# Patient Record
Sex: Male | Born: 1935 | Race: White | Hispanic: No | Marital: Married | Smoking: Former smoker
Health system: Southern US, Community
[De-identification: ages and names within clinical notes are randomized; demographics above are authoritative.]

## PROBLEM LIST (undated history)

## (undated) DIAGNOSIS — I4891 Unspecified atrial fibrillation: Secondary | ICD-10-CM

## (undated) DIAGNOSIS — C801 Malignant (primary) neoplasm, unspecified: Secondary | ICD-10-CM

## (undated) DIAGNOSIS — I10 Essential (primary) hypertension: Secondary | ICD-10-CM

## (undated) DIAGNOSIS — I499 Cardiac arrhythmia, unspecified: Secondary | ICD-10-CM

## (undated) DIAGNOSIS — K219 Gastro-esophageal reflux disease without esophagitis: Secondary | ICD-10-CM

## (undated) HISTORY — DX: Unspecified atrial fibrillation: I48.91

## (undated) HISTORY — PX: CAROTID ENDARTERECTOMY: SUR193

## (undated) HISTORY — PX: JOINT REPLACEMENT: SHX530

---

## 2004-05-11 ENCOUNTER — Ambulatory Visit: Payer: Self-pay | Admitting: General Surgery

## 2004-07-11 ENCOUNTER — Ambulatory Visit: Payer: Self-pay | Admitting: Unknown Physician Specialty

## 2004-07-13 ENCOUNTER — Ambulatory Visit: Payer: Self-pay | Admitting: Unknown Physician Specialty

## 2006-08-26 ENCOUNTER — Ambulatory Visit: Payer: Self-pay | Admitting: Urology

## 2006-08-29 ENCOUNTER — Ambulatory Visit: Payer: Self-pay | Admitting: Urology

## 2006-09-12 ENCOUNTER — Ambulatory Visit: Payer: Self-pay | Admitting: Radiation Oncology

## 2006-10-08 ENCOUNTER — Ambulatory Visit: Payer: Self-pay | Admitting: Radiation Oncology

## 2006-11-07 ENCOUNTER — Ambulatory Visit: Payer: Self-pay | Admitting: Radiation Oncology

## 2006-12-08 ENCOUNTER — Ambulatory Visit: Payer: Self-pay | Admitting: Radiation Oncology

## 2007-01-07 ENCOUNTER — Ambulatory Visit: Payer: Self-pay | Admitting: Radiation Oncology

## 2007-02-07 ENCOUNTER — Ambulatory Visit: Payer: Self-pay | Admitting: Radiation Oncology

## 2007-05-10 ENCOUNTER — Ambulatory Visit: Payer: Self-pay | Admitting: Radiation Oncology

## 2007-05-19 ENCOUNTER — Ambulatory Visit: Payer: Self-pay | Admitting: Radiation Oncology

## 2007-06-09 ENCOUNTER — Ambulatory Visit: Payer: Self-pay | Admitting: Radiation Oncology

## 2007-07-11 ENCOUNTER — Ambulatory Visit: Payer: Self-pay | Admitting: Radiation Oncology

## 2007-11-07 ENCOUNTER — Ambulatory Visit: Payer: Self-pay | Admitting: Radiation Oncology

## 2007-11-20 ENCOUNTER — Ambulatory Visit: Payer: Self-pay | Admitting: Radiation Oncology

## 2007-12-08 ENCOUNTER — Ambulatory Visit: Payer: Self-pay | Admitting: Radiation Oncology

## 2008-02-07 ENCOUNTER — Ambulatory Visit: Payer: Self-pay | Admitting: Radiation Oncology

## 2009-03-09 ENCOUNTER — Ambulatory Visit: Payer: Self-pay | Admitting: Radiation Oncology

## 2009-07-20 ENCOUNTER — Ambulatory Visit: Payer: Self-pay | Admitting: Unknown Physician Specialty

## 2009-07-21 ENCOUNTER — Inpatient Hospital Stay: Payer: Self-pay | Admitting: Unknown Physician Specialty

## 2011-09-10 ENCOUNTER — Ambulatory Visit: Payer: Self-pay | Admitting: Vascular Surgery

## 2011-09-10 LAB — CREATININE, SERUM
Creatinine: 1.16 mg/dL (ref 0.60–1.30)
EGFR (African American): >60
EGFR (Non-African Amer.): >60

## 2011-10-04 ENCOUNTER — Ambulatory Visit: Payer: Self-pay | Admitting: Vascular Surgery

## 2011-10-04 LAB — BASIC METABOLIC PANEL
BUN: 16 mg/dL (ref 7–18)
Calcium, Total: 8.7 mg/dL (ref 8.5–10.1)
Co2: 26 mmol/L (ref 21–32)
Creatinine: 1.03 mg/dL (ref 0.60–1.30)
EGFR (Non-African Amer.): >60
Glucose: 96 mg/dL (ref 65–99)
Osmolality: 282 (ref 275–301)
Potassium: 3.8 mmol/L (ref 3.5–5.1)

## 2011-10-04 LAB — CBC
HCT: 37.5 % — ABNORMAL LOW (ref 40.0–52.0)
MCH: 33.4 pg (ref 26.0–34.0)
MCV: 99 fL (ref 80–100)
Platelet: 191 10*3/uL (ref 150–440)

## 2011-10-12 ENCOUNTER — Inpatient Hospital Stay: Payer: Self-pay | Admitting: Vascular Surgery

## 2011-10-13 LAB — CBC WITH DIFFERENTIAL/PLATELET
Eosinophil %: 0.7 %
HCT: 29.3 % — ABNORMAL LOW (ref 40.0–52.0)
Lymphocyte %: 12.7 %
MCH: 33.2 pg (ref 26.0–34.0)
Monocyte #: 0.4 10*3/uL (ref 0.0–0.7)
Neutrophil #: 5.8 10*3/uL (ref 1.4–6.5)
Platelet: 152 10*3/uL (ref 150–440)
RBC: 2.95 10*6/uL — ABNORMAL LOW (ref 4.40–5.90)
RDW: 13.9 % (ref 11.5–14.5)
WBC: 7.2 10*3/uL (ref 3.8–10.6)

## 2011-10-13 LAB — BASIC METABOLIC PANEL
Anion Gap: 8 (ref 7–16)
Chloride: 105 mmol/L (ref 98–107)
Co2: 26 mmol/L (ref 21–32)
Creatinine: 1.04 mg/dL (ref 0.60–1.30)
EGFR (Non-African Amer.): >60
Sodium: 139 mmol/L (ref 136–145)

## 2011-10-17 LAB — PATHOLOGY REPORT

## 2011-11-27 ENCOUNTER — Observation Stay: Payer: Self-pay | Admitting: Internal Medicine

## 2011-11-27 LAB — COMPREHENSIVE METABOLIC PANEL
Alkaline Phosphatase: 80 U/L (ref 50–136)
Anion Gap: 9 (ref 7–16)
Calcium, Total: 8.8 mg/dL (ref 8.5–10.1)
Chloride: 105 mmol/L (ref 98–107)
Creatinine: 1.09 mg/dL (ref 0.60–1.30)
EGFR (Non-African Amer.): >60
Glucose: 129 mg/dL — ABNORMAL HIGH (ref 65–99)
Osmolality: 281 (ref 275–301)
Potassium: 3.6 mmol/L (ref 3.5–5.1)
SGOT(AST): 26 U/L (ref 15–37)
SGPT (ALT): 18 U/L
Sodium: 139 mmol/L (ref 136–145)
Total Protein: 7.4 g/dL (ref 6.4–8.2)

## 2011-11-27 LAB — CBC
MCV: 98 fL (ref 80–100)
Platelet: 201 10*3/uL (ref 150–440)
WBC: 5.9 10*3/uL (ref 3.8–10.6)

## 2011-11-27 LAB — CK TOTAL AND CKMB (NOT AT ARMC)
CK, Total: 60 U/L (ref 35–232)
CK-MB: 1.4 ng/mL (ref 0.5–3.6)

## 2011-12-05 ENCOUNTER — Emergency Department: Payer: Self-pay | Admitting: Emergency Medicine

## 2011-12-05 LAB — URINALYSIS, COMPLETE
Bilirubin,UR: NEGATIVE
Glucose,UR: NEGATIVE mg/dL (ref 0–75)
Ketone: NEGATIVE
Leukocyte Esterase: NEGATIVE
RBC,UR: 1 /HPF (ref 0–5)
Squamous Epithelial: NONE SEEN

## 2011-12-05 LAB — COMPREHENSIVE METABOLIC PANEL
Albumin: 3.9 g/dL (ref 3.4–5.0)
Alkaline Phosphatase: 79 U/L (ref 50–136)
Anion Gap: 12 (ref 7–16)
Bilirubin,Total: 0.4 mg/dL (ref 0.2–1.0)
Chloride: 105 mmol/L (ref 98–107)
Co2: 24 mmol/L (ref 21–32)
Creatinine: 1.12 mg/dL (ref 0.60–1.30)
Glucose: 128 mg/dL — ABNORMAL HIGH (ref 65–99)
Potassium: 3.4 mmol/L — ABNORMAL LOW (ref 3.5–5.1)
Sodium: 141 mmol/L (ref 136–145)

## 2011-12-05 LAB — CBC
HCT: 36 % — ABNORMAL LOW (ref 40.0–52.0)
HGB: 12.3 g/dL — ABNORMAL LOW (ref 13.0–18.0)
MCV: 97 fL (ref 80–100)
Platelet: 194 10*3/uL (ref 150–440)
RDW: 13.8 % (ref 11.5–14.5)
WBC: 6.4 10*3/uL (ref 3.8–10.6)

## 2011-12-05 LAB — PROTIME-INR: INR: 0.9

## 2012-02-19 ENCOUNTER — Ambulatory Visit: Payer: Self-pay | Admitting: Vascular Surgery

## 2012-02-19 LAB — BASIC METABOLIC PANEL
BUN: 16 mg/dL (ref 7–18)
Calcium, Total: 8.9 mg/dL (ref 8.5–10.1)
EGFR (African American): >60
Glucose: 95 mg/dL (ref 65–99)
Potassium: 4.2 mmol/L (ref 3.5–5.1)
Sodium: 143 mmol/L (ref 136–145)

## 2012-04-07 ENCOUNTER — Inpatient Hospital Stay: Payer: Self-pay | Admitting: Internal Medicine

## 2012-04-07 LAB — URINALYSIS, COMPLETE
Bacteria: NONE SEEN
Bilirubin,UR: NEGATIVE
Blood: NEGATIVE
Glucose,UR: NEGATIVE mg/dL (ref 0–75)
Ketone: NEGATIVE
Specific Gravity: 1.042 (ref 1.003–1.030)
Squamous Epithelial: NONE SEEN
WBC UR: <1 /HPF (ref 0–5)

## 2012-04-07 LAB — BASIC METABOLIC PANEL
BUN: 23 mg/dL — ABNORMAL HIGH (ref 7–18)
Calcium, Total: 8.8 mg/dL (ref 8.5–10.1)
Chloride: 106 mmol/L (ref 98–107)
Creatinine: 1.25 mg/dL (ref 0.60–1.30)
EGFR (Non-African Amer.): 53 — ABNORMAL LOW
Glucose: 134 mg/dL — ABNORMAL HIGH (ref 65–99)
Osmolality: 287 (ref 275–301)
Potassium: 4.2 mmol/L (ref 3.5–5.1)
Sodium: 141 mmol/L (ref 136–145)

## 2012-04-07 LAB — CBC
HCT: 30.1 % — ABNORMAL LOW (ref 40.0–52.0)
HGB: 10.4 g/dL — ABNORMAL LOW (ref 13.0–18.0)
MCH: 32.7 pg (ref 26.0–34.0)
MCHC: 34.5 g/dL (ref 32.0–36.0)
RBC: 3.18 10*6/uL — ABNORMAL LOW (ref 4.40–5.90)

## 2012-04-07 LAB — TROPONIN I: Troponin-I: <0.02 ng/mL

## 2012-04-07 LAB — PROTIME-INR
INR: 1
Prothrombin Time: 13.4 secs (ref 11.5–14.7)

## 2012-04-08 LAB — CBC WITH DIFFERENTIAL/PLATELET
Basophil #: 0 10*3/uL (ref 0.0–0.1)
Basophil %: 0.3 %
Eosinophil #: 0 10*3/uL (ref 0.0–0.7)
Eosinophil %: 0.7 %
HCT: 23.4 % — ABNORMAL LOW (ref 40.0–52.0)
HGB: 7.9 g/dL — ABNORMAL LOW (ref 13.0–18.0)
Lymphocyte #: 0.9 10*3/uL — ABNORMAL LOW (ref 1.0–3.6)
MCH: 32 pg (ref 26.0–34.0)
MCV: 94 fL (ref 80–100)
Monocyte #: 0.5 x10 3/mm (ref 0.2–1.0)
Monocyte %: 8.7 %
Neutrophil #: 4.4 10*3/uL (ref 1.4–6.5)
RBC: 2.48 10*6/uL — ABNORMAL LOW (ref 4.40–5.90)
RDW: 13.1 % (ref 11.5–14.5)
WBC: 5.9 10*3/uL (ref 3.8–10.6)

## 2012-04-08 LAB — TROPONIN I
Troponin-I: <0.02 ng/mL
Troponin-I: <0.02 ng/mL

## 2012-04-08 LAB — BASIC METABOLIC PANEL
Anion Gap: 6 — ABNORMAL LOW (ref 7–16)
BUN: 17 mg/dL (ref 7–18)
Calcium, Total: 8.5 mg/dL (ref 8.5–10.1)
Co2: 25 mmol/L (ref 21–32)
Creatinine: 1.07 mg/dL (ref 0.60–1.30)
EGFR (African American): >60
EGFR (Non-African Amer.): >60
Osmolality: 283 (ref 275–301)

## 2012-04-09 LAB — CBC WITH DIFFERENTIAL/PLATELET
Basophil #: 0 10*3/uL (ref 0.0–0.1)
Basophil %: 0.4 %
Eosinophil #: 0.1 10*3/uL (ref 0.0–0.7)
HCT: 25.8 % — ABNORMAL LOW (ref 40.0–52.0)
HGB: 9.1 g/dL — ABNORMAL LOW (ref 13.0–18.0)
Lymphocyte %: 18.4 %
MCH: 33 pg (ref 26.0–34.0)
MCHC: 35.1 g/dL (ref 32.0–36.0)
MCV: 94 fL (ref 80–100)
Monocyte #: 0.6 x10 3/mm (ref 0.2–1.0)
Monocyte %: 8.5 %
Neutrophil #: 4.7 10*3/uL (ref 1.4–6.5)
Neutrophil %: 71.4 %
Platelet: 166 10*3/uL (ref 150–440)
RDW: 13.5 % (ref 11.5–14.5)
WBC: 6.5 10*3/uL (ref 3.8–10.6)

## 2014-03-28 ENCOUNTER — Emergency Department: Payer: Self-pay | Admitting: Emergency Medicine

## 2014-03-28 LAB — COMPREHENSIVE METABOLIC PANEL
ALT: 19 U/L
AST: 20 U/L (ref 15–37)
Albumin: 4 g/dL (ref 3.4–5.0)
Alkaline Phosphatase: 74 U/L
Anion Gap: 7 (ref 7–16)
BUN: 15 mg/dL (ref 7–18)
Bilirubin,Total: 0.4 mg/dL (ref 0.2–1.0)
CALCIUM: 8.9 mg/dL (ref 8.5–10.1)
CO2: 24 mmol/L (ref 21–32)
Chloride: 109 mmol/L — ABNORMAL HIGH (ref 98–107)
Creatinine: 1.41 mg/dL — ABNORMAL HIGH (ref 0.60–1.30)
EGFR (African American): 52 — ABNORMAL LOW
GFR CALC NON AF AMER: 45 — AB
Glucose: 102 mg/dL — ABNORMAL HIGH (ref 65–99)
Osmolality: 280 (ref 275–301)
Potassium: 4 mmol/L (ref 3.5–5.1)
SODIUM: 140 mmol/L (ref 136–145)
Total Protein: 7 g/dL (ref 6.4–8.2)

## 2014-03-28 LAB — CBC
HCT: 34.3 % — ABNORMAL LOW (ref 40.0–52.0)
HGB: 11.6 g/dL — ABNORMAL LOW (ref 13.0–18.0)
MCH: 34.5 pg — ABNORMAL HIGH (ref 26.0–34.0)
MCHC: 33.8 g/dL (ref 32.0–36.0)
MCV: 102 fL — AB (ref 80–100)
PLATELETS: 171 10*3/uL (ref 150–440)
RBC: 3.36 10*6/uL — ABNORMAL LOW (ref 4.40–5.90)
RDW: 14.2 % (ref 11.5–14.5)
WBC: 5 10*3/uL (ref 3.8–10.6)

## 2014-03-28 LAB — TROPONIN I: Troponin-I: <0.02 ng/mL

## 2014-10-26 NOTE — Op Note (Signed)
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   PROCEDURES PERFORMED:  1. Abdominal aortogram.  2. Right lower extremity distal runoff.  3. Percutaneous transluminal angioplasty and stent placement, right popliteal artery.  4. Percutaneous transluminal angioplasty and stent placement, right external iliac artery.   SURGEON: Katha Cabal, M.D.   SEDATION: Versed 4 mg plus fentanyl 150 mcg administered IV. Continuous ECG, pulse oximetry and cardiopulmonary monitoring was performed throughout the entire procedure by the interventional radiology nurse. Total sedation time is approximately one hour.   ACCESS: 6 French sheath left common femoral artery.   FLUORO TIME: 6.3 minutes.   CONTRAST USED: Isovue 80 mL.   INDICATIONS: Mr. Craney is an 81 year old gentleman who presented to the office with increasing pain in his right lower extremity. The risks and benefits for angiography and possible intervention were reviewed, all questions answered, and the patient agrees to proceed.   DESCRIPTION OF PROCEDURE: The patient is taken to special procedures and placed in the supine position. After adequate sedation is achieved, the left groin is prepped and draped in sterile fashion. Ultrasound is placed in a sterile sleeve. Ultrasound is utilized secondary to lack of appropriate landmarks to avoid vascular injury. Under direct ultrasound visualization, the left common femoral artery is noted to be pulsatile indicating patency, image is recorded for the permanent record, and micropuncture needle is used to access the anterior wall. Microwire followed by micro sheath, J-wire  followed by 6 French sheath, then 5 French sheath and 5 French pigtail catheter. The pigtail catheter is positioned at T12 and AP projection of the aorta is obtained. Pigtail catheter is repositioned to just above the aortic bifurcation. Bilateral oblique views of the pelvis are obtained. A stiff angled Glidewire is then used to cross the aortic bifurcation and the catheter is advanced down into the SFA. Distal runoff is then obtained.   After review of the images, wire is reintroduced and a 6 [place] Ansel is exchanged for the 5 [place] sheath.  Ansel sheath is then positioned with its tip in the SFA.   Wire is then negotiated across the lesion, in the popliteal, after adequate lateral view has been obtained with the patient in the frog-leg position. A 5 mm balloon is inflated, but there is very little change in this calcific plaque and therefore a 6 x 60 LifeStent is deployed and then post dilated to 5 mm. Result looks significantly improved. Distal runoff is preserved. The sheath is then pulled back and the external iliac lesion noted on the initial images, approximately 60% in the mid external iliac, is treated by deploying an 8 x 4 LIFESTAR stent and post dilating it to 7 mm. Again, the result is  excellent. After review of the images, the sheath is pulled back into the left iliac system, oblique view is obtained, a StarClose device is deployed, and there are no immediate complications.   INTERPRETATION: The aorta and iliac system is patent. The only hemodynamically significant stenosis noted is in the right external iliac and is approximately a 60 to 65% stenosis. Common femoral and profunda femoris on the right are widely patent. Superficial femoral artery is diseased but no hemodynamically significant stenosis is noted. In  the mid popliteal just behind the knee prosthesis there is a focal calcific lesion identified on ultrasound. It is hemodynamically significant, approximately 70 to 80%. Distal runoff  is single vessel via the peroneal. Anterior tibial is patent down about two thirds of the way and quite small.              Following angioplasty and stent placement as described above, there is patency of the popliteal as well as the iliac lesion.   SUMMARY: Successful recanalization of the right lower extremity as described. ____________________________ Katha Cabal, MD ggs:slb D: 02/19/2012 19:57:47 ET T: 02/20/2012 09:15:26 ET JOB#: [id number]  cc: Katha Cabal, MD, <Dictator> Adin Hector, MD Katha Cabal MD ELECTRONICALLY SIGNED 03/04/2012 10:48

## 2014-10-26 NOTE — H&P (Signed)
PATIENT NAME:  [name], KAZMI MR#:  [id number] DATE OF BIRTH:  Sep 09, 1925  DATE OF ADMISSION:  04/07/2012  CHIEF COMPLAINT: Syncope.   HISTORY OF PRESENT ILLNESS: 81 year old male with history of peripheral vascular disease, for which he has been on aspirin and Plavix, also history of hypertension. He was in his usual state of health today until he fell in the shower, he landed on his right side. Over the course of about three hours he began becoming lightheaded, and actually had several episodes of brief syncope when he was trying to stand. He was better once he was lying down. He presented to the office and his initial blood pressures were 90 systolic, falling rapidly to 50 and 60 systolic. Evaluation revealed a very large right-sided hematoma. He was moved to the Emergency Room, and underwent CT scan which confirms large hematoma, fortunately nothing intra-abdominal. His hemoglobin is 10, down from hemoglobin of 12. He is admitted now with syncope which appears to be secondary to acute blood loss anemia secondary to trauma and bleeding related to being on blood thinners. We expect his hemoglobin level likely will fall further overnight as he equilibrates. Fortunately with fluid resuscitation in the Emergency Room his symptoms have improved.   PAST MEDICAL HISTORY:  1. History of chronic anemia and B12 deficiency, baseline hemoglobin approximately 12.  2. Hyperlipidemia.  3. Gastroesophageal reflux disease.  4. History of prostate cancer.  5. Osteoarthritis.  6. Hypertension.  7. Peripheral vascular disease with prior stroke May 2013. Patient with significant right carotid disease, being followed by vascular surgery.  8. History of right knee replacement.  9. History of skin cancer resection.  10. History of arthroscopic surgery.  11. History of left carotid endarterectomy earlier 2013.   ALLERGIES: No known drug allergies.    MEDICATIONS:  1. Multivitamin 1 p.o. daily.  2. Vitamin D3  1000 units p.o. daily.  3. Terazosin 2 mg p.o. at bedtime.  4. Glucosamine 1 p.o. b.i.d.  5. Lovastatin 40 mg p.o. at bedtime.  6. Plavix 75 mg p.o. daily.  7. Aspirin 81 mg p.o. daily.  8. Amlodipine 5 mg p.o. daily. 9. Vitamin B12 1000 mcg p.o. daily.  10. Pantoprazole 40 mg p.o. daily.   SOCIAL HISTORY: No tobacco. One drink of alcohol daily.   FAMILY HISTORY: Stroke, coronary artery disease, intercerebral hemorrhage.   REVIEW OF SYSTEMS: Please see history of present illness. Denies fevers, chills. No chest pain or shortness of breath. No palpitations. Feels generally cold now, but felt better earlier today. Denies focal numbness or weakness. Remainder of complete review of systems is negative.   PHYSICAL EXAMINATION:  VITAL SIGNS: Pulse 59, blood pressure 99/50 initially, saturation 98% on room air, weight 173 pounds.   GENERAL: Elderly male, appears uncomfortable.   EYES: Conjunctivae pale. Pupils are round, reactive to light. Lids pale.   EARS, NOSE, AND THROAT: External examination unremarkable. The oropharynx is slightly dry without lesions.   NECK: Postoperative changes on the left. Trachea midline. No thyromegaly.   CARDIOVASCULAR: Regular rate and rhythm with occasional ectopy. No gallops, rubs, or significant murmurs. Carotid and radial pulses are 2+. Distal pulses 1+.   LUNGS: Clear bilaterally. No wheeze or retraction.   ABDOMEN: Soft, nontender, nondistended. Positive bowel sounds.   BACK: Large hematoma noted on the right flank.   SKIN: As above. No other significant rashes or nodules.   LYMPH NODES: No cervical or supraclavicular nodes.   MUSCULOSKELETAL: No clubbing or cyanosis. No significant edema.  Distal hair loss. Postoperative changes of the right knee.   NEUROLOGIC: Cranial nerves are intact. Gait cannot be tested.   IMPRESSION AND PLAN:  1. Syncope secondary to acute blood loss anemia. Will follow his hemoglobin and aware that he may transfusion  if his hemoglobin gets down below 8.5. His anticoagulation regimen is complicated by the fact that he had fairly recent stents placed in his legs, however, I think at this point I am not sure that we can continue anticoagulation. I will need to talk to vascular surgery for their thoughts. The risks/benefits were discussed with the patient. Type and screen and crossmatch are active.  2. Hypertension, currently hypotensive secondary to the above. Continue fluid resuscitation. Hold all blood pressure medications at this time.  3. Reflux disease. Continue pantoprazole.  4. B12 deficiency. Will hold off on this in the event that he needs to be transfused. Otherwise will resume his B12 supplements.   ____________________________ Adin Hector, MD bjk:cms D: 04/07/2012 19:05:31 ET T: 04/08/2012 06:50:49 ET JOB#: [id number]  cc: Adin Hector, MD, <Dictator>  Ramonita Lab MD ELECTRONICALLY SIGNED 04/13/2012 17:40

## 2014-10-26 NOTE — Discharge Summary (Signed)
PATIENT NAME:  [name], [name] MR#:  [id number] DATE OF BIRTH:  04-24-1926  DATE OF ADMISSION:  04/07/2012 DATE OF DISCHARGE:  04/09/2012  FINAL DIAGNOSES:  1. Syncope secondary to acute blood loss anemia from fall and right side hematoma.  2. Peripheral vascular disease.  3. History of chronic anemia with B12 deficiency.  4. Hyperlipidemia.  5. Gastroesophageal reflux disease.  6. History of prostate cancer.  7. Osteoarthritis.  8. Hypertension.   HISTORY AND PHYSICAL: Please see dictated admission history and physical.  [place]: The patient was admitted after having an episode of syncope and noted to have significant hypotension. Examination revealed right-sided hematoma. He had fallen earlier that day, landing on the ceramic water retention wall in the shower. He has been on aspirin and Plavix. These medications were held, and this was discussed briefly with vascular surgery, as he had had stenting in his legs performed in August, and they felt comfortable having him off his medications, particularly with acute bleeding. Hemoglobin was followed and fell down below 8, and so he was transfused one unit packed cell. His hemoglobin level came up over 9 and remained stable there. His blood pressure stabilized with use of blood products and fluid resuscitation. He was able to ambulate over 300 feet independently, without further syncope or lightheadedness, although he remains mildly orthostatic. He felt ready to go home, and at this time he will be discharged to home in stable condition with his physical activity up as tolerated. His diet should be 2 gram sodium. We will anticipate him following up in the office in 7 to 10 days and will repeat CBC stat at that time.   DISCHARGE MEDICATIONS:  1. Vitamin D 1000 units p.o. daily. 2. Vitamin B12 1000 mcg p.o. daily.  3. Multivitamin 1 p.o. daily.  4. Lovastatin 40 mg p.o. at bedtime.  5. Enteric-coated aspirin 81 mg daily, to be  started on 04/12/2012; risks/benefits were discussed.  6. Pantoprazole 40 mg p.o. daily.  7. Amlodipine 2.5 mg p.o. daily, dose reduced.  8. Iron sulfate 145 mg p.o. daily, over-the-counter, take with food.   He was given instructions to hold Plavix and to hold terazosin. He should avoid heavy lifting.  ____________________________ Adin Hector, MD bjk:slb D: 04/09/2012 12:38:56 ET T: 04/09/2012 12:47:57 ET JOB#: [id number]  cc: Adin Hector, MD, <Dictator> Ramonita Lab MD ELECTRONICALLY SIGNED 04/13/2012 17:40

## 2014-10-31 NOTE — Op Note (Signed)
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  PROCEDURE PERFORMED:  1. Left carotid endarterectomy with CorMatrix patch angioplasty.  2. Repair of arterial defect with xenograft, CorMatrix patch.   SURGEON: Hortencia Pilar, MD   FIRST ASSISTANT: Real Cons, MD   ANESTHESIA: General by endotracheal intubation.   FLUIDS: Per anesthesia record.   ESTIMATED BLOOD LOSS: 100 mL.   SPECIMEN: Carotid plaque to pathology for permanent section.   INDICATIONS: Mr. Geil is an 81 year old gentleman who presented to the office with duplex ultrasound evidence of critical stenosis of the left internal carotid artery. CT angiography was obtained which confirmed approximately 90% stenosis of the left internal carotid artery, and he is therefore undergoing carotid endarterectomy to prevent stroke. The risk and benefits were reviewed. All questions are answered. The patient agrees to proceed.   DESCRIPTION OF PROCEDURE: The patient is taken to the Operating Room and placed in the supine position. After adequate anesthesia is induced and appropriate invasive monitors are placed, he is positioned with his neck extended slightly and rotated to the right. The neck and chest wall are prepped and draped in a sterile fashion.   A curvilinear incision is created along the anterior margin of the sternocleidomastoid muscle, and the dissection is carried down transecting the platysma with Bovie cautery. The external jugular vein is ligated with 3-0 Vicryl. The common carotid artery is identified at the level of the omohyoid, and the dissection is carried in a craniad direction looping the superior thyroidal and the external carotid artery with Silastic vessel loops; 7000 units of heparin is given  and allowed to circulate for four minutes.   The common, followed by the external, followed by the internal carotid artery is clamped. Arteriotomy is made with an 11 blade and extended. An indwelling Sundt shunt is then placed without difficulty, and flow is re-established to the brain. Endarterectomy is then performed in the distal common bulb and proximal internal carotid arteries under direct visualization. The external carotid artery is treated with eversion technique. Interrupted 7-0 Prolene tacking sutures are used for the distal intimal edge within the internal carotid artery, and 6-0 interrupted Prolene sutures are used to tack the proximal intimal edge in the common carotid artery. A CorMatrix patch is rehydrated on the back table, trimmed to an appropriate bevel, and then applied for repair of the arterial defect using running 6-0 Prolene in a four-quadrant technique. The Sundt shunt was removed, and the suture line is completed. Copious irrigation is performed throughout the repair.   Flow is then re-established first to the external carotid artery and then the internal carotid artery to prevent distal embolization. The suture line is inspected for hemostasis.  Surgicel is placed along the suture line; and subsequently the platysma is reapproximated using running 3-0 Vicryl, and the skin is closed with 4-0 Monocryl subcuticular and then Dermabond is applied.   The patient tolerated the procedure well, and there were no immediate complications. He was awakened in the Operating Room, moving all extremities and obeying simple commands. He is taken to the recovery room in stable condition.  ____________________________ Katha Cabal, MD ggs:cbb D: 10/12/2011 15:23:46 ET T: 10/12/2011 18:30:49 ET JOB#: [id number]  cc: Katha Cabal, MD, <Dictator> Adin Hector, MD Katha Cabal MD ELECTRONICALLY SIGNED  10/17/2011 14:08 

## 2014-10-31 NOTE — Discharge Summary (Signed)
PATIENT NAME:  [name], BARRETTA MR#:  [id number] DATE OF BIRTH:  1926-06-24  DATE OF ADMISSION:  11/27/2011 DATE OF DISCHARGE:  11/29/2011  FINAL DIAGNOSES:  1. Transient ischemic attack secondary to right carotid artery stenosis.  2. Bradycardia.  3. Hyperlipidemia.  4. History of prostate cancer.   HISTORY AND PHYSICAL: Please see dictated admission history and physical.   [place]: The patient was admitted with transient episode of left hand numbness. He had two episodes of difficulty with speech that had occurred at different times over the last several weeks. He was seen by neurology, who was concerned that this represented some decreased flow into some areas in the right brain, and the patient had known right carotid artery stenosis and recently had a left carotid endarterectomy. He underwent ultrasound after vascular surgery saw the patient, which suggested significant stenosis in the right carotid artery, subsequently underwent CT angiogram which showed progression of the stenosis from the examination done several months ago to more than 80%. It is felt likely that this is the source of his symptoms and he may require carotid endarterectomy, however, it was felt that given recent bradycardia that was observed during this hospitalization that he would need cardiology to see him prior to surgery. The patient has been asymptomatic since his admission and wanted to go home, which seems reasonable. He will have a 30 day event monitor placed, anticipating him following up with Dr. Serafina Royals within the next one week. Provided no further cardiac evaluation is needed and no new symptoms are found, he will then follow-up with Dr. Hortencia Pilar to anticipate right carotid endarterectomy in the near future. He is discharged home in stable condition with his physical activity up as tolerated. He should follow a 2 gram sodium, low fat diet. His physical activity should be as tolerated.  Follow-up with Dr. Nehemiah Massed and Dr. Delana Meyer, as noted above.   DISCHARGE MEDICATIONS:  1. Vitamin D 1000 units p.o. daily. 2. Vitamin B12 1000 mcg p.o. daily.  3. Multivitamin 1 p.o. daily.  4. Lovastatin 30 mg p.o. at bedtime. 5. Terazosin 2 mg p.o. at bedtime.  6. Clopidogrel 75 mg p.o. daily.  7. Zantac 150 mg p.o. daily as needed.   NOTE: He has instructions to stop omeprazole and to hold aspirin at this time.  ____________________________ Adin Hector, MD bjk:slb D: 11/29/2011 12:59:02 ET T: 11/29/2011 13:08:14 ET JOB#: [id number]  cc: Adin Hector, MD, <Dictator> Corey Skains, MD Katha Cabal, MD Ramonita Lab MD ELECTRONICALLY SIGNED 12/05/2011 7:43

## 2014-10-31 NOTE — Consult Note (Signed)
PATIENT NAME:  [name], [name] MR#:  [id number] DATE OF BIRTH:  08-11-1925  DATE OF CONSULTATION:  10/13/2011  REFERRING PHYSICIAN:  Hortencia Pilar, MD CONSULTING PHYSICIAN:  Isaias Cowman, MD  PRIMARY CARE PHYSICIAN: Ramonita Lab, MD  CHIEF COMPLAINT: I had a blockage in my neck.  HISTORY OF PRESENT ILLNESS: The patient is an 81 year old gentleman who underwent left carotid endarterectomy for 90% stenosis of the left internal coronary artery. The patient had an unremarkable postsurgical course with the exception of episodes of bradycardia on telemetry. Review of telemetry revealed predominant sinus rhythm with first degree AV block with episodic type I second-degree AV block. There was one brief episode of 2: conduction. The patient has remained asymptomatic. He denies a prior history of presyncope or syncope. The patient is not currently taking any AV nodal blocking agents.   PAST MEDICAL HISTORY:  1. Hypertension.  2. Coronary artery disease status post prior myocardial infarction.  3. Hyperlipidemia.  4. Moderately reduced left ventricular function. 5. History of prostate cancer.   MEDICATIONS:  1. Aspirin 81 mg daily.  2. Terazosin 2 mg daily.  3. Lovastatin 80 mg daily.  4. Chondroitin glucosamine 2 tablets daily.  5. Multivitamin 1 daily.  6. Omeprazole 20 mg every other day.   SOCIAL HISTORY: The patient is married and has remote tobacco abuse history.   FAMILY HISTORY: Noncontributory.  REVIEW OF SYSTEMS: CONSTITUTIONAL: No fever or chills. EYES: No blurry vision. EARS: No hearing loss. RESPIRATORY: No shortness of breath. CARDIOVASCULAR: No chest pain, presyncope, or syncope. GASTROINTESTINAL: No nausea, vomiting, diarrhea, or constipation. GU: No dysuria or hematuria. ENDOCRINE: No polyuria or polydipsia. MUSCULOSKELETAL: No arthralgias or myalgias. NEUROLOGICAL: No focal muscle weakness or numbness. PSYCHOLOGICAL: No depression or anxiety.   PHYSICAL EXAMINATION:    VITAL SIGNS: Blood pressure 116/60, pulse 70, respirations 14, pulse oximetry 99%.   HEENT: Pupils equal and reactive to light and accommodation.   NECK: Supple without thyromegaly.   LUNGS: Clear.   HEART: Normal jugular venous pressure. The patient has a surgical scar status post left carotid endarterectomy. Regular rate and rhythm. Normal S1 and S2. No appreciable gallop, murmur, or rub.   ABDOMEN: Soft and nontender without hepatosplenomegaly.   EXTREMITIES: No cyanosis, clubbing, or edema. Pulses were intact bilaterally.   MUSCULOSKELETAL: Normal muscle tone.   NEUROLOGIC: The patient was alert and oriented x3. Motor and sensory both grossly intact.   IMPRESSION: This is an 81 year old gentleman status post successful left carotid endarterectomy with brief episodes of type I second-degree AV block. The patient has remained clinically hemodynamically stable.   RECOMMENDATIONS:  1. Continue present medications.  2. No further cardiac work-up at this time.     3. Follow up with Dr. Nehemiah Massed next week at which time he may consider 24 to 48 hour Holter monitor. ____________________________ Isaias Cowman, MD ap:slb D: 10/13/2011 10:00:30 ET T: 10/13/2011 13:21:29 ET JOB#: [id number]  cc: Isaias Cowman, MD, <Dictator> Isaias Cowman MD ELECTRONICALLY SIGNED 10/24/2011 8:46

## 2014-10-31 NOTE — Consult Note (Signed)
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   Patient was seen by consultant.   Consult note dictated.   Comments: REC  Agree with current therapy, no further w/u at this time, f/u Dr Nehemiah Massed next week, consider 24-48h Holter.  Electronic Signatures: Isaias Cowman (MD)  (Signed 06-Apr-13 10:02)  Authored: Brief Consult Note   Last Updated: 06-Apr-13 10:02 by Isaias Cowman (MD)
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
## 2014-10-31 NOTE — H&P (Signed)
PATIENT NAME:  [name], [name] MR#:  [id number] DATE OF BIRTH:  06/12/1926  DATE OF ADMISSION:  11/27/2011  PRIMARY CARE PHYSICIAN: Tama High, III, MD   CHIEF COMPLAINT: Left arm numbness.   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 The patient was in [place] last Thursday when he had a brief episode of inability to coordinate his words which lasted about 15 to 20 minutes.  No additional symptoms were noted. Today this morning around 11:30, he was at home when he had left hand numbness from his wrist to his fingers which lasted about 15 minutes, so he came here for further evaluation. CT of the head was negative for acute intracranial hemorrhage or cerebrovascular accident. No other neurological deficits are noted.   REVIEW OF SYSTEMS: CONSTITUTIONAL: No fever, fatigue or weakness. EYES: No blurred or double vision.  ENT: No ear pain, hearing loss or seasonal allergies. No postnasal drip. RESPIRATORY: No cough, wheezing, hemoptysis, dyspnea, asthma. CARDIOVASCULAR: No chest pain or palpitations, syncope, orthopnea, edema, arrhythmia, dyspnea on exertion. GASTROINTESTINAL: No nausea, vomiting, diarrhea, abdominal pain, melena, or ulcers. GENITOURINARY: No dysuria or hematuria. ENDOCRINE: Positive nocturia. No heat or cold intolerance. HEMATOLOGIC/LYMPHATIC: No anemia, bleeding or swollen glands. SKIN: No rash or lesions. MUSCULOSKELETAL: No limited activity. NEUROLOGICAL:   No history of cerebrovascular accident, transient ischemic attacks or seizures. PSYCHIATRIC: No history of anxiety or depression.   PAST MEDICAL HISTORY:  1. Hyperlipidemia.  2. Prostate cancer, status post XRT.   MEDICATIONS:  1. Tylenol 500 mg, 1-1/2 tablets at bedtime.  2. Aspirin 81 mg daily.  3. Glucosamine chondroitin b.i.d.  4. Lovastatin 20 mg, 1-1/2  tablets at bedtime.  5. Multivitamin 1 tablet daily. 6. Omeprazole 20 mg, 1/2 tablet every other day in the morning.  7. Terazosin 2 mg daily.  8. Vitamin B12, 1000 mcg daily.  9. Vitamin D 3000 international units daily.   ALLERGIES: No known drug allergies.   PAST SURGICAL HISTORY:  1. Left CEA about six weeks ago with Dr. Delana Meyer.  2. Right knee replacement.   FAMILY HISTORY: Positive for CVA in his father. His mom died at an early age secondary to motor vehicle accident.   SOCIAL HISTORY: He drinks 1 ounce of bourbon a day for health issues. No smoking or IV drug use.   LABORATORY, DIAGNOSTIC AND RADIOLOGICAL DATA:  Sodium 139, potassium 3.6, chloride 105, bicarbonate 25, BUN 18, creatinine 1.09, glucose 129, calcium 8.8, bilirubin 0.3, alkaline phosphatase 80, ALT 18, AST 26, total protein 7.4, albumin 3.9. CK 60, CPK MB 1.4. White blood cells 3.9, hemoglobin 12, hematocrit 36, platelets 201.  Troponin less than 0.02.  CT of the head showed no acute intracranial hemorrhage or cerebrovascular accident.  EKG: Sinus rhythm with a first-degree AV block.   ASSESSMENT AND PLAN: An 80 year old male who had an episode last week of inability to coordinate his words and then again today with some left hand numbness, rule out a transient ischemic attack.   Transient ischemic attack: Symptoms sound like they could be related to a transient ischemic attack. CT of the head is negative. We will order an MRI. He may benefit from an echo at some point. He had a recent CTA, so we will not order Dopplers. The CTA showed 85% occlusion in the left internal carotid artery and about 75%  in the right. I will increase aspirin to 325 mg daily. We will continue with lovastatin and neuro checks every 4 hours. At this time since his symptoms have resolved., we do not need a Physical Therapy consult.   CODE STATUS: The patient is a DO NOT RESUSCITATE status.   TIME SPENT: Approximately 55 minutes.    ____________________________ Donell Beers. Benjie Karvonen, MD spm:cbb D: 11/27/2011 17:47:34 ET T: 11/27/2011 18:07:49 ET JOB#: [id number]  cc: Fatoumata Albaugh P. Benjie Karvonen, MD, <Dictator> Adin Hector, MD Donell Beers Wentworth Edelen MD ELECTRONICALLY SIGNED 11/28/2011 20:48

## 2015-07-13 DIAGNOSIS — I480 Paroxysmal atrial fibrillation: Secondary | ICD-10-CM | POA: Diagnosis not present

## 2015-08-04 DIAGNOSIS — I480 Paroxysmal atrial fibrillation: Secondary | ICD-10-CM | POA: Diagnosis not present

## 2015-08-11 DIAGNOSIS — D519 Vitamin B12 deficiency anemia, unspecified: Secondary | ICD-10-CM | POA: Diagnosis not present

## 2015-08-11 DIAGNOSIS — I10 Essential (primary) hypertension: Secondary | ICD-10-CM | POA: Diagnosis not present

## 2015-08-11 DIAGNOSIS — C61 Malignant neoplasm of prostate: Secondary | ICD-10-CM | POA: Diagnosis not present

## 2015-08-17 DIAGNOSIS — I480 Paroxysmal atrial fibrillation: Secondary | ICD-10-CM | POA: Diagnosis not present

## 2015-08-18 DIAGNOSIS — I482 Chronic atrial fibrillation: Secondary | ICD-10-CM | POA: Diagnosis not present

## 2015-08-18 DIAGNOSIS — D519 Vitamin B12 deficiency anemia, unspecified: Secondary | ICD-10-CM | POA: Diagnosis not present

## 2015-08-18 DIAGNOSIS — I739 Peripheral vascular disease, unspecified: Secondary | ICD-10-CM | POA: Diagnosis not present

## 2015-08-18 DIAGNOSIS — N183 Chronic kidney disease, stage 3 (moderate): Secondary | ICD-10-CM | POA: Diagnosis not present

## 2015-08-18 DIAGNOSIS — K219 Gastro-esophageal reflux disease without esophagitis: Secondary | ICD-10-CM | POA: Diagnosis not present

## 2015-08-18 DIAGNOSIS — I10 Essential (primary) hypertension: Secondary | ICD-10-CM | POA: Diagnosis not present

## 2015-08-18 DIAGNOSIS — E782 Mixed hyperlipidemia: Secondary | ICD-10-CM | POA: Diagnosis not present

## 2015-08-18 DIAGNOSIS — D692 Other nonthrombocytopenic purpura: Secondary | ICD-10-CM | POA: Diagnosis not present

## 2015-08-23 DIAGNOSIS — H2513 Age-related nuclear cataract, bilateral: Secondary | ICD-10-CM | POA: Diagnosis not present

## 2015-09-05 DIAGNOSIS — I4891 Unspecified atrial fibrillation: Secondary | ICD-10-CM | POA: Diagnosis not present

## 2015-09-12 DIAGNOSIS — E785 Hyperlipidemia, unspecified: Secondary | ICD-10-CM | POA: Diagnosis not present

## 2015-09-12 DIAGNOSIS — I6523 Occlusion and stenosis of bilateral carotid arteries: Secondary | ICD-10-CM | POA: Diagnosis not present

## 2015-09-12 DIAGNOSIS — I70213 Atherosclerosis of native arteries of extremities with intermittent claudication, bilateral legs: Secondary | ICD-10-CM | POA: Diagnosis not present

## 2015-09-12 DIAGNOSIS — I10 Essential (primary) hypertension: Secondary | ICD-10-CM | POA: Diagnosis not present

## 2015-09-12 DIAGNOSIS — I739 Peripheral vascular disease, unspecified: Secondary | ICD-10-CM | POA: Diagnosis not present

## 2015-09-12 DIAGNOSIS — I6529 Occlusion and stenosis of unspecified carotid artery: Secondary | ICD-10-CM | POA: Diagnosis not present

## 2015-09-26 DIAGNOSIS — H2513 Age-related nuclear cataract, bilateral: Secondary | ICD-10-CM | POA: Diagnosis not present

## 2015-09-28 ENCOUNTER — Encounter: Payer: Self-pay | Admitting: *Deleted

## 2015-10-03 DIAGNOSIS — I4891 Unspecified atrial fibrillation: Secondary | ICD-10-CM | POA: Diagnosis not present

## 2015-10-11 DIAGNOSIS — I4891 Unspecified atrial fibrillation: Secondary | ICD-10-CM | POA: Diagnosis not present

## 2015-10-11 DIAGNOSIS — I739 Peripheral vascular disease, unspecified: Secondary | ICD-10-CM | POA: Diagnosis not present

## 2015-10-11 DIAGNOSIS — E782 Mixed hyperlipidemia: Secondary | ICD-10-CM | POA: Diagnosis not present

## 2015-10-13 ENCOUNTER — Ambulatory Visit: Payer: PPO | Admitting: Anesthesiology

## 2015-10-13 ENCOUNTER — Encounter
Admission: RE | Disposition: A | Discharge status: Home / Self Care | Payer: Self-pay | Source: Ambulatory Visit | Attending: Ophthalmology

## 2015-10-13 ENCOUNTER — Ambulatory Visit
Admission: RE | Admit: 2015-10-13 | Discharge: 2015-10-13 | Disposition: A | Discharge status: Home / Self Care | Payer: PPO | Source: Ambulatory Visit | Attending: Ophthalmology | Admitting: Ophthalmology

## 2015-10-13 ENCOUNTER — Encounter: Payer: Self-pay | Admitting: *Deleted

## 2015-10-13 DIAGNOSIS — I4891 Unspecified atrial fibrillation: Secondary | ICD-10-CM | POA: Diagnosis not present

## 2015-10-13 DIAGNOSIS — I10 Essential (primary) hypertension: Secondary | ICD-10-CM | POA: Insufficient documentation

## 2015-10-13 DIAGNOSIS — E7800 Pure hypercholesterolemia, unspecified: Secondary | ICD-10-CM | POA: Diagnosis not present

## 2015-10-13 DIAGNOSIS — Z87891 Personal history of nicotine dependence: Secondary | ICD-10-CM | POA: Insufficient documentation

## 2015-10-13 DIAGNOSIS — K219 Gastro-esophageal reflux disease without esophagitis: Secondary | ICD-10-CM | POA: Diagnosis not present

## 2015-10-13 DIAGNOSIS — Z96651 Presence of right artificial knee joint: Secondary | ICD-10-CM | POA: Insufficient documentation

## 2015-10-13 DIAGNOSIS — Z8546 Personal history of malignant neoplasm of prostate: Secondary | ICD-10-CM | POA: Diagnosis not present

## 2015-10-13 DIAGNOSIS — H2513 Age-related nuclear cataract, bilateral: Secondary | ICD-10-CM | POA: Diagnosis not present

## 2015-10-13 DIAGNOSIS — H2511 Age-related nuclear cataract, right eye: Secondary | ICD-10-CM | POA: Diagnosis not present

## 2015-10-13 HISTORY — PX: CATARACT EXTRACTION W/PHACO: SHX586

## 2015-10-13 HISTORY — DX: Malignant (primary) neoplasm, unspecified: C80.1

## 2015-10-13 HISTORY — DX: Cardiac arrhythmia, unspecified: I49.9

## 2015-10-13 HISTORY — DX: Gastro-esophageal reflux disease without esophagitis: K21.9

## 2015-10-13 HISTORY — DX: Essential (primary) hypertension: I10

## 2015-10-13 SURGERY — PHACOEMULSIFICATION, CATARACT, WITH IOL INSERTION
Anesthesia: Monitor Anesthesia Care | Site: Eye | Laterality: Right | Wound class: Clean

## 2015-10-13 MED ORDER — NA HYALUR & NA CHOND-NA HYALUR 0.55-0.5 ML IO KIT
PACK | INTRAOCULAR | Status: AC
  Filled 2015-10-13: qty 1.05

## 2015-10-13 MED ORDER — CEFUROXIME OPHTHALMIC INJECTION 1 MG/0.1 ML
INJECTION | OPHTHALMIC | Status: DC | PRN
  Administered 2015-10-13: 0.1 mL via INTRACAMERAL

## 2015-10-13 MED ORDER — TETRACAINE HCL 0.5 % OP SOLN
OPHTHALMIC | Status: AC
  Administered 2015-10-13: 09:00:00
  Filled 2015-10-13: qty 2

## 2015-10-13 MED ORDER — EPINEPHRINE HCL 1 MG/ML IJ SOLN
INTRAMUSCULAR | Status: AC
  Filled 2015-10-13: qty 2

## 2015-10-13 MED ORDER — CARBACHOL 0.01 % IO SOLN
INTRAOCULAR | Status: DC | PRN
  Administered 2015-10-13: 0.5 mL via INTRAOCULAR

## 2015-10-13 MED ORDER — MOXIFLOXACIN HCL 0.5 % OP SOLN
OPHTHALMIC | Status: AC
  Filled 2015-10-13: qty 3

## 2015-10-13 MED ORDER — CEFUROXIME OPHTHALMIC INJECTION 1 MG/0.1 ML
INJECTION | OPHTHALMIC | Status: AC
  Filled 2015-10-13: qty 0.1

## 2015-10-13 MED ORDER — TETRACAINE HCL 0.5 % OP SOLN
1.0000 [drp] | Freq: Once | OPHTHALMIC | Status: AC
  Administered 2015-10-13: 1 [drp] via OPHTHALMIC

## 2015-10-13 MED ORDER — ARMC OPHTHALMIC DILATING GEL
1.0000 "application " | OPHTHALMIC | Status: DC | PRN
  Administered 2015-10-13 (×2): 1 via OPHTHALMIC

## 2015-10-13 MED ORDER — POVIDONE-IODINE 5 % OP SOLN
1.0000 "application " | Freq: Once | OPHTHALMIC | Status: AC
  Administered 2015-10-13: 1 via OPHTHALMIC

## 2015-10-13 MED ORDER — NEOMYCIN-POLYMYXIN-DEXAMETH 3.5-10000-0.1 OP OINT
TOPICAL_OINTMENT | OPHTHALMIC | Status: AC
  Filled 2015-10-13: qty 3.5

## 2015-10-13 MED ORDER — NA HYALUR & NA CHOND-NA HYALUR 0.4-0.35 ML IO KIT
PACK | INTRAOCULAR | Status: DC | PRN
  Administered 2015-10-13: 1.5 mL via INTRAOCULAR

## 2015-10-13 MED ORDER — POVIDONE-IODINE 5 % OP SOLN
OPHTHALMIC | Status: AC
  Filled 2015-10-13: qty 30

## 2015-10-13 MED ORDER — EPINEPHRINE HCL 1 MG/ML IJ SOLN
INTRAOCULAR | Status: DC | PRN
  Administered 2015-10-13: 250 mL via OPHTHALMIC

## 2015-10-13 MED ORDER — NEOMYCIN-POLYMYXIN-DEXAMETH 0.1 % OP OINT
TOPICAL_OINTMENT | OPHTHALMIC | Status: DC | PRN
  Administered 2015-10-13: 1 via OPHTHALMIC

## 2015-10-13 MED ORDER — ARMC OPHTHALMIC DILATING GEL
OPHTHALMIC | Status: AC
  Filled 2015-10-13: qty 0.25

## 2015-10-13 MED ORDER — LIDOCAINE HCL (PF) 4 % IJ SOLN
INTRAMUSCULAR | Status: DC | PRN
  Administered 2015-10-13: 2 mL via OPHTHALMIC

## 2015-10-13 MED ORDER — LIDOCAINE HCL (PF) 4 % IJ SOLN
INTRAMUSCULAR | Status: AC
  Filled 2015-10-13: qty 5

## 2015-10-13 MED ORDER — SODIUM CHLORIDE 0.9 % IV SOLN
INTRAVENOUS | Status: DC
  Administered 2015-10-13: 09:00:00 via INTRAVENOUS

## 2015-10-13 MED ORDER — MIDAZOLAM HCL 2 MG/2ML IJ SOLN
INTRAMUSCULAR | Status: DC | PRN
  Administered 2015-10-13 (×2): 1 mg via INTRAVENOUS

## 2015-10-13 SURGICAL SUPPLY — 23 items
CANNULA ANT/CHMB 27GA (MISCELLANEOUS) ×3 IMPLANT
CUP MEDICINE 2OZ PLAST GRAD ST (MISCELLANEOUS) ×3 IMPLANT
GLOVE BIO SURGEON STRL SZ8 (GLOVE) ×3 IMPLANT
GLOVE BIOGEL M 6.5 STRL (GLOVE) ×3 IMPLANT
GLOVE SURG LX 7.5 STRW (GLOVE) ×2
GLOVE SURG LX STRL 7.5 STRW (GLOVE) ×1 IMPLANT
GOWN STRL REUS W/ TWL LRG LVL3 (GOWN DISPOSABLE) ×2 IMPLANT
GOWN STRL REUS W/TWL LRG LVL3 (GOWN DISPOSABLE) ×4
LENS IOL TECNIS TRC I 225 22.5 (Intraocular Lens) ×1 IMPLANT
LENS IOL TORIC 22.5 (Intraocular Lens) ×2 IMPLANT
LENS IOL TORIC 225 22.5 (Intraocular Lens) ×1 IMPLANT
PACK CATARACT (MISCELLANEOUS) ×3 IMPLANT
PACK CATARACT BRASINGTON LX (MISCELLANEOUS) ×3 IMPLANT
PACK EYE AFTER SURG (MISCELLANEOUS) ×3 IMPLANT
SOL BSS BAG (MISCELLANEOUS) ×3
SOL PREP PVP 2OZ (MISCELLANEOUS) ×3
SOLUTION BSS BAG (MISCELLANEOUS) ×1 IMPLANT
SOLUTION PREP PVP 2OZ (MISCELLANEOUS) ×1 IMPLANT
SYR 3ML LL SCALE MARK (SYRINGE) ×3 IMPLANT
SYR 5ML LL (SYRINGE) ×3 IMPLANT
SYR TB 1ML 27GX1/2 LL (SYRINGE) ×3 IMPLANT
WATER STERILE IRR 1000ML POUR (IV SOLUTION) ×3 IMPLANT
WIPE NON LINTING 3.25X3.25 (MISCELLANEOUS) ×3 IMPLANT

## 2015-10-13 NOTE — Anesthesia Preprocedure Evaluation (Signed)
Anesthesia Evaluation  Patient identified by MRN, date of birth, ID band Patient awake    Reviewed: Allergy & Precautions, NPO status , Patient's Chart, lab work & pertinent test results  Airway Mallampati: II       Dental  (+) Teeth Intact   Pulmonary former smoker,    breath sounds clear to auscultation       Cardiovascular Exercise Tolerance: Good hypertension, Pt. on medications + dysrhythmias  Rhythm:Regular Rate:Normal     Neuro/Psych    GI/Hepatic Neg liver ROS, GERD  ,  Endo/Other  negative endocrine ROS  Renal/GU negative Renal ROS     Musculoskeletal   Abdominal Normal abdominal exam  (+)   Peds  Hematology   Anesthesia Other Findings   Reproductive/Obstetrics                             Anesthesia Physical Anesthesia Plan  ASA: II  Anesthesia Plan: MAC   Post-op Pain Management:    Induction: Intravenous  Airway Management Planned: Natural Airway and Nasal Cannula  Additional Equipment:   Intra-op Plan:   Post-operative Plan:   Informed Consent: I have reviewed the patients History and Physical, chart, labs and discussed the procedure including the risks, benefits and alternatives for the proposed anesthesia with the patient or authorized representative who has indicated his/her understanding and acceptance.     Plan Discussed with: CRNA  Anesthesia Plan Comments:         Anesthesia Quick Evaluation

## 2015-10-13 NOTE — Op Note (Signed)
LOCATION:  [place]   PREOPERATIVE DIAGNOSIS:  Nuclear sclerotic cataract of the right eye.  H25.11   POSTOPERATIVE DIAGNOSIS:  Nuclear sclerotic cataract of the right eye.   PROCEDURE:  Phacoemulsification with Toric posterior chamber intraocular lens placement of the right eye.   LENS:   Implant Name Type Inv. Item Serial No. Manufacturer Lot No. LRB No. Used  LENS IOL TORIC 22.5 - [id number]  Intraocular Lens LENS IOL TORIC 22.5 [id number]  AMO [id number] Right 1     [id number] 22.0D Toric intraocular lens with 2.25 diopters of cylindrical power with axis orientation at 162 degrees.   ULTRASOUND TIME: 17 % of 1 minutes, 23 seconds.  CDE 14.3   SURGEON:  Wyonia Hough, MD   ANESTHESIA: Topical with tetracaine drops and 2% Xylocaine jelly, augmented with 1% preservative-free intracameral lidocaine. .   COMPLICATIONS:  None.   DESCRIPTION OF PROCEDURE:  The patient was identified in the holding room and transported to the operating suite and placed in the supine position under the operating microscope.  The right eye was identified as the operative eye, and it was prepped and draped in the usual sterile ophthalmic fashion.    A clear-corneal paracentesis incision was made at the 12:00 position.  0.5 ml of preservative-free 1% lidocaine was injected into the anterior chamber. The anterior chamber was filled with Viscoat.  A 2.4 millimeter near clear corneal incision was then made at the 9:00 position.  A cystotome and capsulorrhexis forceps were then used to make a curvilinear capsulorrhexis.  Hydrodissection and hydrodelineation were then performed using balanced salt solution.   Phacoemulsification was then used in stop and chop fashion to remove the lens, nucleus and epinucleus.  The remaining cortex was aspirated using the irrigation and aspiration handpiece.  Provisc viscoelastic was then placed into the capsular bag to distend it for lens placement.  The Verion  digital marker was used to align the implant at the intended axis.   A Toric lens was then injected into the capsular bag.  It was rotated clockwise until the axis marks on the lens were approximately 15 degrees in the counterclockwise direction to the intended alignment.  The viscoelastic was aspirated from the eye using the irrigation aspiration handpiece.  Then, a Koch spatula through the sideport incision was used to rotate the lens in a clockwise direction until the axis markings of the intraocular lens were lined up with the Verion alignment.  Balanced salt solution was then used to hydrate the wounds.  Wounds were hydrated with balanced salt solution.  The anterior chamber was inflated to a physiologic pressure with balanced salt solution. Cefuroxime 0.1 ml of a 10mg /ml solution was injected into the anterior chamber for a dose of 1 mg of intracameral antibiotic at the completion of the case. Miostat was placed into the anterior chamber to constrict the pupil.  No wound leaks were noted.  Topical Vigamox drops and Maxitrol ointment were applied to the eye.  The patient was taken to the recovery room in stable condition without complications of anesthesia or surgery.  Lekha Dancer 10/13/2015, 10:31 AM

## 2015-10-13 NOTE — Anesthesia Postprocedure Evaluation (Signed)
Anesthesia Post Note  Patient: [name]  Procedure(s) Performed: Procedure(s) (LRB): CATARACT EXTRACTION PHACO AND INTRAOCULAR LENS PLACEMENT (IOC) (Right)  Patient location during evaluation: PACU Anesthesia Type: MAC Level of consciousness: awake Pain management: pain level controlled Vital Signs Assessment: post-procedure vital signs reviewed and stable Respiratory status: spontaneous breathing Cardiovascular status: blood pressure returned to baseline Anesthetic complications: no    Last Vitals:  Filed Vitals:   10/13/15 1032 10/13/15 1035  BP: 150/87 133/69  Pulse: 82   Temp: 36.4 C   Resp: 16     Last Pain: There were no vitals filed for this visit.               VAN STAVEREN,Latara Micheli

## 2015-10-13 NOTE — Discharge Instructions (Signed)
AMBULATORY SURGERY  DISCHARGE INSTRUCTIONS   1) The drugs that you were given will stay in your system until tomorrow so for the next 24 hours you should not:  A) Drive an automobile B) Make any legal decisions C) Drink any alcoholic beverage   2) You may resume regular meals tomorrow.  Today it is better to start with liquids and gradually work up to solid foods.  You may eat anything you prefer, but it is better to start with liquids, then soup and crackers, and gradually work up to solid foods.   3) Please notify your doctor immediately if you have any unusual bleeding, trouble breathing, redness and pain at the surgery site, drainage, fever, or pain not relieved by medication.    4) Additional Instructions:    Eye Surgery Discharge Instructions  Expect mild scratchy sensation or mild soreness. DO NOT RUB YOUR EYE!  The day of surgery:  Minimal physical activity, but bed rest is not required  No reading, computer work, or close hand work  No bending, lifting, or straining.  May watch TV  For 24 hours:  No driving, legal decisions, or alcoholic beverages  Safety precautions  Eat anything you prefer: It is better to start with liquids, then soup then solid foods.  _____ Eye patch should be worn until postoperative exam tomorrow.  ____ Solar shield eyeglasses should be worn for comfort in the sunlight/patch while sleeping  Resume all regular medications including aspirin or Coumadin if these were discontinued prior to surgery. You may shower, bathe, shave, or wash your hair. Tylenol may be taken for mild discomfort.  Call your doctor if you experience significant pain, nausea, or vomiting, fever > 101 or other signs of infection. [phone] or [phone] Specific instructions:  Follow-up Information    Follow up with Leandrew Koyanagi, MD.   Specialty:  Ophthalmology   Why:  April 7 at 10:55am   Contact information:   [address]   [place] [address] [phone]         Please contact your physician with any problems or Same Day Surgery at [phone], Monday through Friday 6 am to 4 pm, or [place] at [facility] number at [phone].

## 2015-10-13 NOTE — H&P (Signed)
  The History and Physical notes are on paper, have been signed, and are to be scanned. The patient remains stable and unchanged from the H&P.   Previous H&P reviewed, patient examined, and there are no changes.  [name] 10/13/2015 9:56 AM

## 2015-10-13 NOTE — Transfer of Care (Signed)
Immediate Anesthesia Transfer of Care Note  Patient: [name]  Procedure(s) Performed: Procedure(s) with comments: CATARACT EXTRACTION PHACO AND INTRAOCULAR LENS PLACEMENT (IOC) (Right) - Lot # [id number] H US:01:23.3 AP:17.2 CDE:14.31  Patient Location: PACU and Short Stay  Anesthesia Type:MAC  Level of Consciousness: awake and patient cooperative  Airway & Oxygen Therapy: Patient Spontanous Breathing  Post-op Assessment: Report given to RN and Post -op Vital signs reviewed and stable  Post vital signs: Reviewed and stable  Last Vitals:  Filed Vitals:   10/13/15 0827  BP: 140/92  Pulse: 85  Temp: 36.4 C  Resp: 16    Complications: No apparent anesthesia complications

## 2015-12-06 DIAGNOSIS — I4891 Unspecified atrial fibrillation: Secondary | ICD-10-CM | POA: Diagnosis not present

## 2015-12-06 DIAGNOSIS — H2512 Age-related nuclear cataract, left eye: Secondary | ICD-10-CM | POA: Diagnosis not present

## 2015-12-07 ENCOUNTER — Encounter: Payer: Self-pay | Admitting: *Deleted

## 2015-12-12 ENCOUNTER — Encounter
Admission: RE | Disposition: A | Discharge status: Home / Self Care | Payer: Self-pay | Source: Ambulatory Visit | Attending: Ophthalmology

## 2015-12-12 ENCOUNTER — Encounter: Payer: Self-pay | Admitting: *Deleted

## 2015-12-12 ENCOUNTER — Ambulatory Visit: Payer: PPO | Admitting: Anesthesiology

## 2015-12-12 ENCOUNTER — Ambulatory Visit
Admission: RE | Admit: 2015-12-12 | Discharge: 2015-12-12 | Disposition: A | Discharge status: Home / Self Care | Payer: PPO | Source: Ambulatory Visit | Attending: Ophthalmology | Admitting: Ophthalmology

## 2015-12-12 DIAGNOSIS — I10 Essential (primary) hypertension: Secondary | ICD-10-CM | POA: Diagnosis not present

## 2015-12-12 DIAGNOSIS — Z96651 Presence of right artificial knee joint: Secondary | ICD-10-CM | POA: Diagnosis not present

## 2015-12-12 DIAGNOSIS — I4891 Unspecified atrial fibrillation: Secondary | ICD-10-CM | POA: Insufficient documentation

## 2015-12-12 DIAGNOSIS — Z8546 Personal history of malignant neoplasm of prostate: Secondary | ICD-10-CM | POA: Diagnosis not present

## 2015-12-12 DIAGNOSIS — H2512 Age-related nuclear cataract, left eye: Secondary | ICD-10-CM | POA: Diagnosis not present

## 2015-12-12 DIAGNOSIS — Z87891 Personal history of nicotine dependence: Secondary | ICD-10-CM | POA: Diagnosis not present

## 2015-12-12 DIAGNOSIS — E7800 Pure hypercholesterolemia, unspecified: Secondary | ICD-10-CM | POA: Insufficient documentation

## 2015-12-12 DIAGNOSIS — Z7901 Long term (current) use of anticoagulants: Secondary | ICD-10-CM | POA: Insufficient documentation

## 2015-12-12 DIAGNOSIS — Z9889 Other specified postprocedural states: Secondary | ICD-10-CM | POA: Insufficient documentation

## 2015-12-12 DIAGNOSIS — Z79899 Other long term (current) drug therapy: Secondary | ICD-10-CM | POA: Insufficient documentation

## 2015-12-12 DIAGNOSIS — H269 Unspecified cataract: Secondary | ICD-10-CM | POA: Diagnosis not present

## 2015-12-12 DIAGNOSIS — Z9841 Cataract extraction status, right eye: Secondary | ICD-10-CM | POA: Insufficient documentation

## 2015-12-12 DIAGNOSIS — K219 Gastro-esophageal reflux disease without esophagitis: Secondary | ICD-10-CM | POA: Diagnosis not present

## 2015-12-12 DIAGNOSIS — Z9079 Acquired absence of other genital organ(s): Secondary | ICD-10-CM | POA: Diagnosis not present

## 2015-12-12 HISTORY — PX: CATARACT EXTRACTION W/PHACO: SHX586

## 2015-12-12 SURGERY — PHACOEMULSIFICATION, CATARACT, WITH IOL INSERTION
Anesthesia: Monitor Anesthesia Care | Site: Eye | Laterality: Left | Wound class: Clean

## 2015-12-12 MED ORDER — NEOMYCIN-POLYMYXIN-DEXAMETH 0.1 % OP OINT
TOPICAL_OINTMENT | OPHTHALMIC | Status: DC | PRN
  Administered 2015-12-12: 1 via OPHTHALMIC

## 2015-12-12 MED ORDER — FENTANYL CITRATE (PF) 100 MCG/2ML IJ SOLN
INTRAMUSCULAR | Status: DC | PRN
  Administered 2015-12-12: 50 ug via INTRAVENOUS

## 2015-12-12 MED ORDER — MIDAZOLAM HCL 2 MG/2ML IJ SOLN
INTRAMUSCULAR | Status: DC | PRN
  Administered 2015-12-12: 1 mg via INTRAVENOUS

## 2015-12-12 MED ORDER — CEFUROXIME OPHTHALMIC INJECTION 1 MG/0.1 ML
INJECTION | OPHTHALMIC | Status: DC | PRN
  Administered 2015-12-12: 0.1 mL via INTRACAMERAL

## 2015-12-12 MED ORDER — CARBACHOL 0.01 % IO SOLN
INTRAOCULAR | Status: DC | PRN
  Administered 2015-12-12: 0.5 mL via INTRAOCULAR

## 2015-12-12 MED ORDER — ARMC OPHTHALMIC DILATING GEL
1.0000 "application " | OPHTHALMIC | Status: AC | PRN
  Administered 2015-12-12 (×2): 1 via OPHTHALMIC

## 2015-12-12 MED ORDER — NA HYALUR & NA CHOND-NA HYALUR 0.55-0.5 ML IO KIT
PACK | INTRAOCULAR | Status: AC
  Filled 2015-12-12: qty 1.05

## 2015-12-12 MED ORDER — NEOMYCIN-POLYMYXIN-DEXAMETH 3.5-10000-0.1 OP OINT
TOPICAL_OINTMENT | OPHTHALMIC | Status: AC
  Filled 2015-12-12: qty 3.5

## 2015-12-12 MED ORDER — SODIUM CHLORIDE 0.9 % IV SOLN
INTRAVENOUS | Status: DC
  Administered 2015-12-12: 08:00:00 via INTRAVENOUS

## 2015-12-12 MED ORDER — EPINEPHRINE HCL 1 MG/ML IJ SOLN
INTRAOCULAR | Status: DC | PRN
  Administered 2015-12-12: 10:00:00 via OPHTHALMIC

## 2015-12-12 MED ORDER — EPINEPHRINE HCL 1 MG/ML IJ SOLN
INTRAMUSCULAR | Status: AC
  Filled 2015-12-12: qty 1

## 2015-12-12 MED ORDER — LIDOCAINE HCL (PF) 4 % IJ SOLN
INTRAMUSCULAR | Status: AC
  Filled 2015-12-12: qty 5

## 2015-12-12 MED ORDER — MOXIFLOXACIN HCL 0.5 % OP SOLN: 1.0000 [drp] | OPHTHALMIC | Status: DC | PRN

## 2015-12-12 MED ORDER — NA CHONDROIT SULF-NA HYALURON 40-30 MG/ML IO SOLN
INTRAOCULAR | Status: DC | PRN
  Administered 2015-12-12: 0.5 mL via INTRAOCULAR

## 2015-12-12 MED ORDER — CEFUROXIME OPHTHALMIC INJECTION 1 MG/0.1 ML
INJECTION | OPHTHALMIC | Status: AC
  Filled 2015-12-12: qty 0.1

## 2015-12-12 MED ORDER — POVIDONE-IODINE 5 % OP SOLN
1.0000 "application " | Freq: Once | OPHTHALMIC | Status: AC
  Administered 2015-12-12: 1 via OPHTHALMIC

## 2015-12-12 MED ORDER — LIDOCAINE HCL (PF) 4 % IJ SOLN
INTRAOCULAR | Status: DC | PRN
  Administered 2015-12-12: 10:00:00 via OPHTHALMIC

## 2015-12-12 MED ORDER — TETRACAINE HCL 0.5 % OP SOLN
1.0000 [drp] | Freq: Once | OPHTHALMIC | Status: AC
  Administered 2015-12-12: 1 [drp] via OPHTHALMIC

## 2015-12-12 MED ORDER — NA HYALUR & NA CHOND-NA HYALUR 0.4-0.35 ML IO KIT
PACK | INTRAOCULAR | Status: DC | PRN
  Administered 2015-12-12: .4 mL via INTRAOCULAR

## 2015-12-12 SURGICAL SUPPLY — 23 items
CANNULA ANT/CHMB 27GA (MISCELLANEOUS) ×3 IMPLANT
CUP MEDICINE 2OZ PLAST GRAD ST (MISCELLANEOUS) ×3 IMPLANT
GLOVE BIO SURGEON STRL SZ8 (GLOVE) ×3 IMPLANT
GLOVE BIOGEL M 6.5 STRL (GLOVE) ×3 IMPLANT
GLOVE SURG LX 7.5 STRW (GLOVE) ×2
GLOVE SURG LX STRL 7.5 STRW (GLOVE) ×1 IMPLANT
GOWN STRL REUS W/ TWL LRG LVL3 (GOWN DISPOSABLE) ×2 IMPLANT
GOWN STRL REUS W/TWL LRG LVL3 (GOWN DISPOSABLE) ×4
LENS IOL TECNIS TRC I 150 22.5 ×1 IMPLANT
LENS IOL TORIC 150 22.5 ×1 IMPLANT
LENS IOL TORIC 22.5 ×2 IMPLANT
PACK CATARACT (MISCELLANEOUS) ×3 IMPLANT
PACK CATARACT BRASINGTON LX (MISCELLANEOUS) ×3 IMPLANT
PACK EYE AFTER SURG (MISCELLANEOUS) ×3 IMPLANT
SOL BSS BAG (MISCELLANEOUS) ×3
SOL PREP PVP 2OZ (MISCELLANEOUS) ×3
SOLUTION BSS BAG (MISCELLANEOUS) ×1 IMPLANT
SOLUTION PREP PVP 2OZ (MISCELLANEOUS) ×1 IMPLANT
SYR 3ML LL SCALE MARK (SYRINGE) ×3 IMPLANT
SYR 5ML LL (SYRINGE) ×3 IMPLANT
SYR TB 1ML 27GX1/2 LL (SYRINGE) ×3 IMPLANT
WATER STERILE IRR 1000ML POUR (IV SOLUTION) ×3 IMPLANT
WIPE NON LINTING 3.25X3.25 (MISCELLANEOUS) ×3 IMPLANT

## 2015-12-12 NOTE — H&P (Signed)
  The History and Physical notes are on paper, have been signed, and are to be scanned. The patient remains stable and unchanged from the H&P.   Previous H&P reviewed, patient examined, and there are no changes.  Narek Kniss 12/12/2015 9:02 AM

## 2015-12-12 NOTE — Transfer of Care (Signed)
Immediate Anesthesia Transfer of Care Note  Patient: [name]  Procedure(s) Performed: Procedure(s) with comments: CATARACT EXTRACTION PHACO AND INTRAOCULAR LENS PLACEMENT (IOC) (Left) - [place] 1.10 AP% 15.9 CDE 11.22 FLUID PACK LOT # [id number]  Patient Location: PACU  Anesthesia Type:MAC  Level of Consciousness: awake, alert  and oriented  Airway & Oxygen Therapy: Patient Spontanous Breathing  Post-op Assessment: Report given to RN  Post vital signs: Reviewed and stable  Last Vitals:  Filed Vitals:   12/12/15 0820 12/12/15 0955  BP: 148/63 125/67  Pulse: 81 58  Temp: 35.2 C 37 C  Resp: 18 20    Last Pain: There were no vitals filed for this visit.       Complications: No apparent anesthesia complications

## 2015-12-12 NOTE — Anesthesia Postprocedure Evaluation (Signed)
Anesthesia Post Note  Patient: [name]  Procedure(s) Performed: Procedure(s) (LRB): CATARACT EXTRACTION PHACO AND INTRAOCULAR LENS PLACEMENT (IOC) (Left)  Patient location during evaluation: Other Anesthesia Type: General Level of consciousness: awake and alert Pain management: pain level controlled Vital Signs Assessment: post-procedure vital signs reviewed and stable Respiratory status: spontaneous breathing, nonlabored ventilation, respiratory function stable and patient connected to nasal cannula oxygen Cardiovascular status: blood pressure returned to baseline and stable Postop Assessment: no signs of nausea or vomiting Anesthetic complications: no    Last Vitals:  Filed Vitals:   12/12/15 0955 12/12/15 1006  BP: 125/67 127/59  Pulse: 58 77  Temp: 37 C 35.7 C  Resp: 20 20    Last Pain: There were no vitals filed for this visit.               Adrian Dinovo S

## 2015-12-12 NOTE — Op Note (Signed)
  PREOPERATIVE DIAGNOSIS:  Nuclear sclerotic cataract of the left eye.  H25.12  POSTOPERATIVE DIAGNOSIS:  Nuclear sclerotic cataract of the left eye.   PROCEDURE:  Phacoemulsification with Toric posterior chamber intraocular lens placement of the left eye.   LENS:  Implant Name Type Inv. Item Serial No. Manufacturer Lot No. LRB No. Used  LENS IOL TORIC 22.5 - [id number]   LENS IOL TORIC 22.5 [id number]   Left 1   Toric intraocular lens with 1.50 diopters of cylindrical power with axis orientation at 12 degrees.   ULTRASOUND TIME: 16 % of 1 minutes, 10 seconds.  CDE 11.2   SURGEON:  Wyonia Hough, MD   ANESTHESIA:  Topical with tetracaine drops and 2% Xylocaine jelly, augmented with 1% preservative-free intracameral lidocaine.  COMPLICATIONS:  None.   DESCRIPTION OF PROCEDURE:  The patient was identified in the holding room and transported to the operating suite and placed in the supine position under the operating microscope.  The left eye was identified as the operative eye, and it was prepped and draped in the usual sterile ophthalmic fashion.    A clear-corneal paracentesis incision was made at the 1:30 position.  0.5 ml of preservative-free 1% lidocaine was injected into the anterior chamber. The anterior chamber was filled with Viscoat.  A 2.4 millimeter near clear corneal incision was then made at the 10:30 position.  A cystotome and capsulorrhexis forceps were then used to make a curvilinear capsulorrhexis.  Hydrodissection and hydrodelineation were then performed using balanced salt solution.   Phacoemulsification was then used in stop and chop fashion to remove the lens, nucleus and epinucleus.  The remaining cortex was aspirated using the irrigation and aspiration handpiece.  Provisc viscoelastic was then placed into the capsular bag to distend it for lens placement.  The Verion digital marker was used to align the implant at the intended axis.   A 22.5 diopter lens  was then injected into the capsular bag.  It was rotated clockwise until the axis marks on the lens were approximately 15 degrees in the counterclockwise direction to the intended alignment.  The viscoelastic was aspirated from the eye using the irrigation aspiration handpiece.  Then, a Koch spatula through the sideport incision was used to rotate the lens in a clockwise direction until the axis markings of the intraocular lens were lined up with the Verion alignment.  Balanced salt solution was then used to hydrate the wounds.  Miostat was placed into the anterior chamber.   Cefuroxime 0.1 ml of a 10mg /ml solution was injected into the anterior chamber for a dose of 1 mg of intracameral antibiotic at the completion of the case.    The eye was noted to have a physiologic pressure and there was no wound leak noted.   Vigamox drops and Maxitrol ointment were applied to the eye.  The patient was taken to the recovery room in stable condition having had no complications of anesthesia or surgery.  Donovan Gatchel 12/12/2015, 9:59 AM

## 2015-12-12 NOTE — Discharge Instructions (Signed)
Follow eye drop schedule ° °Cataract Surgery, Care After °Refer to this sheet in the next few weeks. These instructions provide you with information on caring for yourself after your procedure. Your caregiver may also give you more specific instructions. Your treatment has been planned according to current medical practices, but problems sometimes occur. Call your caregiver if you have any problems or questions after your procedure.  °HOME CARE INSTRUCTIONS  °· Avoid strenuous activities as directed by your caregiver. °· Ask your caregiver when you can resume driving. °· Use eyedrops or other medicines to help healing and control pressure inside your eye as directed by your caregiver. °· Only take over-the-counter or prescription medicines for pain, discomfort, or fever as directed by your caregiver. °· Do not to touch or rub your eyes. °· You may be instructed to use a protective shield during the first few days and nights after surgery. If not, wear sunglasses to protect your eyes. This is to protect the eye from pressure or from being accidentally bumped. °· Keep the area around your eye clean and dry. Avoid swimming or allowing water to hit you directly in the face while showering. Keep soap and shampoo out of your eyes. °· Do not bend or lift heavy objects. Bending increases pressure in the eye. You can walk, climb stairs, and do light household chores. °· Do not put a contact lens into the eye that had surgery until your caregiver says it is okay to do so. °· Ask your doctor when you can return to work. This will depend on the kind of work that you do. If you work in a dusty environment, you may be advised to wear protective eyewear for a period of time. °· Ask your caregiver when it will be safe to engage in sexual activity. °· Continue with your regular eye exams as directed by your caregiver. °What to expect: °· It is normal to feel itching and mild discomfort for a few days after cataract surgery. Some  fluid discharge is also common, and your eye may be sensitive to light and touch. °· After 1 to 2 days, even moderate discomfort should disappear. In most cases, healing will take about 6 weeks. °· If you received an intraocular lens (IOL), you may notice that colors are very bright or have a blue tinge. Also, if you have been in bright sunlight, everything may appear reddish for a few hours. If you see these color tinges, it is because your lens is clear and no longer cloudy. Within a few months after receiving an IOL, these extra colors should go away. When you have healed, you will probably need new glasses. °SEEK MEDICAL CARE IF:  °· You have increased bruising around your eye. °· You have discomfort not helped by medicine. °SEEK IMMEDIATE MEDICAL CARE IF:  °· You have a  fever. °· You have a worsening or sudden vision loss. °· You have redness, swelling, or increasing pain in the eye. °· You have a thick discharge from the eye that had surgery. °MAKE SURE YOU: °· Understand these instructions. °· Will watch your condition. °· Will get help right away if you are not doing well or get worse. °  °This information is not intended to replace advice given to you by your health care provider. Make sure you discuss any questions you have with your health care provider. °  °Document Released: 01/12/2005 Document Revised: 07/16/2014 Document Reviewed: 02/16/2011 °Elsevier Interactive Patient Education ©2016 [employer] ° °

## 2015-12-12 NOTE — Anesthesia Preprocedure Evaluation (Addendum)
Anesthesia Evaluation  Patient identified by MRN, date of birth, ID band Patient awake    Reviewed: Allergy & Precautions, NPO status , Patient's Chart, lab work & pertinent test results, reviewed documented beta blocker date and time   Airway Mallampati: II  TM Distance: >3 FB     Dental  (+) Chipped, Partial Upper   Pulmonary former smoker,           Cardiovascular hypertension, Pt. on medications + dysrhythmias      Neuro/Psych    GI/Hepatic GERD  Controlled,  Endo/Other    Renal/GU      Musculoskeletal   Abdominal   Peds  Hematology   Anesthesia Other Findings   Reproductive/Obstetrics                            Anesthesia Physical Anesthesia Plan  ASA: III  Anesthesia Plan: MAC   Post-op Pain Management:    Induction:   Airway Management Planned:   Additional Equipment:   Intra-op Plan:   Post-operative Plan:   Informed Consent: I have reviewed the patients History and Physical, chart, labs and discussed the procedure including the risks, benefits and alternatives for the proposed anesthesia with the patient or authorized representative who has indicated his/her understanding and acceptance.     Plan Discussed with: CRNA  Anesthesia Plan Comments:         Anesthesia Quick Evaluation

## 2015-12-21 DIAGNOSIS — X32XXXA Exposure to sunlight, initial encounter: Secondary | ICD-10-CM | POA: Diagnosis not present

## 2015-12-21 DIAGNOSIS — Z08 Encounter for follow-up examination after completed treatment for malignant neoplasm: Secondary | ICD-10-CM | POA: Diagnosis not present

## 2015-12-21 DIAGNOSIS — L821 Other seborrheic keratosis: Secondary | ICD-10-CM | POA: Diagnosis not present

## 2015-12-21 DIAGNOSIS — Z85828 Personal history of other malignant neoplasm of skin: Secondary | ICD-10-CM | POA: Diagnosis not present

## 2015-12-21 DIAGNOSIS — L570 Actinic keratosis: Secondary | ICD-10-CM | POA: Diagnosis not present

## 2016-01-03 DIAGNOSIS — I4891 Unspecified atrial fibrillation: Secondary | ICD-10-CM | POA: Diagnosis not present

## 2016-01-18 DIAGNOSIS — Z961 Presence of intraocular lens: Secondary | ICD-10-CM | POA: Diagnosis not present

## 2016-01-31 DIAGNOSIS — I4891 Unspecified atrial fibrillation: Secondary | ICD-10-CM | POA: Diagnosis not present

## 2016-02-07 DIAGNOSIS — I4891 Unspecified atrial fibrillation: Secondary | ICD-10-CM | POA: Diagnosis not present

## 2016-02-09 DIAGNOSIS — D519 Vitamin B12 deficiency anemia, unspecified: Secondary | ICD-10-CM | POA: Diagnosis not present

## 2016-02-09 DIAGNOSIS — I10 Essential (primary) hypertension: Secondary | ICD-10-CM | POA: Diagnosis not present

## 2016-02-16 DIAGNOSIS — D519 Vitamin B12 deficiency anemia, unspecified: Secondary | ICD-10-CM | POA: Diagnosis not present

## 2016-02-16 DIAGNOSIS — Z8546 Personal history of malignant neoplasm of prostate: Secondary | ICD-10-CM | POA: Diagnosis not present

## 2016-02-16 DIAGNOSIS — E538 Deficiency of other specified B group vitamins: Secondary | ICD-10-CM | POA: Diagnosis not present

## 2016-02-16 DIAGNOSIS — I10 Essential (primary) hypertension: Secondary | ICD-10-CM | POA: Diagnosis not present

## 2016-02-16 DIAGNOSIS — I482 Chronic atrial fibrillation: Secondary | ICD-10-CM | POA: Diagnosis not present

## 2016-02-16 DIAGNOSIS — K219 Gastro-esophageal reflux disease without esophagitis: Secondary | ICD-10-CM | POA: Diagnosis not present

## 2016-02-16 DIAGNOSIS — N183 Chronic kidney disease, stage 3 (moderate): Secondary | ICD-10-CM | POA: Diagnosis not present

## 2016-02-16 DIAGNOSIS — R002 Palpitations: Secondary | ICD-10-CM | POA: Diagnosis not present

## 2016-02-16 DIAGNOSIS — L80 Vitiligo: Secondary | ICD-10-CM | POA: Diagnosis not present

## 2016-02-16 DIAGNOSIS — D692 Other nonthrombocytopenic purpura: Secondary | ICD-10-CM | POA: Diagnosis not present

## 2016-02-16 DIAGNOSIS — I739 Peripheral vascular disease, unspecified: Secondary | ICD-10-CM | POA: Diagnosis not present

## 2016-02-16 DIAGNOSIS — E782 Mixed hyperlipidemia: Secondary | ICD-10-CM | POA: Diagnosis not present

## 2016-02-21 DIAGNOSIS — I4891 Unspecified atrial fibrillation: Secondary | ICD-10-CM | POA: Diagnosis not present

## 2016-03-20 DIAGNOSIS — X32XXXA Exposure to sunlight, initial encounter: Secondary | ICD-10-CM | POA: Diagnosis not present

## 2016-03-20 DIAGNOSIS — L570 Actinic keratosis: Secondary | ICD-10-CM | POA: Diagnosis not present

## 2016-04-24 DIAGNOSIS — H353131 Nonexudative age-related macular degeneration, bilateral, early dry stage: Secondary | ICD-10-CM | POA: Diagnosis not present

## 2016-04-27 DIAGNOSIS — M1712 Unilateral primary osteoarthritis, left knee: Secondary | ICD-10-CM | POA: Diagnosis not present

## 2016-04-27 DIAGNOSIS — M25462 Effusion, left knee: Secondary | ICD-10-CM | POA: Diagnosis not present

## 2016-07-17 DIAGNOSIS — H353131 Nonexudative age-related macular degeneration, bilateral, early dry stage: Secondary | ICD-10-CM | POA: Diagnosis not present

## 2016-08-09 DIAGNOSIS — I739 Peripheral vascular disease, unspecified: Secondary | ICD-10-CM | POA: Diagnosis not present

## 2016-08-09 DIAGNOSIS — I482 Chronic atrial fibrillation: Secondary | ICD-10-CM | POA: Diagnosis not present

## 2016-08-09 DIAGNOSIS — D519 Vitamin B12 deficiency anemia, unspecified: Secondary | ICD-10-CM | POA: Diagnosis not present

## 2016-08-09 DIAGNOSIS — I10 Essential (primary) hypertension: Secondary | ICD-10-CM | POA: Diagnosis not present

## 2016-08-09 DIAGNOSIS — K219 Gastro-esophageal reflux disease without esophagitis: Secondary | ICD-10-CM | POA: Diagnosis not present

## 2016-08-09 DIAGNOSIS — E782 Mixed hyperlipidemia: Secondary | ICD-10-CM | POA: Diagnosis not present

## 2016-08-09 DIAGNOSIS — N183 Chronic kidney disease, stage 3 (moderate): Secondary | ICD-10-CM | POA: Diagnosis not present

## 2016-08-09 DIAGNOSIS — D692 Other nonthrombocytopenic purpura: Secondary | ICD-10-CM | POA: Diagnosis not present

## 2016-08-09 DIAGNOSIS — M150 Primary generalized (osteo)arthritis: Secondary | ICD-10-CM | POA: Diagnosis not present

## 2016-09-11 ENCOUNTER — Ambulatory Visit (INDEPENDENT_AMBULATORY_CARE_PROVIDER_SITE_OTHER): Payer: Self-pay | Admitting: Vascular Surgery

## 2016-09-11 ENCOUNTER — Encounter (INDEPENDENT_AMBULATORY_CARE_PROVIDER_SITE_OTHER): Payer: Self-pay

## 2016-10-03 DIAGNOSIS — I4891 Unspecified atrial fibrillation: Secondary | ICD-10-CM | POA: Diagnosis not present

## 2016-10-11 ENCOUNTER — Other Ambulatory Visit (INDEPENDENT_AMBULATORY_CARE_PROVIDER_SITE_OTHER): Payer: Self-pay | Admitting: Vascular Surgery

## 2016-10-11 DIAGNOSIS — I4891 Unspecified atrial fibrillation: Secondary | ICD-10-CM | POA: Diagnosis not present

## 2016-10-11 DIAGNOSIS — I10 Essential (primary) hypertension: Secondary | ICD-10-CM | POA: Diagnosis not present

## 2016-10-11 DIAGNOSIS — I6523 Occlusion and stenosis of bilateral carotid arteries: Secondary | ICD-10-CM

## 2016-10-11 DIAGNOSIS — I70219 Atherosclerosis of native arteries of extremities with intermittent claudication, unspecified extremity: Secondary | ICD-10-CM

## 2016-10-11 DIAGNOSIS — I739 Peripheral vascular disease, unspecified: Secondary | ICD-10-CM | POA: Diagnosis not present

## 2016-10-11 DIAGNOSIS — E782 Mixed hyperlipidemia: Secondary | ICD-10-CM | POA: Diagnosis not present

## 2016-10-12 ENCOUNTER — Ambulatory Visit (INDEPENDENT_AMBULATORY_CARE_PROVIDER_SITE_OTHER): Payer: PPO

## 2016-10-12 ENCOUNTER — Ambulatory Visit (INDEPENDENT_AMBULATORY_CARE_PROVIDER_SITE_OTHER): Payer: PPO | Admitting: Vascular Surgery

## 2016-10-12 ENCOUNTER — Encounter (INDEPENDENT_AMBULATORY_CARE_PROVIDER_SITE_OTHER): Payer: Self-pay | Admitting: Vascular Surgery

## 2016-10-12 VITALS — BP 152/78 | HR 84 | Resp 16 | Ht 69.0 in | Wt 189.0 lb

## 2016-10-12 DIAGNOSIS — E785 Hyperlipidemia, unspecified: Secondary | ICD-10-CM

## 2016-10-12 DIAGNOSIS — I6523 Occlusion and stenosis of bilateral carotid arteries: Secondary | ICD-10-CM | POA: Diagnosis not present

## 2016-10-12 DIAGNOSIS — I70219 Atherosclerosis of native arteries of extremities with intermittent claudication, unspecified extremity: Secondary | ICD-10-CM

## 2016-10-12 DIAGNOSIS — I739 Peripheral vascular disease, unspecified: Secondary | ICD-10-CM

## 2016-10-12 DIAGNOSIS — I10 Essential (primary) hypertension: Secondary | ICD-10-CM

## 2016-10-12 LAB — VAS US CAROTID
LCCAPSYS: 70 cm/s
LEFT ECA DIAS: -8 cm/s
LICADSYS: -46 cm/s
LICAPDIAS: -15 cm/s
LICAPSYS: -51 cm/s
Left CCA dist dias: -5 cm/s
Left CCA dist sys: -52 cm/s
Left CCA prox dias: 10 cm/s
Left ICA dist dias: -10 cm/s
RIGHT CCA MID DIAS: -11 cm/s
RIGHT ECA DIAS: 12 cm/s
Right CCA prox dias: -7 cm/s
Right CCA prox sys: -50 cm/s
Right cca dist sys: -67 cm/s

## 2016-10-16 DIAGNOSIS — D485 Neoplasm of uncertain behavior of skin: Secondary | ICD-10-CM | POA: Diagnosis not present

## 2016-10-16 DIAGNOSIS — D044 Carcinoma in situ of skin of scalp and neck: Secondary | ICD-10-CM | POA: Diagnosis not present

## 2016-10-16 DIAGNOSIS — Z85828 Personal history of other malignant neoplasm of skin: Secondary | ICD-10-CM | POA: Diagnosis not present

## 2016-10-16 DIAGNOSIS — D0439 Carcinoma in situ of skin of other parts of face: Secondary | ICD-10-CM | POA: Diagnosis not present

## 2016-10-16 DIAGNOSIS — Z08 Encounter for follow-up examination after completed treatment for malignant neoplasm: Secondary | ICD-10-CM | POA: Diagnosis not present

## 2016-10-16 DIAGNOSIS — X32XXXA Exposure to sunlight, initial encounter: Secondary | ICD-10-CM | POA: Diagnosis not present

## 2016-10-16 DIAGNOSIS — L570 Actinic keratosis: Secondary | ICD-10-CM | POA: Diagnosis not present

## 2016-10-22 DIAGNOSIS — I4891 Unspecified atrial fibrillation: Secondary | ICD-10-CM | POA: Diagnosis not present

## 2016-11-05 DIAGNOSIS — Z7901 Long term (current) use of anticoagulants: Secondary | ICD-10-CM | POA: Diagnosis not present

## 2016-11-27 DIAGNOSIS — L905 Scar conditions and fibrosis of skin: Secondary | ICD-10-CM | POA: Diagnosis not present

## 2016-11-27 DIAGNOSIS — D044 Carcinoma in situ of skin of scalp and neck: Secondary | ICD-10-CM | POA: Diagnosis not present

## 2016-12-04 DIAGNOSIS — L905 Scar conditions and fibrosis of skin: Secondary | ICD-10-CM | POA: Diagnosis not present

## 2016-12-04 DIAGNOSIS — D0439 Carcinoma in situ of skin of other parts of face: Secondary | ICD-10-CM | POA: Diagnosis not present

## 2016-12-10 DIAGNOSIS — I4891 Unspecified atrial fibrillation: Secondary | ICD-10-CM | POA: Diagnosis not present

## 2016-12-10 DIAGNOSIS — R202 Paresthesia of skin: Secondary | ICD-10-CM | POA: Diagnosis not present

## 2016-12-25 DIAGNOSIS — Z7901 Long term (current) use of anticoagulants: Secondary | ICD-10-CM | POA: Diagnosis not present

## 2016-12-25 DIAGNOSIS — Z5181 Encounter for therapeutic drug level monitoring: Secondary | ICD-10-CM | POA: Diagnosis not present

## 2017-01-01 DIAGNOSIS — E785 Hyperlipidemia, unspecified: Secondary | ICD-10-CM | POA: Insufficient documentation

## 2017-01-01 DIAGNOSIS — I739 Peripheral vascular disease, unspecified: Secondary | ICD-10-CM | POA: Insufficient documentation

## 2017-01-01 DIAGNOSIS — I6523 Occlusion and stenosis of bilateral carotid arteries: Secondary | ICD-10-CM | POA: Insufficient documentation

## 2017-01-01 DIAGNOSIS — I10 Essential (primary) hypertension: Secondary | ICD-10-CM | POA: Insufficient documentation

## 2017-01-01 NOTE — Progress Notes (Signed)
Subjective:    Patient ID: [name] Aid., male    DOB: Sep 03, 1925, 83 y.o.   MRN: [id number] Chief Complaint  Patient presents with  . Re-evaluation    Ultrasound follow up   Patient presents for a yearly follow-up / evaluation for carotid artery and peripheral vascular disease. The patient underwent an ABI which was notable for noncompressible vessels most likely the result of medial calcification. The toe brachial indices are normal on the right and abnormal on the left. This is no significant change in the bilateral ankle brachial indices when compared to the previous exam on 09/11/2015. The patient denies any claudication like symptoms, rest pain or ulcers to her feet / toes. The patient underwent a bilateral carotid duplex scan which showed no change from the previous exam on 09-12-15. Duplex is stable at 40-59% ICA stenosis in the Right ICA, with a patent left carotid endarterectomy. The patient denies experiencing Amaurosis Fugax, TIA like symptoms or focal motor deficits. Patient denies any fever, nausea or vomiting.   Review of Systems  Constitutional: Negative.   HENT: Negative.   Eyes: Negative.   Respiratory: Negative.   Cardiovascular: Negative.   Gastrointestinal: Negative.   Endocrine: Negative.   Genitourinary: Negative.   Musculoskeletal: Negative.   Skin: Negative.   Allergic/Immunologic: Negative.   Neurological: Negative.   Hematological: Negative.   Psychiatric/Behavioral: Negative.       Objective:   Physical Exam  Constitutional: He is oriented to person, place, and time. He appears well-developed and well-nourished. No distress.  HENT:  Head: Normocephalic and atraumatic.  Eyes: Conjunctivae are normal. Pupils are equal, round, and reactive to light.  Neck: Normal range of motion.  Right carotid bruit noted  Cardiovascular: Normal rate, regular rhythm, normal heart sounds and intact distal pulses.   Pulses:      Radial pulses are 2+ on the right  side, and 2+ on the left side.       Dorsalis pedis pulses are 2+ on the right side, and 2+ on the left side.       Posterior tibial pulses are 2+ on the right side, and 2+ on the left side.  Pulmonary/Chest: Effort normal.  Musculoskeletal: Normal range of motion. He exhibits no edema.  Neurological: He is alert and oriented to person, place, and time.  Skin: Skin is warm and dry. He is not diaphoretic.  Psychiatric: He has a normal mood and affect. His behavior is normal. Judgment and thought content normal.  Nursing note and vitals reviewed.  BP (!) 152/78 (BP Location: Right Arm)   Pulse 84   Resp 16   Ht 5\' 9"  (1.753 m)   Wt 189 lb (85.7 kg)   BMI 27.91 kg/m   Past Medical History:  Diagnosis Date  . Cancer ([place])    PROSTATE  . Dysrhythmia    AFIB  . GERD (gastroesophageal reflux disease)   . Hypertension    Social History   Social History  . Marital status: Married    Spouse name: N/A  . Number of children: N/A  . Years of education: N/A   Occupational History  . Not on file.   Social History Main Topics  . Smoking status: Former Research scientist (life sciences)  . Smokeless tobacco: Never Used  . Alcohol use Yes     Comment: 1.5 oz daily - 6/3  last use  . Drug use: No  . Sexual activity: Not on file   Other Topics Concern  . Not  on file   Social History Narrative  . No narrative on file   Past Surgical History:  Procedure Laterality Date  . CAROTID ENDARTERECTOMY Left   . CATARACT EXTRACTION W/PHACO Right 10/13/2015   Procedure: CATARACT EXTRACTION PHACO AND INTRAOCULAR LENS PLACEMENT (IOC);  Surgeon: Leandrew Koyanagi, MD;  Location: [facility] ORS;  Service: Ophthalmology;  Laterality: Right;  Lot # [id number] H US:01:23.3 AP:17.2 CDE:14.31  . CATARACT EXTRACTION W/PHACO Left 12/12/2015   Procedure: CATARACT EXTRACTION PHACO AND INTRAOCULAR LENS PLACEMENT (IOC);  Surgeon: Leandrew Koyanagi, MD;  Location: [facility] ORS;  Service: Ophthalmology;  Laterality: Left;  [place] 1.10 AP% 15.9 CDE  11.22 FLUID PACK LOT # [id number]  . JOINT REPLACEMENT Right    TKR   No family history on file.  No Known Allergies     Assessment & Plan:  Patient presents for a yearly follow-up / evaluation for carotid artery and peripheral vascular disease. The patient underwent an ABI which was notable for noncompressible vessels most likely the result of medial calcification. The toe brachial indices are normal on the right and abnormal on the left. This is no significant change in the bilateral ankle brachial indices when compared to the previous exam on 09/11/2015. The patient denies any claudication like symptoms, rest pain or ulcers to her feet / toes. The patient underwent a bilateral carotid duplex scan which showed no change from the previous exam on 09-12-15. Duplex is stable at 40-59% ICA stenosis in the Right ICA, with a patent left carotid endarterectomy. The patient denies experiencing Amaurosis Fugax, TIA like symptoms or focal motor deficits. Patient denies any fever, nausea or vomiting.  1. Bilateral carotid artery stenosis - Stable Studies reviewed with patient. Patient asymptomatic with stable duplex.  No intervention at this time.  Patient to return in 1 year for surveillance carotid duplex. I have discussed with the patient at length the risk factors for and pathogenesis of atherosclerotic disease and encouraged a healthy diet, regular exercise regimen and blood pressure / glucose control.  Patient was instructed to contact our office in the interim with problems such as arm / leg weakness or numbness, speech / swallowing difficulty or temporary monocular blindness. The patient expresses their understanding.   - VAS US CAROTID; Future  2. Peripheral artery disease ([place]) - stable Studies reviewed with patient. Patient is asymptomatic Stable ABI Unremarkable physical exam Patient to follow up in 1 year for an ABI. I have discussed with the patient at length the risk factors for and  pathogenesis of atherosclerotic disease and encouraged a healthy diet, regular exercise regimen and blood pressure / glucose control.  The patient was encouraged to call the office in the interim if he experiences any claudication like symptoms, rest pain or ulcers to his feet / toes.  - VAS [place] ABI WITH/WO TBI; Future  3. Essential hypertension - stable Encouraged good control as its slows the progression of atherosclerotic disease  4. Hyperlipidemia, unspecified hyperlipidemia type - stable Encouraged good control as its slows the progression of atherosclerotic disease  Current Outpatient Prescriptions on File Prior to Visit  Medication Sig Dispense Refill  . Cholecalciferol (VITAMIN D-3 PO) Take by mouth.    . lovastatin (MEVACOR) 40 MG tablet Take by mouth at bedtime.    . Multiple Vitamin (MULTIVITAMIN) capsule Take 1 capsule by mouth daily.    Marland Kitchen terazosin (HYTRIN) 2 MG capsule Take 2 mg by mouth at bedtime. 2TABS DAILY    . vitamin B-12 (CYANOCOBALAMIN) 1000 MCG tablet Take 1,000  mcg by mouth daily.    Marland Kitchen warfarin (COUMADIN) 2 MG tablet Take 2 mg by mouth daily. 2 TABS DAILY     No current facility-administered medications on file prior to visit.     There are no Patient Instructions on file for this visit. No Follow-up on file.   Sadi Arave A Kasiyah Platter, PA-C

## 2017-01-22 DIAGNOSIS — I4891 Unspecified atrial fibrillation: Secondary | ICD-10-CM | POA: Diagnosis not present

## 2017-02-13 DIAGNOSIS — D519 Vitamin B12 deficiency anemia, unspecified: Secondary | ICD-10-CM | POA: Diagnosis not present

## 2017-02-13 DIAGNOSIS — N183 Chronic kidney disease, stage 3 (moderate): Secondary | ICD-10-CM | POA: Diagnosis not present

## 2017-02-13 DIAGNOSIS — I10 Essential (primary) hypertension: Secondary | ICD-10-CM | POA: Diagnosis not present

## 2017-02-13 DIAGNOSIS — E538 Deficiency of other specified B group vitamins: Secondary | ICD-10-CM | POA: Diagnosis not present

## 2017-02-18 DIAGNOSIS — I739 Peripheral vascular disease, unspecified: Secondary | ICD-10-CM | POA: Diagnosis not present

## 2017-02-18 DIAGNOSIS — K219 Gastro-esophageal reflux disease without esophagitis: Secondary | ICD-10-CM | POA: Diagnosis not present

## 2017-02-18 DIAGNOSIS — D692 Other nonthrombocytopenic purpura: Secondary | ICD-10-CM | POA: Diagnosis not present

## 2017-02-18 DIAGNOSIS — M150 Primary generalized (osteo)arthritis: Secondary | ICD-10-CM | POA: Diagnosis not present

## 2017-02-18 DIAGNOSIS — N183 Chronic kidney disease, stage 3 (moderate): Secondary | ICD-10-CM | POA: Diagnosis not present

## 2017-02-18 DIAGNOSIS — E782 Mixed hyperlipidemia: Secondary | ICD-10-CM | POA: Diagnosis not present

## 2017-02-18 DIAGNOSIS — I10 Essential (primary) hypertension: Secondary | ICD-10-CM | POA: Diagnosis not present

## 2017-02-18 DIAGNOSIS — Z8546 Personal history of malignant neoplasm of prostate: Secondary | ICD-10-CM | POA: Diagnosis not present

## 2017-02-18 DIAGNOSIS — Z0000 Encounter for general adult medical examination without abnormal findings: Secondary | ICD-10-CM | POA: Diagnosis not present

## 2017-02-18 DIAGNOSIS — I482 Chronic atrial fibrillation: Secondary | ICD-10-CM | POA: Diagnosis not present

## 2017-02-18 DIAGNOSIS — D519 Vitamin B12 deficiency anemia, unspecified: Secondary | ICD-10-CM | POA: Diagnosis not present

## 2017-02-19 DIAGNOSIS — I4891 Unspecified atrial fibrillation: Secondary | ICD-10-CM | POA: Diagnosis not present

## 2017-03-05 DIAGNOSIS — Z5181 Encounter for therapeutic drug level monitoring: Secondary | ICD-10-CM | POA: Diagnosis not present

## 2017-03-05 DIAGNOSIS — Z7901 Long term (current) use of anticoagulants: Secondary | ICD-10-CM | POA: Diagnosis not present

## 2017-03-13 DIAGNOSIS — Z7901 Long term (current) use of anticoagulants: Secondary | ICD-10-CM | POA: Diagnosis not present

## 2017-03-13 DIAGNOSIS — Z5181 Encounter for therapeutic drug level monitoring: Secondary | ICD-10-CM | POA: Diagnosis not present

## 2017-03-25 DIAGNOSIS — Z5181 Encounter for therapeutic drug level monitoring: Secondary | ICD-10-CM | POA: Diagnosis not present

## 2017-03-25 DIAGNOSIS — Z7901 Long term (current) use of anticoagulants: Secondary | ICD-10-CM | POA: Diagnosis not present

## 2017-03-28 DIAGNOSIS — Z5181 Encounter for therapeutic drug level monitoring: Secondary | ICD-10-CM | POA: Diagnosis not present

## 2017-03-28 DIAGNOSIS — Z7901 Long term (current) use of anticoagulants: Secondary | ICD-10-CM | POA: Diagnosis not present

## 2017-04-04 DIAGNOSIS — I4891 Unspecified atrial fibrillation: Secondary | ICD-10-CM | POA: Diagnosis not present

## 2017-04-10 DIAGNOSIS — L821 Other seborrheic keratosis: Secondary | ICD-10-CM | POA: Diagnosis not present

## 2017-04-10 DIAGNOSIS — Z08 Encounter for follow-up examination after completed treatment for malignant neoplasm: Secondary | ICD-10-CM | POA: Diagnosis not present

## 2017-04-10 DIAGNOSIS — Z85828 Personal history of other malignant neoplasm of skin: Secondary | ICD-10-CM | POA: Diagnosis not present

## 2017-04-10 DIAGNOSIS — L570 Actinic keratosis: Secondary | ICD-10-CM | POA: Diagnosis not present

## 2017-04-10 DIAGNOSIS — X32XXXA Exposure to sunlight, initial encounter: Secondary | ICD-10-CM | POA: Diagnosis not present

## 2017-04-10 DIAGNOSIS — C44321 Squamous cell carcinoma of skin of nose: Secondary | ICD-10-CM | POA: Diagnosis not present

## 2017-04-10 DIAGNOSIS — D485 Neoplasm of uncertain behavior of skin: Secondary | ICD-10-CM | POA: Diagnosis not present

## 2017-04-15 DIAGNOSIS — Z7901 Long term (current) use of anticoagulants: Secondary | ICD-10-CM | POA: Diagnosis not present

## 2017-05-01 DIAGNOSIS — Z7901 Long term (current) use of anticoagulants: Secondary | ICD-10-CM | POA: Diagnosis not present

## 2017-05-03 DIAGNOSIS — I482 Chronic atrial fibrillation: Secondary | ICD-10-CM | POA: Diagnosis not present

## 2017-05-03 DIAGNOSIS — I739 Peripheral vascular disease, unspecified: Secondary | ICD-10-CM | POA: Diagnosis not present

## 2017-05-03 DIAGNOSIS — E7801 Familial hypercholesterolemia: Secondary | ICD-10-CM | POA: Diagnosis not present

## 2017-05-03 DIAGNOSIS — I10 Essential (primary) hypertension: Secondary | ICD-10-CM | POA: Diagnosis not present

## 2017-05-23 DIAGNOSIS — L905 Scar conditions and fibrosis of skin: Secondary | ICD-10-CM | POA: Diagnosis not present

## 2017-05-23 DIAGNOSIS — C44329 Squamous cell carcinoma of skin of other parts of face: Secondary | ICD-10-CM | POA: Diagnosis not present

## 2017-05-23 DIAGNOSIS — C44321 Squamous cell carcinoma of skin of nose: Secondary | ICD-10-CM | POA: Diagnosis not present

## 2017-06-05 DIAGNOSIS — I4891 Unspecified atrial fibrillation: Secondary | ICD-10-CM | POA: Diagnosis not present

## 2017-07-03 DIAGNOSIS — I4891 Unspecified atrial fibrillation: Secondary | ICD-10-CM | POA: Diagnosis not present

## 2017-07-15 ENCOUNTER — Emergency Department: Payer: PPO

## 2017-07-15 ENCOUNTER — Inpatient Hospital Stay
Admission: EM | Admit: 2017-07-15 | Discharge: 2017-07-17 | DRG: 536 | Disposition: A | Discharge status: Skilled Nursing Facility | Payer: PPO | Attending: Internal Medicine | Admitting: Internal Medicine

## 2017-07-15 ENCOUNTER — Other Ambulatory Visit: Payer: Self-pay

## 2017-07-15 DIAGNOSIS — I700 Atherosclerosis of aorta: Secondary | ICD-10-CM | POA: Diagnosis present

## 2017-07-15 DIAGNOSIS — W010XXA Fall on same level from slipping, tripping and stumbling without subsequent striking against object, initial encounter: Secondary | ICD-10-CM | POA: Diagnosis present

## 2017-07-15 DIAGNOSIS — S299XXA Unspecified injury of thorax, initial encounter: Secondary | ICD-10-CM | POA: Diagnosis not present

## 2017-07-15 DIAGNOSIS — J449 Chronic obstructive pulmonary disease, unspecified: Secondary | ICD-10-CM | POA: Diagnosis present

## 2017-07-15 DIAGNOSIS — I509 Heart failure, unspecified: Secondary | ICD-10-CM | POA: Diagnosis present

## 2017-07-15 DIAGNOSIS — S32511D Fracture of superior rim of right pubis, subsequent encounter for fracture with routine healing: Secondary | ICD-10-CM | POA: Diagnosis not present

## 2017-07-15 DIAGNOSIS — S32591A Other specified fracture of right pubis, initial encounter for closed fracture: Principal | ICD-10-CM | POA: Diagnosis present

## 2017-07-15 DIAGNOSIS — Z7901 Long term (current) use of anticoagulants: Secondary | ICD-10-CM | POA: Diagnosis not present

## 2017-07-15 DIAGNOSIS — Z79899 Other long term (current) drug therapy: Secondary | ICD-10-CM | POA: Diagnosis not present

## 2017-07-15 DIAGNOSIS — M8580 Other specified disorders of bone density and structure, unspecified site: Secondary | ICD-10-CM | POA: Diagnosis present

## 2017-07-15 DIAGNOSIS — I110 Hypertensive heart disease with heart failure: Secondary | ICD-10-CM | POA: Diagnosis present

## 2017-07-15 DIAGNOSIS — Z5181 Encounter for therapeutic drug level monitoring: Secondary | ICD-10-CM | POA: Diagnosis not present

## 2017-07-15 DIAGNOSIS — W19XXXD Unspecified fall, subsequent encounter: Secondary | ICD-10-CM | POA: Diagnosis not present

## 2017-07-15 DIAGNOSIS — S32511A Fracture of superior rim of right pubis, initial encounter for closed fracture: Secondary | ICD-10-CM | POA: Diagnosis not present

## 2017-07-15 DIAGNOSIS — J9601 Acute respiratory failure with hypoxia: Secondary | ICD-10-CM | POA: Diagnosis not present

## 2017-07-15 DIAGNOSIS — R0602 Shortness of breath: Secondary | ICD-10-CM | POA: Diagnosis not present

## 2017-07-15 DIAGNOSIS — K219 Gastro-esophageal reflux disease without esophagitis: Secondary | ICD-10-CM | POA: Diagnosis present

## 2017-07-15 DIAGNOSIS — R0902 Hypoxemia: Secondary | ICD-10-CM | POA: Diagnosis present

## 2017-07-15 DIAGNOSIS — I10 Essential (primary) hypertension: Secondary | ICD-10-CM | POA: Diagnosis not present

## 2017-07-15 DIAGNOSIS — Z8546 Personal history of malignant neoplasm of prostate: Secondary | ICD-10-CM

## 2017-07-15 DIAGNOSIS — S329XXA Fracture of unspecified parts of lumbosacral spine and pelvis, initial encounter for closed fracture: Secondary | ICD-10-CM | POA: Diagnosis not present

## 2017-07-15 DIAGNOSIS — Z9981 Dependence on supplemental oxygen: Secondary | ICD-10-CM | POA: Diagnosis not present

## 2017-07-15 DIAGNOSIS — Z96651 Presence of right artificial knee joint: Secondary | ICD-10-CM | POA: Diagnosis not present

## 2017-07-15 DIAGNOSIS — I739 Peripheral vascular disease, unspecified: Secondary | ICD-10-CM | POA: Diagnosis present

## 2017-07-15 DIAGNOSIS — E785 Hyperlipidemia, unspecified: Secondary | ICD-10-CM | POA: Diagnosis present

## 2017-07-15 DIAGNOSIS — I480 Paroxysmal atrial fibrillation: Secondary | ICD-10-CM | POA: Diagnosis not present

## 2017-07-15 DIAGNOSIS — S32509A Unspecified fracture of unspecified pubis, initial encounter for closed fracture: Secondary | ICD-10-CM | POA: Diagnosis present

## 2017-07-15 DIAGNOSIS — M62838 Other muscle spasm: Secondary | ICD-10-CM | POA: Diagnosis not present

## 2017-07-15 DIAGNOSIS — S32501A Unspecified fracture of right pubis, initial encounter for closed fracture: Secondary | ICD-10-CM | POA: Diagnosis not present

## 2017-07-15 DIAGNOSIS — E538 Deficiency of other specified B group vitamins: Secondary | ICD-10-CM | POA: Diagnosis not present

## 2017-07-15 DIAGNOSIS — I482 Chronic atrial fibrillation: Secondary | ICD-10-CM | POA: Diagnosis present

## 2017-07-15 DIAGNOSIS — E559 Vitamin D deficiency, unspecified: Secondary | ICD-10-CM | POA: Diagnosis not present

## 2017-07-15 DIAGNOSIS — Z87891 Personal history of nicotine dependence: Secondary | ICD-10-CM | POA: Diagnosis not present

## 2017-07-15 LAB — CBC WITH DIFFERENTIAL/PLATELET
Basophils Absolute: 0 10*3/uL (ref 0–0.1)
Basophils Relative: 0 %
EOS PCT: 0 %
Eosinophils Absolute: 0 10*3/uL (ref 0–0.7)
HEMATOCRIT: 38.4 % — AB (ref 40.0–52.0)
Hemoglobin: 13.3 g/dL (ref 13.0–18.0)
LYMPHS ABS: 0.8 10*3/uL — AB (ref 1.0–3.6)
LYMPHS PCT: 7 %
MCH: 35 pg — AB (ref 26.0–34.0)
MCHC: 34.7 g/dL (ref 32.0–36.0)
MCV: 101 fL — AB (ref 80.0–100.0)
Monocytes Absolute: 0.3 10*3/uL (ref 0.2–1.0)
Monocytes Relative: 3 %
Neutro Abs: 10.7 10*3/uL — ABNORMAL HIGH (ref 1.4–6.5)
Neutrophils Relative %: 90 %
Platelets: 193 10*3/uL (ref 150–440)
RBC: 3.8 MIL/uL — AB (ref 4.40–5.90)
RDW: 13.9 % (ref 11.5–14.5)
WBC: 11.9 10*3/uL — ABNORMAL HIGH (ref 3.8–10.6)

## 2017-07-15 LAB — BASIC METABOLIC PANEL
ANION GAP: 9 (ref 5–15)
BUN: 13 mg/dL (ref 6–20)
CHLORIDE: 107 mmol/L (ref 101–111)
CO2: 23 mmol/L (ref 22–32)
Calcium: 8.8 mg/dL — ABNORMAL LOW (ref 8.9–10.3)
Creatinine, Ser: 1.15 mg/dL (ref 0.61–1.24)
GFR calc non Af Amer: 54 mL/min — ABNORMAL LOW (ref >60)
Glucose, Bld: 131 mg/dL — ABNORMAL HIGH (ref 65–99)
POTASSIUM: 4.1 mmol/L (ref 3.5–5.1)
Sodium: 139 mmol/L (ref 135–145)

## 2017-07-15 LAB — TROPONIN I: Troponin I: <0.03 ng/mL (ref <0.03)

## 2017-07-15 LAB — PROTIME-INR
INR: 1.66
Prothrombin Time: 19.5 seconds — ABNORMAL HIGH (ref 11.4–15.2)

## 2017-07-15 LAB — INFLUENZA PANEL BY PCR (TYPE A & B)
INFLBPCR: NEGATIVE
Influenza A By PCR: NEGATIVE

## 2017-07-15 LAB — BRAIN NATRIURETIC PEPTIDE: B Natriuretic Peptide: 118 pg/mL — ABNORMAL HIGH (ref 0.0–100.0)

## 2017-07-15 MED ORDER — ACETAMINOPHEN 650 MG RE SUPP: 650.0000 mg | Freq: Four times a day (QID) | RECTAL | Status: DC | PRN

## 2017-07-15 MED ORDER — VITAMIN D 1000 UNITS PO TABS
2000.0000 [IU] | ORAL_TABLET | Freq: Every morning | ORAL | Status: DC
  Administered 2017-07-16 – 2017-07-17 (×2): 2000 [IU] via ORAL
  Filled 2017-07-15 (×2): qty 2

## 2017-07-15 MED ORDER — BISACODYL 5 MG PO TBEC
5.0000 mg | DELAYED_RELEASE_TABLET | Freq: Every day | ORAL | Status: DC | PRN
  Administered 2017-07-16: 5 mg via ORAL
  Filled 2017-07-15: qty 1

## 2017-07-15 MED ORDER — ACETAMINOPHEN 325 MG PO TABS
650.0000 mg | ORAL_TABLET | Freq: Four times a day (QID) | ORAL | Status: DC | PRN
  Administered 2017-07-16: 650 mg via ORAL
  Filled 2017-07-15: qty 2

## 2017-07-15 MED ORDER — VITAMIN B-12 1000 MCG PO TABS
1000.0000 ug | ORAL_TABLET | Freq: Every day | ORAL | Status: DC
  Administered 2017-07-16 – 2017-07-17 (×2): 1000 ug via ORAL
  Filled 2017-07-15 (×2): qty 1

## 2017-07-15 MED ORDER — DIPHENHYDRAMINE-APAP (SLEEP) 25-500 MG PO TABS: 1.0000 | ORAL_TABLET | Freq: Every evening | ORAL | Status: DC | PRN

## 2017-07-15 MED ORDER — ACETAMINOPHEN 500 MG PO TABS
500.0000 mg | ORAL_TABLET | Freq: Every evening | ORAL | Status: DC | PRN
  Administered 2017-07-15: 500 mg via ORAL
  Filled 2017-07-15: qty 1

## 2017-07-15 MED ORDER — DIPHENHYDRAMINE HCL 25 MG PO CAPS
25.0000 mg | ORAL_CAPSULE | Freq: Every evening | ORAL | Status: DC | PRN
  Administered 2017-07-15: 25 mg via ORAL
  Filled 2017-07-15: qty 1

## 2017-07-15 MED ORDER — ONDANSETRON HCL 4 MG/2ML IJ SOLN: 4.0000 mg | Freq: Four times a day (QID) | INTRAMUSCULAR | Status: DC | PRN

## 2017-07-15 MED ORDER — ONDANSETRON HCL 4 MG PO TABS: 4.0000 mg | ORAL_TABLET | Freq: Four times a day (QID) | ORAL | Status: DC | PRN

## 2017-07-15 MED ORDER — HYDROCODONE-ACETAMINOPHEN 5-325 MG PO TABS
1.0000 | ORAL_TABLET | ORAL | Status: DC | PRN
  Administered 2017-07-16 (×3): 1 via ORAL
  Administered 2017-07-17 (×3): 2 via ORAL
  Filled 2017-07-15: qty 1
  Filled 2017-07-15 (×2): qty 2
  Filled 2017-07-15: qty 1
  Filled 2017-07-15: qty 2
  Filled 2017-07-15: qty 1

## 2017-07-15 MED ORDER — TERAZOSIN HCL 2 MG PO CAPS
2.0000 mg | ORAL_CAPSULE | Freq: Every day | ORAL | Status: DC
  Administered 2017-07-16 (×2): 2 mg via ORAL
  Filled 2017-07-15 (×3): qty 1

## 2017-07-15 MED ORDER — PRAVASTATIN SODIUM 20 MG PO TABS
10.0000 mg | ORAL_TABLET | Freq: Every day | ORAL | Status: DC
  Administered 2017-07-16: 10 mg via ORAL
  Filled 2017-07-15: qty 1

## 2017-07-15 MED ORDER — PANTOPRAZOLE SODIUM 40 MG PO TBEC
40.0000 mg | DELAYED_RELEASE_TABLET | Freq: Every day | ORAL | Status: DC
  Administered 2017-07-16 – 2017-07-17 (×2): 40 mg via ORAL
  Filled 2017-07-15 (×2): qty 1

## 2017-07-15 MED ORDER — SODIUM CHLORIDE 0.9 % IV SOLN
Freq: Once | INTRAVENOUS | Status: AC
  Administered 2017-07-15: 23:00:00 via INTRAVENOUS

## 2017-07-15 MED ORDER — DOCUSATE SODIUM 100 MG PO CAPS
100.0000 mg | ORAL_CAPSULE | Freq: Two times a day (BID) | ORAL | Status: DC
  Administered 2017-07-15 – 2017-07-17 (×4): 100 mg via ORAL
  Filled 2017-07-15 (×4): qty 1

## 2017-07-15 MED ORDER — AMLODIPINE BESYLATE 5 MG PO TABS
5.0000 mg | ORAL_TABLET | Freq: Every day | ORAL | Status: DC
  Administered 2017-07-16 – 2017-07-17 (×2): 5 mg via ORAL
  Filled 2017-07-15 (×2): qty 1

## 2017-07-15 MED ORDER — ADULT MULTIVITAMIN W/MINERALS CH
1.0000 | ORAL_TABLET | Freq: Every day | ORAL | Status: DC
  Administered 2017-07-16 – 2017-07-17 (×2): 1 via ORAL
  Filled 2017-07-15 (×2): qty 1

## 2017-07-15 MED ORDER — MULTIVITAMINS PO CAPS: 1.0000 | ORAL_CAPSULE | Freq: Every day | ORAL | Status: DC

## 2017-07-15 MED ORDER — WARFARIN SODIUM 2.5 MG PO TABS
2.5000 mg | ORAL_TABLET | Freq: Every day | ORAL | Status: DC
  Administered 2017-07-16: 2.5 mg via ORAL
  Filled 2017-07-15 (×2): qty 1

## 2017-07-15 MED ORDER — IOPAMIDOL (ISOVUE-370) INJECTION 76%
75.0000 mL | Freq: Once | INTRAVENOUS | Status: AC | PRN
  Administered 2017-07-15: 75 mL via INTRAVENOUS

## 2017-07-15 MED ORDER — TRAZODONE HCL 50 MG PO TABS: 25.0000 mg | ORAL_TABLET | Freq: Every evening | ORAL | Status: DC | PRN

## 2017-07-15 NOTE — ED Notes (Signed)
Pt to CT via stretcher

## 2017-07-15 NOTE — H&P (Signed)
[facility] [place] NAME: [name]    MR#:  [id number]  DATE OF BIRTH:  October 08, 1925  DATE OF ADMISSION:  07/15/2017  PRIMARY CARE PHYSICIAN: Adin Hector, MD   REQUESTING/REFERRING PHYSICIAN:   CHIEF COMPLAINT:   Chief Complaint  Patient presents with  . Fall    HISTORY OF PRESENT ILLNESS: [name]  is a 84 y.o. male with a known history of atrial fibrillation, hypertension and prostate cancer status post Lupron shots and radiation therapy.  Patient is brought to the hospital tonight status post a mechanical ground-level fall while at home.  Prior to this he was feeling well, in his regular state of health.  He denies any chest pain or shortness of breath, no fever or chills, no abdominal pain, no nausea vomiting/diarrhea or bleeding. Upon evaluation in the emergency room, he was noted to have pelvis fracture on the right side.  No other significant injuries were noted, no head injury.  His oxygen saturation was noted to be low, at 89%.  For this he underwent CAT scan of the chest, which showed pulmonary vascular congestion, no PE.  The patient used to be a smoker until 3 years ago.  He denies however being diagnosed with COPD and he does not use any inhalers or nebulizer treatments. Is admitted for further evaluation and treatment.  PAST MEDICAL HISTORY:   Past Medical History:  Diagnosis Date  . Cancer ([place])    PROSTATE  . Dysrhythmia    AFIB  . GERD (gastroesophageal reflux disease)   . Hypertension     PAST SURGICAL HISTORY:  Past Surgical History:  Procedure Laterality Date  . CAROTID ENDARTERECTOMY Left   . CATARACT EXTRACTION W/PHACO Right 10/13/2015   Procedure: CATARACT EXTRACTION PHACO AND INTRAOCULAR LENS PLACEMENT (IOC);  Surgeon: Leandrew Koyanagi, MD;  Location: [facility] ORS;  Service: Ophthalmology;  Laterality: Right;  Lot # [id number] H US:01:23.3 AP:17.2 CDE:14.31  . CATARACT EXTRACTION W/PHACO Left  12/12/2015   Procedure: CATARACT EXTRACTION PHACO AND INTRAOCULAR LENS PLACEMENT (IOC);  Surgeon: Leandrew Koyanagi, MD;  Location: [facility] ORS;  Service: Ophthalmology;  Laterality: Left;  [place] 1.10 AP% 15.9 CDE 11.22 FLUID PACK LOT # [id number]  . JOINT REPLACEMENT Right    TKR    SOCIAL HISTORY:  Social History   Tobacco Use  . Smoking status: Former Research scientist (life sciences)  . Smokeless tobacco: Never Used  Substance Use Topics  . Alcohol use: Yes    Comment: 1.5 oz daily - 6/3  last use    FAMILY HISTORY: No family history on file.  DRUG ALLERGIES: No Known Allergies  REVIEW OF SYSTEMS:   CONSTITUTIONAL: No fever, fatigue or weakness.  EYES: No blurred or double vision.  EARS, NOSE, AND THROAT: No tinnitus or ear pain.  RESPIRATORY: No cough, shortness of breath, wheezing or hemoptysis.  CARDIOVASCULAR: No chest pain, orthopnea, edema.  GASTROINTESTINAL: No nausea, vomiting, diarrhea or abdominal pain.  GENITOURINARY: No dysuria, hematuria.  ENDOCRINE: No polyuria, nocturia,  HEMATOLOGY: No anemia, easy bruising or bleeding SKIN: No rash or lesion. MUSCULOSKELETAL: RT hip and RT pelvis area pain with ROM at RT HIP joint. Currently, not able to ambulate, s/p fall.   NEUROLOGIC: No tingling, numbness; no focal weakness.  PSYCHIATRY: No anxiety or depression.   MEDICATIONS AT HOME:  Prior to Admission medications   Medication Sig Start Date End Date Taking? Authorizing Provider  amLODipine (NORVASC) 5 MG tablet  07/23/16  Yes [provider]  Cholecalciferol (VITAMIN D-3 PO) Take by mouth.   Yes [provider]  diphenhydramine-acetaminophen (TYLENOL PM) 25-500 MG TABS tablet 1-1/2 tablets at bedtime.   Yes [provider]  lovastatin (MEVACOR) 40 MG tablet Take by mouth at bedtime.   Yes [provider]  Multiple Vitamin (MULTIVITAMIN) capsule Take 1 capsule by mouth daily.   Yes [provider]  pantoprazole (PROTONIX) 40 MG tablet TAKE 1 TABLET  BY MOUTH EVERY DAY 10/24/15  Yes [provider]  terazosin (HYTRIN) 2 MG capsule Take 2 mg by mouth at bedtime. 2TABS DAILY   Yes [provider]  vitamin B-12 (CYANOCOBALAMIN) 1000 MCG tablet Take 1,000 mcg by mouth daily.   Yes [provider]  warfarin (COUMADIN) 2 MG tablet Take 4 mg by mouth daily. 2 TABS DAILY    Yes [provider]      PHYSICAL EXAMINATION:   VITAL SIGNS: Blood pressure (!) 167/76, pulse 80, temperature (!) 96.4 F (35.8 C), temperature source Axillary, resp. rate (!) 21, height 5\' 10"  (1.778 m), weight 83.9 kg (185 lb), SpO2 96 %.  GENERAL:  84 y.o.-year-old patient lying in the bed with no acute distress.  EYES: Pupils equal, round, reactive to light and accommodation. No scleral icterus. Extraocular muscles intact.  HEENT: Head atraumatic, normocephalic. Oropharynx and nasopharynx clear.  NECK:  Supple, no jugular venous distention. No thyroid enlargement, no tenderness.  LUNGS: Normal breath sounds bilaterally, no wheezing, rales,rhonchi or crepitation. No use of accessory muscles of respiration.  CARDIOVASCULAR: S1, S2 normal. No murmurs, rubs, or gallops.  ABDOMEN: Soft, nontender, nondistended. Bowel sounds present. No organomegaly or mass.  EXTREMITIES: No pedal edema, cyanosis, or clubbing.  There is tenderness and and reduced range of motion at right hip joint. NEUROLOGIC: No focal weakness. Gait not checked.  PSYCHIATRIC: The patient is alert and oriented x 3.  SKIN: No obvious rash, lesion, or ulcer.   LABORATORY PANEL:   CBC Recent Labs  Lab 07/15/17 1937  WBC 11.9*  HGB 13.3  HCT 38.4*  PLT 193  MCV 101.0*  MCH 35.0*  MCHC 34.7  RDW 13.9  LYMPHSABS 0.8*  MONOABS 0.3  EOSABS 0.0  BASOSABS 0.0   ------------------------------------------------------------------------------------------------------------------  Chemistries  Recent Labs  Lab 07/15/17 2027  NA 139  K 4.1  CL 107  CO2 23  GLUCOSE  131*  BUN 13  CREATININE 1.15  CALCIUM 8.8*   ------------------------------------------------------------------------------------------------------------------ estimated creatinine clearance is 45 mL/min (by C-G formula based on SCr of 1.15 mg/dL). ------------------------------------------------------------------------------------------------------------------ No results for input(s): TSH, T4TOTAL, T3FREE, THYROIDAB in the last 72 hours.  Invalid input(s): FREET3   Coagulation profile Recent Labs  Lab 07/15/17 1937  INR 1.66   ------------------------------------------------------------------------------------------------------------------- No results for input(s): DDIMER in the last 72 hours. -------------------------------------------------------------------------------------------------------------------  Cardiac Enzymes Recent Labs  Lab 07/15/17 1937  TROPONINI <0.03   ------------------------------------------------------------------------------------------------------------------ Invalid input(s): POCBNP  ---------------------------------------------------------------------------------------------------------------  Urinalysis    Component Value Date/Time   COLORURINE Yellow 04/07/2012 1815   APPEARANCEUR Clear 04/07/2012 1815   LABSPEC 1.042 04/07/2012 1815   PHURINE 6.0 04/07/2012 1815   GLUCOSEU Negative 04/07/2012 1815   HGBUR Negative 04/07/2012 1815   BILIRUBINUR Negative 04/07/2012 1815   KETONESUR Negative 04/07/2012 1815   PROTEINUR Negative 04/07/2012 1815   NITRITE Negative 04/07/2012 1815   LEUKOCYTESUR Negative 04/07/2012 1815     RADIOLOGY: Dg Sacrum/coccyx  Result Date: 07/15/2017 CLINICAL DATA:  84 year old male status post trip and fall landing on right hip with  pain. EXAM: SACRUM AND COCCYX - 2+ VIEW COMPARISON:  Right hip series today reported separately. CT Abdomen and Pelvis 04/07/2012 FINDINGS: Osteopenia. Comminuted appearing  fractures of the right inferior pubic ramus, and the lateral aspect of the right superior pubic ramus at the junction with the right acetabulum (arrows). Visible left hemipelvis appears intact. Superimposed vascular calcifications, right iliac vascular stent, and small surgical clips or bone ankles at the symphysis pubis. Grossly intact visible lower lumbar levels. Grossly intact sacrum. IMPRESSION: 1. Comminuted appearing fractures of the right inferior pubic ramus, and the right superior pubic ramus bordering on the right acetabulum. 2. Osteopenia. Vascular calcifications and right iliac artery vascular stent. Electronically Signed   By: Genevie Ann M.D.   On: 07/15/2017 17:19   Ct Angio Chest Pe W And/or Wo Contrast  Result Date: 07/15/2017 CLINICAL DATA:  Acute onset of hypoxia and shortness of breath. Fall resulting in pelvic fractures today. Insert epic EXAM: CT ANGIOGRAPHY CHEST WITH CONTRAST TECHNIQUE: Multidetector CT imaging of the chest was performed using the standard protocol during bolus administration of intravenous contrast. Multiplanar CT image reconstructions and MIPs were obtained to evaluate the vascular anatomy. CONTRAST:  64mL ISOVUE-370 IOPAMIDOL (ISOVUE-370) INJECTION 76% COMPARISON:  Radiograph 07/15/2017 FINDINGS: Cardiovascular: There are no filling defects within the pulmonary arteries to suggest pulmonary embolus. Cardiomegaly. Dense coronary artery calcifications versus stents. Atherosclerosis of the thoracic aorta without dissection. No pericardial effusion. Mediastinum/Nodes: Shotty mediastinal nodes with 9 mm right upper paratracheal node. Smaller lower paratracheal in AP window nodes. No hilar adenopathy. The esophagus is decompressed. Lungs/Pleura: Dependent patchy and ground-glass opacities in both lower lobes. Possible minimal septal thickening. Minimal adherent mucus in the upper trachea, trachea and mainstem bronchi are otherwise patent. No confluent airspace disease. Small  right pleural effusion versus pleural thickening. Upper Abdomen: Calcified gallstones within physiologically distended gallbladder. Punctate hepatic granuloma. No acute finding. Musculoskeletal: No acute rib fracture. No acute fracture of the thoracic spine. Multilevel degenerative change. Review of the MIP images confirms the above findings. IMPRESSION: 1. No pulmonary embolus. 2. Dependent patchy and ground-glass opacities in the dependent lungs likely hypoventilatory atelectasis. A component of vascular congestion is also considered. Trace right pleural effusion versus pleural thickening. 3. Cardiomegaly with aortic atherosclerosis. Coronary artery calcifications versus stents. 4. Incidental finding of gallstones in the upper abdomen. Aortic Atherosclerosis (ICD10-I70.0). Electronically Signed   By: Jeb Levering M.D.   On: 07/15/2017 21:35   Ct Hip Right Wo Contrast  Result Date: 07/15/2017 CLINICAL DATA:  [place].  Known pubic rami fractures. EXAM: CT OF THE RIGHT HIP WITHOUT CONTRAST TECHNIQUE: Multidetector CT imaging of the right hip was performed according to the standard protocol. Multiplanar CT image reconstructions were also generated. COMPARISON:  Radiographs 07/16/2015 FINDINGS: As demonstrated on the plain films there are superior and inferior pubic rami fractures on the right side. The superior pubic ramus fracture is comminuted and mildly displaced and involves the anterior aspect of the acetabulum but no intra-articular component. Nondisplaced inferior pubic ramus fracture. No hip fracture. Moderate to advanced hip joint degenerative changes. No AVN. There is a small extraperitoneal pelvic hematoma. IMPRESSION: 1. Superior and inferior pubic rami fractures on the right side. The superior pubic ramus fracture is comminuted and mildly displaced. 2. No hip fracture. 3. Small extraperitoneal pelvic hematoma. Electronically Signed   By: Marijo Sanes M.D.   On: 07/15/2017 18:21   Dg Chest Portable  1 View  Result Date: 07/15/2017 CLINICAL DATA:  Hip pain after fall. Shortness  of breath and chills. EXAM: PORTABLE CHEST 1 VIEW COMPARISON:  03/28/2014 FINDINGS: Coarse lung markings are suggestive for chronic changes. Heart size is mildly enlarged but unchanged. Atherosclerotic calcifications at the aortic arch. Trachea is midline. Negative for a pneumothorax. Bony structures appear to be intact. IMPRESSION: No active disease. Electronically Signed   By: Markus Daft M.D.   On: 07/15/2017 19:36   Dg Hip Unilat  With Pelvis 2-3 Views Right  Result Date: 07/15/2017 CLINICAL DATA:  Tripped today and fell.  Injured right hip. EXAM: DG HIP (WITH OR WITHOUT PELVIS) 2-3V RIGHT COMPARISON:  None. FINDINGS: Both hips are normally located. Moderate to advanced degenerative changes on the right and advanced degenerative changes on the left. No acute hip fracture is identified. The pubic symphysis and SI joints are intact. Right-sided superior and inferior pubic rami fractures are noted. Moderate vascular calcifications. A right iliac artery stent is noted. IMPRESSION: 1. No acute hip fracture. 2. Superior and inferior pubic rami fractures on the right side. 3. Bilateral hip joint degenerative changes, left greater than right. Electronically Signed   By: Marijo Sanes M.D.   On: 07/15/2017 17:18    EKG: No orders found for this or any previous visit.  IMPRESSION AND PLAN:   1.  Acute right sided pelvis fracture status post mechanical fall.  We will have orthopedics further evaluate the patient. 2.  Acute CHF exacerbation.  We will check 2D echo and initiate diuresis.  We will continue to monitor clinically closely. 3.  Paroxysmal atrial fibrillation currently rate controlled.  Continue anticoagulation of Coumadin. 4.  Hypoxemia likely secondary to CHF exacerbation.  No acute changes on the EKG; troponin level is negative.  No PE or pneumonia per chest CAT scan. 5.  Hypertension stable, we will restart home  medications.  All the records are reviewed and case discussed with ED provider. Management plans discussed with the patient, family and they are in agreement.  CODE STATUS: Code Status History    This patient does not have a recorded code status. Please follow your organizational policy for patients in this situation.       TOTAL TIME TAKING CARE OF THIS PATIENT: 40 minutes.    Amelia Jo M.D on 07/15/2017 at 10:30 PM  Between 7am to 6pm - Pager - [phone]  After 6pm go to [email or web address] - password [email or web address] [facility]  [facility]  Office  [phone]  CC: Primary care physician; Adin Hector, MD

## 2017-07-15 NOTE — ED Notes (Signed)
Admitting MD at bedside.

## 2017-07-15 NOTE — ED Notes (Signed)
Attempted to stand and walk patient at bedside.  Patient sat on edge of bed but unable to stand up d/t pain in right hip.  Pt also SOB.  Was taken off of 2L [place] while trying to ambulate, upon lying back down patient oxygen dropped to 83% RA.  Pt placed back on 2L [place] which he normally doesn't wear and states he is normally not SOB.  MD notified and orders placed for lab work and xray.

## 2017-07-15 NOTE — ED Triage Notes (Signed)
Pt tripped and fell landing on right hip. C/o right hip pain. Denies LOC or hitting head.

## 2017-07-15 NOTE — ED Provider Notes (Addendum)
[facility] Emergency Department Provider Note ____________________________________________   First MD Initiated Contact with Patient 07/15/17 1623     (approximate)  I have reviewed the triage vital signs and the nursing notes.   HISTORY  Chief Complaint Fall    HPI [name] is a 84 y.o. male with past medical history as noted below who presents with right hip injury, acute onset within the last hour prior to his arrival when he was working in his workshop and stepped over his own feet causing him to trip and fall onto the right side from standing height.  Patient denies hitting his head, denies LOC or any other injuries or pain currently.  He states that when he tried to get up with the EMS he was unable to bear weight on the right leg.  However, he is able to move and range the right hip and right leg when he is laying down.  Past Medical History:  Diagnosis Date  . Cancer ([place])    PROSTATE  . Dysrhythmia    AFIB  . GERD (gastroesophageal reflux disease)   . Hypertension     Patient Active Problem List   Diagnosis Date Noted  . Bilateral carotid artery stenosis 01/01/2017  . Peripheral artery disease ([place]) 01/01/2017  . Essential hypertension 01/01/2017  . Hyperlipidemia 01/01/2017    Past Surgical History:  Procedure Laterality Date  . CAROTID ENDARTERECTOMY Left   . CATARACT EXTRACTION W/PHACO Right 10/13/2015   Procedure: CATARACT EXTRACTION PHACO AND INTRAOCULAR LENS PLACEMENT (IOC);  Surgeon: Leandrew Koyanagi, MD;  Location: [facility] ORS;  Service: Ophthalmology;  Laterality: Right;  Lot # [id number] H US:01:23.3 AP:17.2 CDE:14.31  . CATARACT EXTRACTION W/PHACO Left 12/12/2015   Procedure: CATARACT EXTRACTION PHACO AND INTRAOCULAR LENS PLACEMENT (IOC);  Surgeon: Leandrew Koyanagi, MD;  Location: [facility] ORS;  Service: Ophthalmology;  Laterality: Left;  [place] 1.10 AP% 15.9 CDE 11.22 FLUID PACK LOT # [id number]  . JOINT REPLACEMENT Right      TKR    Prior to Admission medications   Medication Sig Start Date End Date Taking? Authorizing Provider  amLODipine (NORVASC) 5 MG tablet  07/23/16   [provider]  Cholecalciferol (VITAMIN D-3 PO) Take by mouth.    [provider]  diphenhydramine-acetaminophen (TYLENOL PM) 25-500 MG TABS tablet 1-1/2 tablets at bedtime.    [provider]  lovastatin (MEVACOR) 40 MG tablet Take by mouth at bedtime.    [provider]  Multiple Vitamin (MULTIVITAMIN) capsule Take 1 capsule by mouth daily.    [provider]  pantoprazole (PROTONIX) 40 MG tablet TAKE 1 TABLET BY MOUTH EVERY DAY 10/24/15   [provider]  terazosin (HYTRIN) 2 MG capsule Take 2 mg by mouth at bedtime. 2TABS DAILY    [provider]  vitamin B-12 (CYANOCOBALAMIN) 1000 MCG tablet Take 1,000 mcg by mouth daily.    [provider]  warfarin (COUMADIN) 2 MG tablet Take 2 mg by mouth daily. 2 TABS DAILY    [provider]    Allergies Patient has no known allergies.  No family history on file.  Social History Social History   Tobacco Use  . Smoking status: Former Research scientist (life sciences)  . Smokeless tobacco: Never Used  Substance Use Topics  . Alcohol use: Yes    Comment: 1.5 oz daily - 6/3  last use  . Drug use: No    Review of Systems  Constitutional: No fever Eyes: No redness. ENT: No neck pain.  Cardiovascular: Denies chest pain. Respiratory: Denies shortness of breath. Gastrointestinal: No nausea, no vomiting.  Genitourinary: Negative for flank pain.  Musculoskeletal: Negative for back pain. Skin: Negative for abrasions or lacerations. Neurological: Negative for headaches.   ____________________________________________   PHYSICAL EXAM:  VITAL SIGNS: ED Triage Vitals  Enc Vitals Group     BP 07/15/17 1557 126/76     Pulse Rate 07/15/17 1557 89     Resp 07/15/17 1557 18     Temp 07/15/17 1601 (!) 96.4 F (35.8 C)     Temp Source  07/15/17 1601 Axillary     SpO2 07/15/17 1557 (!) 88 %     Weight 07/15/17 1600 185 lb (83.9 kg)     Height 07/15/17 1600 5\' 10"  (1.778 m)     Head Circumference --      Peak Flow --      Pain Score 07/15/17 1557 5     Pain Loc --      Pain Edu? --      Excl. in [place]? --     Constitutional: Alert and oriented.  Comfortable appearing and in no acute distress. Eyes: Conjunctivae are normal.  Head: Atraumatic. Nose: No congestion/rhinnorhea. Mouth/Throat: Mucous membranes are moist.   Neck: Normal range of motion.  No C-spine tenderness. Cardiovascular:   Good peripheral circulation. Respiratory: Normal respiratory effort.   Gastrointestinal: Soft and nontender.  Genitourinary: No flank tenderness. Musculoskeletal: No lower extremity edema.  Extremities warm and well perfused.  Right lower extremity with full range of motion in hip and knee, and no deformity.  No significant tenderness to the hip but some tenderness in the right groin.  2+ DP pulse and intact distal motor and fine touch sensory. Neurologic:  Normal speech and language. No gross focal neurologic deficits are appreciated.  Skin:  Skin is warm and dry. No rash noted. Psychiatric: Mood and affect are normal. Speech and behavior are normal.  ____________________________________________   LABS (all labs ordered are listed, but only abnormal results are displayed)  Labs Reviewed  BRAIN NATRIURETIC PEPTIDE - Abnormal; Notable for the following components:      Result Value   B Natriuretic Peptide 118.0 (*)    All other components within normal limits  CBC WITH DIFFERENTIAL/PLATELET - Abnormal; Notable for the following components:   WBC 11.9 (*)    RBC 3.80 (*)    HCT 38.4 (*)    MCV 101.0 (*)    MCH 35.0 (*)    Neutro Abs 10.7 (*)    Lymphs Abs 0.8 (*)    All other components within normal limits  PROTIME-INR - Abnormal; Notable for the following components:   Prothrombin Time 19.5 (*)    All other components  within normal limits  TROPONIN I  BASIC METABOLIC PANEL  INFLUENZA PANEL BY PCR (TYPE A & B)   ____________________________________________  EKG   ____________________________________________  RADIOLOGY  XR R hip/pelvis: R pubic rami fractures CT R hip: No hip fracture, pubic rami fracture as above  ____________________________________________   PROCEDURES  Procedure(s) performed: No    Critical Care performed: No ____________________________________________   INITIAL IMPRESSION / ASSESSMENT AND PLAN / ED COURSE  Pertinent labs & imaging results that were available during my care of the patient were reviewed by me and considered in my medical decision making (see chart for details).  84 year old male with past medical history as noted above presents with right hip injury after mechanical fall from standing height.  The patient  is able to range the right lower extremity well but was unable to bear weight with EMS.  On exam, the vital signs are normal, the patient is relatively comfortable appearing, the right lower extremity is neuro/vascular intact, but he does have some tenderness in the right groin.  Presentation is concerning for possible hip versus pelvic fracture.  Plan for x-rays and then reassess.  ----------------------------------------- 6:10 PM on 07/15/2017 -----------------------------------------  X-ray shows right superior and inferior pubic rami fractures but no obvious hip fracture.  Since the pubic rami fractures go close to the acetabulum, I will obtain a CT to rule out any actual hip fracture.  If the pelvis is the only fracture then patient likely will be weightbearing as tolerated and may be able to go home depending on his functional status.  ----------------------------------------- 7:24 PM on 07/15/2017 -----------------------------------------  RN attempted a trial of ambulation and the patient was not able to bear weight on the right leg.  He  was not able to stand or ambulate even with assistance.  As the patient has been in the ED we have noticed that he has become somewhat hypoxic.  He is not on home oxygen, but his O2 sat is in the 80s on room air and comes up to the low 90s on 3 L by nasal cannula.  He reports feeling chills and being cold but denies any shortness of breath or respiratory distress.  Differential for this includes pneumonia, bronchitis, viral infection, versus chronic process such as COPD or CHF.  Given this new finding I will obtain a chest x-ray and lab workup.  I do not suspect PE given the short time course since the patient's injury, as well as the lack of tachycardia or chest pain.  The patient cannot be safely discharged home to to his inability to ambulate, but if he remains hypoxic he will likely require medical admission.  I counseled the patient and his wife on the workup and on the plan and the expressed agreement.  ----------------------------------------- 8:28 PM on 07/15/2017 -----------------------------------------  Chest x-ray shows no significant findings.  Patient has persistent hypoxia although it is improved on nasal cannula as well as chills.  Etiology is still somewhat unclear.  Although he has no tachycardia, given the otherwise unexplained hypoxia I will obtain a CT chest to rule out PE at this time.  We will also obtain a flu swab.  Plan for admission once his workup is complete.  I signed the patient out to the hospitalist Dr. Duane Boston, and to Dr. Mariea Clonts in the ED who will follow up on the CT results.  ____________________________________________   FINAL CLINICAL IMPRESSION(S) / ED DIAGNOSES  Final diagnoses:  Closed fracture of ramus of right pubis, initial encounter ([place])  Hypoxia      NEW MEDICATIONS STARTED DURING THIS VISIT:  This SmartLink is deprecated. Use AVSMEDLIST instead to display the medication list for a patient.   Note:  This document was prepared using Dragon voice  recognition software and may include unintentional dictation errors.    Arta Silence, MD 07/15/17 2029    Arta Silence, MD 07/15/17 2039

## 2017-07-16 ENCOUNTER — Other Ambulatory Visit: Payer: Self-pay

## 2017-07-16 LAB — CBC
HEMATOCRIT: 35.6 % — AB (ref 40.0–52.0)
Hemoglobin: 12.3 g/dL — ABNORMAL LOW (ref 13.0–18.0)
MCH: 35.3 pg — AB (ref 26.0–34.0)
MCHC: 34.5 g/dL (ref 32.0–36.0)
MCV: 102.3 fL — AB (ref 80.0–100.0)
Platelets: 165 10*3/uL (ref 150–440)
RBC: 3.48 MIL/uL — ABNORMAL LOW (ref 4.40–5.90)
RDW: 14.4 % (ref 11.5–14.5)
WBC: 9.3 10*3/uL (ref 3.8–10.6)

## 2017-07-16 LAB — BASIC METABOLIC PANEL
Anion gap: 10 (ref 5–15)
BUN: 15 mg/dL (ref 6–20)
CO2: 21 mmol/L — ABNORMAL LOW (ref 22–32)
Calcium: 8.5 mg/dL — ABNORMAL LOW (ref 8.9–10.3)
Chloride: 107 mmol/L (ref 101–111)
Creatinine, Ser: 1.12 mg/dL (ref 0.61–1.24)
GFR calc Af Amer: >60 mL/min (ref >60)
GFR calc non Af Amer: 56 mL/min — ABNORMAL LOW (ref >60)
GLUCOSE: 129 mg/dL — AB (ref 65–99)
Potassium: 3.9 mmol/L (ref 3.5–5.1)
Sodium: 138 mmol/L (ref 135–145)

## 2017-07-16 LAB — PROTIME-INR
INR: 1.67
PROTHROMBIN TIME: 19.6 s — AB (ref 11.4–15.2)

## 2017-07-16 NOTE — Consult Note (Signed)
ORTHOPAEDIC CONSULTATION  REQUESTING PHYSICIAN: [name], MD  Chief Complaint:   R pelvic pain  History of Present Illness: [name] is a 84 y.o. male Who was working in his workshop when he tripped over his own foot and fell on the right side of his body.  He notes significant difficulty standing up afterwards.  He called EMS and x-rays in the emergency department showed a right superior and inferior pubic ramus fracture.  CT scan confirms these findings.  He notes that his pain is worse with any sort of weightbearing.  Pain is improved with rest.  Pain is described as a sharp throbbing sensation.  Pain is located deep within his pelvis region on the right side.  He is able to range his hip and knee without significant difficulty.  He was admitted for management of multiple medical comorbidities.  Past Medical History:  Diagnosis Date  . Cancer ([place])    PROSTATE  . Dysrhythmia    AFIB  . GERD (gastroesophageal reflux disease)   . Hypertension    Past Surgical History:  Procedure Laterality Date  . CAROTID ENDARTERECTOMY Left   . CATARACT EXTRACTION W/PHACO Right 10/13/2015   Procedure: CATARACT EXTRACTION PHACO AND INTRAOCULAR LENS PLACEMENT (IOC);  Surgeon: Leandrew Koyanagi, MD;  Location: [facility] ORS;  Service: Ophthalmology;  Laterality: Right;  Lot # [id number] H US:01:23.3 AP:17.2 CDE:14.31  . CATARACT EXTRACTION W/PHACO Left 12/12/2015   Procedure: CATARACT EXTRACTION PHACO AND INTRAOCULAR LENS PLACEMENT (IOC);  Surgeon: Leandrew Koyanagi, MD;  Location: [facility] ORS;  Service: Ophthalmology;  Laterality: Left;  [place] 1.10 AP% 15.9 CDE 11.22 FLUID PACK LOT # [id number]  . JOINT REPLACEMENT Right    TKR   Social History   Socioeconomic History  . Marital status: Married    Spouse name: None  . Number of children: None  . Years of education: None  . Highest education level: None  Social Needs  .  Financial resource strain: None  . Food insecurity - worry: None  . Food insecurity - inability: None  . Transportation needs - medical: None  . Transportation needs - non-medical: None  Occupational History  . None  Tobacco Use  . Smoking status: Former Research scientist (life sciences)  . Smokeless tobacco: Never Used  Substance and Sexual Activity  . Alcohol use: Yes    Comment: 1.5 oz daily - 6/3  last use  . Drug use: No  . Sexual activity: None  Other Topics Concern  . None  Social History Narrative  . None   No family history on file. No Known Allergies Prior to Admission medications   Medication Sig Start Date End Date Taking? Authorizing Provider  amLODipine (NORVASC) 5 MG tablet  07/23/16  Yes [provider]  Cholecalciferol (VITAMIN D-3 PO) Take by mouth.   Yes [provider]  diphenhydramine-acetaminophen (TYLENOL PM) 25-500 MG TABS tablet 1-1/2 tablets at bedtime.   Yes [provider]  lovastatin (MEVACOR) 40 MG tablet Take by mouth at bedtime.   Yes [provider]  Multiple Vitamin (MULTIVITAMIN) capsule Take 1 capsule by mouth daily.   Yes [provider]  pantoprazole (PROTONIX) 40 MG tablet TAKE 1 TABLET BY MOUTH EVERY DAY 10/24/15  Yes [provider]  terazosin (HYTRIN) 2 MG capsule Take 2 mg by mouth at bedtime. 2TABS DAILY   Yes [provider]  vitamin B-12 (CYANOCOBALAMIN) 1000 MCG tablet Take 1,000 mcg by mouth daily.   Yes [provider]  warfarin (COUMADIN) 2  MG tablet Take 4 mg by mouth daily. 2 TABS DAILY    Yes [provider]   Dg Sacrum/coccyx  Result Date: 07/15/2017 CLINICAL DATA:  84 year old male status post trip and fall landing on right hip with pain. EXAM: SACRUM AND COCCYX - 2+ VIEW COMPARISON:  Right hip series today reported separately. CT Abdomen and Pelvis 04/07/2012 FINDINGS: Osteopenia. Comminuted appearing fractures of the right inferior pubic ramus, and the lateral aspect of the  right superior pubic ramus at the junction with the right acetabulum (arrows). Visible left hemipelvis appears intact. Superimposed vascular calcifications, right iliac vascular stent, and small surgical clips or bone ankles at the symphysis pubis. Grossly intact visible lower lumbar levels. Grossly intact sacrum. IMPRESSION: 1. Comminuted appearing fractures of the right inferior pubic ramus, and the right superior pubic ramus bordering on the right acetabulum. 2. Osteopenia. Vascular calcifications and right iliac artery vascular stent. Electronically Signed   By: Genevie Ann M.D.   On: 07/15/2017 17:19   Ct Angio Chest Pe W And/or Wo Contrast  Result Date: 07/15/2017 CLINICAL DATA:  Acute onset of hypoxia and shortness of breath. Fall resulting in pelvic fractures today. Insert epic EXAM: CT ANGIOGRAPHY CHEST WITH CONTRAST TECHNIQUE: Multidetector CT imaging of the chest was performed using the standard protocol during bolus administration of intravenous contrast. Multiplanar CT image reconstructions and MIPs were obtained to evaluate the vascular anatomy. CONTRAST:  58mL ISOVUE-370 IOPAMIDOL (ISOVUE-370) INJECTION 76% COMPARISON:  Radiograph 07/15/2017 FINDINGS: Cardiovascular: There are no filling defects within the pulmonary arteries to suggest pulmonary embolus. Cardiomegaly. Dense coronary artery calcifications versus stents. Atherosclerosis of the thoracic aorta without dissection. No pericardial effusion. Mediastinum/Nodes: Shotty mediastinal nodes with 9 mm right upper paratracheal node. Smaller lower paratracheal in AP window nodes. No hilar adenopathy. The esophagus is decompressed. Lungs/Pleura: Dependent patchy and ground-glass opacities in both lower lobes. Possible minimal septal thickening. Minimal adherent mucus in the upper trachea, trachea and mainstem bronchi are otherwise patent. No confluent airspace disease. Small right pleural effusion versus pleural thickening. Upper Abdomen: Calcified  gallstones within physiologically distended gallbladder. Punctate hepatic granuloma. No acute finding. Musculoskeletal: No acute rib fracture. No acute fracture of the thoracic spine. Multilevel degenerative change. Review of the MIP images confirms the above findings. IMPRESSION: 1. No pulmonary embolus. 2. Dependent patchy and ground-glass opacities in the dependent lungs likely hypoventilatory atelectasis. A component of vascular congestion is also considered. Trace right pleural effusion versus pleural thickening. 3. Cardiomegaly with aortic atherosclerosis. Coronary artery calcifications versus stents. 4. Incidental finding of gallstones in the upper abdomen. Aortic Atherosclerosis (ICD10-I70.0). Electronically Signed   By: Jeb Levering M.D.   On: 07/15/2017 21:35   Ct Hip Right Wo Contrast  Result Date: 07/15/2017 CLINICAL DATA:  [place].  Known pubic rami fractures. EXAM: CT OF THE RIGHT HIP WITHOUT CONTRAST TECHNIQUE: Multidetector CT imaging of the right hip was performed according to the standard protocol. Multiplanar CT image reconstructions were also generated. COMPARISON:  Radiographs 07/16/2015 FINDINGS: As demonstrated on the plain films there are superior and inferior pubic rami fractures on the right side. The superior pubic ramus fracture is comminuted and mildly displaced and involves the anterior aspect of the acetabulum but no intra-articular component. Nondisplaced inferior pubic ramus fracture. No hip fracture. Moderate to advanced hip joint degenerative changes. No AVN. There is a small extraperitoneal pelvic hematoma. IMPRESSION: 1. Superior and inferior pubic rami fractures on the right side. The superior pubic ramus fracture is comminuted and mildly displaced. 2. No  hip fracture. 3. Small extraperitoneal pelvic hematoma. Electronically Signed   By: Marijo Sanes M.D.   On: 07/15/2017 18:21   Dg Chest Portable 1 View  Result Date: 07/15/2017 CLINICAL DATA:  Hip pain after fall.  Shortness of breath and chills. EXAM: PORTABLE CHEST 1 VIEW COMPARISON:  03/28/2014 FINDINGS: Coarse lung markings are suggestive for chronic changes. Heart size is mildly enlarged but unchanged. Atherosclerotic calcifications at the aortic arch. Trachea is midline. Negative for a pneumothorax. Bony structures appear to be intact. IMPRESSION: No active disease. Electronically Signed   By: Markus Daft M.D.   On: 07/15/2017 19:36   Dg Hip Unilat  With Pelvis 2-3 Views Right  Result Date: 07/15/2017 CLINICAL DATA:  Tripped today and fell.  Injured right hip. EXAM: DG HIP (WITH OR WITHOUT PELVIS) 2-3V RIGHT COMPARISON:  None. FINDINGS: Both hips are normally located. Moderate to advanced degenerative changes on the right and advanced degenerative changes on the left. No acute hip fracture is identified. The pubic symphysis and SI joints are intact. Right-sided superior and inferior pubic rami fractures are noted. Moderate vascular calcifications. A right iliac artery stent is noted. IMPRESSION: 1. No acute hip fracture. 2. Superior and inferior pubic rami fractures on the right side. 3. Bilateral hip joint degenerative changes, left greater than right. Electronically Signed   By: Marijo Sanes M.D.   On: 07/15/2017 17:18    Positive ROS: All other systems have been reviewed and were otherwise negative with the exception of those mentioned in the HPI and as above.  Physical Exam: BP 128/62 (BP Location: Left Arm)   Pulse 73   Temp 98 F (36.7 C) (Oral)   Resp 18   Ht 5\' 10"  (1.778 m)   Wt 87.5 kg (193 lb)   SpO2 94%   BMI 27.69 kg/m   General:  Alert, no acute distress Psychiatric:  Patient is competent for consent with normal mood and affect   Cardiovascular:  No pedal edema, regular rate and rhythm Respiratory:  No wheezing, non-labored breathing GI:  Abdomen is soft and non-tender Skin:  No lesions in the area of chief complaint, no erythema Neurologic:  Sensation intact distally, CN grossly  intact Lymphatic:  No axillary or cervical lymphadenopathy  Orthopedic Exam:  RLE:  5/5 DF/PF/EHL SILT s/s/t/sp/dp distr Foot wwp Neg logroll and axial load Hip RoM: 90 Flex, 20 ER, 10 IR, mild pain at extremes of RoM  X-rays:  As above: Right superior and inferior pubic ramus fracture.  There does not appear to be any significant sacral injury or SI joint widening apparent on x-rays.  CT scan: Confirms above findings without acetabular fracture.  The SI joints are not visualized.  Assessment/Plan: 84 yo M w/R superior and inferior pubic rami fractures. 1. WBAT on RLE 2. PT/OT  3. No plan for surgical intervention 4. DVT ppx per primary team 5. F/U as outpatient in ~2 weeks.  6. Please page with further questions. Will follow peripherally.     [name]   07/16/2017 9:23 AM

## 2017-07-16 NOTE — Progress Notes (Addendum)
[facility] [place] NAME: [name]    MR#:  [id number]  DATE OF BIRTH:  07/04/1926  SUBJECTIVE: Admitted for fall and found to have right superior and inferior ramus pubic ramus fracture.  Having a lot of pain with minimal movement.  Seen by orthopedic, conservative management advised.  Patient today still complains of significant pain and wants to go to rehab and he is very frustrated about the pain and not getting rehab placement.  CHIEF COMPLAINT:   Chief Complaint  Patient presents with  . Fall    REVIEW OF SYSTEMS:   ROS CONSTITUTIONAL: No fever, fatigue or weakness.  EYES: No blurred or double vision.  EARS, NOSE, AND THROAT: No tinnitus or ear pain.  RESPIRATORY: No cough, shortness of breath, wheezing or hemoptysis.  CARDIOVASCULAR: No chest pain, orthopnea, edema.  GASTROINTESTINAL: No nausea, vomiting, diarrhea or abdominal pain.  GENITOURINARY: No dysuria, hematuria.  ENDOCRINE: No polyuria, nocturia,  HEMATOLOGY: No anemia, easy bruising or bleeding SKIN: No rash or lesion. MUSCULOSKELETAL: Significant right hip pain  nEUROLOGIC: No tingling, numbness, weakness.  PSYCHIATRY: No anxiety or depression.   DRUG ALLERGIES:  No Known Allergies  VITALS:  Blood pressure 128/62, pulse 73, temperature 98 F (36.7 C), temperature source Oral, resp. rate 18, height 5\' 10"  (1.778 m), weight 87.5 kg (193 lb), SpO2 94 %.  PHYSICAL EXAMINATION:  GENERAL:  84 y.o.-year-old patient lying in the bed with no acute distress.  EYES: Pupils equal, round, reactive to light . No scleral icterus. Extraocular muscles intact.  HEENT: Head atraumatic, normocephalic. Oropharynx and nasopharynx clear .  NECK:  Supple, no jugular venous distention. No thyroid enlargement, no tenderness.  LUNGS: Normal breath sounds bilaterally, no wheezing, rales,rhonchi or crepitation. No use of accessory muscles of respiration.  CARDIOVASCULAR: S1, S2  normal. No murmurs, rubs, or gallops.  ABDOMEN: Soft, nontender, nondistended. Bowel sounds present. No organomegaly or mass.  EXTREMITIES: No pedal edema, cyanosis, or clubbing.  Unable to lift right leg because of severe pain in the right hip area NEUROLOGIC: Cranial nerves II through XII are intact. Muscle strength 5/5 in all extremities. Sensation intact. Gait not checked.  PSYCHIATRIC: The patient is alert and oriented x 3.  SKIN: No obvious rash, lesion, or ulcer.    LABORATORY PANEL:   CBC Recent Labs  Lab 07/16/17 0444  WBC 9.3  HGB 12.3*  HCT 35.6*  PLT 165   ------------------------------------------------------------------------------------------------------------------  Chemistries  Recent Labs  Lab 07/16/17 0444  NA 138  K 3.9  CL 107  CO2 21*  GLUCOSE 129*  BUN 15  CREATININE 1.12  CALCIUM 8.5*   ------------------------------------------------------------------------------------------------------------------  Cardiac Enzymes Recent Labs  Lab 07/15/17 1937  TROPONINI <0.03   ------------------------------------------------------------------------------------------------------------------  RADIOLOGY:  Dg Sacrum/coccyx  Result Date: 07/15/2017 CLINICAL DATA:  84 year old male status post trip and fall landing on right hip with pain. EXAM: SACRUM AND COCCYX - 2+ VIEW COMPARISON:  Right hip series today reported separately. CT Abdomen and Pelvis 04/07/2012 FINDINGS: Osteopenia. Comminuted appearing fractures of the right inferior pubic ramus, and the lateral aspect of the right superior pubic ramus at the junction with the right acetabulum (arrows). Visible left hemipelvis appears intact. Superimposed vascular calcifications, right iliac vascular stent, and small surgical clips or bone ankles at the symphysis pubis. Grossly intact visible lower lumbar levels. Grossly intact sacrum. IMPRESSION: 1. Comminuted appearing fractures of the right inferior pubic ramus,  and the right superior pubic ramus bordering  on the right acetabulum. 2. Osteopenia. Vascular calcifications and right iliac artery vascular stent. Electronically Signed   By: Genevie Ann M.D.   On: 07/15/2017 17:19   Ct Angio Chest Pe W And/or Wo Contrast  Result Date: 07/15/2017 CLINICAL DATA:  Acute onset of hypoxia and shortness of breath. Fall resulting in pelvic fractures today. Insert epic EXAM: CT ANGIOGRAPHY CHEST WITH CONTRAST TECHNIQUE: Multidetector CT imaging of the chest was performed using the standard protocol during bolus administration of intravenous contrast. Multiplanar CT image reconstructions and MIPs were obtained to evaluate the vascular anatomy. CONTRAST:  9mL ISOVUE-370 IOPAMIDOL (ISOVUE-370) INJECTION 76% COMPARISON:  Radiograph 07/15/2017 FINDINGS: Cardiovascular: There are no filling defects within the pulmonary arteries to suggest pulmonary embolus. Cardiomegaly. Dense coronary artery calcifications versus stents. Atherosclerosis of the thoracic aorta without dissection. No pericardial effusion. Mediastinum/Nodes: Shotty mediastinal nodes with 9 mm right upper paratracheal node. Smaller lower paratracheal in AP window nodes. No hilar adenopathy. The esophagus is decompressed. Lungs/Pleura: Dependent patchy and ground-glass opacities in both lower lobes. Possible minimal septal thickening. Minimal adherent mucus in the upper trachea, trachea and mainstem bronchi are otherwise patent. No confluent airspace disease. Small right pleural effusion versus pleural thickening. Upper Abdomen: Calcified gallstones within physiologically distended gallbladder. Punctate hepatic granuloma. No acute finding. Musculoskeletal: No acute rib fracture. No acute fracture of the thoracic spine. Multilevel degenerative change. Review of the MIP images confirms the above findings. IMPRESSION: 1. No pulmonary embolus. 2. Dependent patchy and ground-glass opacities in the dependent lungs likely hypoventilatory  atelectasis. A component of vascular congestion is also considered. Trace right pleural effusion versus pleural thickening. 3. Cardiomegaly with aortic atherosclerosis. Coronary artery calcifications versus stents. 4. Incidental finding of gallstones in the upper abdomen. Aortic Atherosclerosis (ICD10-I70.0). Electronically Signed   By: Jeb Levering M.D.   On: 07/15/2017 21:35   Ct Hip Right Wo Contrast  Result Date: 07/15/2017 CLINICAL DATA:  [place].  Known pubic rami fractures. EXAM: CT OF THE RIGHT HIP WITHOUT CONTRAST TECHNIQUE: Multidetector CT imaging of the right hip was performed according to the standard protocol. Multiplanar CT image reconstructions were also generated. COMPARISON:  Radiographs 07/16/2015 FINDINGS: As demonstrated on the plain films there are superior and inferior pubic rami fractures on the right side. The superior pubic ramus fracture is comminuted and mildly displaced and involves the anterior aspect of the acetabulum but no intra-articular component. Nondisplaced inferior pubic ramus fracture. No hip fracture. Moderate to advanced hip joint degenerative changes. No AVN. There is a small extraperitoneal pelvic hematoma. IMPRESSION: 1. Superior and inferior pubic rami fractures on the right side. The superior pubic ramus fracture is comminuted and mildly displaced. 2. No hip fracture. 3. Small extraperitoneal pelvic hematoma. Electronically Signed   By: Marijo Sanes M.D.   On: 07/15/2017 18:21   Dg Chest Portable 1 View  Result Date: 07/15/2017 CLINICAL DATA:  Hip pain after fall. Shortness of breath and chills. EXAM: PORTABLE CHEST 1 VIEW COMPARISON:  03/28/2014 FINDINGS: Coarse lung markings are suggestive for chronic changes. Heart size is mildly enlarged but unchanged. Atherosclerotic calcifications at the aortic arch. Trachea is midline. Negative for a pneumothorax. Bony structures appear to be intact. IMPRESSION: No active disease. Electronically Signed   By: Markus Daft  M.D.   On: 07/15/2017 19:36   Dg Hip Unilat  With Pelvis 2-3 Views Right  Result Date: 07/15/2017 CLINICAL DATA:  Tripped today and fell.  Injured right hip. EXAM: DG HIP (WITH OR WITHOUT PELVIS) 2-3V RIGHT COMPARISON:  None. FINDINGS: Both hips are normally located. Moderate to advanced degenerative changes on the right and advanced degenerative changes on the left. No acute hip fracture is identified. The pubic symphysis and SI joints are intact. Right-sided superior and inferior pubic rami fractures are noted. Moderate vascular calcifications. A right iliac artery stent is noted. IMPRESSION: 1. No acute hip fracture. 2. Superior and inferior pubic rami fractures on the right side. 3. Bilateral hip joint degenerative changes, left greater than right. Electronically Signed   By: Marijo Sanes M.D.   On: 07/15/2017 17:18    EKG:  No orders found for this or any previous visit.  ASSESSMENT AND PLAN:   #1 .right superior, inferior pubic ramus fracture: Continue Norco, added IV Dilaudid, because of significant pain, physical therapy recommended skilled nursing.,  Appreciate social worker consult.  2 .chronic atrial fibrillation: Rate controlled.  Continue anticoagulation.  Pharmacy to dose anticoagulation.  INR is still less than 2.  3.  Essential hypertension: Controlled.  Continue Norvasc.  All the records are reviewed and case discussed with Care Management/Social Workerr. Management plans discussed with the patient, family and they are in agreement.  CODE STATUS: full  TOTAL TIME TAKING CARE OF THIS PATIENT: 35 minutes.   POSSIBLE D/C IN  1-2DAYS, DEPENDING ON CLINICAL CONDITION.   [name] M.D on 07/16/2017 at 12:00 PM  Between 7am to 6pm - Pager - [phone]  After 6pm go to [email or web address] - password [email or web address] [facility]  Office  [phone]  CC: Primary care physician; Adin Hector, MD   Note: This dictation was prepared with Dragon  dictation along with smaller phrase technology. Any transcriptional errors that result from this process are unintentional.

## 2017-07-16 NOTE — Evaluation (Signed)
Physical Therapy Evaluation Patient Details Name: [name] MRN: [id number] DOB: 11-18-1925 Today's Date: 07/16/2017   History of Present Illness  84 y.o. male Who was working in his workshop when he tripped over his own foot and fell on the right side of his body.  Pt found to have suffered superior and inferior pubic rami fracture.  Clinical Impression  Pt willing to participate with PT and showed good effort, but was very pain limited with all movement.  He was able to do some light AAROM exercises apart from the exam but had a lot of pain and generally realized that despite his desire to go home he is too functionally limited secondary to pain, mobility, and weakness.  Pt will require STR unless he makes considerable functional gains in the next 1-2 days.    Follow Up Recommendations SNF    Equipment Recommendations       Recommendations for Other Services       Precautions / Restrictions Precautions Precautions: Fall Restrictions Weight Bearing Restrictions: Yes RLE Weight Bearing: Weight bearing as tolerated      Mobility  Bed Mobility Overal bed mobility: Needs Assistance Bed Mobility: Supine to Sit;Sit to Supine     Supine to sit: Max assist Sit to supine: Max assist   General bed mobility comments: Pt showed good effort, but was pain limited and needed considerable assist to even initiate movmenet  Transfers Overall transfer level: Needs assistance   Transfers: Sit to/from Stand Sit to Stand: Max assist;Mod assist         General transfer comment: Pt struggled to get to standing and was heavily reliant on the walker and ultimately struggled to maintain standing much less take a step.   Ambulation/Gait             General Gait Details: Pt unable to initiate even a small shuffle step in standing, needed to quickly sit back down on bed (unable to get to recliner)  Stairs            Wheelchair Mobility    Modified Rankin (Stroke  Patients Only)       Balance Overall balance assessment: Needs assistance Sitting-balance support: Bilateral upper extremity supported Sitting balance-Leahy Scale: Fair Sitting balance - Comments: pain limited in sitting, but able to maintain balance   Standing balance support: Bilateral upper extremity supported Standing balance-Leahy Scale: Poor Standing balance comment: reliant on walker, pain limited                             Pertinent Vitals/Pain Pain Assessment: 0-10 Pain Score: 5 (at rest, increased greatly with any R LE movement)    Home Living Family/patient expects to be discharged to:: Private residence Living Arrangements: Spouse/significant other Available Help at Discharge: Family Type of Home: House Home Access: Stairs to enter Entrance Stairs-Rails: Psychiatric nurse of Steps: 3   Home Equipment: Environmental consultant - 2 wheels;Cane - single point      Prior Function Level of Independence: Independent         Comments: Pt was able to be very active and independnet     Hand Dominance        Extremity/Trunk Assessment   Upper Extremity Assessment Upper Extremity Assessment: Overall WFL for tasks assessed    Lower Extremity Assessment Lower Extremity Assessment: Overall WFL for tasks assessed(except R LE very pain limited secondary to pain)       Communication  Communication: No difficulties  Cognition Arousal/Alertness: Awake/alert Behavior During Therapy: WFL for tasks assessed/performed Overall Cognitive Status: Within Functional Limits for tasks assessed                                        General Comments      Exercises General Exercises - Lower Extremity Ankle Circles/Pumps: Strengthening;10 reps Heel Slides: AAROM;5 reps Hip ABduction/ADduction: AAROM;5 reps Straight Leg Raises: PROM;5 reps   Assessment/Plan    PT Assessment Patient needs continued PT services  PT Problem List Decreased  strength;Decreased range of motion;Decreased activity tolerance;Decreased balance;Decreased mobility;Decreased knowledge of use of DME;Decreased safety awareness;Pain;Decreased coordination       PT Treatment Interventions Gait training;Functional mobility training;DME instruction;Therapeutic activities;Therapeutic exercise;Balance training;Patient/family education;Neuromuscular re-education    PT Goals (Current goals can be found in the Care Plan section)  Acute Rehab PT Goals Patient Stated Goal: Pt very much wants to go home, realizes this is not a safe option PT Goal Formulation: With patient Time For Goal Achievement: 07/30/17 Potential to Achieve Goals: Fair    Frequency 7X/week   Barriers to discharge        Co-evaluation               AM-PAC PT "6 Clicks" Daily Activity  Outcome Measure Difficulty turning over in bed (including adjusting bedclothes, sheets and blankets)?: Unable Difficulty moving from lying on back to sitting on the side of the bed? : Unable Difficulty sitting down on and standing up from a chair with arms (e.g., wheelchair, bedside commode, etc,.)?: Unable Help needed moving to and from a bed to chair (including a wheelchair)?: Total Help needed walking in hospital room?: Total Help needed climbing 3-5 steps with a railing? : Total 6 Click Score: 6    End of Session Equipment Utilized During Treatment: Gait belt Activity Tolerance: Patient tolerated treatment well Patient left: with bed alarm set;with call bell/phone within reach   PT Visit Diagnosis: Muscle weakness (generalized) (M62.81);Difficulty in walking, not elsewhere classified (R26.2)    Time: 3299-2426 PT Time Calculation (min) (ACUTE ONLY): 29 min   Charges:   PT Evaluation $PT Eval Low Complexity: 1 Low PT Treatments $Therapeutic Exercise: 8-22 mins   PT G Codes:        Kreg Shropshire, DPT 07/16/2017, 5:51 PM

## 2017-07-17 DIAGNOSIS — S32511D Fracture of superior rim of right pubis, subsequent encounter for fracture with routine healing: Secondary | ICD-10-CM | POA: Diagnosis not present

## 2017-07-17 DIAGNOSIS — W19XXXD Unspecified fall, subsequent encounter: Secondary | ICD-10-CM | POA: Diagnosis not present

## 2017-07-17 DIAGNOSIS — I10 Essential (primary) hypertension: Secondary | ICD-10-CM | POA: Diagnosis not present

## 2017-07-17 DIAGNOSIS — E559 Vitamin D deficiency, unspecified: Secondary | ICD-10-CM | POA: Diagnosis not present

## 2017-07-17 DIAGNOSIS — Z7901 Long term (current) use of anticoagulants: Secondary | ICD-10-CM | POA: Diagnosis not present

## 2017-07-17 DIAGNOSIS — E785 Hyperlipidemia, unspecified: Secondary | ICD-10-CM | POA: Diagnosis not present

## 2017-07-17 DIAGNOSIS — M8000XD Age-related osteoporosis with current pathological fracture, unspecified site, subsequent encounter for fracture with routine healing: Secondary | ICD-10-CM | POA: Diagnosis not present

## 2017-07-17 DIAGNOSIS — I482 Chronic atrial fibrillation: Secondary | ICD-10-CM | POA: Diagnosis not present

## 2017-07-17 DIAGNOSIS — I480 Paroxysmal atrial fibrillation: Secondary | ICD-10-CM | POA: Diagnosis not present

## 2017-07-17 DIAGNOSIS — Z87891 Personal history of nicotine dependence: Secondary | ICD-10-CM | POA: Diagnosis not present

## 2017-07-17 DIAGNOSIS — Z5181 Encounter for therapeutic drug level monitoring: Secondary | ICD-10-CM | POA: Diagnosis not present

## 2017-07-17 DIAGNOSIS — I517 Cardiomegaly: Secondary | ICD-10-CM | POA: Diagnosis not present

## 2017-07-17 DIAGNOSIS — Z8546 Personal history of malignant neoplasm of prostate: Secondary | ICD-10-CM | POA: Diagnosis not present

## 2017-07-17 DIAGNOSIS — M25559 Pain in unspecified hip: Secondary | ICD-10-CM | POA: Diagnosis not present

## 2017-07-17 DIAGNOSIS — E538 Deficiency of other specified B group vitamins: Secondary | ICD-10-CM | POA: Diagnosis not present

## 2017-07-17 DIAGNOSIS — J9601 Acute respiratory failure with hypoxia: Secondary | ICD-10-CM | POA: Diagnosis not present

## 2017-07-17 DIAGNOSIS — R0902 Hypoxemia: Secondary | ICD-10-CM | POA: Diagnosis not present

## 2017-07-17 DIAGNOSIS — Z9981 Dependence on supplemental oxygen: Secondary | ICD-10-CM | POA: Diagnosis not present

## 2017-07-17 DIAGNOSIS — M62838 Other muscle spasm: Secondary | ICD-10-CM | POA: Diagnosis not present

## 2017-07-17 DIAGNOSIS — L89629 Pressure ulcer of left heel, unspecified stage: Secondary | ICD-10-CM | POA: Diagnosis not present

## 2017-07-17 DIAGNOSIS — L89610 Pressure ulcer of right heel, unstageable: Secondary | ICD-10-CM | POA: Diagnosis not present

## 2017-07-17 DIAGNOSIS — K219 Gastro-esophageal reflux disease without esophagitis: Secondary | ICD-10-CM | POA: Diagnosis not present

## 2017-07-17 DIAGNOSIS — I700 Atherosclerosis of aorta: Secondary | ICD-10-CM | POA: Diagnosis not present

## 2017-07-17 DIAGNOSIS — L89620 Pressure ulcer of left heel, unstageable: Secondary | ICD-10-CM | POA: Diagnosis not present

## 2017-07-17 DIAGNOSIS — S329XXA Fracture of unspecified parts of lumbosacral spine and pelvis, initial encounter for closed fracture: Secondary | ICD-10-CM | POA: Diagnosis not present

## 2017-07-17 DIAGNOSIS — L89619 Pressure ulcer of right heel, unspecified stage: Secondary | ICD-10-CM | POA: Diagnosis not present

## 2017-07-17 DIAGNOSIS — I509 Heart failure, unspecified: Secondary | ICD-10-CM | POA: Diagnosis not present

## 2017-07-17 LAB — PROTIME-INR
INR: 1.45
Prothrombin Time: 17.5 seconds — ABNORMAL HIGH (ref 11.4–15.2)

## 2017-07-17 LAB — GLUCOSE, CAPILLARY: Glucose-Capillary: 101 mg/dL — ABNORMAL HIGH (ref 65–99)

## 2017-07-17 MED ORDER — BISACODYL 10 MG RE SUPP
10.0000 mg | Freq: Once | RECTAL | Status: AC
  Administered 2017-07-17: 10 mg via RECTAL
  Filled 2017-07-17: qty 1

## 2017-07-17 MED ORDER — HYDROCODONE-ACETAMINOPHEN 5-325 MG PO TABS: 1.0000 | ORAL_TABLET | ORAL | 0 refills | Status: DC | PRN

## 2017-07-17 MED ORDER — METHOCARBAMOL 500 MG PO TABS: 500.0000 mg | ORAL_TABLET | Freq: Four times a day (QID) | ORAL | Status: DC | PRN

## 2017-07-17 MED ORDER — METHOCARBAMOL 500 MG PO TABS: 500.0000 mg | ORAL_TABLET | Freq: Four times a day (QID) | ORAL | 0 refills | Status: DC | PRN

## 2017-07-17 MED ORDER — HYDROMORPHONE HCL 1 MG/ML IJ SOLN: 1.0000 mg | INTRAMUSCULAR | Status: DC | PRN

## 2017-07-17 MED ORDER — BISACODYL 5 MG PO TBEC: 5.0000 mg | DELAYED_RELEASE_TABLET | Freq: Every day | ORAL | 0 refills | Status: DC | PRN

## 2017-07-17 NOTE — Progress Notes (Signed)
Report called to [facility]. Report taken by Levada Dy.

## 2017-07-17 NOTE — Consult Note (Signed)
   [facility] CM Inpatient Consult   07/17/2017  [name] [birth year] [id number]   Patient screened for potential [place] Management services. Patient is on the [facility] registry as a benefit of their [employer] medicare. Electronic medical record reveals patient's discharge plan is rehab at [facility]. [facility] Care Management services not appropriate at this time. If patient's post hospital needs change please place a [facility] Care Management consult. For questions please contact:   Ekin Pilar RN, [facility] Liaison  [phone]) Business Mobile [phone]) Toll free office

## 2017-07-17 NOTE — Progress Notes (Signed)
Patient now decided to go to rehab, he will go to [facility] today.

## 2017-07-17 NOTE — Discharge Summary (Addendum)
[name], is a 84 y.o. male  DOB Oct 25, 1925  MRN [id number].  Admission date:  07/15/2017  Admitting Physician  Amelia Jo, MD  Discharge Date:  07/17/2017   Primary MD  Adin Hector, MD  Recommendations for primary care physician for things to follow:   follow With PCP in 1 week   Admission Diagnosis  Hypoxia [R09.02] Closed fracture of ramus of right pubis, initial encounter [facility]) [S32.591A]   Discharge Diagnosis  Hypoxia [R09.02] Closed fracture of ramus of right pubis, initial encounter ([place]) [S32.591A]   Active Problems:   Fracture closed, pubis ([place])      Past Medical History:  Diagnosis Date  . Cancer ([place])    PROSTATE  . Dysrhythmia    AFIB  . GERD (gastroesophageal reflux disease)   . Hypertension     Past Surgical History:  Procedure Laterality Date  . CAROTID ENDARTERECTOMY Left   . CATARACT EXTRACTION W/PHACO Right 10/13/2015   Procedure: CATARACT EXTRACTION PHACO AND INTRAOCULAR LENS PLACEMENT (IOC);  Surgeon: Leandrew Koyanagi, MD;  Location: [facility] ORS;  Service: Ophthalmology;  Laterality: Right;  Lot # [id number] H US:01:23.3 AP:17.2 CDE:14.31  . CATARACT EXTRACTION W/PHACO Left 12/12/2015   Procedure: CATARACT EXTRACTION PHACO AND INTRAOCULAR LENS PLACEMENT (IOC);  Surgeon: Leandrew Koyanagi, MD;  Location: [facility] ORS;  Service: Ophthalmology;  Laterality: Left;  [place] 1.10 AP% 15.9 CDE 11.22 FLUID PACK LOT # [id number]  . JOINT REPLACEMENT Right    TKR       History of present illness and  Hospital Course:     Kindly see H&P for history of present illness and admission details, please review complete Labs, Consult reports and Test reports for all details in brief  HPI  from the history and physical done on the day of admission 84 year old male patient admitted because of fall  and found to have right superior, inferior pubic ramus fracture and admitted for pain control, physical therapy.   Hospital Course  1. acute right superior and inferior pubic ramus fracture: Seen by orthopedic, conservative management advised, seen by physical therapy, patient continued on pain medicines, patient still has lots of pain, physical therapy recommended skilled nursing  initially patient refused to go to rehab and wanted to go home but later on decided to go to rehab,patient will go to [facility] essential hypertension: Controlled, continue Norvasc 3.  Chronic atrial fibrillation: Continue anticoagulation with Coumadin.  Controlled.      Discharge Condition stable   Follow UP  Follow-up Information    [name], MD. Schedule an appointment as soon as possible for a visit in 1 week(s).   Specialty:  Internal Medicine Contact information: [place] [address] [phone]        [name], MD. Schedule an appointment as soon as possible for a visit in 1 week(s).   Specialty:  Orthopedic Surgery Contact information: [place] [address] [phone]             Discharge Instructions  and  Discharge Medications     Discharge Instructions    Face-to-face encounter (required for Medicare/Medicaid patients)   Complete by:  As directed    I [name] certify that this patient is under my care and that I, or a nurse practitioner or physician's assistant working with me, had a face-to-face encounter that meets the physician face-to-face encounter requirements with this patient on 07/17/2017. The encounter  with the patient was in whole, or in part for the following medical condition(s) which is the primary reason for home health care  Right superior, inferior pubic ramus fractures Hypertension Proximal atrial fibrillation Hyperlipidemia GERD   The encounter with the  patient was in whole, or in part, for the following medical condition, which is the primary reason for home health care:  whole   I certify that, based on my findings, the following services are medically necessary home health services:  Physical therapy   Reason for Medically Necessary Home Health Services:  Therapy- Personnel officer, Public librarian   My clinical findings support the need for the above services:  Unable to leave home safely without assistance and/or assistive device   Further, I certify that my clinical findings support that this patient is homebound due to:  Unable to leave home safely without assistance   Home Health   Complete by:  As directed    To provide the following care/treatments:   PT Home Health Aide       Allergies as of 07/17/2017   No Known Allergies     Medication List    TAKE these medications   amLODipine 5 MG tablet Commonly known as:  NORVASC   bisacodyl 5 MG EC tablet Commonly known as:  DULCOLAX Take 1 tablet (5 mg total) by mouth daily as needed for moderate constipation.   diphenhydramine-acetaminophen 25-500 MG Tabs tablet Commonly known as:  TYLENOL PM 1-1/2 tablets at bedtime.   HYDROcodone-acetaminophen 5-325 MG tablet Commonly known as:  NORCO/VICODIN Take 1-2 tablets by mouth every 4 (four) hours as needed for moderate pain.   lovastatin 40 MG tablet Commonly known as:  MEVACOR Take by mouth at bedtime.   methocarbamol 500 MG tablet Commonly known as:  ROBAXIN Take 1 tablet (500 mg total) by mouth every 6 (six) hours as needed for muscle spasms.   multivitamin capsule Take 1 capsule by mouth daily.   pantoprazole 40 MG tablet Commonly known as:  PROTONIX TAKE 1 TABLET BY MOUTH EVERY DAY   terazosin 2 MG capsule Commonly known as:  HYTRIN Take 2 mg by mouth at bedtime. 2TABS DAILY   vitamin B-12 1000 MCG tablet Commonly known as:  CYANOCOBALAMIN Take 1,000 mcg by mouth daily.   VITAMIN D-3  PO Take by mouth.   warfarin 2 MG tablet Commonly known as:  COUMADIN Take 4 mg by mouth daily. 2 TABS DAILY         Diet and Activity recommendation: See Discharge Instructions above   Consults obtained  Orthopedic, physical therapy, case manager   Major procedures and Radiology Reports - PLEASE review detailed and final reports for all details, in brief -      Dg Sacrum/coccyx  Result Date: 07/15/2017 CLINICAL DATA:  84 year old male status post trip and fall landing on right hip with pain. EXAM: SACRUM AND COCCYX - 2+ VIEW COMPARISON:  Right hip series today reported separately. CT Abdomen and Pelvis 04/07/2012 FINDINGS: Osteopenia. Comminuted appearing fractures of the right inferior pubic ramus, and the lateral aspect of the right superior pubic ramus at the junction with the right acetabulum (arrows). Visible left hemipelvis appears intact. Superimposed vascular calcifications, right iliac vascular stent, and small surgical clips or bone ankles at the symphysis pubis. Grossly intact visible lower lumbar levels. Grossly intact sacrum. IMPRESSION: 1. Comminuted appearing fractures of the right inferior pubic ramus, and the right superior pubic ramus bordering on the right acetabulum. 2.  Osteopenia. Vascular calcifications and right iliac artery vascular stent. Electronically Signed   By: Genevie Ann M.D.   On: 07/15/2017 17:19   Ct Angio Chest Pe W And/or Wo Contrast  Result Date: 07/15/2017 CLINICAL DATA:  Acute onset of hypoxia and shortness of breath. Fall resulting in pelvic fractures today. Insert epic EXAM: CT ANGIOGRAPHY CHEST WITH CONTRAST TECHNIQUE: Multidetector CT imaging of the chest was performed using the standard protocol during bolus administration of intravenous contrast. Multiplanar CT image reconstructions and MIPs were obtained to evaluate the vascular anatomy. CONTRAST:  72mL ISOVUE-370 IOPAMIDOL (ISOVUE-370) INJECTION 76% COMPARISON:  Radiograph 07/15/2017 FINDINGS:  Cardiovascular: There are no filling defects within the pulmonary arteries to suggest pulmonary embolus. Cardiomegaly. Dense coronary artery calcifications versus stents. Atherosclerosis of the thoracic aorta without dissection. No pericardial effusion. Mediastinum/Nodes: Shotty mediastinal nodes with 9 mm right upper paratracheal node. Smaller lower paratracheal in AP window nodes. No hilar adenopathy. The esophagus is decompressed. Lungs/Pleura: Dependent patchy and ground-glass opacities in both lower lobes. Possible minimal septal thickening. Minimal adherent mucus in the upper trachea, trachea and mainstem bronchi are otherwise patent. No confluent airspace disease. Small right pleural effusion versus pleural thickening. Upper Abdomen: Calcified gallstones within physiologically distended gallbladder. Punctate hepatic granuloma. No acute finding. Musculoskeletal: No acute rib fracture. No acute fracture of the thoracic spine. Multilevel degenerative change. Review of the MIP images confirms the above findings. IMPRESSION: 1. No pulmonary embolus. 2. Dependent patchy and ground-glass opacities in the dependent lungs likely hypoventilatory atelectasis. A component of vascular congestion is also considered. Trace right pleural effusion versus pleural thickening. 3. Cardiomegaly with aortic atherosclerosis. Coronary artery calcifications versus stents. 4. Incidental finding of gallstones in the upper abdomen. Aortic Atherosclerosis (ICD10-I70.0). Electronically Signed   By: Jeb Levering M.D.   On: 07/15/2017 21:35   Ct Hip Right Wo Contrast  Result Date: 07/15/2017 CLINICAL DATA:  [place].  Known pubic rami fractures. EXAM: CT OF THE RIGHT HIP WITHOUT CONTRAST TECHNIQUE: Multidetector CT imaging of the right hip was performed according to the standard protocol. Multiplanar CT image reconstructions were also generated. COMPARISON:  Radiographs 07/16/2015 FINDINGS: As demonstrated on the plain films there are  superior and inferior pubic rami fractures on the right side. The superior pubic ramus fracture is comminuted and mildly displaced and involves the anterior aspect of the acetabulum but no intra-articular component. Nondisplaced inferior pubic ramus fracture. No hip fracture. Moderate to advanced hip joint degenerative changes. No AVN. There is a small extraperitoneal pelvic hematoma. IMPRESSION: 1. Superior and inferior pubic rami fractures on the right side. The superior pubic ramus fracture is comminuted and mildly displaced. 2. No hip fracture. 3. Small extraperitoneal pelvic hematoma. Electronically Signed   By: Marijo Sanes M.D.   On: 07/15/2017 18:21   Dg Chest Portable 1 View  Result Date: 07/15/2017 CLINICAL DATA:  Hip pain after fall. Shortness of breath and chills. EXAM: PORTABLE CHEST 1 VIEW COMPARISON:  03/28/2014 FINDINGS: Coarse lung markings are suggestive for chronic changes. Heart size is mildly enlarged but unchanged. Atherosclerotic calcifications at the aortic arch. Trachea is midline. Negative for a pneumothorax. Bony structures appear to be intact. IMPRESSION: No active disease. Electronically Signed   By: Markus Daft M.D.   On: 07/15/2017 19:36   Dg Hip Unilat  With Pelvis 2-3 Views Right  Result Date: 07/15/2017 CLINICAL DATA:  Tripped today and fell.  Injured right hip. EXAM: DG HIP (WITH OR WITHOUT PELVIS) 2-3V RIGHT COMPARISON:  None. FINDINGS: Both hips  are normally located. Moderate to advanced degenerative changes on the right and advanced degenerative changes on the left. No acute hip fracture is identified. The pubic symphysis and SI joints are intact. Right-sided superior and inferior pubic rami fractures are noted. Moderate vascular calcifications. A right iliac artery stent is noted. IMPRESSION: 1. No acute hip fracture. 2. Superior and inferior pubic rami fractures on the right side. 3. Bilateral hip joint degenerative changes, left greater than right. Electronically Signed    By: Marijo Sanes M.D.   On: 07/15/2017 17:18    Micro Results    No results found for this or any previous visit (from the past 240 hour(s)).     Today   Subjective:   Elzia Hott today has right hip pain, wants to go home,  Objective:   Blood pressure (!) 128/55, pulse 61, temperature 97.6 F (36.4 C), temperature source Oral, resp. rate 18, height 5\' 10"  (1.778 m), weight 87.5 kg (193 lb), SpO2 95 %.   Intake/Output Summary (Last 24 hours) at 07/17/2017 1144 Last data filed at 07/17/2017 0802 Gross per 24 hour  Intake 480 ml  Output 250 ml  Net 230 ml    Exam Awake Alert, Oriented x 3, No new F.N deficits, Normal affect George.AT,PERRAL Supple Neck,No JVD, No cervical lymphadenopathy appriciated.  Symmetrical Chest wall movement, Good air movement bilaterally, CTAB RRR,No Gallops,Rubs or new Murmurs, No Parasternal Heave +ve B.Sounds, Abd Soft, Non tender, No organomegaly appriciated, No rebound -guarding or rigidity. No Cyanosis, Clubbing or edema, No new Rash or bruise, unable to rise right leg secondary to right hip pain.  Data Review   CBC w Diff:  Lab Results  Component Value Date   WBC 9.3 07/16/2017   HGB 12.3 (L) 07/16/2017   HGB 11.6 (L) 03/28/2014   HCT 35.6 (L) 07/16/2017   HCT 34.3 (L) 03/28/2014   PLT 165 07/16/2017   PLT 171 03/28/2014   LYMPHOPCT 7 07/15/2017   LYMPHOPCT 18.4 04/09/2012   MONOPCT 3 07/15/2017   MONOPCT 8.5 04/09/2012   EOSPCT 0 07/15/2017   EOSPCT 1.3 04/09/2012   BASOPCT 0 07/15/2017   BASOPCT 0.4 04/09/2012    CMP:  Lab Results  Component Value Date   NA 138 07/16/2017   NA 140 03/28/2014   K 3.9 07/16/2017   K 4.0 03/28/2014   CL 107 07/16/2017   CL 109 (H) 03/28/2014   CO2 21 (L) 07/16/2017   CO2 24 03/28/2014   BUN 15 07/16/2017   BUN 15 03/28/2014   CREATININE 1.12 07/16/2017   CREATININE 1.41 (H) 03/28/2014   PROT 7.0 03/28/2014   ALBUMIN 4.0 03/28/2014   BILITOT 0.4 03/28/2014   ALKPHOS 74 03/28/2014    AST 20 03/28/2014   ALT 19 03/28/2014  .   Total Time in preparing paper work, data evaluation and todays exam - 35 minutes  [name] M.D on 07/17/2017 at 11:44 AM    Note: This dictation was prepared with Dragon dictation along with smaller phrase technology. Any transcriptional errors that result from this process are unintentional.

## 2017-07-17 NOTE — Clinical Social Work Note (Signed)
Clinical Social Work Assessment  Patient Details  Name: [name] MRN: [id number] Date of Birth: Oct 21, 1925  Date of referral:  07/17/17               Reason for consult:  Facility Placement                Permission sought to share information with:  Chartered certified accountant granted to share information::  Yes, Verbal Permission Granted  Name::      [place]::   [place]   Relationship::     Contact Information:     Housing/Transportation Living arrangements for the past 2 months:  [place] of Information:  Patient, Spouse Patient Interpreter Needed:  None Criminal Activity/Legal Involvement Pertinent to Current Situation/Hospitalization:  No - Comment as needed Significant Relationships:  Adult Children, Spouse Lives with:  Spouse Do you feel safe going back to the place where you live?  Yes Need for family participation in patient care:  Yes (Comment)  Care giving concerns:  Patient lives in [place] with his wife Jeanett Schlein.   Social Worker assessment / plan:  Holiday representative (CSW) received verbal consult from MD that PT is recommending SNF. CSW met with patient and his wife at bedside to discuss D/C plan. Patient was alert and oriented X4 and was laying in the bed. CSW introduced self and explained role of CSW department. Patient reported that he lives with his wife and prefers to go home. CSW explained the SNF process and the benefits of SNF. CSW also explained that [facility] will have to approve SNF. Patient and his wife are agreeable to SNF search in [place]. FL2 complete and faxed out.   CSW presented bed offers to patient and his wife. They chose [facility]. Patient is medically stable for D/C to [facility] today. [facility] SNF authorization has been received, [facility] #[id number]. CSW made patient aware that he will need to work with PT as best he can despite his pain. Patient  verbalized his understanding. Per [facility] admissions coordinator at [facility] they can accept patient today to room 513. RN will call report and arrange EMS for transport. CSW sent D/C orders to [facility] via [place]. Please reconsult if future social work needs arise. CSW signing off.   Employment status:  Retired Nurse, adult PT Recommendations:  [place] / Referral to community resources:  [place]  Patient/Family's Response to care:  Patient is agreeable to D/C to [facility] today.   Patient/Family's Understanding of and Emotional Response to Diagnosis, Current Treatment, and Prognosis:  Patient and his wife were very pleasant and thanked CSW for assistance.   Emotional Assessment Appearance:  Appears stated age Attitude/Demeanor/Rapport:    Affect (typically observed):  Accepting, Adaptable, Pleasant Orientation:  Oriented to Self, Oriented to Place, Oriented to  Time, Oriented to Situation Alcohol / Substance use:  Not Applicable Psych involvement (Current and /or in the community):  No (Comment)  Discharge Needs  Concerns to be addressed:  Discharge Planning Concerns Readmission within the last 30 days:  No Current discharge risk:  None Barriers to Discharge:  No Barriers Identified   Miguel Christiana, [place] Beets, LCSW 07/17/2017, 5:35 PM

## 2017-07-17 NOTE — Clinical Social Work Placement (Signed)
   CLINICAL SOCIAL WORK PLACEMENT  NOTE  Date:  07/17/2017  Patient Details  Name: [name] MRN: [id number] Date of Birth: 1925-11-26  Clinical Social Work is seeking post-discharge placement for this patient at the [place] level of care (*CSW will initial, date and re-position this form in  chart as items are completed):  Yes   Patient/family provided with [place] Work Department's list of facilities offering this level of care within the geographic area requested by the patient (or if unable, by the patient's family).  Yes   Patient/family informed of their freedom to choose among providers that offer the needed level of care, that participate in Medicare, Medicaid or managed care program needed by the patient, have an available bed and are willing to accept the patient.  Yes   Patient/family informed of [place]'s ownership interest in [facility] and [facility], as well as of the fact that they are under no obligation to receive care at these facilities.  PASRR submitted to EDS on       PASRR number received on       Existing PASRR number confirmed on 07/17/17     FL2 transmitted to all facilities in geographic area requested by pt/family on 07/17/17     FL2 transmitted to all facilities within larger geographic area on       Patient informed that his/her managed care company has contracts with or will negotiate with certain facilities, including the following:        Yes   Patient/family informed of bed offers received.  Patient chooses bed at [facility] )     Physician recommends and patient chooses bed at      Patient to be transferred to [facility] ) on 07/17/17.  Patient to be transferred to facility by [facility] )     Patient family notified on 07/17/17 of transfer.  Name of family member notified:  (Patient's wife Jeanett Schlein is at bedside and aware of D/C today. )     PHYSICIAN         Additional Comment:    _______________________________________________ Sanita Estrada, [place] Beets, LCSW 07/17/2017, 5:33 PM

## 2017-07-17 NOTE — Progress Notes (Signed)
EMS called for transportation.  

## 2017-07-17 NOTE — NC FL2 (Signed)
[place] LEVEL OF CARE SCREENING TOOL     IDENTIFICATION  Patient Name: [name] Birthdate: 19-Feb-1926 Sex: male Admission Date (Current Location): 07/15/2017  [place] and [place] Number:  Engineering geologist and Address:  [facility], [address] [place], [place] [address]      Provider Number: [id number]  Attending Physician Name and Address:  [name], MD  Relative Name and Phone Number:       Current Level of Care: Hospital Recommended Level of Care: [place] Prior Approval Number:    Date Approved/Denied:   PASRR Number: [id number])  Discharge Plan: SNF    Current Diagnoses: Patient Active Problem List   Diagnosis Date Noted  . Fracture closed, pubis ([place]) 07/15/2017  . Bilateral carotid artery stenosis 01/01/2017  . Peripheral artery disease ([place]) 01/01/2017  . Essential hypertension 01/01/2017  . Hyperlipidemia 01/01/2017    Orientation RESPIRATION BLADDER Height & Weight     Self, Time, Situation, Place  O2(2 Liters Oxygen. ) Continent Weight: 193 lb (87.5 kg) Height:  5\' 10"  (177.8 cm)  BEHAVIORAL SYMPTOMS/MOOD NEUROLOGICAL BOWEL NUTRITION STATUS      Continent Diet(Diet: Heart Healthy )  AMBULATORY STATUS COMMUNICATION OF NEEDS Skin   Extensive Assist Verbally Normal                       Personal Care Assistance Level of Assistance  Bathing, Feeding, Dressing Bathing Assistance: Limited assistance Feeding assistance: Independent Dressing Assistance: Limited assistance     Functional Limitations Info  Sight, Hearing, Speech Sight Info: Adequate Hearing Info: Impaired Speech Info: Adequate    SPECIAL CARE FACTORS FREQUENCY  PT (By licensed PT), OT (By licensed OT)     PT Frequency: (5) OT Frequency: (5)            Contractures      Additional Factors Info  Code Status, Allergies Code Status Info: (Full Code. ) Allergies Info: (No Known  Allergies. )           Current Medications (07/17/2017):  This is the current hospital active medication list Current Facility-Administered Medications  Medication Dose Route Frequency Provider Last Rate Last Dose  . acetaminophen (TYLENOL) tablet 650 mg  650 mg Oral Q6H PRN Amelia Jo, MD   650 mg at 07/16/17 1858   Or  . acetaminophen (TYLENOL) suppository 650 mg  650 mg Rectal Q6H PRN Amelia Jo, MD      . acetaminophen (TYLENOL) tablet 500 mg  500 mg Oral QHS PRN Amelia Jo, MD   500 mg at 07/15/17 2333   And  . diphenhydrAMINE (BENADRYL) capsule 25 mg  25 mg Oral QHS PRN Amelia Jo, MD   25 mg at 07/15/17 2334  . amLODipine (NORVASC) tablet 5 mg  5 mg Oral Daily Amelia Jo, MD   5 mg at 07/17/17 0905  . bisacodyl (DULCOLAX) EC tablet 5 mg  5 mg Oral Daily PRN Amelia Jo, MD   5 mg at 07/16/17 1856  . cholecalciferol (VITAMIN D) tablet 2,000 Units  2,000 Units Oral q morning - 10a Amelia Jo, MD   2,000 Units at 07/17/17 [id number]  . docusate sodium (COLACE) capsule 100 mg  100 mg Oral BID Amelia Jo, MD   100 mg at 07/17/17 0905  . HYDROcodone-acetaminophen (NORCO/VICODIN) 5-325 MG per tablet 1-2 tablet  1-2 tablet Oral Q4H PRN Amelia Jo, MD   2 tablet at 07/17/17 0904  .  HYDROmorphone (DILAUDID) injection 1 mg  1 mg Intravenous Q4H PRN [name], MD      . methocarbamol (ROBAXIN) tablet 500 mg  500 mg Oral Q6H PRN [name], MD      . multivitamin with minerals tablet 1 tablet  1 tablet Oral Daily Amelia Jo, MD   1 tablet at 07/17/17 0904  . ondansetron (ZOFRAN) tablet 4 mg  4 mg Oral Q6H PRN Amelia Jo, MD       Or  . ondansetron [facility]) injection 4 mg  4 mg Intravenous Q6H PRN Amelia Jo, MD      . pantoprazole (PROTONIX) EC tablet 40 mg  40 mg Oral Daily Amelia Jo, MD   40 mg at 07/17/17 0905  . pravastatin (PRAVACHOL) tablet 10 mg  10 mg Oral q1800 Amelia Jo, MD   10 mg at 07/16/17 1856  . terazosin (HYTRIN) capsule 2  mg  2 mg Oral QHS Amelia Jo, MD   2 mg at 07/16/17 2045  . traZODone (DESYREL) tablet 25 mg  25 mg Oral QHS PRN Amelia Jo, MD      . vitamin B-12 (CYANOCOBALAMIN) tablet 1,000 mcg  1,000 mcg Oral Daily Amelia Jo, MD   1,000 mcg at 07/17/17 0905  . warfarin (COUMADIN) tablet 2.5 mg  2.5 mg Oral q1800 Amelia Jo, MD   2.5 mg at 07/16/17 1856     Discharge Medications: Please see discharge summary for a list of discharge medications.  Relevant Imaging Results:  Relevant Lab Results:   Additional Information (SSN: [id number])  Skylynn Burkley, Veronia Beets, LCSW

## 2017-07-18 DIAGNOSIS — I700 Atherosclerosis of aorta: Secondary | ICD-10-CM | POA: Diagnosis not present

## 2017-07-18 DIAGNOSIS — M8000XD Age-related osteoporosis with current pathological fracture, unspecified site, subsequent encounter for fracture with routine healing: Secondary | ICD-10-CM | POA: Diagnosis not present

## 2017-07-18 DIAGNOSIS — I517 Cardiomegaly: Secondary | ICD-10-CM | POA: Diagnosis not present

## 2017-07-18 DIAGNOSIS — I482 Chronic atrial fibrillation: Secondary | ICD-10-CM | POA: Diagnosis not present

## 2017-08-01 DIAGNOSIS — M25559 Pain in unspecified hip: Secondary | ICD-10-CM | POA: Diagnosis not present

## 2017-08-19 ENCOUNTER — Encounter: Payer: PPO | Attending: Physician Assistant | Admitting: Physician Assistant

## 2017-08-19 DIAGNOSIS — L89619 Pressure ulcer of right heel, unspecified stage: Secondary | ICD-10-CM | POA: Diagnosis not present

## 2017-08-19 DIAGNOSIS — L89610 Pressure ulcer of right heel, unstageable: Secondary | ICD-10-CM | POA: Insufficient documentation

## 2017-08-19 DIAGNOSIS — L89629 Pressure ulcer of left heel, unspecified stage: Secondary | ICD-10-CM | POA: Diagnosis not present

## 2017-08-19 DIAGNOSIS — I10 Essential (primary) hypertension: Secondary | ICD-10-CM | POA: Diagnosis not present

## 2017-08-19 DIAGNOSIS — L89620 Pressure ulcer of left heel, unstageable: Secondary | ICD-10-CM | POA: Insufficient documentation

## 2017-08-19 DIAGNOSIS — Z87891 Personal history of nicotine dependence: Secondary | ICD-10-CM | POA: Insufficient documentation

## 2017-08-19 DIAGNOSIS — I482 Chronic atrial fibrillation: Secondary | ICD-10-CM | POA: Insufficient documentation

## 2017-08-19 DIAGNOSIS — Z7901 Long term (current) use of anticoagulants: Secondary | ICD-10-CM | POA: Diagnosis not present

## 2017-08-21 NOTE — Progress Notes (Signed)
[name], [name] ([id number]) Visit Report for 08/19/2017 Chief Complaint Document Details Patient Name: [name], [name] Date of Service: 08/19/2017 10:30 AM Medical Record Number: [id number] Patient Account Number: [id number] Date of Birth/Sex: 1926-06-14 (84 y.o. Male) Treating RN: [name] Primary Care Provider: Ramonita [name] Other Clinician: Referring Provider: Ramonita [name] Treating Provider/Extender: [name], [name] in Treatment: 0 Information Obtained from: Patient Chief Complaint Bilateral Heel pressure ulcers and bilateral great toe DTIs Electronic Signature(s) Signed: 08/19/2017 5:25:11 PM By: [name] Keeler PA-C Entered By: [name] on 08/19/2017 11:30:19 Kilner, [name] ([id number]) -------------------------------------------------------------------------------- HPI Details Patient Name: [name], [name] Date of Service: 08/19/2017 10:30 AM Medical Record Number: [id number] Patient Account Number: [id number] Date of Birth/Sex: 25-Oct-1925 (84 y.o. Male) Treating RN: [name] Primary Care Provider: Ramonita [name] Other Clinician: Referring Provider: Ramonita [name] Treating Provider/Extender: [name], [name] in Treatment: 0 History of Present Illness Associated Signs and Symptoms: Patient has a history of chronic atrophic relation, hypertension, and long-term use of anticoagulants. He also had a fall with pelvic fracture which occurred on January 2019. HPI Description: 08/19/17 patient presents today for initial evaluation and our clinic concerning issues he has been having with the bilateral heels as well as the lateral great toe was since he was admitted to the nursing facility following a pelvic fracture. Initially he was spending a significant amount of time in the bed although he is now up more often since the healing process has progressed along the way obviously. When he is up he's spending most of the time sitting in a wheelchair at this point.  When he is in bed he lays on his back and the covers/sheets are pushing down on his toes bilaterally which I think it may be what's causing the issues with his deep tissue injury to the bilateral great toes. With that being said the hills also appear to be pressure in nature and I think that laying in the bed getting pressure to the sites is what has led to the formation of these on stage will pressure ulcers. Fortunately there does not appear to be any infection in both areas of ulceration of the heel appear to be stable at this point. Patient does not have any significant pain at the site she does have some of the eschar along the edges which is starting to lift up and come all hopefully slowly but surely this will occur. Patient did have arterial studies performed April 2018 which showed a normal TBI on the right knee slightly depressed TBI on the left but this does not appear to have changed significantly at that point. Specifically this was 0.75 on the right and 0.61 on the left. Electronic Signature(s) Signed: 08/19/2017 5:25:11 PM By: [name] Keeler PA-C Entered By: [name] on 08/19/2017 11:36:33 [name], [name] ([id number]) -------------------------------------------------------------------------------- Physical Exam Details Patient Name: [name], [name] Date of Service: 08/19/2017 10:30 AM Medical Record Number: [id number] Patient Account Number: [id number] Date of Birth/Sex: 1925-09-12 (84 y.o. Male) Treating RN: [name] Primary Care Provider: Ramonita [name] Other Clinician: Referring Provider: Ramonita [name] Treating Provider/Extender: [name] [name] in Treatment: 0 Constitutional patient is hypotensive.. pulse regular and within target range for patient.Marland Kitchen respirations regular, non-labored and within target range for patient.Marland Kitchen temperature within target range for patient.. Well-nourished and well-hydrated in no acute distress. Eyes conjunctiva clear no eyelid  edema noted. pupils equal round and reactive to light and accommodation. Ears, Nose, Mouth, and Throat no gross abnormality  of ear auricles or external auditory canals. normal hearing noted during conversation. mucus membranes moist. Respiratory normal breathing without difficulty. clear to auscultation bilaterally. Cardiovascular regular rate and rhythm with normal S1, S2. 1+ dorsalis pedis/posterior tibialis pulses. no clubbing, cyanosis, significant edema, <3 sec cap refill. Gastrointestinal (GI) soft, non-tender, non-distended, +BS. no ventral hernia noted. Musculoskeletal Patient unable to walk without assistance. Psychiatric this patient is able to make decisions and demonstrates good insight into disease process. Alert and Oriented x 3. pleasant and cooperative. Notes Patient's wound beds of the bilateral heels are completely eschar covered with no evidence of infection at this point. This is stable. He does have slight deep tissue injuries to the bilateral great toes noted at this point. I believe this is likely due to the sheets pushing on this area. Electronic Signature(s) Signed: 08/19/2017 5:25:11 PM By: [name] Keeler PA-C Entered By: [name] on 08/19/2017 11:41:04 [name], [name] ([id number]) -------------------------------------------------------------------------------- Physician Orders Details Patient Name: [name], [name] Date of Service: 08/19/2017 10:30 AM Medical Record Number: [id number] Patient Account Number: [id number] Date of Birth/Sex: October 12, 1925 (84 y.o. Male) Treating RN: [name] Primary Care Provider: Ramonita [name] Other Clinician: Referring Provider: Ramonita [name] Treating Provider/Extender: [name], [name] in Treatment: 0 Verbal / Phone Orders: Yes ClinicianCarolyne [name], [name] Read Back and Verified: Yes Diagnosis Coding ICD-10 Coding Code Description L89.620 Pressure ulcer of left heel, unstageable L89.610 Pressure ulcer of  right heel, unstageable I48.2 Chronic atrial fibrillation I10 Essential (primary) hypertension Z79.01 Long term (current) use of anticoagulants Wound Cleansing Wound #1 Left Calcaneus o Clean wound with Normal Saline. o Cleanse wound with mild soap and water o May Shower, gently pat wound dry prior to applying new dressing. Wound #2 Right Calcaneus o Clean wound with Normal Saline. o Cleanse wound with mild soap and water o May Shower, gently pat wound dry prior to applying new dressing. Anesthetic (add to Medication List) Wound #1 Left Calcaneus o Topical Lidocaine 4% cream applied to wound bed prior to debridement (In Clinic Only). Wound #2 Right Calcaneus o Topical Lidocaine 4% cream applied to wound bed prior to debridement (In Clinic Only). Primary Wound Dressing o Other: - also please use provodone-iodine on the left great toe tip pressure areas and the right great toe tip areas Wound #1 Left Calcaneus o Other: - provodone-iodine Wound #2 Right Calcaneus o Other: - provodone-iodine Secondary Dressing Wound #1 Left Calcaneus o Other - no bandages Wound #2 Right Calcaneus o Other - no bandages Dressing Change Frequency Cooprider, Kamaron L. ([id number]) Wound #1 Left Calcaneus o Change dressing twice daily. - morning and evening Wound #2 Right Calcaneus o Change dressing twice daily. - morning and evening Follow-up Appointments Wound #1 Left Calcaneus o Return Appointment in 1 week. Wound #2 Right Calcaneus o Return Appointment in 1 week. Off-Loading Wound #1 Left Calcaneus o Heel suspension boot to: - Please order prevalon boots for bilateral feet Please float heel when pt is in the bed (NO PRESSURE ON HEELS) o Turn and reposition every 2 hours o Other: - Adjustable Blanket Lift Bar so the sheets and blanket does not put pressure on the toes Wound #2 Right Calcaneus o Heel suspension boot to: - Please order prevalon boots for  bilateral feet Please float heel when pt is in the bed (NO PRESSURE ON HEELS) o Turn and reposition every 2 hours o Other: - Adjustable Blanket Lift Bar so the sheets and blanket does not put pressure on the toes  Additional Orders / Instructions Wound #1 Left Calcaneus o Increase protein intake. Wound #2 Right Calcaneus o Increase protein intake. Patient Medications Allergies: NKDA Notifications Medication Indication Start End lidocaine DOSE 1 - topical 4 % cream - 1 cream topical Electronic Signature(s) Signed: 08/19/2017 5:25:11 PM By: [name] Keeler PA-C Signed: 08/20/2017 4:46:02 PM By: Alric Quan Entered By: Alric Quan on 08/19/2017 11:11:01 [name], [name] ([id number]) -------------------------------------------------------------------------------- Prescription 08/19/2017 Patient Name: NOEH, SPARACINO. Provider: Worthy Keeler PA-C Date of Birth: 1925/10/04 NPI#: [id number] Sex: Jerilynn Mages DEA#: [id number] Phone #: [phone] License #: Patient Address: [facility], [place] [address] [address], [place], [place] [address] [phone] Allergies NKDA Medication Medication: Route: Strength: Form: lidocaine topical 4% cream Class: TOPICAL LOCAL ANESTHETICS Dose: Frequency / Time: Indication: 1 1 cream topical Number of Refills: Number of Units: 0 Generic Substitution: Start Date: End Date: Administered at [place]: Yes Time Administered: Time Discontinued: Note to Pharmacy: Signature(s): Date(s): Electronic Signature(s) Signed: 08/19/2017 5:25:11 PM By: [name] Keeler PA-C Signed: 08/20/2017 4:46:02 PM By: Alric Quan Entered By: Alric Quan on 08/19/2017 11:11:02 Birkland, Bronson ([id number]) [place]onsa, [place]  ([id number]) --------------------------------------------------------------------------------  Problem List Details Patient Name: DEVENEY, Jayson L. Date of Service: 08/19/2017 10:30 AM Medical Record Number: [id number] Patient Account Number: [id number] Date of Birth/Sex: August 22, 1925 (84 y.o. Male) Treating RN: [name] Primary Care Provider: Ramonita [name] Other Clinician: Referring Provider: Ramonita [name] Treating Provider/Extender: [name], [name] in Treatment: 0 Active Problems ICD-10 Encounter Code Description Active Date Diagnosis L89.620 Pressure ulcer of left heel, unstageable 08/19/2017 Yes L89.610 Pressure ulcer of right heel, unstageable 08/19/2017 Yes I48.2 Chronic atrial fibrillation 08/19/2017 Yes I10 Essential (primary) hypertension 08/19/2017 Yes Z79.01 Long term (current) use of anticoagulants 08/19/2017 Yes Inactive Problems Resolved Problems Electronic Signature(s) Signed: 08/19/2017 5:25:11 PM By: [name] Keeler PA-C Entered By: [name] on 08/19/2017 10:23:56 Krikorian, Tia L. ([id number]) -------------------------------------------------------------------------------- Progress Note Details Patient Name: [name], [name] Date of Service: 08/19/2017 10:30 AM Medical Record Number: [id number] Patient Account Number: [id number] Date of Birth/Sex: [birth year] (84 y.o. Male) Treating RN: [name] Primary Care Provider: Ramonita [name] Other Clinician: Referring Provider: Ramonita [name] Treating Provider/Extender: [name], [name] in Treatment: 0 Subjective Chief Complaint Information obtained from Patient Bilateral Heel pressure ulcers and bilateral great toe DTIs History of Present Illness (HPI) The following HPI elements were documented for the patient's wound: Associated Signs and Symptoms: Patient has a history of chronic atrophic relation, hypertension, and long-term use of anticoagulants. He also had a fall with pelvic fracture which occurred  on January 2019. 08/19/17 patient presents today for initial evaluation and our clinic concerning issues he has been having with the bilateral heels as well as the lateral great toe was since he was admitted to the nursing facility following a pelvic fracture. Initially he was spending a significant amount of time in the bed although he is now up more often since the healing process has progressed along the way obviously. When he is up he's spending most of the time sitting in a wheelchair at this point. When he is in bed he lays on his back and the covers/sheets are pushing down on his toes bilaterally which I think it may be what's causing the issues with his deep tissue injury to the bilateral great toes. With that being said the hills also appear to be pressure in nature and I  think that laying in the bed getting pressure to the sites is what has led to the formation of these on stage will pressure ulcers. Fortunately there does not appear to be any infection in both areas of ulceration of the heel appear to be stable at this point. Patient does not have any significant pain at the site she does have some of the eschar along the edges which is starting to lift up and come all hopefully slowly but surely this will occur. Patient did have arterial studies performed April 2018 which showed a normal TBI on the right knee slightly depressed TBI on the left but this does not appear to have changed significantly at that point. Specifically this was 0.75 on the right and 0.61 on the left. Wound History Patient presents with 2 open wounds that have been present for approximately 1 week. Patient has been treating wounds in the following manner: nothing. Laboratory tests have not been performed in the last month. Patient reportedly has tested positive for an antibiotic resistant organism. Patient reportedly has not tested positive for osteomyelitis. Patient reportedly has not had testing performed to  evaluate circulation in the legs. Patient experiences the following problems associated with their wounds: swelling. Patient History Information obtained from Patient. Allergies NKDA Family History No family history of Cancer, Diabetes, Heart Disease, Hereditary Spherocytosis, Hypertension, Kidney Disease, Lung Disease, Seizures, Stroke, Thyroid Problems, Tuberculosis. Social History Former smoker, Marital Status - Married, Alcohol Use - Rarely - 2oz day, Drug Use - No History, Caffeine Use - Daily. [name], [name] ([id number]) Medical History Cardiovascular Patient has history of Arrhythmia - a-fib, Hypertension Oncologic Patient has history of Received Radiation Review of Systems (ROS) Constitutional Symptoms ([place]) The patient has no complaints or symptoms. Eyes The patient has no complaints or symptoms. Ear/Nose/Mouth/Throat The patient has no complaints or symptoms. Hematologic/Lymphatic The patient has no complaints or symptoms. Respiratory The patient has no complaints or symptoms. Gastrointestinal gerd Endocrine The patient has no complaints or symptoms. Genitourinary The patient has no complaints or symptoms. Immunological The patient has no complaints or symptoms. Integumentary (Skin) Complains or has symptoms of Wounds. Musculoskeletal The patient has no complaints or symptoms. Neurologic The patient has no complaints or symptoms. Oncologic prostate Psychiatric The patient has no complaints or symptoms. Objective Constitutional patient is hypotensive.. pulse regular and within target range for patient.Marland Kitchen respirations regular, non-labored and within target range for patient.Marland Kitchen temperature within target range for patient.. Well-nourished and well-hydrated in no acute distress. Vitals Time Taken: 10:09 AM, Height: 70 in, Source: Stated, Weight: 190 lbs, Source: Stated, BMI: 27.3, Pulse: 71 bpm, Respiratory Rate: 16 breaths/min, Blood Pressure: 91/51  mmHg. Eyes conjunctiva clear no eyelid edema noted. pupils equal round and reactive to light and accommodation. Ears, Nose, Mouth, and Throat no gross abnormality of ear auricles or external auditory canals. normal hearing noted during conversation. mucus Canedo, Davidson ([id number]) membranes moist. Respiratory normal breathing without difficulty. clear to auscultation bilaterally. Cardiovascular regular rate and rhythm with normal S1, S2. 1+ dorsalis pedis/posterior tibialis pulses. no clubbing, cyanosis, significant edema, Gastrointestinal (GI) soft, non-tender, non-distended, +BS. no ventral hernia noted. Musculoskeletal Patient unable to walk without assistance. Psychiatric this patient is able to make decisions and demonstrates good insight into disease process. Alert and Oriented x 3. pleasant and cooperative. General Notes: Patient's wound beds of the bilateral heels are completely eschar covered with no evidence of infection at this point. This is stable. He does have slight deep tissue injuries to  the bilateral great toes noted at this point. I believe this is likely due to the sheets pushing on this area. Integumentary (Hair, Skin) Wound #1 status is Open. Original cause of wound was Pressure Injury. The wound is located on the Left Calcaneus. The wound measures 6.2cm length x 4.7cm width x 0.2cm depth; 22.887cm^2 area and 4.577cm^3 volume. There is no tunneling or undermining noted. There is a none present amount of drainage noted. The wound margin is thickened. There is no granulation within the wound bed. There is a large (67-100%) amount of necrotic tissue within the wound bed including Eschar. Periwound temperature was noted as No Abnormality. The periwound has tenderness on palpation. Wound #2 status is Open. Original cause of wound was Pressure Injury. The wound is located on the Right Calcaneus. The wound measures 3.4cm length x 4.3cm width x 0.2cm depth; 11.483cm^2  area and 2.297cm^3 volume. There is no tunneling or undermining noted. There is a none present amount of drainage noted. The wound margin is thickened. There is no granulation within the wound bed. There is a large (67-100%) amount of necrotic tissue within the wound bed including Eschar. Periwound temperature was noted as No Abnormality. The periwound has tenderness on palpation. Assessment Active Problems ICD-10 L89.620 - Pressure ulcer of left heel, unstageable L89.610 - Pressure ulcer of right heel, unstageable I48.2 - Chronic atrial fibrillation I10 - Essential (primary) hypertension Z79.01 - Long term (current) use of anticoagulants Plan Wound Cleansing: Toral, Varnado ([id number]) Wound #1 Left Calcaneus: Clean wound with Normal Saline. Cleanse wound with mild soap and water May Shower, gently pat wound dry prior to applying new dressing. Wound #2 Right Calcaneus: Clean wound with Normal Saline. Cleanse wound with mild soap and water May Shower, gently pat wound dry prior to applying new dressing. Anesthetic (add to Medication List): Wound #1 Left Calcaneus: Topical Lidocaine 4% cream applied to wound bed prior to debridement (In Clinic Only). Wound #2 Right Calcaneus: Topical Lidocaine 4% cream applied to wound bed prior to debridement (In Clinic Only). Primary Wound Dressing: Other: - also please use provodone-iodine on the left great toe tip pressure areas and the right great toe tip areas Wound #1 Left Calcaneus: Other: - provodone-iodine Wound #2 Right Calcaneus: Other: - provodone-iodine Secondary Dressing: Wound #1 Left Calcaneus: Other - no bandages Wound #2 Right Calcaneus: Other - no bandages Dressing Change Frequency: Wound #1 Left Calcaneus: Change dressing twice daily. - morning and evening Wound #2 Right Calcaneus: Change dressing twice daily. - morning and evening Follow-up Appointments: Wound #1 Left Calcaneus: Return Appointment in 1  week. Wound #2 Right Calcaneus: Return Appointment in 1 week. Off-Loading: Wound #1 Left Calcaneus: Heel suspension boot to: - Please order prevalon boots for bilateral feet Please float heel when pt is in the bed (NO PRESSURE ON HEELS) Turn and reposition every 2 hours Other: - Adjustable Blanket Lift Bar so the sheets and blanket does not put pressure on the toes Wound #2 Right Calcaneus: Heel suspension boot to: - Please order prevalon boots for bilateral feet Please float heel when pt is in the bed (NO PRESSURE ON HEELS) Turn and reposition every 2 hours Other: - Adjustable Blanket Lift Bar so the sheets and blanket does not put pressure on the toes Additional Orders / Instructions: Wound #1 Left Calcaneus: Increase protein intake. Wound #2 Right Calcaneus: Increase protein intake. The following medication(s) was prescribed: lidocaine topical 4 % cream 1 1 cream topical was prescribed at facility At this  point I'm going to recommend the following. [name], [name] ([id number]) 1. Problem on offloading boots for the bilateral lower extremities to protect patient when he is in the bed. 2. That patient has a sheet offloading bar that goes over his toes in order to avoid any pressure to the tops of his great toes bilaterally where I feel there is a deep tissue injury secondary to the pressure. 3. The patient have a physical therapy evaluation in order to obtain offloading shoes for the posterior heels so that he is able to ambulate without causing additional injury. 4. I'm going to suggest that we perform Betadine paints twice a day on the bilateral great toes as was the bilateral heels along with leaving this open to air so that hopefully this will dry out and eventually start to peel/flake off. Please see above for specific wound care orders. We will see patient for re-evaluation in 1 week(s) here in the clinic. If anything worsens or changes patient will contact our office for  additional recommendations. Electronic Signature(s) Signed: 08/19/2017 5:25:11 PM By: [name] Keeler PA-C Entered By: [name] on 08/19/2017 11:41:22 [name], [name] ([id number]) -------------------------------------------------------------------------------- ROS/PFSH Details Patient Name: [name], [name] Date of Service: 08/19/2017 10:30 AM Medical Record Number: [id number] Patient Account Number: [id number] Date of Birth/Sex: 09/03/1925 (84 y.o. Male) Treating RN: [name] Primary Care Provider: Ramonita [name] Other Clinician: Referring Provider: Ramonita [name] Treating Provider/Extender: [name], [name] in Treatment: 0 Information Obtained From Patient Wound History Do you currently have one or more open woundso Yes How many open wounds do you currently haveo 2 Approximately how long have you had your woundso 1 week How have you been treating your wound(s) until nowo nothing Has your wound(s) ever healed and then re-openedo No Have you had any [name] work done in the past montho No Have you tested positive for osteomyelitis (bone infection)o No Have you had any tests for circulation on your legso No Have you had other problems associated with your woundso Swelling Integumentary (Skin) Complaints and Symptoms: Positive for: Wounds Constitutional Symptoms (General Health) Complaints and Symptoms: No Complaints or Symptoms Eyes Complaints and Symptoms: No Complaints or Symptoms Ear/Nose/Mouth/Throat Complaints and Symptoms: No Complaints or Symptoms Hematologic/Lymphatic Complaints and Symptoms: No Complaints or Symptoms Respiratory Complaints and Symptoms: No Complaints or Symptoms Cardiovascular Medical History: Positive for: Arrhythmia - a-fib; Hypertension Gastrointestinal Valvano, [place] ([id number]) Complaints and Symptoms: Review of System Notes: gerd Endocrine Complaints and Symptoms: No Complaints or Symptoms Genitourinary Complaints and  Symptoms: No Complaints or Symptoms Immunological Complaints and Symptoms: No Complaints or Symptoms Musculoskeletal Complaints and Symptoms: No Complaints or Symptoms Neurologic Complaints and Symptoms: No Complaints or Symptoms Oncologic Complaints and Symptoms: Review of System Notes: prostate Medical History: Positive for: Received Radiation Psychiatric Complaints and Symptoms: No Complaints or Symptoms Immunizations Pneumococcal Vaccine: Received Pneumococcal Vaccination: Yes Implantable Devices Family and Social History Cancer: No; Diabetes: No; Heart Disease: No; Hereditary Spherocytosis: No; Hypertension: No; Kidney Disease: No; Lung Disease: No; Seizures: No; Stroke: No; Thyroid Problems: No; Tuberculosis: No; Former smoker; Marital Status - Married; Alcohol Use: Rarely - 2oz day; Drug Use: No History; Caffeine Use: Daily; Financial Concerns: No; Food, Clothing or Shelter Needs: No; Support System Lacking: No; Transportation Concerns: No; Advanced Directives: Yes (Not Provided); Patient does not want information on Advanced Directives; Do not resuscitate: No; Living Will: Yes (Not Provided); Medical Power of Attorney: No Electronic Signature(s) [name], [name] ([id number]) Signed: 08/19/2017 5:25:11 PM By: Joaquim Lai  Dixie Dials PA-C Signed: 08/20/2017 4:46:02 PM By: Alric Quan Entered By: Alric Quan on 08/19/2017 10:18:33 Nola, Henri L. ([id number]) -------------------------------------------------------------------------------- SuperBill Details Patient Name: HANKEN, Agustus L. Date of Service: 08/19/2017 Medical Record Number: [id number] Patient Account Number: [id number] Date of Birth/Sex: 08/23/1925 (84 y.o. 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, unstageable L89.610 Pressure ulcer of right heel, unstageable I48.2 Chronic atrial fibrillation I10 Essential (primary) hypertension Z79.01 Long term (current) use of anticoagulants Facility Procedures CPT4 Code: 34356861 Description: 68372 - WOUND CARE VISIT-LEV 5 EST PT Modifier: Quantity: 1 Physician Procedures CPT4 Code: [id number] Description: 52080 - WC PHYS LEVEL 4 - NEW PT ICD-10 Diagnosis Description L89.620 Pressure ulcer of left heel, unstageable L89.610 Pressure ulcer of right heel, unstageable I48.2 Chronic atrial fibrillation I10 Essential (primary) hypertension Modifier: Quantity: 1 Electronic Signature(s) Signed: 08/19/2017 11:47:42 AM By: Alric Quan Signed: 08/19/2017 5:25:11 PM By: [name] Keeler PA-C Entered By: Alric Quan on 08/19/2017 11:47:42

## 2017-08-21 NOTE — Progress Notes (Signed)
Wardlow, Jonel L. ([id number]) Visit Report for 08/19/2017 Abuse/Suicide Risk Screen Details Patient Name: NILTON, LAVE. Date of Service: 08/19/2017 10:30 AM Medical Record Number: [id number] Patient Account Number: [id number] Date of Birth/Sex: [birth year] (84 y.o. Male) Treating RN: Ahmed Prima Primary Care Larkyn Greenberger: Ramonita Lab Other Clinician: Referring Jeana Kersting: Ramonita Lab Treating Erhardt Dada/Extender: Melburn Hake, HOYT Weeks in Treatment: 0 Abuse/Suicide Risk Screen Items Answer ABUSE/SUICIDE RISK SCREEN: Has anyone close to you tried to hurt or harm you recentlyo No Do you feel uncomfortable with anyone in your familyo No Has anyone forced you do things that you didnot want to doo No Do you have any thoughts of harming yourselfo No Patient displays signs or symptoms of abuse and/or neglect. No Electronic Signature(s) Signed: 08/20/2017 4:46:02 PM By: Alric Quan Entered By: Alric Quan on 08/19/2017 10:18:37 Caputo, Maclean L. ([id number]) -------------------------------------------------------------------------------- Activities of Daily Living Details Patient Name: MAHER, Tonya L. Date of Service: 08/19/2017 10:30 AM Medical Record Number: [id number] Patient Account Number: [id number] Date of Birth/Sex: 1925/10/16 (84 y.o. Male) Treating RN: Ahmed Prima Primary Care Sparrow Sanzo: Ramonita Lab Other Clinician: Referring Ikey Omary: Ramonita Lab Treating Makayla Lanter/Extender: Melburn Hake, HOYT Weeks in Treatment: 0 Activities of Daily Living Items Answer Activities of Daily Living (Please select one for each item) Drive Automobile Not Able Take Medications Need Assistance Use Telephone Completely Able Care for Appearance Need Assistance Use Toilet Need Assistance Bath / Shower Need Assistance Dress Self Need Assistance Feed Self Completely Able Walk Need Assistance Get In / Out Bed Need Assistance Housework Not Able Prepare Meals Not Able Handle Money Need  Assistance Shop for Self Need Assistance Electronic Signature(s) Signed: 08/20/2017 4:46:02 PM By: Alric Quan Entered By: Alric Quan on 08/19/2017 10:18:41 Milbourne, Agostino L. ([id number]) -------------------------------------------------------------------------------- Education Assessment Details Patient Name: WEIDLER, Young L. Date of Service: 08/19/2017 10:30 AM Medical Record Number: [id number] Patient Account Number: [id number] Date of Birth/Sex: 1925-11-06 (84 y.o. Male) Treating RN: Ahmed Prima Primary Care Aljean Horiuchi: Ramonita Lab Other Clinician: Referring Solenne Manwarren: Ramonita Lab Treating Lisha Vitale/Extender: Melburn Hake, HOYT Weeks in Treatment: 0 Primary Learner Assessed: Patient Learning Preferences/Education Level/Primary Language Learning Preference: Explanation, Printed Material Highest Education Level: High School Preferred Language: English Cognitive Barrier Assessment/Beliefs Language Barrier: No Translator Needed: No Memory Deficit: No Emotional Barrier: No Cultural/Religious Beliefs Affecting Medical Care: No Physical Barrier Assessment Impaired Vision: No Impaired Hearing: No Decreased Hand dexterity: No Knowledge/Comprehension Assessment Knowledge Level: Medium Comprehension Level: Medium Ability to understand written Medium instructions: Ability to understand verbal Medium instructions: Motivation Assessment Anxiety Level: Calm Cooperation: Cooperative Education Importance: Acknowledges Need Interest in Health Problems: Asks Questions Perception: Coherent Willingness to Engage in Self- Medium Management Activities: Readiness to Engage in Self- Medium Management Activities: Electronic Signature(s) Signed: 08/20/2017 4:46:02 PM By: Alric Quan Entered By: Alric Quan on 08/19/2017 10:18:44 Matuska, Broox L. ([id number]) -------------------------------------------------------------------------------- Fall Risk Assessment  Details Patient Name: Lader, Halvor L. Date of Service: 08/19/2017 10:30 AM Medical Record Number: [id number] Patient Account Number: [id number] Date of Birth/Sex: [birth year] (84 y.o. Male) Treating RN: Carolyne Fiscal, Debi Primary Care Swathi Dauphin: Ramonita Lab Other Clinician: Referring Rayli Wiederhold: Ramonita Lab Treating Musab Wingard/Extender: Melburn Hake, HOYT Weeks in Treatment: 0 Fall Risk Assessment Items Have you had 2 or more falls in the last 12 monthso 0 No Have you had any fall that resulted in injury in the last 12 monthso 0 Yes FALL RISK ASSESSMENT: History of falling - immediate or within 3 months 25 Yes Secondary diagnosis 15 Yes Ambulatory aid None/bed rest/wheelchair/nurse 0 Yes Crutches/cane/walker 15 Yes Furniture  0 No IV Access/Saline Lock 0 No Gait/Training Normal/bed rest/immobile 0 No Weak 0 No Impaired 20 Yes Mental Status Oriented to own ability 0 Yes Electronic Signature(s) Signed: 08/20/2017 4:46:02 PM By: Alric Quan Entered By: Alric Quan on 08/19/2017 10:19:34 Smestad, Josel L. ([id number]) -------------------------------------------------------------------------------- Foot Assessment Details Patient Name: RAFFO, Dwayne L. Date of Service: 08/19/2017 10:30 AM Medical Record Number: [id number] Patient Account Number: [id number] Date of Birth/Sex: June 03, 1926 (84 y.o. Male) Treating RN: Ahmed Prima Primary Care Tilda Samudio: Ramonita Lab Other Clinician: Referring Islam Villescas: Ramonita Lab Treating Decorey Wahlert/Extender: Melburn Hake, HOYT Weeks in Treatment: 0 Foot Assessment Items Site Locations + = Sensation present, - = Sensation absent, C = Callus, U = Ulcer R = Redness, W = Warmth, M = Maceration, PU = Pre-ulcerative lesion F = Fissure, S = Swelling, D = Dryness Assessment Right: Left: Other Deformity: No No Prior Foot Ulcer: No No Prior Amputation: No No Charcot Joint: No No Ambulatory Status: Ambulatory With Help Assistance Device: Walker Gait:  Administrator, arts) Signed: 08/20/2017 4:46:02 PM By: Alric Quan Entered By: Alric Quan on 08/19/2017 10:24:51 Molyneux, Arrington L. ([id number]) -------------------------------------------------------------------------------- Nutrition Risk Assessment Details Patient Name: Mclaurin, Lamarcus L. Date of Service: 08/19/2017 10:30 AM Medical Record Number: [id number] Patient Account Number: [id number] Date of Birth/Sex: December 12, 1925 (84 y.o. Male) Treating RN: Ahmed Prima Primary Care Azari Hasler: Ramonita Lab Other Clinician: Referring Jmya Uliano: Ramonita Lab Treating Westyn Keatley/Extender: Melburn Hake, HOYT Weeks in Treatment: 0 Height (in): 70 Weight (lbs): 190 Body Mass Index (BMI): 27.3 Nutrition Risk Assessment Items NUTRITION RISK SCREEN: I have an illness or condition that made me change the kind and/or amount of 2 Yes food I eat I eat fewer than two meals per day 0 No I eat few fruits and vegetables, or milk products 0 No I have three or more drinks of beer, liquor or wine almost every day 0 No I have tooth or mouth problems that make it hard for me to eat 0 No I don't always have enough money to buy the food I need 0 No I eat alone most of the time 0 No I take three or more different prescribed or over-the-counter drugs a day 1 Yes Without wanting to, I have lost or gained 10 pounds in the last six months 0 No I am not always physically able to shop, cook and/or feed myself 0 No Nutrition Protocols Good Risk Protocol Moderate Risk Protocol Electronic Signature(s) Signed: 08/20/2017 4:46:02 PM By: Alric Quan Entered By: Alric Quan on 08/19/2017 10:19:43

## 2017-08-21 NOTE — Progress Notes (Addendum)
Petron, Kollen L. ([id number]) Visit Report for 08/19/2017 Allergy List Details Patient Name: [name], [name] Date of Service: 08/19/2017 10:30 AM Medical Record Number: [id number] Patient Account Number: [id number] Date of Birth/Sex: [birth year] (84 y.o. M) Treating RN: [name] Primary Care Fredonia Casalino: [name] Other Clinician: Referring [name]: [name] Treating [name]/Extender: [name] [name] Allergies Active Allergies No Known Allergies Allergy Notes Electronic Signature(s) Signed: 01/06/2018 1:35:08 PM By: [name] Previous Signature: 08/20/2017 4:46:02 PM Version By: [name] Entered By: [name] on 01/06/2018 13:35:08 Belland, Hassel L. ([id number]) -------------------------------------------------------------------------------- Arrival Information Details Patient Name: [name], [name] Date of Service: 08/19/2017 10:30 AM Medical Record Number: [id number] Patient Account Number: [id number] Date of Birth/Sex: 1926-01-13 (84 y.o. M) Treating RN: [name] Primary Care [name]: [name] Other Clinician: Referring [name] Treating Jewelene Mairena/Extender: [name] [name] Visit Information Patient Arrived: Wheel Chair Arrival Time: 10:05 Accompanied By: wife Transfer Assistance: EasyPivot Patient Lift Patient Identification Verified: Yes Secondary Verification Process Yes Completed: Patient Requires Transmission-Based No Precautions: Patient Has Alerts: Yes Patient Alerts: Patient on Blood Thinner Warfarin Electronic Signature(s) Signed: 08/20/2017 4:46:02 PM By: [name] Entered By: [name] on 08/19/2017 10:08:59 Caperton, Amandeep L. ([id number]) -------------------------------------------------------------------------------- Clinic Level of Care Assessment Details Patient Name: [name], SKIBICKI. Date of Service: 08/19/2017 10:30 AM Medical Record Number:  [id number] Patient Account Number: [id number] Date of Birth/Sex: 12/21/1925 (84 y.o. M) Treating RN: [name] Primary Care Johncharles Fusselman: [name] Other Clinician: Referring Lucero Ide: [name] Treating Renad Jenniges/Extender: [name] [name] Clinic Level of Care Assessment Items TOOL 2 Quantity Score X - Use when only an EandM is performed on the INITIAL visit 1 [name] ASSESSMENTS - Nursing Assessment / Reassessment X - General Physical Exam (combine w/ comprehensive assessment (listed just below) when 1 20 performed on new pt. evals) X- 1 25 Comprehensive Assessment (HX, ROS, Risk Assessments, Wounds Hx, etc.) ASSESSMENTS - Wound and Skin Assessment / Reassessment []  - Simple Wound Assessment / Reassessment - one wound [name] X- 2 5 Complex Wound Assessment / Reassessment - multiple wounds []  - [name] Dermatologic / Skin Assessment (not related to wound area) ASSESSMENTS - Ostomy and/or Continence Assessment and Care []  - Incontinence Assessment and Management [name] []  - [name] Ostomy Care Assessment and Management (repouching, etc.) PROCESS - Coordination of Care []  - Simple Patient / Family Education for ongoing care [name] X- 1 20 Complex (extensive) Patient / Family Education for ongoing care X- 1 10 Staff obtains Programmer, systems, Records, Test Results / Process Orders X- 1 10 Staff telephones HHA, Nursing Homes / Clarify orders / etc []  - [name] Routine Transfer to another Facility (non-emergent condition) []  - [name] Routine Hospital Admission (non-emergent condition) X- 1 15 New Admissions / Biomedical engineer / Ordering NPWT, Apligraf, etc. []  - [name] Emergency Hospital Admission (emergent condition) X- 1 10 Simple Discharge Coordination []  - [name] Complex (extensive) Discharge Coordination PROCESS - Special Needs []  - Pediatric / Minor Patient Management [name] []  - [name] Isolation Patient Management Busta, [name] ([id number]) []  - [name] Hearing / Language / Visual special needs []  -  [name] Assessment of Community assistance (transportation, D/C planning, etc.) []  - [name] Additional assistance / Altered mentation []  - [name] Support Surface(s) Assessment (bed, cushion, seat, etc.) INTERVENTIONS - Wound Cleansing / Measurement X - Wound Imaging (photographs - any number of wounds) 1 5 []  - [name] Wound Tracing (instead of photographs) []  - [name] Simple Wound  Measurement - one wound X- 2 5 Complex Wound Measurement - multiple wounds []  - [name] Simple Wound Cleansing - one wound X- 2 5 Complex Wound Cleansing - multiple wounds INTERVENTIONS - Wound Dressings []  - Small Wound Dressing one or multiple wounds [name] []  - [name] Medium Wound Dressing one or multiple wounds []  - [name] Large Wound Dressing one or multiple wounds []  - [name] Application of Medications - injection INTERVENTIONS - Miscellaneous []  - External ear exam [name] []  - [name] Specimen Collection (cultures, biopsies, blood, body fluids, etc.) []  - [name] Specimen(s) / Culture(s) sent or taken to [name] for analysis []  - [name] Patient Transfer (multiple staff / Civil Service fast streamer / Similar devices) []  - [name] Simple Staple / Suture removal (25 or less) []  - [name] Complex Staple / Suture removal (26 or more) []  - [name] Hypo / Hyperglycemic Management (close monitor of Blood Glucose) X- 1 15 Ankle / Brachial Index (ABI) - do not check if billed separately Has the patient been seen at the hospital within the last three years: Yes Total Score: 160 Level Of Care: New/Established - Level 5 Electronic Signature(s) Signed: 08/20/2017 4:46:02 PM By: [name] Entered By: [name] on 08/19/2017 11:47:33 Blough, Nesbitt ([id number]) -------------------------------------------------------------------------------- Encounter Discharge Information Details Patient Name: [name], HAM. Date of Service: 08/19/2017 10:30 AM Medical Record Number: [id number] Patient Account Number: [id number] Date of Birth/Sex: July 16, 1925 (84 y.o. M) Treating RN: [name] Primary  Care Faryn Sieg: [name] Other Clinician: Referring Giovana Faciane: [name] Treating Annemarie Sebree/Extender: [name] [name] Encounter Discharge Information Items Discharge Pain Level: [name] Discharge Condition: Stable Ambulatory Status: Wheelchair Nursing Discharge Destination: Home Transportation: Private Auto Accompanied By: wife Schedule Follow-up Appointment: Yes Medication Reconciliation completed and No provided to Patient/Care Omunique Pederson: Clinical Summary of Care: Electronic Signature(s) Signed: 08/20/2017 4:46:02 PM By: [name] Entered By: [name] on 08/19/2017 11:24:36 Camper, [place] ([id number]) -------------------------------------------------------------------------------- Lower Extremity Assessment Details Patient Name: [name], [name] Date of Service: 08/19/2017 10:30 AM Medical Record Number: [id number] Patient Account Number: [id number] Date of Birth/Sex: Dec 12, 1925 (84 y.o. M) Treating RN: [name] Primary Care Ehan Freas: [name] Other Clinician: Referring Kyl Givler: [name] Treating Mackinzee Roszak/Extender: [name] [name] Vascular Assessment Pulses: Dorsalis Pedis Palpable: [Left:No] [Right:No] Doppler Audible: [Left:Yes] [Right:Yes] Posterior Tibial Palpable: [Left:No] [Right:No] Doppler Audible: [Left:Yes] [Right:Yes] Extremity colors, hair growth, and conditions: Extremity Color: [Left:Mottled] [Right:Mottled] Hair Growth on Extremity: [Left:No] [Right:No] Temperature of Extremity: [Left:Cool] [Right:Cool] Capillary Refill: [Left:< 3 seconds] [Right:< 3 seconds] Blood Pressure: Brachial: [Left:98] [Right:110] Dorsalis Pedis: 120 [Left:Dorsalis Pedis: 016] Ankle: Posterior Tibial: 98 [Left:Posterior Tibial: 172 1.09] [Right:1.56] Toe Nail Assessment Left: Right: Thick: Yes Yes Discolored: Yes Yes Deformed: Yes Yes Improper Length and Hygiene: No No Electronic Signature(s) Signed:  08/20/2017 4:46:02 PM By: [name] Entered By: [name] on 08/19/2017 10:32:24 Spearman, Gervase L. ([id number]) -------------------------------------------------------------------------------- Multi Wound Chart Details Patient Name: [name], [name] Date of Service: 08/19/2017 10:30 AM Medical Record Number: [id number] Patient Account Number: [id number] Date of Birth/Sex: 1926/02/15 (84 y.o. M) Treating RN: [name] Primary Care Kyjuan Gause: [name] Other Clinician: Referring Cung Masterson: [name] Treating Shaquan Missey/Extender: [name] [name] Vital Signs Height(in): 70 Pulse(bpm): 71 Weight(lbs): 190 Blood Pressure(mmHg): 91/51 Body Mass Index(BMI): 27 Temperature(F): Respiratory Rate 16 (breaths/min): Photos: [1:No Photos] [2:No Photos] [N/A:N/A] Wound Location: [1:Left Calcaneus] [2:Right Calcaneus] [N/A:N/A] Wounding Event: [1:Pressure Injury] [2:Pressure Injury] [N/A:N/A] Primary Etiology: [1:Pressure Ulcer] [2:Pressure Ulcer] [N/A:N/A] Comorbid History: [1:Arrhythmia,  Hypertension, Received Radiation] [2:Arrhythmia, Hypertension, Received Radiation] [N/A:N/A] Date Acquired: [1:08/12/2017] [2:08/12/2017] [N/A:N/A] [name] of Treatment: [1:[name]] [2:[name]] [N/A:N/A] Wound Status: [1:Open] [2:Open] [N/A:N/A] Measurements L x W x D [1:6.2x4.7x0.2] [2:3.4x4.3x0.2] [N/A:N/A] (cm) Area (cm) : [1:22.887] [2:11.483] [N/A:N/A] Volume (cm) : [1:4.577] [2:2.297] [N/A:N/A] Classification: [1:Unstageable/Unclassified] [2:Unstageable/Unclassified] [N/A:N/A] Exudate Amount: [1:None Present] [2:None Present] [N/A:N/A] Wound Margin: [1:Thickened] [2:Thickened] [N/A:N/A] Granulation Amount: [1:None Present ([name]%)] [2:None Present ([name]%)] [N/A:N/A] Necrotic Amount: [1:Large (67-100%)] [2:Large (67-100%)] [N/A:N/A] Necrotic Tissue: [1:Eschar] [2:Eschar] [N/A:N/A] Exposed Structures: [1:Fascia: No Fat Layer (Subcutaneous Tissue) Exposed: No Tendon: No Muscle: No  Joint: No Bone: No] [2:Fascia: No Fat Layer (Subcutaneous Tissue) Exposed: No Tendon: No Muscle: No Joint: No Bone: No] [N/A:N/A] Epithelialization: [1:None] [2:None] [N/A:N/A] Periwound Skin Texture: [1:No Abnormalities Noted] [2:No Abnormalities Noted] [N/A:N/A] Periwound Skin Moisture: [1:No Abnormalities Noted] [2:No Abnormalities Noted] [N/A:N/A] Periwound Skin Color: [1:No Abnormalities Noted] [2:No Abnormalities Noted] [N/A:N/A] Temperature: [1:No Abnormality] [2:No Abnormality] [N/A:N/A] Tenderness on Palpation: [1:Yes] [2:Yes] [N/A:N/A] Wound Preparation: [1:Ulcer Cleansing: Rinsed/Irrigated with Saline] [2:Ulcer Cleansing: Rinsed/Irrigated with Saline] [N/A:N/A] Topical Anesthetic Applied: Topical Anesthetic Applied: Other: lidocaine 4% Other: lidocaine 4% Flakes, [place] ([id number]) Treatment Notes Electronic Signature(s) Signed: 08/20/2017 4:46:02 PM By: [name] Entered By: [name] on 08/19/2017 10:53:25 Steck, Cohl L. ([id number]) -------------------------------------------------------------------------------- [place] Details Patient Name: [name], MEIDINGER. Date of Service: 08/19/2017 10:30 AM Medical Record Number: [id number] Patient Account Number: [id number] Date of Birth/Sex: 06/07/1926 (84 y.o. M) Treating RN: [name] Primary Care Basir Niven: [name] Other Clinician: Referring Cadience Bradfield: [name] Treating Aarit Kashuba/Extender: [name] [name] Active Inactive ` Abuse / Safety / Falls / Self Care Management Nursing Diagnoses: History of Falls Potential for falls Goals: Patient will not experience any injury related to falls Date Initiated: 08/19/2017 Target Resolution Date: 12/14/2017 Goal Status: Active Interventions: Assess Activities of Daily Living upon admission and as needed Assess fall risk on admission and as needed Assess: immobility, friction, shearing, incontinence upon admission  and as needed Assess impairment of mobility on admission and as needed per policy Assess personal safety and home safety (as indicated) on admission and as needed Assess self care needs on admission and as needed Notes: ` Nutrition Nursing Diagnoses: Imbalanced nutrition Potential for alteratiion in Nutrition/Potential for imbalanced nutrition Goals: Patient/caregiver agrees to and verbalizes understanding of need to use nutritional supplements and/or vitamins as prescribed Date Initiated: 08/19/2017 Target Resolution Date: 11/16/2017 Goal Status: Active Interventions: Assess patient nutrition upon admission and as needed per policy Notes: ` Orientation to the Wound Care Program Nursing Diagnoses: Gunderson, Gen L. ([id number]) Knowledge deficit related to the wound healing center program Goals: Patient/caregiver will verbalize understanding of the [place] Date Initiated: 08/19/2017 Target Resolution Date: 09/14/2017 Goal Status: Active Interventions: Provide education on orientation to the wound center Notes: ` Pain, Acute or Chronic Nursing Diagnoses: Pain, acute or chronic: actual or potential Potential alteration in comfort, pain Goals: Patient/caregiver will verbalize adequate pain control between visits Date Initiated: 08/19/2017 Target Resolution Date: 12/14/2017 Goal Status: Active Interventions: Complete pain assessment as per visit requirements Notes: ` Pressure Nursing Diagnoses: Knowledge deficit related to causes and risk factors for pressure ulcer development Knowledge deficit related to management of pressures ulcers Potential for impaired tissue integrity related to pressure, friction, moisture, and shear Goals: Patient will remain free from development of additional pressure ulcers Date Initiated: 08/19/2017 Target Resolution Date: 12/14/2017 Goal Status: Active Interventions: Assess: immobility, friction, shearing, incontinence upon  admission and as needed Assess potential for  pressure ulcer upon admission and as needed Notes: ` Wound/Skin Impairment Nursing Diagnoses: Impaired tissue integrity Knowledge deficit related to ulceration/compromised skin integrity Fritzsche, Macai L. ([id number]) Goals: Ulcer/skin breakdown will have a volume reduction of 80% by week 12 Date Initiated: 08/19/2017 Target Resolution Date: 12/14/2017 Goal Status: Active Interventions: Assess patient/caregiver ability to perform ulcer/skin care regimen upon admission and as needed Assess ulceration(s) every visit Notes: Electronic Signature(s) Signed: 08/20/2017 4:46:02 PM By: [name] Entered By: [name] on 08/19/2017 10:53:11 Greaser, Blain L. ([id number]) -------------------------------------------------------------------------------- Pain Assessment Details Patient Name: [name], [name] Date of Service: 08/19/2017 10:30 AM Medical Record Number: [id number] Patient Account Number: [id number] Date of Birth/Sex: 1925/12/25 (84 y.o. M) Treating RN: [name] Primary Care Inette Doubrava: [name] Other Clinician: Referring Lilleigh Hechavarria: [name] Treating Emili Mcloughlin/Extender: [name] [name] Active Problems Location of Pain Severity and Description of Pain Patient Has Paino No Site Locations Pain Management and Medication Current Pain Management: Electronic Signature(s) Signed: 08/20/2017 4:46:02 PM By: [name] Entered By: [name] on 08/19/2017 10:09:35 Kennard, Marckus L. ([id number]) -------------------------------------------------------------------------------- Patient/Caregiver Education Details Patient Name: AGAPITO, HANWAY L. Date of Service: 08/19/2017 10:30 AM Medical Record Number: [id number] Patient Account Number: [id number] Date of Birth/Gender: [birth year] (84 y.o. M) Treating RN: [name] Primary Care Physician: [name] Other Clinician: Referring Physician:  Ramonita [name] Treating Physician/Extender: Sharalyn Ink in Treatment: [name] Education Assessment Education Provided To: Patient and Caregiver wife Education Topics Provided Welcome To [facility] [place]: Handouts: Welcome To [facility] Methods: Explain/Verbal Responses: State content correctly Wound/Skin Impairment: Handouts: Caring for Your Ulcer, Other: SNF notes/orders sent with pt Methods: Demonstration, Explain/Verbal Responses: State content correctly Electronic Signature(s) Signed: 08/20/2017 4:46:02 PM By: [name] Entered By: [name] on 08/19/2017 10:54:16 [name], [name] ([id number]) -------------------------------------------------------------------------------- Wound Assessment Details Patient Name: [name], [name] Date of Service: 08/19/2017 10:30 AM Medical Record Number: [id number] Patient Account Number: [id number] Date of Birth/Sex: 1925-08-29 (84 y.o. M) Treating RN: [name] Primary Care Kharon Hixon: [name] Other Clinician: Referring Tzion Wedel: [name] Treating Chattie Greeson/Extender: [name] [name] Wound Status Wound Number: 1 Primary Etiology: Pressure Ulcer Wound Location: Left Calcaneus Wound Status: Open Wounding Event: Pressure Injury Comorbid Arrhythmia, Hypertension, Received History: Radiation Date Acquired: 08/12/2017 [name] Of Treatment: [name] Clustered Wound: No Photos Photo Uploaded By: [name] on 08/20/2017 08:06:19 Wound Measurements Length: (cm) 6.2 Width: (cm) 4.7 Depth: (cm) [name].2 Area: (cm) 22.887 Volume: (cm) 4.577 % Reduction in Area: % Reduction in Volume: Epithelialization: None Tunneling: No Undermining: No Wound Description Classification: Unstageable/Unclassified Wound Margin: Thickened Exudate Amount: None Present Foul Odor After Cleansing: No Slough/Fibrino Yes Wound Bed Granulation Amount: None Present ([name]%) Exposed Structure Necrotic Amount:  Large (67-100%) Fascia Exposed: No Necrotic Quality: Eschar Fat Layer (Subcutaneous Tissue) Exposed: No Tendon Exposed: No Muscle Exposed: No Joint Exposed: No Bone Exposed: No Periwound Skin Texture Texture Color No Abnormalities Noted: No No Abnormalities Noted: No Esters, [place] ([id number]) Moisture Temperature / Pain No Abnormalities Noted: No Temperature: No Abnormality Tenderness on Palpation: Yes Wound Preparation Ulcer Cleansing: Rinsed/Irrigated with Saline Topical Anesthetic Applied: Other: lidocaine 4%, Electronic Signature(s) Signed: 08/19/2017 10:37:25 AM By: [name] Entered By: [name] on 08/19/2017 10:37:24 Dermody, Jarred L. ([id number]) -------------------------------------------------------------------------------- Wound Assessment Details Patient Name: [name], [name] Date of Service: 08/19/2017 10:30 AM Medical Record Number: [id number] Patient Account Number: [id number] Date of Birth/Sex: Jul 19, 1925 (84 y.o. M) Treating RN: [name] Primary Care Leather Estis:  [name] Other Clinician: Referring Karsyn Jamie: [name] Treating Merritt Kibby/Extender: [name] [name] Wound Status Wound Number: 2 Primary Etiology: Pressure Ulcer Wound Location: Right Calcaneus Wound Status: Open Wounding Event: Pressure Injury Comorbid Arrhythmia, Hypertension, Received History: Radiation Date Acquired: 08/12/2017 [name] Of Treatment: [name] Clustered Wound: No Photos Photo Uploaded By: [name] on 08/20/2017 08:06:19 Wound Measurements Length: (cm) 3.4 Width: (cm) 4.3 Depth: (cm) [name].2 Area: (cm) 11.483 Volume: (cm) 2.297 % Reduction in Area: % Reduction in Volume: Epithelialization: None Tunneling: No Undermining: No Wound Description Classification: Unstageable/Unclassified Wound Margin: Thickened Exudate Amount: None Present Foul Odor After Cleansing: No Slough/Fibrino Yes Wound Bed Granulation Amount: None  Present ([name]%) Exposed Structure Necrotic Amount: Large (67-100%) Fascia Exposed: No Necrotic Quality: Eschar Fat Layer (Subcutaneous Tissue) Exposed: No Tendon Exposed: No Muscle Exposed: No Joint Exposed: No Bone Exposed: No Periwound Skin Texture Texture Color No Abnormalities Noted: No No Abnormalities Noted: No Ben, [place] ([id number]) Moisture Temperature / Pain No Abnormalities Noted: No Temperature: No Abnormality Tenderness on Palpation: Yes Wound Preparation Ulcer Cleansing: Rinsed/Irrigated with Saline Topical Anesthetic Applied: Other: lidocaine 4%, Electronic Signature(s) Signed: 08/19/2017 10:38:50 AM By: [name] Entered By: [name] on 08/19/2017 10:38:49 Boda, Shonn L. ([id number]) -------------------------------------------------------------------------------- Vitals Details Patient Name: [name], [name] Date of Service: 08/19/2017 10:30 AM Medical Record Number: [id number] Patient Account Number: [id number] Date of Birth/Sex: [birth year] (84 y.o. M) Treating RN: [name] Primary Care Taige Housman: [name] Other Clinician: Referring Thandiwe Siragusa: [name] Treating Asucena Galer/Extender: [name] [name] Vital Signs Time Taken: 10:09 Pulse (bpm): 71 Height (in): 70 Respiratory Rate (breaths/min): 16 Source: Stated Blood Pressure (mmHg): 91/51 Weight (lbs): 190 Reference Range: 80 - 120 mg / dl Source: Stated Body Mass Index (BMI): 27.3 Electronic Signature(s) Signed: 08/20/2017 4:46:02 PM By: [name] Entered By: [name] on 08/19/2017 10:10:16

## 2017-08-26 ENCOUNTER — Ambulatory Visit: Payer: PPO | Admitting: Physician Assistant

## 2017-08-26 DIAGNOSIS — L89620 Pressure ulcer of left heel, unstageable: Secondary | ICD-10-CM | POA: Diagnosis not present

## 2017-08-26 DIAGNOSIS — K219 Gastro-esophageal reflux disease without esophagitis: Secondary | ICD-10-CM | POA: Diagnosis not present

## 2017-08-26 DIAGNOSIS — E559 Vitamin D deficiency, unspecified: Secondary | ICD-10-CM | POA: Diagnosis not present

## 2017-08-26 DIAGNOSIS — I482 Chronic atrial fibrillation: Secondary | ICD-10-CM | POA: Diagnosis not present

## 2017-08-26 DIAGNOSIS — L89610 Pressure ulcer of right heel, unstageable: Secondary | ICD-10-CM | POA: Diagnosis not present

## 2017-08-26 DIAGNOSIS — Z7901 Long term (current) use of anticoagulants: Secondary | ICD-10-CM | POA: Diagnosis not present

## 2017-08-26 DIAGNOSIS — Z8546 Personal history of malignant neoplasm of prostate: Secondary | ICD-10-CM | POA: Diagnosis not present

## 2017-08-26 DIAGNOSIS — S32511D Fracture of superior rim of right pubis, subsequent encounter for fracture with routine healing: Secondary | ICD-10-CM | POA: Diagnosis not present

## 2017-08-26 DIAGNOSIS — S51812D Laceration without foreign body of left forearm, subsequent encounter: Secondary | ICD-10-CM | POA: Diagnosis not present

## 2017-08-26 DIAGNOSIS — I10 Essential (primary) hypertension: Secondary | ICD-10-CM | POA: Diagnosis not present

## 2017-08-26 DIAGNOSIS — Z9181 History of falling: Secondary | ICD-10-CM | POA: Diagnosis not present

## 2017-08-29 ENCOUNTER — Other Ambulatory Visit: Payer: Self-pay | Admitting: *Deleted

## 2017-08-29 NOTE — Patient Outreach (Signed)
[facility]) Care Management  08/29/2017  Nicolaos Mitrano. 13-Jun-1926 [id number]   Initial outreach call  Referral received :  08/29/17 Referral source : HTA  Referral reason : Discharge from SNF on 2/15, determine needs for transition of care .   Per chart review , 84 year old male, on 1/7 ED admission after fall at home noted pubic rami fracture, no surgical plan, WBAT on RLE.  Discharged to [facility]  on 1/9, DC to home on 08/23/17. PMH : includes,  Atrial fib, hypertension , GERD, Prostate cancer ,CVA, Left carotid endarterectomy, hyperlipidemia..   Placed initial call to patient introduced self, Patient verified HIPAA information attempted to explain reason for the call, patient stated this is the most inopertune time to talk, please call back tomorrow   Plan Will plan return call in the next day, will notify coverage partner of patient need for follow up call on next day to assess for transition of care needs.   Joylene Draft, RN, [place] Management Coordinator  [phone]- Mobile [phone]- Toll Free Main Office .

## 2017-08-30 ENCOUNTER — Other Ambulatory Visit: Payer: Self-pay

## 2017-08-30 NOTE — Patient Outreach (Signed)
Transition of care: New referral for Landis Martins RN Case Manager.  Placed call to patient and explained reason for call. Call with patient and wife jointly.  Patient reports that he was doing well until he got a urinary tract infection. Reports bloody urine and weakness.  Is currently on antibiotics for a UTI. Reports that he is starting to feel better.  Reports not as ambulatory as he was prior to the UTI. Reports eating well and denies any pain. Reports that his heels are about the same with limited healing at this time. Reports that he missed his wound clinic appointment due to weakness and not being able to get to the car.   Wife reports patient is active with [facility] and nurse has made 2 visit and physical therapist has also been to the home.    Wife reports that patient has all medications and is taking them as prescribed.  Patient does not have a follow up appointment yet with primary MD due to weakness.   PLAN: encouraged patient to make a follow up appointment. Reviewed importance of taking all medications as prescribed. Discussed when to call the MD for any changes in condition.    Reviewed with patient and wife assigned case mangers name and contact information. Reviewed that Landis Martins who call next week to set up a home visit. Patient and wife voiced understanding. Barrier letter sent to MD.   Tomasa Rand, RN, BSN, CEN [place] Coordinator [phone]

## 2017-09-03 ENCOUNTER — Other Ambulatory Visit: Payer: Self-pay | Admitting: *Deleted

## 2017-09-03 NOTE — Patient Outreach (Signed)
[facility]) Care Management  09/03/2017  [name] 02-05-1926 [id number]   Telephone assessment For follow up on transition of care .     Per chart review , 84 year old male, on 1/7 ED admission after fall at home noted pubic rami fracture, no surgical plan, WBAT on RLE.  Discharged to [facility]  on 1/9, DC to home on 08/23/17.   Successful telephone outreach to patient, introduced self, HIPAA information verified, explained reason for the call. Patient reports feeling pretty good on today. Patient then asked that I speak with his wife.   Wife discussed patient was walking pretty good while in the hospital, but has gotten weaker since being home and having UTI infection. Reports patient continues to take antibiotics.  Wife discussed Home health, initial visit with RN yesterday, [name] PT to visit on today an RN on Thursday.  Wife discussed how she assist patient with daily care,sponge bath, transfers to transport chair.  Wife reports that since patient cannot walk yet it would be hard to get him to doctors appointments yet, they do not have a ramp and she doesn't think patient can get down the steps yet.  Wife states she will make follow up MD visits after patient is able to move more.  Wife reports wounds to bilateral heels, had original visit to wound center while inpatient at SNF,need to schedule follow up visit when patient more mobile. Home health RN has placed dressing to both heels during her recent visit and plans dressing change at next visit this week.    Assessment  Will benefit from home visit for ongoing assessment of care and coordination needs.  Plan Patient and wife agreeable to home visit on this week .   Joylene Draft, RN, [place] Management Coordinator  [phone]- Mobile [phone]- Toll Free Main Office

## 2017-09-05 ENCOUNTER — Encounter: Payer: Self-pay | Admitting: *Deleted

## 2017-09-05 ENCOUNTER — Other Ambulatory Visit: Payer: Self-pay | Admitting: *Deleted

## 2017-09-05 NOTE — Patient Outreach (Addendum)
[facility]) Care Management   09/05/2017  Hussam Muniz. 12/11/1925 [id number]  Shayon Trompeter. is an 84 y.o. male    Per chart review , 84 year old male, on 1/7 ED admission after fall at home noted pubic rami fracture, no surgical plan, WBAT on RLE.  Discharged to [facility] on 1/9, DC to home on 08/23/17.   Subjective:  Patient discussed for  being able to back to walking as he was in repair.  Patient discussed visits  from home health on this week and every one working to try and help him. He discussed limits in movement due to hip and bilateral heel wounds.   Objective:  BP 112/78 (BP Location: Left Arm, Patient Position: Sitting, Cuff Size: Normal)   Pulse 68   Resp 18   SpO2 95%   Patient resting in recliner during visit .  Review of Systems  Constitutional: Negative.   HENT: Negative.   Eyes: Negative.   Respiratory: Negative.   Cardiovascular: Negative.   Gastrointestinal: Negative.   Genitourinary: Negative.   Musculoskeletal: Positive for joint pain.  Skin: Negative.   Neurological: Negative.   Endo/Heme/Allergies: Bruises/bleeds easily.  Psychiatric/Behavioral: Negative.     Physical Exam  Constitutional: He is oriented to person, place, and time. He appears well-developed and well-nourished.  Cardiovascular: Normal rate and normal heart sounds.  Respiratory: Effort normal.  GI: Soft. Bowel sounds are normal.  Neurological: He is alert and oriented to person, place, and time.  Skin: Skin is warm and dry.     Psychiatric: He has a normal mood and affect. His behavior is normal. Judgment and thought content normal.    Encounter Medications:   Outpatient Encounter Medications as of 09/05/2017  Medication Sig Note  . amLODipine (NORVASC) 5 MG tablet    . cephALEXin (KEFLEX) 500 MG capsule Take 500 mg by mouth 3 (three) times daily.   . Cholecalciferol (VITAMIN D-3 PO) Take by mouth.   . diphenhydramine-acetaminophen  (TYLENOL PM) 25-500 MG TABS tablet 1-1/2 tablets at bedtime.   . lovastatin (MEVACOR) 40 MG tablet Take by mouth at bedtime.   . Multiple Vitamin (MULTIVITAMIN) capsule Take 1 capsule by mouth daily.   . pantoprazole (PROTONIX) 40 MG tablet TAKE 1 TABLET BY MOUTH EVERY DAY   . terazosin (HYTRIN) 2 MG capsule Take 2 mg by mouth at bedtime. 2TABS DAILY   . vitamin B-12 (CYANOCOBALAMIN) 1000 MCG tablet Take 1,000 mcg by mouth daily.   Marland Kitchen warfarin (COUMADIN) 2 MG tablet Take 4 mg by mouth daily. 2 TABS DAILY  08/30/2017: Currently taking 3mg  daily  . bisacodyl (DULCOLAX) 5 MG EC tablet Take 1 tablet (5 mg total) by mouth daily as needed for moderate constipation. (Patient not taking: Reported on 08/30/2017)   . HYDROcodone-acetaminophen (NORCO/VICODIN) 5-325 MG tablet Take 1-2 tablets by mouth every 4 (four) hours as needed for moderate pain. (Patient not taking: Reported on 08/30/2017)   . methocarbamol (ROBAXIN) 500 MG tablet Take 1 tablet (500 mg total) by mouth every 6 (six) hours as needed for muscle spasms. (Patient not taking: Reported on 08/30/2017)    No facility-administered encounter medications on file as of 09/05/2017.   Patient was recently discharged from hospital and all medications have been reviewed.  Functional Status:   In your present state of health, do you have any difficulty performing the following activities: 09/03/2017 07/15/2017  Hearing? Y N  Vision? N N  Difficulty concentrating or making  decisions? N N  Walking or climbing stairs? Y N  Comment unable, home health PT working with patient  -  Dressing or bathing? Y N  Comment wife helps  -  Doing errands, shopping? Y N  Comment wife drives, decreased mobility at this time  -  Conservation officer, nature and eating ? N -  Comment wife assist  -  Using the Toilet? Y -  Comment assist from wife  -  In the past six months, have you accidently leaked urine? Y -  Do you have problems with loss of bowel control? N -  Managing your  Medications? Y -  Comment wife helps  -  Managing your Finances? N -  Housekeeping or managing your Housekeeping? N -  Comment wife does house work.  -  Some recent data might be hidden    Fall/Depression Screening:    Fall Risk  09/03/2017  Falls in the past year? Yes  Number falls in past yr: 2 or more  Injury with Fall? Yes  Risk Factor Category  High Fall Risk  Risk for fall due to : History of fall(s);Impaired balance/gait  Follow up Falls prevention discussed   PHQ 2/9 Scores 09/05/2017  PHQ - 2 Score 0    Assessment:   Initial home visit, patient wife present.  [facility] Welcome packet provided Patient followed by [facility] , home health RN, PT, and OT. Home health PT visit prior to my visit .   Left Pubic fracture Reports pain controlled, limited mobility . Needs follow up visit with orthopedic MD rescheduled.   Bilateral heel wounds [place] foam dressing to heels, HHRN for visit today for dressing/wound care. Patient has one visit to wound center while in rehab. Decreased mobility limiting him in getting down steps to car. Wife has cancelled follow up appointment until mobility improved. Patient has prevalon boot to wear if in bed.Patient is sleeping in recliner at night and there during the day so heels are floated. Home physical therapy evaluating for off loading shoe for ambulation.  UTI No new symptoms, continuing antibiotic, wife fills weekly pill organizer and helps with daily medications. No cost concerns related to medications.  Hx Chronic Atrial fib HHRN is checking INR and results to cardiology office. Heart rate controlled.  Social Patient sleeping in recliner chair, with his wife sleeping on sofa near him. She is able to assist patient with ADL's, assisting with transferring to transport chair and assisting to bathroom, she declined use of bedside commode.  Patient has at least 4 steps to exit home at this time his is not able to ambulate down steps. Discussed options  of ramp temporary,they hope for patient progression in mobility. Reports they have friend and church support, if ramp is needed her church would be able to provide.   Wife has rescheduled PCP visit for 3/13, needs Ortho and wound clinic appointments she is agreeable to rescheduling as mobility improves .   Plan:  Reviewed welcome packet.  Reinforced completing antibiotic and notifying MD of recurrent symptoms RNCM will continue weekly outreaches for transition of care , next call in a week, and care coordination/collaboration call to home health .  Will send PCP visit note.   [facility] CM Care Plan Problem One     Most Recent Value  Care Plan Problem One  Recent admission for pelvic fracture.  Role Documenting the Problem One  Care Management Coordinator  Care Plan for Problem One  Active  [facility] Long Term Goal  Patient will not experience a hospital admission in the next 31 days   [facility] Long Term Goal Start Date  08/30/17  Interventions for Problem One Long Term Goal  Discussed completing full antibiotic course,reviewed clinical status for recurrent UTI symptoms of buring with urination, odor, color changes , weakness increased   [facility] CM Short Term Goal #1   Patient will report following up with primary MD in the next 2 weeks.   [facility] CM Short Term Goal #1 Start Date  08/30/17  Interventions for Short Term Goal #1  Discussed with wife importance of attending PCP post discharge, discussed barrier to scheduling visit, will further assess needs at home visit this week   [facility] CM Short Term Goal #2   Patient reports that he has decreased weakness in the next 30 days.   [facility] CM Short Term Goal #2 Start Date  08/30/17  Interventions for Short Term Goal #2  Reinforced with patient participation in therapy exercises they have provided for him daily,    [facility] CM Short Term Goal #3  Patient will be able to report healing to wound in the next 30 days   [facility] CM Short Term Goal #3 Start Date  09/06/17  Interventions for  Short Tern Goal #3  Advised regarding , keeping heels floated, discussed benefits of nutrition in healing , balanced diet with adequate protein, discussed protein food examples to include in diet, reviewed benefits of nutritional supplements drinks , ensure, boost as examples       Joylene Draft, RN, [place] Management Coordinator  [phone]- Mobile [phone]- [place]

## 2017-09-12 ENCOUNTER — Other Ambulatory Visit: Payer: Self-pay | Admitting: *Deleted

## 2017-09-12 NOTE — Patient Outreach (Signed)
[facility]) Care Management  09/12/2017  [name] 1925-08-11 [id number]    Transition of care call   84  year old male, on 1/7 ED admission after fall at home noted pubic rami fracture, no surgical plan, WBAT on RLE.  Discharged to [facility] on 1/9, DC to home on 08/23/17.   Successful outreach call to patient wife, HIPAA information verified. Wife reported being uplifted by patient being able to walk some on this week. She discussed that his heels since bother him.  Wife reports patient has appointment with wound clinic on tomorrow, and then home health RN will monitor and care for wound at home per MD orders. Wife will discuss at visit tomorrow off loading shoe to help with taking pressure off heels when walking to help with discomfort.   Wife states [facility] home health, physical therapy plans to come to her home to assist patient down the steps to the car. Wife states she will be able to  manage getting him  into the car and into wheelchair at visit.  Asked wife about the plan to get patient back into the house, she states it is her understanding therapy will assist with getting him back into the house, she will call to clarify within and notify RNCM if concerns.   Wife states she does not want to pursue getting ramp for home.   Patient has completed all antibiotics, wife reports urinary tract infection has cleared.  Wife reports that she has not rescheduled PCP visit, be she plans to do today.  Plan  Will plan follow up telephone call in the next week for transition of care .   Joylene Draft, RN, [place] Management Coordinator  [phone]- Mobile [phone]- Toll Free Main Office

## 2017-09-13 ENCOUNTER — Encounter: Payer: PPO | Attending: Physician Assistant | Admitting: Physician Assistant

## 2017-09-13 DIAGNOSIS — S91101A Unspecified open wound of right great toe without damage to nail, initial encounter: Secondary | ICD-10-CM | POA: Diagnosis not present

## 2017-09-13 DIAGNOSIS — L89619 Pressure ulcer of right heel, unspecified stage: Secondary | ICD-10-CM | POA: Insufficient documentation

## 2017-09-13 DIAGNOSIS — I482 Chronic atrial fibrillation: Secondary | ICD-10-CM | POA: Insufficient documentation

## 2017-09-13 DIAGNOSIS — Z87891 Personal history of nicotine dependence: Secondary | ICD-10-CM | POA: Diagnosis not present

## 2017-09-13 DIAGNOSIS — Z7901 Long term (current) use of anticoagulants: Secondary | ICD-10-CM | POA: Insufficient documentation

## 2017-09-13 DIAGNOSIS — I10 Essential (primary) hypertension: Secondary | ICD-10-CM | POA: Insufficient documentation

## 2017-09-13 DIAGNOSIS — L89629 Pressure ulcer of left heel, unspecified stage: Secondary | ICD-10-CM | POA: Diagnosis not present

## 2017-09-13 DIAGNOSIS — L89610 Pressure ulcer of right heel, unstageable: Secondary | ICD-10-CM | POA: Diagnosis present

## 2017-09-17 NOTE — Progress Notes (Signed)
Arthurs, Burl L. ([id number]) Visit Report for 09/13/2017 Chief Complaint Document Details Patient Name: [name], [name] Date of Service: 09/13/2017 12:45 PM Medical Record Number: [id number] Patient Account Number: [id number] Date of Birth/Sex: [birth year] (84 y.o. Male) Treating RN: Montey Hora Primary Care Provider: Ramonita Lab Other Clinician: Referring Provider: Ramonita Lab Treating Provider/Extender: Melburn Hake, Geonna Lockyer Weeks in Treatment: 3 Information Obtained from: Patient Chief Complaint Bilateral Heel pressure ulcers and bilateral great toe DTIs Electronic Signature(s) Signed: 09/13/2017 6:07:32 PM By: Worthy Keeler PA-C Entered By: Worthy Keeler on 09/13/2017 12:51:56 Tango, Fabrizzio L. ([id number]) -------------------------------------------------------------------------------- HPI Details Patient Name: [name], [name] Date of Service: 09/13/2017 12:45 PM Medical Record Number: [id number] Patient Account Number: [id number] Date of Birth/Sex: 1926/04/06 (84 y.o. Male) Treating RN: Montey Hora Primary Care Provider: Ramonita Lab Other Clinician: Referring Provider: Ramonita Lab Treating Provider/Extender: Melburn Hake, Shenicka Sunderlin Weeks in Treatment: 3 History of Present Illness Associated Signs and Symptoms: Patient has a history of chronic atrophic relation, hypertension, and long-term use of anticoagulants. He also had a fall with pelvic fracture which occurred on January 2019. HPI Description: 08/19/17 patient presents today for initial evaluation and our clinic concerning issues he has been having with the bilateral heels as well as the lateral great toe was since he was admitted to the nursing facility following a pelvic fracture. Initially he was spending a significant amount of time in the bed although he is now up more often since the healing process has progressed along the way obviously. When he is up he's spending most of the time sitting in a wheelchair at this point. When he  is in bed he lays on his back and the covers/sheets are pushing down on his toes bilaterally which I think it may be what's causing the issues with his deep tissue injury to the bilateral great toes. With that being said the hills also appear to be pressure in nature and I think that laying in the bed getting pressure to the sites is what has led to the formation of these on stage will pressure ulcers. Fortunately there does not appear to be any infection in both areas of ulceration of the heel appear to be stable at this point. Patient does not have any significant pain at the site she does have some of the eschar along the edges which is starting to lift up and come all hopefully slowly but surely this will occur. Patient did have arterial studies performed April 2018 which showed a normal TBI on the right knee slightly depressed TBI on the left but this does not appear to have changed significantly at that point. Specifically this was 0.75 on the right and 0.61 on the left. 09/13/17 on evaluation today patient presents for a follow-up evaluation regarding his bilateral heal ulcers. He has been tolerating the dressing changes fairly well although he did have some bleeding earlier today when it appears some of the skin on the surrounding portion of the wound got pulled where this is starting to flake off. Subsequently there does not appear to be any bleeding right now. Since I last saw him he has been discharged from the facility and is now back at home. His wife is having a very difficult time being able to get him up and in the car in order to come for an appointment which is why they missed the last appointment have not been able to come back sooner. Fortunately the he does not have any signs of infection. No  fevers chills noted. He does have difficulty walking due to pain in the bilateral heels. Electronic Signature(s) Signed: 09/13/2017 6:07:32 PM By: Worthy Keeler PA-C Entered By: Worthy Keeler on 09/13/2017 13:46:45 Osei, Amaan L. ([id number]) -------------------------------------------------------------------------------- Physical Exam Details Patient Name: [name], [name] Date of Service: 09/13/2017 12:45 PM Medical Record Number: [id number] Patient Account Number: [id number] Date of Birth/Sex: [birth year] (84 y.o. Male) Treating RN: Montey Hora Primary Care Provider: Ramonita Lab Other Clinician: Referring Provider: Ramonita Lab Treating Provider/Extender: STONE III, Celica Kotowski Weeks in Treatment: 3 Constitutional Well-nourished and well-hydrated in no acute distress. Respiratory normal breathing without difficulty. clear to auscultation bilaterally. Cardiovascular regular rate and rhythm with normal S1, S2. Psychiatric this patient is able to make decisions and demonstrates good insight into disease process. Alert and Oriented x 3. pleasant and cooperative. Notes Patient's bilateral heal ulcers remain showing evidence of a stable eschar fortunately there does not appear to be any evidence of infection at this point. Patient did have some discomfort in regard to the left heel I feel like this is likely due to the fact that walking on this causes him pain plus the fact that some of the edge of the eschar which was lifting up actually got pulled which has caused some increased discomfort. There does not appear to be any bleeding at this point however. Electronic Signature(s) Signed: 09/13/2017 6:07:32 PM By: Worthy Keeler PA-C Entered By: Worthy Keeler on 09/13/2017 13:48:46 Loder, Galo L. ([id number]) -------------------------------------------------------------------------------- Physician Orders Details Patient Name: [name], [name] Date of Service: 09/13/2017 12:45 PM Medical Record Number: [id number] Patient Account Number: [id number] Date of Birth/Sex: 11-04-1925 (84 y.o. Male) Treating RN: Montey Hora Primary Care Provider: Ramonita Lab Other  Clinician: Referring Provider: Ramonita Lab Treating Provider/Extender: Melburn Hake, Demitrious Mccannon Weeks in Treatment: 3 Verbal / Phone Orders: No Diagnosis Coding ICD-10 Coding Code Description L89.620 Pressure ulcer of left heel, unstageable L89.610 Pressure ulcer of right heel, unstageable I48.2 Chronic atrial fibrillation I10 Essential (primary) hypertension Z79.01 Long term (current) use of anticoagulants Wound Cleansing Wound #1 Left Calcaneus o Clean wound with Normal Saline. o Cleanse wound with mild soap and water o May Shower, gently pat wound dry prior to applying new dressing. Wound #2 Right Calcaneus o Clean wound with Normal Saline. o Cleanse wound with mild soap and water o May Shower, gently pat wound dry prior to applying new dressing. Wound #3 Toe Great o Clean wound with Normal Saline. o Cleanse wound with mild soap and water o May Shower, gently pat wound dry prior to applying new dressing. Primary Wound Dressing Wound #1 Left Calcaneus o Other: - provodone-iodine Wound #2 Right Calcaneus o Other: - provodone-iodine Wound #3 Toe Great o Other: - provodone-iodine Secondary Dressing Wound #1 Left Calcaneus o Dry Gauze o Foam Wound #2 Right Calcaneus o Dry Gauze o Foam [place], Derron L. ([id number]) Wound #3 Toe Great o Other - no bandages Dressing Change Frequency Wound #1 Left Calcaneus o Change dressing twice daily. - morning and evening Wound #2 Right Calcaneus o Change dressing twice daily. - morning and evening Wound #3 Toe Great o Change dressing twice daily. - morning and evening Follow-up Appointments Wound #1 Left Calcaneus o Return Appointment in 1 week. Wound #2 Right Calcaneus o Return Appointment in 1 week. Wound #3 Toe Great o Return Appointment in 1 week. Off-Loading Wound #1 Left Calcaneus o Heel suspension boot to: - Please order prevalon boots for bilateral feet Please float heel when pt  is in the bed (NO PRESSURE ON HEELS) o Turn and reposition every 2 hours o Other: - Adjustable Blanket Lift Bar so the sheets and blanket does not put pressure on the toes Wound #2 Right Calcaneus o Heel suspension boot to: - Please order prevalon boots for bilateral feet Please float heel when pt is in the bed (NO PRESSURE ON HEELS) o Turn and reposition every 2 hours o Other: - Adjustable Blanket Lift Bar so the sheets and blanket does not put pressure on the toes Wound #3 Toe Great o Heel suspension boot to: - Please order prevalon boots for bilateral feet Please float heel when pt is in the bed (NO PRESSURE ON HEELS) o Turn and reposition every 2 hours o Other: - Adjustable Blanket Lift Bar so the sheets and blanket does not put pressure on the toes Additional Orders / Instructions Wound #1 Left Calcaneus o Increase protein intake. Wound #2 Right Calcaneus o Increase protein intake. Wound #3 Toe Great o Increase protein intake. Home Health Wound #1 Left Calcaneus [place], [place] ([id number]) o [place] Nurse may visit PRN to address patientos wound care needs. o FACE TO FACE ENCOUNTER: MEDICARE and MEDICAID PATIENTS: I certify that this patient is under my care and that I had a face-to-face encounter that meets the physician face-to-face encounter requirements with this patient on this date. The encounter with the patient was in whole or in part for the following MEDICAL CONDITION: (primary reason for [place]) MEDICAL NECESSITY: I certify, that based on my findings, NURSING services are a medically necessary home health service. HOME BOUND STATUS: I certify that my clinical findings support that this patient is homebound (i.e., Due to illness or injury, pt requires aid of supportive devices such as crutches, cane, wheelchairs, walkers, the use of special transportation or the assistance of another person to  leave their place of residence. There is a normal inability to leave the home and doing so requires considerable and taxing effort. Other absences are for medical reasons / religious services and are infrequent or of short duration when for other reasons). o If current dressing causes regression in wound condition, may D/C ordered dressing product/s and apply Normal Saline Moist Dressing daily until next [place] / Other MD appointment. [place] of regression in wound condition at [phone]. o Please direct any NON-WOUND related issues/requests for orders to patient's Primary Care Physician Wound #2 Right [place] Nurse may visit PRN to address patientos wound care needs. o FACE TO FACE ENCOUNTER: MEDICARE and MEDICAID PATIENTS: I certify that this patient is under my care and that I had a face-to-face encounter that meets the physician face-to-face encounter requirements with this patient on this date. The encounter with the patient was in whole or in part for the following MEDICAL CONDITION: (primary reason for [place]) MEDICAL NECESSITY: I certify, that based on my findings, NURSING services are a medically necessary home health service. HOME BOUND STATUS: I certify that my clinical findings support that this patient is homebound (i.e., Due to illness or injury, pt requires aid of supportive devices such as crutches, cane, wheelchairs, walkers, the use of special transportation or the assistance of another person to leave their place of residence. There is a normal inability to leave the home and doing so requires considerable and taxing effort. Other absences are for medical reasons / religious services and are infrequent  or of short duration when for other reasons). o If current dressing causes regression in wound condition, may D/C ordered dressing product/s and apply Normal Saline Moist  Dressing daily until next [place] / Other MD appointment. [place] of regression in wound condition at [phone]. o Please direct any NON-WOUND related issues/requests for orders to patient's Primary Care Physician Wound #3 [place] Nurse may visit PRN to address patientos wound care needs. o FACE TO FACE ENCOUNTER: MEDICARE and MEDICAID PATIENTS: I certify that this patient is under my care and that I had a face-to-face encounter that meets the physician face-to-face encounter requirements with this patient on this date. The encounter with the patient was in whole or in part for the following MEDICAL CONDITION: (primary reason for [place]) MEDICAL NECESSITY: I certify, that based on my findings, NURSING services are a medically necessary home health service. HOME BOUND STATUS: I certify that my clinical findings support that this patient is homebound (i.e., Due to illness or injury, pt requires aid of supportive devices such as crutches, cane, wheelchairs, walkers, the use of special transportation or the assistance of another person to leave their place of residence. There is a normal inability to leave the home and doing so requires considerable and taxing effort. Other absences are for medical reasons / religious services and are infrequent or of short duration when for other reasons). o If current dressing causes regression in wound condition, may D/C ordered dressing product/s and apply Normal Saline Moist Dressing daily until next [place] / Other MD appointment. [place] of regression in wound condition at [phone]. o Please direct any NON-WOUND related issues/requests for orders to patient's Primary Care Physician Electronic Signature(s) Signed: 09/13/2017 4:45:57 PM By: Montey Hora Signed: 09/13/2017 6:07:32 PM By: Worthy Keeler PA-C [place], [place] ([id number]) Entered By: Montey Hora on 09/13/2017 13:38:01 [place], [place] ([id number]) -------------------------------------------------------------------------------- Problem List Details Patient Name: FRASIER, Nieves L. Date of Service: 09/13/2017 12:45 PM Medical Record Number: [id number] Patient Account Number: [id number] Date of Birth/Sex: 1926-01-25 (84 y.o. Male) Treating RN: Montey Hora Primary Care Provider: Ramonita Lab Other Clinician: Referring Provider: Ramonita Lab Treating Provider/Extender: Melburn Hake, Thinh Cuccaro Weeks in Treatment: 3 Active Problems ICD-10 Encounter Code Description Active Date Diagnosis L89.620 Pressure ulcer of left heel, unstageable 08/19/2017 Yes L89.610 Pressure ulcer of right heel, unstageable 08/19/2017 Yes I48.2 Chronic atrial fibrillation 08/19/2017 Yes I10 Essential (primary) hypertension 08/19/2017 Yes Z79.01 Long term (current) use of anticoagulants 08/19/2017 Yes Inactive Problems Resolved Problems Electronic Signature(s) Signed: 09/13/2017 6:07:32 PM By: Worthy Keeler PA-C Entered By: Worthy Keeler on 09/13/2017 13:11:21 Heatley, Hom[place] ([id number]) -------------------------------------------------------------------------------- Progress Note Details Patient Name: Prine, Xaden L. Date of Service: 09/13/2017 12:45 PM Medical Record Number: [id number] Patient Account Number: [id number] Date of Birth/Sex: March 22, 1926 (84 y.o. Male) Treating RN: Montey Hora Primary Care Provider: Ramonita Lab Other Clinician: Referring Provider: Ramonita Lab Treating Provider/Extender: Melburn Hake, Demond Shallenberger Weeks in Treatment: 3 Subjective Chief Complaint Information obtained from Patient Bilateral Heel pressure ulcers and bilateral great toe DTIs History of Present Illness (HPI) The following HPI elements were documented for the patient's wound: Associated Signs and Symptoms: Patient has a history of chronic atrophic relation, hypertension, and long-term  use of anticoagulants. He also had a fall with pelvic fracture which occurred on January 2019. 08/19/17 patient presents today for initial evaluation and our clinic concerning issues he has been  having with the bilateral heels as well as the lateral great toe was since he was admitted to the nursing facility following a pelvic fracture. Initially he was spending a significant amount of time in the bed although he is now up more often since the healing process has progressed along the way obviously. When he is up he's spending most of the time sitting in a wheelchair at this point. When he is in bed he lays on his back and the covers/sheets are pushing down on his toes bilaterally which I think it may be what's causing the issues with his deep tissue injury to the bilateral great toes. With that being said the hills also appear to be pressure in nature and I think that laying in the bed getting pressure to the sites is what has led to the formation of these on stage will pressure ulcers. Fortunately there does not appear to be any infection in both areas of ulceration of the heel appear to be stable at this point. Patient does not have any significant pain at the site she does have some of the eschar along the edges which is starting to lift up and come all hopefully slowly but surely this will occur. Patient did have arterial studies performed April 2018 which showed a normal TBI on the right knee slightly depressed TBI on the left but this does not appear to have changed significantly at that point. Specifically this was 0.75 on the right and 0.61 on the left. 09/13/17 on evaluation today patient presents for a follow-up evaluation regarding his bilateral heal ulcers. He has been tolerating the dressing changes fairly well although he did have some bleeding earlier today when it appears some of the skin on the surrounding portion of the wound got pulled where this is starting to flake off.  Subsequently there does not appear to be any bleeding right now. Since I last saw him he has been discharged from the facility and is now back at home. His wife is having a very difficult time being able to get him up and in the car in order to come for an appointment which is why they missed the last appointment have not been able to come back sooner. Fortunately the he does not have any signs of infection. No fevers chills noted. He does have difficulty walking due to pain in the bilateral heels. Patient History Information obtained from Patient. Family History No family history of Cancer, Diabetes, Heart Disease, Hereditary Spherocytosis, Hypertension, Kidney Disease, Lung Disease, Seizures, Stroke, Thyroid Problems, Tuberculosis. Social History Former smoker, Marital Status - Married, Alcohol Use - Rarely - 2oz day, Drug Use - No History, Caffeine Use - Daily. Review of Systems (ROS) Constitutional Symptoms (General Health) Denies complaints or symptoms of Fever, Chills. Hnat, Storm L. ([id number]) Respiratory The patient has no complaints or symptoms. Cardiovascular The patient has no complaints or symptoms. Psychiatric The patient has no complaints or symptoms. Objective Constitutional Well-nourished and well-hydrated in no acute distress. Vitals Time Taken: 12:57 PM, Height: 70 in, Weight: 190 lbs, BMI: 27.3, Temperature: 97.5 F, Pulse: 81 bpm, Respiratory Rate: 16 breaths/min, Blood Pressure: 100/48 mmHg. Respiratory normal breathing without difficulty. clear to auscultation bilaterally. Cardiovascular regular rate and rhythm with normal S1, S2. Psychiatric this patient is able to make decisions and demonstrates good insight into disease process. Alert and Oriented x 3. pleasant and cooperative. General Notes: Patient's bilateral heal ulcers remain showing evidence of a stable eschar fortunately there does not appear  to be any evidence of infection at this point.  Patient did have some discomfort in regard to the left heel I feel like this is likely due to the fact that walking on this causes him pain plus the fact that some of the edge of the eschar which was lifting up actually got pulled which has caused some increased discomfort. There does not appear to be any bleeding at this point however. Integumentary (Hair, Skin) Wound #1 status is Open. Original cause of wound was Pressure Injury. The wound is located on the Left Calcaneus. The wound measures 5.5cm length x 4.2cm width x 0.2cm depth; 18.143cm^2 area and 3.629cm^3 volume. Wound #2 status is Open. Original cause of wound was Pressure Injury. The wound is located on the Right Calcaneus. The wound measures 3.6cm length x 3.1cm width x 0.2cm depth; 8.765cm^2 area and 1.753cm^3 volume. There is no tunneling or undermining noted. There is a large amount of sanguinous drainage noted. The wound margin is thickened. There is no granulation within the wound bed. There is a large (67-100%) amount of necrotic tissue within the wound bed including Eschar. Periwound temperature was noted as No Abnormality. The periwound has tenderness on palpation. Wound #3 status is Open. Original cause of wound was Pressure Injury. The wound is located on the [employer] The wound measures 0.5cm length x 0.5cm width x 0.1cm depth; 0.196cm^2 area and 0.02cm^3 volume. There is no tunneling or undermining noted. There is a medium amount of serosanguineous drainage noted. The wound margin is flat and intact. There is no granulation within the wound bed. There is a large (67-100%) amount of necrotic tissue within the wound bed including Eschar and Adherent Slough. The periwound skin appearance did not exhibit: Callus, Crepitus, Excoriation, Induration, Rash, Scarring, Dry/Scaly, Maceration, Atrophie Blanche, Cyanosis, Ecchymosis, Hemosiderin Staining, Mottled, Pallor, Rubor, Erythema. Periwound temperature was noted as No  Abnormality. Varkey, Marteze L. ([id number]) Assessment Active Problems ICD-10 L89.620 - Pressure ulcer of left heel, unstageable L89.610 - Pressure ulcer of right heel, unstageable I48.2 - Chronic atrial fibrillation I10 - Essential (primary) hypertension Z79.01 - Long term (current) use of anticoagulants Plan Wound Cleansing: Wound #1 Left Calcaneus: Clean wound with Normal Saline. Cleanse wound with mild soap and water May Shower, gently pat wound dry prior to applying new dressing. Wound #2 Right Calcaneus: Clean wound with Normal Saline. Cleanse wound with mild soap and water May Shower, gently pat wound dry prior to applying new dressing. Wound #3 Toe Great: Clean wound with Normal Saline. Cleanse wound with mild soap and water May Shower, gently pat wound dry prior to applying new dressing. Primary Wound Dressing: Wound #1 Left Calcaneus: Other: - provodone-iodine Wound #2 Right Calcaneus: Other: - provodone-iodine Wound #3 Toe Great: Other: - provodone-iodine Secondary Dressing: Wound #1 Left Calcaneus: Dry Gauze Foam Wound #2 Right Calcaneus: Dry Gauze Foam Wound #3 Toe Great: Other - no bandages Dressing Change Frequency: Wound #1 Left Calcaneus: Change dressing twice daily. - morning and evening Wound #2 Right Calcaneus: Change dressing twice daily. - morning and evening Wound #3 Toe Great: Change dressing twice daily. - morning and evening Follow-up Appointments: Wound #1 Left Calcaneus: [place], [place] ([id number]) Return Appointment in 1 week. Wound #2 Right Calcaneus: Return Appointment in 1 week. Wound #3 Toe Great: Return Appointment in 1 week. Off-Loading: Wound #1 Left Calcaneus: Heel suspension boot to: - Please order prevalon boots for bilateral feet Please float heel when pt is in the bed (NO PRESSURE ON HEELS)  Turn and reposition every 2 hours Other: - Adjustable Blanket Lift Bar so the sheets and blanket does not put pressure on the  toes Wound #2 Right Calcaneus: Heel suspension boot to: - Please order prevalon boots for bilateral feet Please float heel when pt is in the bed (NO PRESSURE ON HEELS) Turn and reposition every 2 hours Other: - Adjustable Blanket Lift Bar so the sheets and blanket does not put pressure on the toes Wound #3 Toe Great: Heel suspension boot to: - Please order prevalon boots for bilateral feet Please float heel when pt is in the bed (NO PRESSURE ON HEELS) Turn and reposition every 2 hours Other: - Adjustable Blanket Lift Bar so the sheets and blanket does not put pressure on the toes Additional Orders / Instructions: Wound #1 Left Calcaneus: Increase protein intake. Wound #2 Right Calcaneus: Increase protein intake. Wound #3 Toe Great: Increase protein intake. Home Health: Wound #1 Left Calcaneus: [place] Nurse may visit PRN to address patient s wound care needs. FACE TO FACE ENCOUNTER: MEDICARE and MEDICAID PATIENTS: I certify that this patient is under my care and that I had a face-to-face encounter that meets the physician face-to-face encounter requirements with this patient on this date. The encounter with the patient was in whole or in part for the following MEDICAL CONDITION: (primary reason for [place]) MEDICAL NECESSITY: I certify, that based on my findings, NURSING services are a medically necessary home health service. HOME BOUND STATUS: I certify that my clinical findings support that this patient is homebound (i.e., Due to illness or injury, pt requires aid of supportive devices such as crutches, cane, wheelchairs, walkers, the use of special transportation or the assistance of another person to leave their place of residence. There is a normal inability to leave the home and doing so requires considerable and taxing effort. Other absences are for medical reasons / religious services and are infrequent or of short duration when for other  reasons). If current dressing causes regression in wound condition, may D/C ordered dressing product/s and apply Normal Saline Moist Dressing daily until next [place] / Other MD appointment. [place] of regression in wound condition at [phone]. Please direct any NON-WOUND related issues/requests for orders to patient's Primary Care Physician Wound #2 Right Calcaneus: [place] Nurse may visit PRN to address patient s wound care needs. FACE TO FACE ENCOUNTER: MEDICARE and MEDICAID PATIENTS: I certify that this patient is under my care and that I had a face-to-face encounter that meets the physician face-to-face encounter requirements with this patient on this date. The encounter with the patient was in whole or in part for the following MEDICAL CONDITION: (primary reason for [place]) MEDICAL NECESSITY: I certify, that based on my findings, NURSING services are a medically necessary home health service. HOME BOUND STATUS: I certify that my clinical findings support that this patient is homebound (i.e., Due to illness or injury, pt requires aid of supportive devices such as crutches, cane, wheelchairs, walkers, the use of special transportation or the assistance of another person to leave their place of residence. There is a normal inability to leave the home and doing so requires considerable and taxing effort. Other absences are for medical reasons / religious services and are infrequent or of short duration when for other reasons). If current dressing causes regression in wound condition, may D/C ordered dressing product/s and apply Normal Saline Moist Dressing daily  until next [place] / Other MD appointment. [place] of regression in wound condition at [phone]. Abrell, Eliyahu L. ([id number]) Please direct any NON-WOUND related issues/requests for orders to patient's Primary Care  Physician Wound #3 Toe Great: [place] Nurse may visit PRN to address patient s wound care needs. FACE TO FACE ENCOUNTER: MEDICARE and MEDICAID PATIENTS: I certify that this patient is under my care and that I had a face-to-face encounter that meets the physician face-to-face encounter requirements with this patient on this date. The encounter with the patient was in whole or in part for the following MEDICAL CONDITION: (primary reason for [place]) MEDICAL NECESSITY: I certify, that based on my findings, NURSING services are a medically necessary home health service. HOME BOUND STATUS: I certify that my clinical findings support that this patient is homebound (i.e., Due to illness or injury, pt requires aid of supportive devices such as crutches, cane, wheelchairs, walkers, the use of special transportation or the assistance of another person to leave their place of residence. There is a normal inability to leave the home and doing so requires considerable and taxing effort. Other absences are for medical reasons / religious services and are infrequent or of short duration when for other reasons). If current dressing causes regression in wound condition, may D/C ordered dressing product/s and apply Normal Saline Moist Dressing daily until next [place] / Other MD appointment. [place] of regression in wound condition at [phone]. Please direct any NON-WOUND related issues/requests for orders to patient's Primary Care Physician I am going to recommend that we continue with the Current wound care measures specifically Betadine paints to the bilateral heels as well as the right great toe for the next week. I'm also gonna suggest Globoped offloading shoes be utilized when he is getting around in order to avoid pressure to the heels. I think this would be the single most helpful thing for him at this time as far as being able to  ambulate and get around any much more satisfactory method and way. They are going to check in with [facility] to see if this is something that can be ordered or if they happen to have them in stock. This could be ordered online but the patient really does not do anything online as far as ordering is concerned. Otherwise we will continue with the current wound care orders per above. Please see above for specific wound care orders. We will see patient for re-evaluation in 1 week(s) here in the clinic. If anything worsens or changes patient will contact our office for additional recommendations. Electronic Signature(s) Signed: 09/13/2017 6:07:32 PM By: Worthy Keeler PA-C Entered By: Worthy Keeler on 09/13/2017 13:50:37 Tomassi, Kevork L. ([id number]) -------------------------------------------------------------------------------- ROS/PFSH Details Patient Name: CUSTER, Levante L. Date of Service: 09/13/2017 12:45 PM Medical Record Number: [id number] Patient Account Number: [id number] Date of Birth/Sex: 28-Feb-1926 (84 y.o. Male) Treating RN: Montey Hora Primary Care Provider: [facility] Other Clinician: Referring Provider: [facility] Treating Provider/Extender: Melburn Hake, Willette Mudry Weeks in Treatment: 3 Information Obtained From Patient Wound History Do you currently have one or more open woundso Yes How many open wounds do you currently haveo 2 Approximately how long have you had your woundso 1 week How have you been treating your wound(s) until nowo nothing Has your wound(s) ever healed and then re-openedo No Have you had any lab work done in the past montho No Have you  tested positive for osteomyelitis (bone infection)o No Have you had any tests for circulation on your legso No Have you had other problems associated with your woundso Swelling Constitutional Symptoms (General Health) Complaints and Symptoms: Negative for: Fever; Chills Respiratory Complaints and Symptoms: No  Complaints or Symptoms Cardiovascular Complaints and Symptoms: No Complaints or Symptoms Medical History: Positive for: Arrhythmia - a-fib; Hypertension Oncologic Medical History: Positive for: Received Radiation Psychiatric Complaints and Symptoms: No Complaints or Symptoms Immunizations Pneumococcal Vaccine: Received Pneumococcal Vaccination: Yes Implantable Devices Family and Social History Molzahn, Suhaan L. ([id number]) Cancer: No; Diabetes: No; Heart Disease: No; Hereditary Spherocytosis: No; Hypertension: No; Kidney Disease: No; Lung Disease: No; Seizures: No; Stroke: No; Thyroid Problems: No; Tuberculosis: No; Former smoker; Marital Status - Married; Alcohol Use: Rarely - 2oz day; Drug Use: No History; Caffeine Use: Daily; Financial Concerns: No; Food, Clothing or Shelter Needs: No; Support System Lacking: No; Transportation Concerns: No; Advanced Directives: Yes (Not Provided); Patient does not want information on Advanced Directives; Do not resuscitate: No; Living Will: Yes (Not Provided); Medical Power of Attorney: No Physician Affirmation I have reviewed and agree with the above information. Electronic Signature(s) Signed: 09/13/2017 4:45:57 PM By: Montey Hora Signed: 09/13/2017 6:07:32 PM By: Worthy Keeler PA-C Entered By: Worthy Keeler on 09/13/2017 13:47:05 B[place]ley, Elfego L. ([id number]) -------------------------------------------------------------------------------- SuperBill Details Patient Name: HAROON, Hristopher L. Date of Service: 09/13/2017 Medical Record Number: [id number] Patient Account Number: [id number] Date of Birth/Sex: 01-Feb-1926 (84 y.o. Male) Treating RN: Montey Hora Primary Care Provider: [facility] Other Clinician: Referring Provider: [facility] Treating Provider/Extender: Melburn Hake, Rosina Cressler Weeks in Treatment: 3 Diagnosis Coding ICD-10 Codes Code Description L89.620 Pressure ulcer of left heel, unstageable L89.610 Pressure ulcer of right heel,  unstageable I48.2 Chronic atrial fibrillation I10 Essential (primary) hypertension Z79.01 Long term (current) use of anticoagulants Facility Procedures CPT4 Code: 38329191 Description: 99214 - WOUND CARE VISIT-LEV 4 EST PT Modifier: Quantity: 1 Physician Procedures CPT4 Code: 6606004 Description: 59977 - WC PHYS LEVEL 3 - EST PT ICD-10 Diagnosis Description L89.620 Pressure ulcer of left heel, unstageable L89.610 Pressure ulcer of right heel, unstageable I48.2 Chronic atrial fibrillation I10 Essential (primary) hypertension Modifier: Quantity: 1 Electronic Signature(s) Signed: 09/13/2017 6:07:32 PM By: Worthy Keeler PA-C Entered By: Worthy Keeler on 09/13/2017 13:51:05

## 2017-09-19 ENCOUNTER — Other Ambulatory Visit: Payer: Self-pay

## 2017-09-19 DIAGNOSIS — L89620 Pressure ulcer of left heel, unstageable: Secondary | ICD-10-CM | POA: Diagnosis not present

## 2017-09-19 DIAGNOSIS — S51812D Laceration without foreign body of left forearm, subsequent encounter: Secondary | ICD-10-CM | POA: Diagnosis not present

## 2017-09-19 DIAGNOSIS — S32511D Fracture of superior rim of right pubis, subsequent encounter for fracture with routine healing: Secondary | ICD-10-CM | POA: Diagnosis not present

## 2017-09-19 DIAGNOSIS — L89610 Pressure ulcer of right heel, unstageable: Secondary | ICD-10-CM | POA: Diagnosis not present

## 2017-09-19 NOTE — Progress Notes (Signed)
Tindall, Young L. ([id number]) Visit Report for 09/13/2017 Arrival Information Details Patient Name: [name], [name] Date of Service: 09/13/2017 12:45 PM Medical Record Number: [id number] Patient Account Number: [id number] Date of Birth/Sex: 1925/12/19 (84 y.o. Male) Treating RN: [name] Primary Care [name]: [name] Other Clinician: Referring [name]: [name] Treating [name]/Extender: [name], [name] in Treatment: 3 Visit Information History Since Last Visit All ordered tests and consults were completed: No Patient Arrived: Wheel Chair Added or deleted any medications: No Arrival Time: 12:53 Any new allergies or adverse reactions: No Accompanied By: wife Had a fall or experienced change in No Transfer Assistance: None activities of daily living that may affect Patient Identification Verified: Yes risk of falls: Secondary Verification Process Yes Signs or symptoms of abuse/neglect since last visito No Completed: Hospitalized since last visit: No Patient Requires Transmission-Based No Pain Present Now: Yes Precautions: Patient Has Alerts: Yes Patient Alerts: Patient on Blood Thinner Warfarin Electronic Signature(s) Signed: 09/18/2017 8:28:46 AM By: [name] Entered By: [name] on 09/13/2017 12:57:07 Holaway, Sandon L. ([id number]) -------------------------------------------------------------------------------- Clinic Level of Care Assessment Details Patient Name: [name], [name] Date of Service: 09/13/2017 12:45 PM Medical Record Number: [id number] Patient Account Number: [id number] Date of Birth/Sex: 11/06/1925 (84 y.o. Male) Treating RN: [name] Primary Care [name]: [name] Other Clinician: Referring [name]: [name] Treating [name]/Extender: [name], [name] in Treatment: 3 Clinic Level of Care Assessment Items TOOL 4 Quantity Score []  - Use when only an EandM is performed on FOLLOW-UP visit 0 ASSESSMENTS  - Nursing Assessment / Reassessment X - Reassessment of Co-morbidities (includes updates in patient status) 1 10 X- 1 5 Reassessment of Adherence to Treatment Plan ASSESSMENTS - Wound and Skin Assessment / Reassessment []  - Simple Wound Assessment / Reassessment - one wound 0 X- 3 5 Complex Wound Assessment / Reassessment - multiple wounds []  - 0 Dermatologic / Skin Assessment (not related to wound area) ASSESSMENTS - Focused Assessment []  - Circumferential Edema Measurements - multi extremities 0 []  - 0 Nutritional Assessment / Counseling / Intervention X- 1 5 Lower Extremity Assessment (monofilament, tuning fork, pulses) []  - 0 Peripheral Arterial Disease Assessment (using hand held doppler) ASSESSMENTS - Ostomy and/or Continence Assessment and Care []  - Incontinence Assessment and Management 0 []  - 0 Ostomy Care Assessment and Management (repouching, etc.) PROCESS - Coordination of Care X - Simple Patient / Family Education for ongoing care 1 15 []  - 0 Complex (extensive) Patient / Family Education for ongoing care []  - 0 Staff obtains Programmer, systems, Records, Test Results / Process Orders []  - 0 Staff telephones HHA, Nursing Homes / Clarify orders / etc []  - 0 Routine Transfer to another Facility (non-emergent condition) []  - 0 Routine Hospital Admission (non-emergent condition) []  - 0 New Admissions / Biomedical engineer / Ordering NPWT, Apligraf, etc. []  - 0 Emergency Hospital Admission (emergent condition) X- 1 10 Simple Discharge Coordination Jesus, Montrelle L. ([id number]) []  - 0 Complex (extensive) Discharge Coordination PROCESS - Special Needs []  - Pediatric / Minor Patient Management 0 []  - 0 Isolation Patient Management []  - 0 Hearing / Language / Visual special needs []  - 0 Assessment of Community assistance (transportation, D/C planning, etc.) []  - 0 Additional assistance / Altered mentation []  - 0 Support Surface(s) Assessment (bed, cushion, seat,  etc.) INTERVENTIONS - Wound Cleansing / Measurement []  - Simple Wound Cleansing - one wound 0 X- 3 5 Complex Wound Cleansing - multiple wounds X- 1 5 Wound Imaging (photographs - any  number of wounds) []  - 0 Wound Tracing (instead of photographs) []  - 0 Simple Wound Measurement - one wound X- 3 5 Complex Wound Measurement - multiple wounds INTERVENTIONS - Wound Dressings X - Small Wound Dressing one or multiple wounds 3 10 []  - 0 Medium Wound Dressing one or multiple wounds []  - 0 Large Wound Dressing one or multiple wounds X- 1 5 Application of Medications - topical []  - 0 Application of Medications - injection INTERVENTIONS - Miscellaneous []  - External ear exam 0 []  - 0 Specimen Collection (cultures, biopsies, blood, body fluids, etc.) []  - 0 Specimen(s) / Culture(s) sent or taken to [name] for analysis []  - 0 Patient Transfer (multiple staff / Civil Service fast streamer / Similar devices) []  - 0 Simple Staple / Suture removal (25 or less) []  - 0 Complex Staple / Suture removal (26 or more) []  - 0 Hypo / Hyperglycemic Management (close monitor of Blood Glucose) []  - 0 Ankle / Brachial Index (ABI) - do not check if billed separately X- 1 5 Vital Signs [name], [name] ([id number]) Has [name] patient been seen at [name] hospital within [name] last three years: Yes Total Score: 135 Level Of Care: New/Established - Level 4 Electronic Signature(s) Signed: 09/13/2017 4:45:57 PM By: [name] Entered By: [name] on 09/13/2017 13:29:39 [name], [name] ([id number]) -------------------------------------------------------------------------------- Encounter Discharge Information Details Patient Name: [name], [name] Date of Service: 09/13/2017 12:45 PM Medical Record Number: [id number] Patient Account Number: [id number] Date of Birth/Sex: [birth year] (84 y.o. Male) Treating RN: [name] Primary Care Roverto Bodmer: [name] Other Clinician: Referring Kiannah Grunow: [name] Treating  Verle Wheeling/Extender: [name], [name] in Treatment: 3 Encounter Discharge Information Items Discharge Pain Level: 0 Discharge Condition: Stable Ambulatory Status: Walker Discharge Destination: Home Transportation: Private Auto Accompanied By: wife Schedule Follow-up Appointment: Yes Medication Reconciliation completed and Yes provided to Patient/Care Jeffery Gammell: Provided on Clinical Summary of Care: 09/13/2017 Form Type Recipient Paper Patient [facility] Electronic Signature(s) Signed: 09/13/2017 5:16:29 PM By: Gretta Cool, BSN, RN, CWS, Kim RN, BSN Entered By: Gretta Cool, BSN, RN, CWS, Kim on 09/13/2017 13:47:04 Collier, Arlon L. ([id number]) -------------------------------------------------------------------------------- Lower Extremity Assessment Details Patient Name: [name], [name] Date of Service: 09/13/2017 12:45 PM Medical Record Number: [id number] Patient Account Number: [id number] Date of Birth/Sex: 1925/09/17 (84 y.o. Male) Treating RN: [name] Primary Care Dairon Procter: [name] Other Clinician: Referring Perri Aragones: [name] Treating Siri Buege/Extender: [name], [name] in Treatment: 3 Edema Assessment Assessed: [Left: No] [Right: No] Edema: [Left: No] [Right: No] Vascular Assessment Claudication: Claudication Assessment [Left:None] [Right:None] Pulses: Dorsalis Pedis Palpable: [Left:Yes] [Right:Yes] Posterior Tibial Extremity colors, hair growth, and conditions: Extremity Color: [Left:Normal] [Right:Normal] Temperature of Extremity: [Right:Warm] Capillary Refill: [Left:< 3 seconds] [Right:< 3 seconds] Toe Nail Assessment Left: Right: Thick: No No Discolored: No No Deformed: No No Improper Length and Hygiene: No No Electronic Signature(s) Signed: 09/18/2017 8:28:46 AM By: [name] Entered By: [name] on 09/13/2017 13:08:23 Cuneo, Derris L. ([id number]) -------------------------------------------------------------------------------- Multi Wound  Chart Details Patient Name: [name], [name] Date of Service: 09/13/2017 12:45 PM Medical Record Number: [id number] Patient Account Number: [id number] Date of Birth/Sex: 04/02/1926 (84 y.o. Male) Treating RN: [name] Primary Care Anthony Tamburo: [name] Other Clinician: Referring Jiovanny Burdell: [name] Treating Juana Haralson/Extender: [name], [name] in Treatment: 3 Vital Signs Height(in): 70 Pulse(bpm): 81 Weight(lbs): 190 Blood Pressure(mmHg): 100/48 Body Mass Index(BMI): 27 Temperature(F): 97.5 Respiratory Rate 16 (breaths/min): Photos: [1:No Photos] [2:No Photos] [3:No Photos] Wound Location: [1:Left Calcaneus] [2:Right Calcaneus] [3:Toe Great] Wounding Event: [1:Pressure  Injury] [2:Pressure Injury] [3:Pressure Injury] Primary Etiology: [1:Pressure Ulcer] [2:Pressure Ulcer] [3:Pressure Ulcer] Comorbid History: [1:N/A] [2:Arrhythmia, Hypertension, Received Radiation] [3:Arrhythmia, Hypertension, Received Radiation] Date Acquired: [1:08/12/2017] [2:08/12/2017] [3:09/02/2017] [name] of Treatment: [1:3] [2:3] [3:0] Wound Status: [1:Open] [2:Open] [3:Open] Measurements L x W x D [1:5.5x4.2x0.2] [2:3.6x3.1x0.2] [3:0.5x0.5x0.1] (cm) Area (cm) : [1:18.143] [2:8.765] [3:0.196] Volume (cm) : [1:3.629] [2:1.753] [3:0.02] % Reduction in Area: [1:20.70%] [2:23.70%] [3:0.00%] % Reduction in Volume: [1:20.70%] [2:23.70%] [3:0.00%] Classification: [1:Unstageable/Unclassified] [2:Unstageable/Unclassified] [3:Category/Stage II] Exudate Amount: [1:N/A] [2:Large] [3:Medium] Exudate Type: [1:N/A] [2:Sanguinous] [3:Serosanguineous] Exudate Color: [1:N/A] [2:red] [3:red, brown] Wound Margin: [1:N/A] [2:Thickened] [3:Flat and Intact] Granulation Amount: [1:N/A] [2:None Present (0%)] [3:None Present (0%)] Necrotic Amount: [1:N/A] [2:Large (67-100%)] [3:Large (67-100%)] Necrotic Tissue: [1:N/A] [2:Eschar] [3:Eschar, Adherent Slough] Epithelialization: [1:N/A] [2:None] [3:Medium  (34-66%)] Periwound Skin Texture: [1:No Abnormalities Noted] [2:No Abnormalities Noted] [3:Excoriation: No Induration: No Callus: No Crepitus: No Rash: No Scarring: No] Periwound Skin Moisture: [1:No Abnormalities Noted] [2:No Abnormalities Noted] [3:Maceration: No Dry/Scaly: No] Periwound Skin Color: [1:No Abnormalities Noted] [2:No Abnormalities Noted] [3:Atrophie Blanche: No Cyanosis: No Ecchymosis: No Erythema: No Hemosiderin Staining: No] Mottled: No Pallor: No Rubor: No Temperature: N/A No Abnormality No Abnormality Tenderness on Palpation: No Yes No Wound Preparation: N/A Ulcer Cleansing: Ulcer Cleansing: Rinsed/Irrigated with Saline Rinsed/Irrigated with Saline Topical Anesthetic Applied: Topical Anesthetic Applied: Other: lidocaine 4% Other: lidocAINE 4% Treatment Notes Electronic Signature(s) Signed: 09/13/2017 4:45:57 PM By: [name] Entered By: [name] on 09/13/2017 13:16:15 [name], [name] ([id number]) -------------------------------------------------------------------------------- [place] Details Patient Name: [name], [name] Date of Service: 09/13/2017 12:45 PM Medical Record Number: [id number] Patient Account Number: [id number] Date of Birth/Sex: 1926-07-05 (84 y.o. Male) Treating RN: [name] Primary Care Amil Bouwman: [name] Other Clinician: Referring Maleea Camilo: [name] Treating Dylon Correa/Extender: [name], [name] in Treatment: 3 Active Inactive ` Abuse / Safety / Falls / Self Care Management Nursing Diagnoses: History of Falls Potential for falls Goals: Patient will not experience any injury related to falls Date Initiated: 08/19/2017 Target Resolution Date: 12/14/2017 Goal Status: Active Interventions: Assess Activities of Daily Living upon admission and as needed Assess fall risk on admission and as needed Assess: immobility, friction, shearing, incontinence upon admission and as needed Assess impairment  of mobility on admission and as needed per policy Assess personal safety and home safety (as indicated) on admission and as needed Assess self care needs on admission and as needed Notes: ` Nutrition Nursing Diagnoses: Imbalanced nutrition Potential for alteratiion in Nutrition/Potential for imbalanced nutrition Goals: Patient/caregiver agrees to and verbalizes understanding of need to use nutritional supplements and/or vitamins as prescribed Date Initiated: 08/19/2017 Target Resolution Date: 11/16/2017 Goal Status: Active Interventions: Assess patient nutrition upon admission and as needed per policy Notes: ` Orientation to [name] Nursing Diagnoses: Griess, Mcgwire L. ([id number]) Knowledge deficit related to [name] Goals: Patient/caregiver will verbalize understanding of [name] [place] Date Initiated: 08/19/2017 Target Resolution Date: 09/14/2017 Goal Status: Active Interventions: Provide education on orientation to [name] Notes: ` Pain, Acute or Chronic Nursing Diagnoses: Pain, acute or chronic: actual or potential Potential alteration in comfort, pain Goals: Patient/caregiver will verbalize adequate pain control between visits Date Initiated: 08/19/2017 Target Resolution Date: 12/14/2017 Goal Status: Active Interventions: Complete pain assessment as per visit requirements Notes: ` Pressure Nursing Diagnoses: Knowledge deficit related to causes and risk factors for pressure ulcer development Knowledge deficit related to management of pressures ulcers Potential for impaired tissue integrity related to pressure, friction, moisture, and  shear Goals: Patient will remain free from development of additional pressure ulcers Date Initiated: 08/19/2017 Target Resolution Date: 12/14/2017 Goal Status: Active Interventions: Assess: immobility, friction, shearing, incontinence upon admission and as needed Assess  potential for pressure ulcer upon admission and as needed Notes: ` Wound/Skin Impairment Nursing Diagnoses: Impaired tissue integrity Knowledge deficit related to ulceration/compromised skin integrity [name], [name] ([id number]) Goals: Ulcer/skin breakdown will have a volume reduction of 80% by week 12 Date Initiated: 08/19/2017 Target Resolution Date: 12/14/2017 Goal Status: Active Interventions: Assess patient/caregiver ability to perform ulcer/skin care regimen upon admission and as needed Assess ulceration(s) every visit Notes: Electronic Signature(s) Signed: 09/13/2017 4:45:57 PM By: [name] Entered By: [name] on 09/13/2017 13:16:00 [name], [name] L. ([id number]) -------------------------------------------------------------------------------- Pain Assessment Details Patient Name: [name], [name] Date of Service: 09/13/2017 12:45 PM Medical Record Number: [id number] Patient Account Number: [id number] Date of Birth/Sex: 1926-06-08 (84 y.o. Male) Treating RN: [name] Primary Care Shalene Gallen: [name] Other Clinician: Referring Dariel Betzer: [name] Treating Suleiman Finigan/Extender: [name], [name] in Treatment: 3 Active Problems Location of Pain Severity and Description of Pain Patient Has Paino Patient Unable to Respond Site Locations Duration of [name] Pain. Constant / Intermittento Intermittent Rate [name] pain. Current Pain Level: 3 Character of Pain Describe [name] Pain: Dull Pain Management and Medication Current Pain Management: Electronic Signature(s) Signed: 09/18/2017 8:28:46 AM By: [name] Entered By: [name] on 09/13/2017 12:57:20 Coard, Merrit L. ([id number]) -------------------------------------------------------------------------------- Patient/Caregiver Education Details Patient Name: MARQUI, FORMBY L. Date of Service: 09/13/2017 12:45 PM Medical Record Number: [id number] Patient Account Number: [id number] Date of  Birth/Gender: March 15, 1926 (84 y.o. Male) Treating RN: Cornell Barman Primary Care Physician: [name] Other Clinician: Referring Physician: Ramonita [name] Treating Physician/Extender: Worthy Keeler [name] in Treatment: 3 Education Assessment Education Provided To: Patient and Caregiver Education Topics Provided Pressure: Handouts: Pressure Ulcers: Care and Offloading, Other: Prescription given for pressure relief shoes Methods: Demonstration, Explain/Verbal Responses: State content correctly Electronic Signature(s) Signed: 09/13/2017 5:16:29 PM By: Gretta Cool, BSN, RN, CWS, Kim RN, BSN Entered By: Gretta Cool, BSN, RN, CWS, Kim on 09/13/2017 13:48:08 Mohamud, Lathyn L. ([id number]) -------------------------------------------------------------------------------- Wound Assessment Details Patient Name: [name], [name] Date of Service: 09/13/2017 12:45 PM Medical Record Number: [id number] Patient Account Number: [id number] Date of Birth/Sex: 1925/10/02 (84 y.o. Male) Treating RN: [name] Primary Care Beckett Hickmon: [name] Other Clinician: Referring Yarexi Pawlicki: [name] Treating Audree Schrecengost/Extender: [name], [name] in Treatment: 3 Wound Status Wound Number: 1 Primary Etiology: Pressure Ulcer Wound Location: Left Calcaneus Wound Status: Open Wounding Event: Pressure Injury Date Acquired: 08/12/2017 [name] Of Treatment: 3 Clustered Wound: No Photos Photo Uploaded By: [name] on 09/13/2017 16:08:31 Wound Measurements Length: (cm) 5.5 Width: (cm) 4.2 Depth: (cm) 0.2 Area: (cm) 18.143 Volume: (cm) 3.629 % Reduction in Area: 20.7% % Reduction in Volume: 20.7% Wound Description Classification: Unstageable/Unclassified Periwound Skin Texture Texture Color No Abnormalities Noted: No No Abnormalities Noted: No Moisture No Abnormalities Noted: No Treatment Notes Wound #1 (Left Calcaneus) 1. Cleansed with: Clean wound with Normal Saline 4. Dressing Applied: Other dressing  (specify in notes) 7. Secured with Tibbs, Kellon L. ([id number]) Tubular bandage Notes Betadine, Heel Cup and Stretch Net to Secure Electronic Signature(s) Signed: 09/18/2017 8:28:46 AM By: [name] Entered By: [name] on 09/13/2017 13:04:01 Tucholski, Charlotte ([id number]) -------------------------------------------------------------------------------- Wound Assessment Details Patient Name: [name], [name] Date of Service: 09/13/2017 12:45 PM Medical Record Number: [id number] Patient Account Number: [id number] Date of Birth/Sex: 01-29-1926 (84 y.o. Male) Treating RN: [name]  Primary Care Kathlynn Swofford: [name] Other Clinician: Referring Ildefonso Keaney: [name] Treating Michio Thier/Extender: [name], [name] in Treatment: 3 Wound Status Wound Number: 2 Primary Etiology: Pressure Ulcer Wound Location: Right Calcaneus Wound Status: Open Wounding Event: Pressure Injury Comorbid Arrhythmia, Hypertension, Received History: Radiation Date Acquired: 08/12/2017 [name] Of Treatment: 3 Clustered Wound: No Photos Photo Uploaded By: [name] on 09/13/2017 16:08:31 Wound Measurements Length: (cm) 3.6 Width: (cm) 3.1 Depth: (cm) 0.2 Area: (cm) 8.765 Volume: (cm) 1.753 % Reduction in Area: 23.7% % Reduction in Volume: 23.7% Epithelialization: None Tunneling: No Undermining: No Wound Description Classification: Unstageable/Unclassified Wound Margin: Thickened Exudate Amount: Large Exudate Type: Sanguinous Exudate Color: red Foul Odor After Cleansing: No Slough/Fibrino Yes Wound Bed Granulation Amount: None Present (0%) Exposed Structure Necrotic Amount: Large (67-100%) Fascia Exposed: No Necrotic Quality: Eschar Fat Layer (Subcutaneous Tissue) Exposed: No Tendon Exposed: No Muscle Exposed: No Joint Exposed: No Bone Exposed: No Periwound Skin Texture Kotter, Tilman L. ([id number]) Texture Color No Abnormalities Noted: No No Abnormalities  Noted: No Moisture Temperature / Pain No Abnormalities Noted: No Temperature: No Abnormality Tenderness on Palpation: Yes Wound Preparation Ulcer Cleansing: Rinsed/Irrigated with Saline Topical Anesthetic Applied: Other: lidocaine 4%, Treatment Notes Wound #2 (Right Calcaneus) 1. Cleansed with: Clean wound with Normal Saline 4. Dressing Applied: Other dressing (specify in notes) 7. Secured with Tubular bandage Notes Betadine, Heel Cup and Stretch Net to [facility]) Signed: 09/18/2017 8:28:46 AM By: [name] Entered By: [name] on 09/13/2017 13:06:58 Muench, Zacchaeus L. ([id number]) -------------------------------------------------------------------------------- Wound Assessment Details Patient Name: [name], [name] Date of Service: 09/13/2017 12:45 PM Medical Record Number: [id number] Patient Account Number: [id number] Date of Birth/Sex: Jun 07, 1926 (84 y.o. Male) Treating RN: [name] Primary Care Kai Railsback: [name] Other Clinician: Referring Litha Lamartina: [name] Treating Zaydenn Balaguer/Extender: [name], [name] in Treatment: 3 Wound Status Wound Number: 3 Primary Etiology: Pressure Ulcer Wound Location: Toe Great Wound Status: Open Wounding Event: Pressure Injury Comorbid Arrhythmia, Hypertension, Received History: Radiation Date Acquired: 09/02/2017 [name] Of Treatment: 0 Clustered Wound: No Photos Photo Uploaded By: [name] on 09/13/2017 16:08:53 Wound Measurements Length: (cm) 0.5 Width: (cm) 0.5 Depth: (cm) 0.1 Area: (cm) 0.196 Volume: (cm) 0.02 % Reduction in Area: 0% % Reduction in Volume: 0% Epithelialization: Medium (34-66%) Tunneling: No Undermining: No Wound Description Classification: Category/Stage II Wound Margin: Flat and Intact Exudate Amount: Medium Exudate Type: Serosanguineous Exudate Color: red, brown Foul Odor After Cleansing: No Slough/Fibrino Yes Wound Bed Granulation Amount:  None Present (0%) Exposed Structure Necrotic Amount: Large (67-100%) Fascia Exposed: No Necrotic Quality: Eschar, Adherent Slough Fat Layer (Subcutaneous Tissue) Exposed: No Tendon Exposed: No Muscle Exposed: No Joint Exposed: No Bone Exposed: No Periwound Skin Texture Andrew, Joffre L. ([id number]) Texture Color No Abnormalities Noted: No No Abnormalities Noted: No Callus: No Atrophie Blanche: No Crepitus: No Cyanosis: No Excoriation: No Ecchymosis: No Induration: No Erythema: No Rash: No Hemosiderin Staining: No Scarring: No Mottled: No Pallor: No Moisture Rubor: No No Abnormalities Noted: No Dry / Scaly: No Temperature / Pain Maceration: No Temperature: No Abnormality Wound Preparation Ulcer Cleansing: Rinsed/Irrigated with Saline Topical Anesthetic Applied: Other: lidocAINE 4%, Treatment Notes Wound #3 (Toe Great) 1. Cleansed with: Clean wound with Normal Saline 4. Dressing Applied: Other dressing (specify in notes) 7. Secured with Tubular bandage Notes Betadine, Heel Cup and Stretch Net to [facility]) Signed: 09/18/2017 8:28:46 AM By: [name] Entered By: [name] on 09/13/2017 13:07:41 Cain, [place] ([id number]) -------------------------------------------------------------------------------- Vitals Details Patient Name: GWENDOLYN, NISHI  L. Date of Service: 09/13/2017 12:45 PM Medical Record Number: [id number] Patient Account Number: [id number] Date of Birth/Sex: Feb 04, 1926 (84 y.o. Male) Treating RN: [name] Primary Care Kordel Leavy: [name] Other Clinician: Referring Natalina Wieting: [name] Treating Rick Warnick/Extender: [name], [name] in Treatment: 3 Vital Signs Time Taken: 12:57 Temperature (F): 97.5 Height (in): 70 Pulse (bpm): 81 Weight (lbs): 190 Respiratory Rate (breaths/min): 16 Body Mass Index (BMI): 27.3 Blood Pressure (mmHg): 100/48 Reference Range: 80 - 120 mg / dl Electronic  Signature(s) Signed: 09/18/2017 8:28:46 AM By: [name] Entered By: [name] on 09/13/2017 12:57:41

## 2017-09-19 NOTE — Patient Outreach (Signed)
Transition of care: Placed call to patient and spoke with wife. Mrs. Mehrer reports that patient is doing well. He continues to have pain with his heels. Reports shoes have been order for off loading pressure. Mrs. Tallman reports dressing changes are being done at home for the next 30 days and then patient returns to the wound clinic. Mrs. Kitchings reports patient does a lot of sitting due to pain in his feet.  Patient remains active with [facility] for home health nursing and PT and OT.  Therapy was done today.   Wife denies any new concerns or assistance needed today.  PLAN: Patient will continue to be followed for transition of care by primary [facility] care manager.  Tomasa Rand, RN, BSN, CEN [facility] [employer] [phone]

## 2017-09-27 ENCOUNTER — Other Ambulatory Visit: Payer: Self-pay | Admitting: *Deleted

## 2017-09-27 NOTE — Patient Outreach (Signed)
[facility]) Care Management  09/27/2017  [name] 13-Oct-1925 [id number]  Transition of care call   Successful outreach call to patient , able to speak with his wife [name], HiPPA verified. Wife reports patient is doing well, still complains of discomfort in heel when walking. Patient now has the offloading shoes, and reports that is helping some. Pain in hip area has improved.  Wife reports patient is able to walk a little more in the home with therapy, they still have difficulty getting down the steps, so they are having a ramp built, through there church, hopefully in the next 2 weeks.  Mrs.Capek discussed recent visit to wound clinic and daily dressing changes, she paints sites with betadine and redresses twice daily, home health RN visit once week. They are anticipating a home health physical therapy visit on today.  Patient still sleeping in the recliner chair but, plans to try and sleep in bed tonight wife has prevalon boot for patient to wear.   Wife denies any new concerns or assistance on today. Patient has follow up wound clinic visit on 4/5, hoping ramp will be in place at that time.  Wife discussed she has rescheduled PCP visit at least twice due to patient  limited mobility with getting down steps and plan to reschedule.   Patient has completed transition of care period will continue to follow for complex care management.    Plan Will plan follow up call in the month.  Joylene Draft, RN, [place] Management Coordinator  [phone]- Mobile [phone]- Toll Free Main Office

## 2017-10-11 ENCOUNTER — Encounter: Payer: PPO | Attending: Physician Assistant | Admitting: Physician Assistant

## 2017-10-11 DIAGNOSIS — I482 Chronic atrial fibrillation: Secondary | ICD-10-CM | POA: Insufficient documentation

## 2017-10-11 DIAGNOSIS — Z7901 Long term (current) use of anticoagulants: Secondary | ICD-10-CM | POA: Insufficient documentation

## 2017-10-11 DIAGNOSIS — Z87891 Personal history of nicotine dependence: Secondary | ICD-10-CM | POA: Diagnosis not present

## 2017-10-11 DIAGNOSIS — L89620 Pressure ulcer of left heel, unstageable: Secondary | ICD-10-CM | POA: Diagnosis not present

## 2017-10-11 DIAGNOSIS — L89610 Pressure ulcer of right heel, unstageable: Secondary | ICD-10-CM | POA: Diagnosis not present

## 2017-10-11 DIAGNOSIS — I10 Essential (primary) hypertension: Secondary | ICD-10-CM | POA: Insufficient documentation

## 2017-10-11 DIAGNOSIS — L89890 Pressure ulcer of other site, unstageable: Secondary | ICD-10-CM | POA: Diagnosis not present

## 2017-10-14 ENCOUNTER — Encounter (INDEPENDENT_AMBULATORY_CARE_PROVIDER_SITE_OTHER): Payer: PPO

## 2017-10-14 ENCOUNTER — Ambulatory Visit (INDEPENDENT_AMBULATORY_CARE_PROVIDER_SITE_OTHER): Payer: PPO | Admitting: Vascular Surgery

## 2017-10-15 NOTE — Progress Notes (Signed)
[name], [name] ([id number]) Visit Report for 10/11/2017 Arrival Information Details Patient Name: [name], [name] Date of Service: 10/11/2017 1:00 PM Medical Record Number: [id number] Patient Account Number: [id number] Date of Birth/Sex: May 06, 1926 (84 y.o. M) Treating RN: [name] Primary Care [name]: [name] Other Clinician: Referring [name]: [name] Treating [name]/Extender: [name], [name] [name]: 7 Visit Information History Since Last Visit All ordered tests and consults were completed: No Patient Arrived: Wheel Chair Added or deleted any medications: No Arrival Time: 12:57 Any new allergies or adverse reactions: No Accompanied By: spouse Had a fall or experienced change in No Transfer Assistance: EasyPivot Patient activities of daily living that may affect Lift risk of falls: Patient Identification Verified: Yes Signs or symptoms of abuse/neglect since last visito No Secondary Verification Process Yes Hospitalized since last visit: No Completed: Implantable device outside of the clinic excluding No Patient Requires Transmission-Based No cellular tissue based products placed in the center Precautions: since last visit: Patient Has Alerts: Yes Has Dressing in Place as Prescribed: Yes Patient Alerts: Patient on Blood Pain Present Now: Yes Thinner Warfarin Electronic Signature(s) Signed: 10/14/2017 4:32:12 PM By: [name] Entered By: [name] on 10/11/2017 12:59:30 Martorana, [name] ([id number]) -------------------------------------------------------------------------------- Encounter Discharge Information Details Patient Name: [name], [name] Date of Service: 10/11/2017 1:00 PM Medical Record Number: [id number] Patient Account Number: [id number] Date of Birth/Sex: 1926/04/29 (84 y.o. M) Treating RN: Montey Hora Primary Care Syvanna Ciolino: [name] Other Clinician: Referring Jaydrian Corpening: [name] Treating  Story Vanvranken/Extender: [name], [name] [name]: 7 Encounter Discharge Information Items Discharge Pain Level: 0 Discharge Condition: Stable Ambulatory Status: Wheelchair Discharge Destination: Home Transportation: Private Auto Accompanied By: spouse Schedule Follow-up Appointment: Yes Medication Reconciliation completed and No provided to Patient/Care Marra Fraga: Provided on Clinical Summary of Care: 10/11/2017 Form Type Recipient Paper Patient [facility] Electronic Signature(s) Signed: 10/14/2017 4:32:12 PM By: [name] Entered By: [name] on 10/11/2017 13:51:01 Kindig, [name] ([id number]) -------------------------------------------------------------------------------- Lower Extremity Assessment Details Patient Name: [name], [name] Date of Service: 10/11/2017 1:00 PM Medical Record Number: [id number] Patient Account Number: [id number] Date of Birth/Sex: September 06, 1925 (84 y.o. M) Treating RN: [name] Primary Care Cainan Trull: [name] Other Clinician: Referring Rowe Warman: [name] Treating Maliya Marich/Extender: [name], [name] [name]: 7 Vascular Assessment Pulses: Dorsalis Pedis Palpable: [Left:Yes] [Right:Yes] Posterior Tibial Extremity colors, hair growth, and conditions: Extremity Color: [Left:Normal] [Right:Normal] Temperature of Extremity: [Left:Warm] [Right:Warm] Capillary Refill: [Left:< 3 seconds] [Right:< 3 seconds] Toe Nail Assessment Left: Right: Thick: Yes Yes Discolored: Yes Yes Deformed: Yes Yes Improper Length and Hygiene: Yes Yes Electronic Signature(s) Signed: 10/14/2017 4:32:12 PM By: [name] Entered By: [name] on 10/11/2017 13:16:11 [name] ([id number]) -------------------------------------------------------------------------------- Multi Wound Chart Details Patient Name: [name], [name] Date of Service: 10/11/2017 1:00 PM Medical Record Number: [id number] Patient Account Number:  [id number] Date of Birth/Sex: 04/22/1926 (84 y.o. M) Treating RN: Montey Hora Primary Care Donavon Kimrey: [name] Other Clinician: Referring Teigen Parslow: [name] Treating Jadon Harbaugh/Extender: [name], [name] [name]: 7 Vital Signs Height(in): 70 Pulse(bpm): 70 Weight(lbs): 190 Blood Pressure(mmHg): 120/72 Body Mass Index(BMI): 27 Temperature(F): Respiratory Rate 16 (breaths/min): Photos: [1:No Photos] [2:No Photos] [3:No Photos] Wound Location: [1:Left Calcaneus] [2:Right Calcaneus] [3:Toe Great] Wounding Event: [1:Pressure Injury] [2:Pressure Injury] [3:Pressure Injury] Primary Etiology: [1:Pressure Ulcer] [2:Pressure Ulcer] [3:Pressure Ulcer] Comorbid History: [1:Arrhythmia, Hypertension, Received Radiation] [2:Arrhythmia, Hypertension, Received Radiation] [3:Arrhythmia, Hypertension, Received Radiation] Date Acquired: [1:08/12/2017] [2:08/12/2017] [3:09/02/2017] [name] of [name]: [1:7] [2:7] [3:4] Wound Status: [1:Open] [2:Open] [3:Open]  Measurements L x W x D [1:4.2x4.5x0.2] [2:2.5x3.3x0.2] [3:0.2x0.2x0.1] (cm) Area (cm) : [1:14.844] [2:6.48] [3:0.031] Volume (cm) : [1:2.969] [2:1.296] [3:0.003] % Reduction in Area: [1:35.10%] [2:43.60%] [3:84.20%] % Reduction in Volume: [1:35.10%] [2:43.60%] [3:85.00%] Classification: [1:Unstageable/Unclassified] [2:Unstageable/Unclassified] [3:Category/Stage II] Exudate Amount: [1:Large] [2:Large] [3:Small] Exudate Type: [1:Serous] [2:Serous] [3:Serosanguineous] Exudate Color: [1:amber] [2:amber] [3:red, brown] Wound Margin: [1:Distinct, outline attached] [2:Thickened] [3:Flat and Intact] Granulation Amount: [1:None Present (0%)] [2:None Present (0%)] [3:None Present (0%)] Necrotic Amount: [1:Large (67-100%)] [2:Large (67-100%)] [3:Large (67-100%)] Necrotic Tissue: [1:Eschar] [2:Eschar] [3:Eschar, Adherent Slough] Exposed Structures: [1:Fascia: No Fat Layer (Subcutaneous Tissue) Exposed: No Tendon: No Muscle: No Joint: No  Bone: No] [2:Fascia: No Fat Layer (Subcutaneous Tissue) Exposed: No Tendon: No Muscle: No Joint: No Bone: No] [3:Fascia: No Fat Layer (Subcutaneous Tissue) Exposed:  No Tendon: No Muscle: No Joint: No Bone: No] Epithelialization: [1:None] [2:None] [3:Medium (34-66%)] Periwound Skin Texture: [1:No Abnormalities Noted] [2:No Abnormalities Noted] [3:Excoriation: No Induration: No Callus: No Crepitus: No Rash: No Scarring: No] Periwound Skin Moisture: Dry/Scaly: Yes Dry/Scaly: Yes Maceration: No Dry/Scaly: No Periwound Skin Color: No Abnormalities Noted No Abnormalities Noted Atrophie Blanche: No Cyanosis: No Ecchymosis: No Erythema: No Hemosiderin Staining: No Mottled: No Pallor: No Rubor: No Temperature: No Abnormality No Abnormality No Abnormality Tenderness on Palpation: Yes Yes No Wound Preparation: Ulcer Cleansing: Ulcer Cleansing: Ulcer Cleansing: Rinsed/Irrigated with Saline Rinsed/Irrigated with Saline Rinsed/Irrigated with Saline Topical Anesthetic Applied: Topical Anesthetic Applied: Topical Anesthetic Applied: Other: lidocaine 4% Other: lidocaine 4% Other: lidocAINE 4% [name] Notes Electronic Signature(s) Signed: 10/11/2017 3:34:53 PM By: Montey Hora Entered By: Montey Hora on 10/11/2017 13:23:30 Steen, Fergus L. ([id number]) -------------------------------------------------------------------------------- [place] Details Patient Name: [name], LENDERMAN. Date of Service: 10/11/2017 1:00 PM Medical Record Number: [id number] Patient Account Number: [id number] Date of Birth/Sex: 1926-05-05 (84 y.o. M) Treating RN: Montey Hora Primary Care Pradyun Ishman: [name] Other Clinician: Referring Daveon Arpino: [name] Treating Mahogani Holohan/Extender: [name], [name] [name]: 7 Active Inactive ` Abuse / Safety / Falls / Self Care Management Nursing Diagnoses: History of Falls Potential for falls Goals: Patient will not experience any injury  related to falls Date Initiated: 08/19/2017 Target Resolution Date: 12/14/2017 Goal Status: Active Interventions: Assess Activities of Daily Living upon admission and as needed Assess fall risk on admission and as needed Assess: immobility, friction, shearing, incontinence upon admission and as needed Assess impairment of mobility on admission and as needed per policy Assess personal safety and home safety (as indicated) on admission and as needed Assess self care needs on admission and as needed Notes: ` Nutrition Nursing Diagnoses: Imbalanced nutrition Potential for alteratiion in Nutrition/Potential for imbalanced nutrition Goals: Patient/caregiver agrees to and verbalizes understanding of need to use nutritional supplements and/or vitamins as prescribed Date Initiated: 08/19/2017 Target Resolution Date: 11/16/2017 Goal Status: Active Interventions: Assess patient nutrition upon admission and as needed per policy Notes: ` Orientation to the Wound Care Program Nursing Diagnoses: Lembo, Nathanial L. ([id number]) Knowledge deficit related to the wound healing center program Goals: Patient/caregiver will verbalize understanding of the [place] Date Initiated: 08/19/2017 Target Resolution Date: 09/14/2017 Goal Status: Active Interventions: Provide education on orientation to the wound center Notes: ` Pain, Acute or Chronic Nursing Diagnoses: Pain, acute or chronic: actual or potential Potential alteration in comfort, pain Goals: Patient/caregiver will verbalize adequate pain control between visits Date Initiated: 08/19/2017 Target Resolution Date: 12/14/2017 Goal Status: Active Interventions: Complete pain assessment as per visit requirements Notes: ` Pressure Nursing Diagnoses: Knowledge deficit related to causes and  risk factors for pressure ulcer development Knowledge deficit related to management of pressures ulcers Potential for impaired tissue  integrity related to pressure, friction, moisture, and shear Goals: Patient will remain free from development of additional pressure ulcers Date Initiated: 08/19/2017 Target Resolution Date: 12/14/2017 Goal Status: Active Interventions: Assess: immobility, friction, shearing, incontinence upon admission and as needed Assess potential for pressure ulcer upon admission and as needed Notes: ` Wound/Skin Impairment Nursing Diagnoses: Impaired tissue integrity Knowledge deficit related to ulceration/compromised skin integrity [name], [name] ([id number]) Goals: Ulcer/skin breakdown will have a volume reduction of 80% by week 12 Date Initiated: 08/19/2017 Target Resolution Date: 12/14/2017 Goal Status: Active Interventions: Assess patient/caregiver ability to perform ulcer/skin care regimen upon admission and as needed Assess ulceration(s) every visit Notes: Electronic Signature(s) Signed: 10/11/2017 3:34:53 PM By: Montey Hora Entered By: Montey Hora on 10/11/2017 13:23:21 [name], [name] ([id number]) -------------------------------------------------------------------------------- Pain Assessment Details Patient Name: [name], [name] Date of Service: 10/11/2017 1:00 PM Medical Record Number: [id number] Patient Account Number: [id number] Date of Birth/Sex: 03-Jan-1926 (84 y.o. M) Treating RN: [name] Primary Care Imani Fiebelkorn: [name] Other Clinician: Referring Tarry Fountain: [name] Treating Staley Budzinski/Extender: [name], [name] [name]: 7 Active Problems Location of Pain Severity and Description of Pain Patient Has Paino Yes Site Locations Pain Location: Pain in Ulcers Rate the pain. Current Pain Level: 3 Character of Pain Describe the Pain: Aching Pain Management and Medication Current Pain Management: Notes Topical or injectable lidocaine is offered to patient for acute pain when surgical debridement is performed. If needed, Patient is instructed to use  over the counter pain medication for the following 24-48 hours after debridement. Wound care MDs do not prescribed pain medications. Patient has chronic pain or uncontrolled pain. Patient has been instructed to make an appointment with their Primary Care Physician for pain management. Electronic Signature(s) Signed: 10/14/2017 4:32:12 PM By: [name] Entered By: [name] on 10/11/2017 13:00:08 [name], [name] ([id number]) -------------------------------------------------------------------------------- Patient/Caregiver Education Details Patient Name: [name], [name] Date of Service: 10/11/2017 1:00 PM Medical Record Number: [id number] Patient Account Number: [id number] Date of Birth/Gender: Mar 17, 1926 (84 y.o. M) Treating RN: [name] Primary Care Physician: [name] Other Clinician: Referring Physician: Ramonita [name] Treating Physician/Extender: Sharalyn Ink in [name]: 7 Education Assessment Education Provided To: Patient Education Topics Provided Wound/Skin Impairment: Handouts: Caring for Your Ulcer, Other: change dressing as ordered Methods: Demonstration, Explain/Verbal Responses: State content correctly Electronic Signature(s) Signed: 10/14/2017 4:32:12 PM By: [name] Entered By: [name] on 10/11/2017 13:51:14 [name], [name] ([id number]) -------------------------------------------------------------------------------- Wound Assessment Details Patient Name: [name], Fletcher L. Date of Service: 10/11/2017 1:00 PM Medical Record Number: [id number] Patient Account Number: [id number] Date of Birth/Sex: 1925-10-05 (84 y.o. M) Treating RN: [name] Primary Care Tilton Marsalis: [name] Other Clinician: Referring Omarr Hann: [name] Treating Beckett Hickmon/Extender: [name], [name] [name]: 7 Wound Status Wound Number: 1 Primary Etiology: Pressure Ulcer Wound Location: Left Calcaneus Wound Status: Open Wounding Event:  Pressure Injury Comorbid Arrhythmia, Hypertension, Received History: Radiation Date Acquired: 08/12/2017 [name] Of [name]: 7 Clustered Wound: No Photos Photo Uploaded By: [name] on 10/14/2017 08:42:49 Wound Measurements Length: (cm) 4.2 Width: (cm) 4.5 Depth: (cm) 0.2 Area: (cm) 14.844 Volume: (cm) 2.969 % Reduction in Area: 35.1% % Reduction in Volume: 35.1% Epithelialization: None Tunneling: No Undermining: No Wound Description Classification: Unstageable/Unclassified Wound Margin: Distinct, outline attached Exudate Amount: Large Exudate Type: Serous Exudate Color: amber Foul Odor After Cleansing: No Slough/Fibrino Yes Wound Bed Granulation Amount: None  Present (0%) Exposed Structure Necrotic Amount: Large (67-100%) Fascia Exposed: No Necrotic Quality: Eschar Fat Layer (Subcutaneous Tissue) Exposed: No Tendon Exposed: No Muscle Exposed: No Joint Exposed: No Bone Exposed: No Periwound Skin Texture Texture Color No Abnormalities Noted: No No Abnormalities Noted: No Moisture Temperature / Pain Wandler, Markian L. ([id number]) No Abnormalities Noted: No Temperature: No Abnormality Dry / Scaly: Yes Tenderness on Palpation: Yes Wound Preparation Ulcer Cleansing: Rinsed/Irrigated with Saline Topical Anesthetic Applied: Other: lidocaine 4%, [name] Notes Wound #1 (Left Calcaneus) 1. Cleansed with: Clean wound with Normal Saline 2. Anesthetic Topical Lidocaine 4% cream to wound bed prior to debridement 4. Dressing Applied: Santyl Ointment 5. Secondary Dressing Applied Dry Gauze Kerlix/Conform Saline moistened guaze 7. Secured with Tape Notes Heel Cup and Charity fundraiser to [employer]) Signed: 10/14/2017 4:32:12 PM By: [name] Entered By: [name] on 10/11/2017 13:14:31 Barlowe, Marquand ([id number]) -------------------------------------------------------------------------------- Wound Assessment Details Patient  Name: PITKIN, Jerl L. Date of Service: 10/11/2017 1:00 PM Medical Record Number: [id number] Patient Account Number: [id number] Date of Birth/Sex: July 19, 1925 (84 y.o. M) Treating RN: [name] Primary Care Zylee Marchiano: [name] Other Clinician: Referring Zaide Mcclenahan: [name] Treating Jamesen Stahnke/Extender: [name], [name] [name]: 7 Wound Status Wound Number: 2 Primary Etiology: Pressure Ulcer Wound Location: Right Calcaneus Wound Status: Open Wounding Event: Pressure Injury Comorbid Arrhythmia, Hypertension, Received History: Radiation Date Acquired: 08/12/2017 [name] Of [name]: 7 Clustered Wound: No Photos Photo Uploaded By: [name] on 10/14/2017 08:43:07 Wound Measurements Length: (cm) 2.5 Width: (cm) 3.3 Depth: (cm) 0.2 Area: (cm) 6.48 Volume: (cm) 1.296 % Reduction in Area: 43.6% % Reduction in Volume: 43.6% Epithelialization: None Tunneling: No Undermining: No Wound Description Classification: Unstageable/Unclassified Wound Margin: Thickened Exudate Amount: Large Exudate Type: Serous Exudate Color: amber Foul Odor After Cleansing: No Slough/Fibrino Yes Wound Bed Granulation Amount: None Present (0%) Exposed Structure Necrotic Amount: Large (67-100%) Fascia Exposed: No Necrotic Quality: Eschar Fat Layer (Subcutaneous Tissue) Exposed: No Tendon Exposed: No Muscle Exposed: No Joint Exposed: No Bone Exposed: No Periwound Skin Texture Texture Color No Abnormalities Noted: No No Abnormalities Noted: No Moisture Temperature / Pain Dixson, Korver L. ([id number]) No Abnormalities Noted: No Temperature: No Abnormality Dry / Scaly: Yes Tenderness on Palpation: Yes Wound Preparation Ulcer Cleansing: Rinsed/Irrigated with Saline Topical Anesthetic Applied: Other: lidocaine 4%, [name] Notes Wound #2 (Right Calcaneus) 1. Cleansed with: Clean wound with Normal Saline 2. Anesthetic Topical Lidocaine 4% cream to wound bed prior to  debridement 5. Secondary Dressing Applied Dry Gauze Kerlix/Conform 7. Secured with Tape Notes Betadine, Heel Cup and Stretch Net to [employer]) Signed: 10/14/2017 4:32:12 PM By: [name] Entered By: [name] on 10/11/2017 13:15:06 Vallery, Lindon L. ([id number]) -------------------------------------------------------------------------------- Wound Assessment Details Patient Name: EKHOLM, Jett L. Date of Service: 10/11/2017 1:00 PM Medical Record Number: [id number] Patient Account Number: [id number] Date of Birth/Sex: 09/01/1925 (84 y.o. M) Treating RN: [name] Primary Care Lydell Moga: [name] Other Clinician: Referring Lennis Rader: [name] Treating Taye Cato/Extender: [name], [name] [name]: 7 Wound Status Wound Number: 3 Primary Etiology: Pressure Ulcer Wound Location: Toe Great Wound Status: Open Wounding Event: Pressure Injury Comorbid Arrhythmia, Hypertension, Received History: Radiation Date Acquired: 09/02/2017 [name] Of [name]: 4 Clustered Wound: No Photos Photo Uploaded By: [name] on 10/14/2017 08:43:07 Wound Measurements Length: (cm) 0.2 Width: (cm) 0.2 Depth: (cm) 0.1 Area: (cm) 0.031 Volume: (cm) 0.003 % Reduction in Area: 84.2% % Reduction in Volume: 85% Epithelialization: Medium (34-66%) Tunneling: No Undermining: No Wound Description Classification: Category/Stage  II Wound Margin: Flat and Intact Exudate Amount: Small Exudate Type: Serosanguineous Exudate Color: red, brown Foul Odor After Cleansing: No Slough/Fibrino Yes Wound Bed Granulation Amount: None Present (0%) Exposed Structure Necrotic Amount: Large (67-100%) Fascia Exposed: No Necrotic Quality: Eschar, Adherent Slough Fat Layer (Subcutaneous Tissue) Exposed: No Tendon Exposed: No Muscle Exposed: No Joint Exposed: No Bone Exposed: No Periwound Skin Texture Texture Color No Abnormalities Noted: No No Abnormalities  Noted: No Callus: No Atrophie Blanche: No Leiphart, Esbon ([id number]) Crepitus: No Cyanosis: No Excoriation: No Ecchymosis: No Induration: No Erythema: No Rash: No Hemosiderin Staining: No Scarring: No Mottled: No Pallor: No Moisture Rubor: No No Abnormalities Noted: No Dry / Scaly: No Temperature / Pain Maceration: No Temperature: No Abnormality Wound Preparation Ulcer Cleansing: Rinsed/Irrigated with Saline Topical Anesthetic Applied: Other: lidocAINE 4%, [name] Notes Wound #3 (Toe Great) 1. Cleansed with: Clean wound with Normal Saline 2. Anesthetic Topical Lidocaine 4% cream to wound bed prior to debridement Notes Betadine Electronic Signature(s) Signed: 10/14/2017 4:32:12 PM By: [name] Entered By: [name] on 10/11/2017 13:15:26 Coulon, Istvan L. ([id number]) -------------------------------------------------------------------------------- Vitals Details Patient Name: KUNATH, Charon L. Date of Service: 10/11/2017 1:00 PM Medical Record Number: [id number] Patient Account Number: [id number] Date of Birth/Sex: September 04, 1925 (84 y.o. M) Treating RN: [name] Primary Care Mackinley Cassaday: [name] Other Clinician: Referring Edynn Gillock: [name] Treating Javontae Marlette/Extender: [name], [name] [name]: 7 Vital Signs Time Taken: 13:00 Pulse (bpm): 70 Height (in): 70 Respiratory Rate (breaths/min): 16 Weight (lbs): 190 Blood Pressure (mmHg): 120/72 Body Mass Index (BMI): 27.3 Reference Range: 80 - 120 mg / dl Electronic Signature(s) Signed: 10/14/2017 4:32:12 PM By: [name] Entered By: [name] on 10/11/2017 13:03:28

## 2017-10-15 NOTE — Progress Notes (Signed)
[name] ([id number]) Visit Report for 10/11/2017 Chief Complaint Document Details Patient Name: [name], [name] Date of Service: 10/11/2017 1:00 PM Medical Record Number: [id number] Patient Account Number: [id number] Date of Birth/Sex: Aug 10, 1925 (84 y.o. M) Treating RN: Montey Hora Primary Care Provider: Ramonita Lab Other Clinician: Referring Provider: Ramonita Lab Treating Provider/Extender: Melburn Hake, Xan Sparkman Weeks in Treatment: 7 Information Obtained from: Patient Chief Complaint Bilateral Heel pressure ulcers and bilateral great toe DTIs Electronic Signature(s) Signed: 10/11/2017 5:57:55 PM By: Worthy Keeler PA-C Entered By: Worthy Keeler on 10/11/2017 13:21:59 [name] ([id number]) -------------------------------------------------------------------------------- Debridement Details Patient Name: [name] Date of Service: 10/11/2017 1:00 PM Medical Record Number: [id number] Patient Account Number: [id number] Date of Birth/Sex: [birth year] (84 y.o. M) Treating RN: Montey Hora Primary Care Provider: Ramonita Lab Other Clinician: Referring Provider: Ramonita Lab Treating Provider/Extender: Melburn Hake, Dirck Butch Weeks in Treatment: 7 Debridement Performed for Wound #1 Left Calcaneus Assessment: Performed By: Physician STONE III, Natividad Halls E., PA-C Debridement Type: Debridement Pre-procedure Verification/Time Yes - 13:27 Out Taken: Start Time: 13:27 Pain Control: Lidocaine 4% Topical Solution Total Area Debrided ([name] x W): 4.2 (cm) x 4.5 (cm) = 18.9 (cm) Tissue and other material Eschar, Slough, Subcutaneous, Skin: Dermis , Skin: Epidermis, Slough debrided: Level: Skin/Subcutaneous Tissue Debridement Description: Excisional Instrument: Forceps, Scissors Bleeding: Minimum Hemostasis Achieved: Pressure End Time: 13:33 Procedural Pain: 0 Post Procedural Pain: 0 Response to Treatment: Procedure was tolerated well Post Debridement Measurements of Total  Wound Length: (cm) 4.5 Stage: Unstageable/Unclassified Width: (cm) 3.5 Depth: (cm) 0.2 Volume: (cm) 2.474 Character of Wound/Ulcer Post Improved Debridement: Post Procedure Diagnosis Same as Pre-procedure Electronic Signature(s) Signed: 10/11/2017 3:34:53 PM By: Montey Hora Signed: 10/11/2017 5:57:55 PM By: Worthy Keeler PA-C Entered By: Montey Hora on 10/11/2017 13:36:48 [name] ([id number]) -------------------------------------------------------------------------------- Debridement Details Patient Name: [name] Date of Service: 10/11/2017 1:00 PM Medical Record Number: [id number] Patient Account Number: [id number] Date of Birth/Sex: 01-31-1926 (84 y.o. M) Treating RN: Montey Hora Primary Care Provider: Ramonita Lab Other Clinician: Referring Provider: Ramonita Lab Treating Provider/Extender: Melburn Hake, Elia Keenum Weeks in Treatment: 7 Debridement Performed for Wound #2 Right Calcaneus Assessment: Performed By: Physician STONE III, Aveyah Greenwood E., PA-C Debridement Type: Debridement Pre-procedure Verification/Time Yes - 13:33 Out Taken: Start Time: 13:33 Pain Control: Lidocaine 4% Topical Solution Total Area Debrided ([name] x W): 2.5 (cm) x 3.3 (cm) = 8.25 (cm) Tissue and other material Non-Viable, Eschar debrided: Level: Non-Viable Tissue Debridement Description: Selective/Open Wound Instrument: Forceps, Scissors Bleeding: Minimum Hemostasis Achieved: Pressure End Time: 13:35 Procedural Pain: 0 Post Procedural Pain: 0 Response to Treatment: Procedure was tolerated well Post Debridement Measurements of Total Wound Length: (cm) 2.5 Stage: Unstageable/Unclassified Width: (cm) 2.8 Depth: (cm) 0.1 Volume: (cm) 0.55 Character of Wound/Ulcer Post Improved Debridement: Post Procedure Diagnosis Same as Pre-procedure Electronic Signature(s) Signed: 10/11/2017 3:34:53 PM By: Montey Hora Signed: 10/11/2017 5:57:55 PM By: Worthy Keeler PA-C Entered By:  Worthy Keeler on 10/11/2017 13:39:37 [name] ([id number]) -------------------------------------------------------------------------------- HPI Details Patient Name: [name] Date of Service: 10/11/2017 1:00 PM Medical Record Number: [id number] Patient Account Number: [id number] Date of Birth/Sex: April 29, 1926 (84 y.o. M) Treating RN: Montey Hora Primary Care Provider: Ramonita Lab Other Clinician: Referring Provider: Ramonita Lab Treating Provider/Extender: Melburn Hake, Audine Mangione Weeks in Treatment: 7 History of Present Illness Associated Signs and Symptoms: Patient has a history of chronic atrophic relation, hypertension, and long-term use of anticoagulants. He also had a fall with pelvic fracture which occurred on January  2019. HPI Description: 08/19/17 patient presents today for initial evaluation and our clinic concerning issues he has been having with the bilateral heels as well as the lateral great toe was since he was admitted to the nursing facility following a pelvic fracture. Initially he was spending a significant amount of time in the bed although he is now up more often since the healing process has progressed along the way obviously. When he is up he's spending most of the time sitting in a wheelchair at this point. When he is in bed he lays on his back and the covers/sheets are pushing down on his toes bilaterally which I think it may be what's causing the issues with his deep tissue injury to the bilateral great toes. With that being said the hills also appear to be pressure in nature and I think that laying in the bed getting pressure to the sites is what has led to the formation of these on stage will pressure ulcers. Fortunately there does not appear to be any infection in both areas of ulceration of the heel appear to be stable at this point. Patient does not have any significant pain at the site she does have some of the eschar along the edges which is starting to  lift up and come all hopefully slowly but surely this will occur. Patient did have arterial studies performed April 2018 which showed a normal TBI on the right knee slightly depressed TBI on the left but this does not appear to have changed significantly at that point. Specifically this was 0.75 on the right and 0.61 on the left. 09/13/17 on evaluation today patient presents for a follow-up evaluation regarding his bilateral heal ulcers. He has been tolerating the dressing changes fairly well although he did have some bleeding earlier today when it appears some of the skin on the surrounding portion of the wound got pulled where this is starting to flake off. Subsequently there does not appear to be any bleeding right now. Since I last saw him he has been discharged from the facility and is now back at home. His wife is having a very difficult time being able to get him up and in the car in order to come for an appointment which is why they missed the last appointment have not been able to come back sooner. Fortunately the he does not have any signs of infection. No fevers chills noted. He does have difficulty walking due to pain in the bilateral heels. 10/11/17 on evaluation today patient presents for follow-up evaluation concerning his ongoing bilateral heal ulcers. He has been tolerating the dressing changes which were Betadine paints daily without complication. I have not seen him since September 13, 2017. Pressure that I saw him August 19, 2017. Subsequently those are the only prior visits before today. His left heel actually seems to have a significant amount of eschar that is starting to lift up at this point and seems to be a little more loose as far as drainage building up underneath the eschar. The right eschar though there are some parts lifting up seems to still be fairly stable which is good news. His toe ulcer is almost completely healed a lot of that eschar came off and it looks very  well underneath. Electronic Signature(s) Signed: 10/11/2017 5:57:55 PM By: Worthy Keeler PA-C Entered By: Worthy Keeler on 10/11/2017 17:48:03 Mennen, Boone [name]. ([id number]) -------------------------------------------------------------------------------- Physical Exam Details Patient Name: [place], [name] Date of Service: 10/11/2017 1:00 PM  Medical Record Number: [id number] Patient Account Number: [id number] Date of Birth/Sex: 1925-11-24 (84 y.o. M) Treating RN: Montey Hora Primary Care Provider: Ramonita Lab Other Clinician: Referring Provider: Ramonita Lab Treating Provider/Extender: Melburn Hake, Casia Corti Weeks in Treatment: 7 Constitutional Well-nourished and well-hydrated in no acute distress. Respiratory normal breathing without difficulty. Psychiatric this patient is able to make decisions and demonstrates good insight into disease process. Alert and Oriented x 3. pleasant and cooperative. Notes At this point today I did actually perform sharp debridement of the left heel including subcutaneous tissue, Slough, and eschar. Patient actually tolerated this with well without complication today. Because the eschar was lucid enough I did recommend crosshatching the eschar to hopefully aid and allow the Santyl to loosen this up. Subsequently I also did go ahead and remove the portion of eschar which was lifting up on the right heel as well. Patient tolerated this without any pain whatsoever. Post debridement both wounds actually appear to be better I do think the left heel will benefit from the Santyl. Electronic Signature(s) Signed: 10/11/2017 5:57:55 PM By: Worthy Keeler PA-C Entered By: Worthy Keeler on 10/11/2017 17:49:24 Arcos, [name] ([id number]) -------------------------------------------------------------------------------- Physician Orders Details Patient Name: DILLION, STOWERS [name]. Date of Service: 10/11/2017 1:00 PM Medical Record Number: [id number] Patient Account Number:  [id number] Date of Birth/Sex: 08/26/1925 (84 y.o. M) Treating RN: Montey Hora Primary Care Provider: Ramonita Lab Other Clinician: Referring Provider: Ramonita Lab Treating Provider/Extender: Melburn Hake, Brittainy Bucker Weeks in Treatment: 7 Verbal / Phone Orders: No Diagnosis Coding ICD-10 Coding Code Description L89.620 Pressure ulcer of left heel, unstageable L89.610 Pressure ulcer of right heel, unstageable I48.2 Chronic atrial fibrillation I10 Essential (primary) hypertension Z79.01 Long term (current) use of anticoagulants Wound Cleansing Wound #1 Left Calcaneus o Clean wound with Normal Saline. o Cleanse wound with mild soap and water o May Shower, gently pat wound dry prior to applying new dressing. Wound #2 Right Calcaneus o Clean wound with Normal Saline. o Cleanse wound with mild soap and water o May Shower, gently pat wound dry prior to applying new dressing. Wound #3 Toe Great o Clean wound with Normal Saline. o Cleanse wound with mild soap and water o May Shower, gently pat wound dry prior to applying new dressing. Primary Wound Dressing Wound #1 Left Calcaneus o Santyl Ointment Wound #2 Right Calcaneus o Other: - provodone-iodine Wound #3 Toe Great o Other: - provodone-iodine Secondary Dressing Wound #1 Left Calcaneus o Dry Gauze o Foam Wound #2 Right Calcaneus o Dry Gauze o Foam Tieu, Justan [name]. ([id number]) Wound #3 Toe Great o Other - no bandages Dressing Change Frequency Wound #1 Left Calcaneus o Change dressing every day. o Change dressing twice daily. - morning and evening Wound #2 Right Calcaneus o Change dressing every day. Wound #3 Toe Great o Change dressing every day. Follow-up Appointments Wound #1 Left Calcaneus o Return Appointment in 2 weeks. Wound #2 Right Calcaneus o Return Appointment in 2 weeks. Wound #3 Toe Great o Return Appointment in 2 weeks. Off-Loading Wound #1 Left Calcaneus o  Heel suspension boot to: - Please order prevalon boots for bilateral feet Please float heel when pt is in the bed (NO PRESSURE ON HEELS) o Turn and reposition every 2 hours o Other: - Adjustable Blanket Lift Bar so the sheets and blanket does not put pressure on the toes Wound #2 Right Calcaneus o Heel suspension boot to: - Please order prevalon boots for bilateral feet Please float heel when pt is in the  bed (NO PRESSURE ON HEELS) o Turn and reposition every 2 hours o Other: - Adjustable Blanket Lift Bar so the sheets and blanket does not put pressure on the toes Wound #3 Toe Great o Heel suspension boot to: - Please order prevalon boots for bilateral feet Please float heel when pt is in the bed (NO PRESSURE ON HEELS) o Turn and reposition every 2 hours o Other: - Adjustable Blanket Lift Bar so the sheets and blanket does not put pressure on the toes Additional Orders / Instructions Wound #1 Left Calcaneus o Increase protein intake. Wound #2 Right Calcaneus o Increase protein intake. Wound #3 Toe Great o Increase protein intake. [place] ([id number]) Wound #1 Left [place] Nurse may visit PRN to address patientos wound care needs. o FACE TO FACE ENCOUNTER: MEDICARE and MEDICAID PATIENTS: I certify that this patient is under my care and that I had a face-to-face encounter that meets the physician face-to-face encounter requirements with this patient on this date. The encounter with the patient was in whole or in part for the following MEDICAL CONDITION: (primary reason for [place]) MEDICAL NECESSITY: I certify, that based on my findings, NURSING services are a medically necessary home health service. HOME BOUND STATUS: I certify that my clinical findings support that this patient is homebound (i.e., Due to illness or injury, pt requires aid of supportive devices such as crutches, cane,  wheelchairs, walkers, the use of special transportation or the assistance of another person to leave their place of residence. There is a normal inability to leave the home and doing so requires considerable and taxing effort. Other absences are for medical reasons / religious services and are infrequent or of short duration when for other reasons). o If current dressing causes regression in wound condition, may D/C ordered dressing product/s and apply Normal Saline Moist Dressing daily until next [place] / Other MD appointment. [place] of regression in wound condition at [phone]. o Please direct any NON-WOUND related issues/requests for orders to patient's Primary Care Physician Wound #2 Right [place] Nurse may visit PRN to address patientos wound care needs. o FACE TO FACE ENCOUNTER: MEDICARE and MEDICAID PATIENTS: I certify that this patient is under my care and that I had a face-to-face encounter that meets the physician face-to-face encounter requirements with this patient on this date. The encounter with the patient was in whole or in part for the following MEDICAL CONDITION: (primary reason for [place]) MEDICAL NECESSITY: I certify, that based on my findings, NURSING services are a medically necessary home health service. HOME BOUND STATUS: I certify that my clinical findings support that this patient is homebound (i.e., Due to illness or injury, pt requires aid of supportive devices such as crutches, cane, wheelchairs, walkers, the use of special transportation or the assistance of another person to leave their place of residence. There is a normal inability to leave the home and doing so requires considerable and taxing effort. Other absences are for medical reasons / religious services and are infrequent or of short duration when for other reasons). o If current dressing causes  regression in wound condition, may D/C ordered dressing product/s and apply Normal Saline Moist Dressing daily until next [place] / Other MD appointment. [place] of regression in wound condition at [phone]. o Please direct any NON-WOUND related issues/requests for orders  to patient's Primary Care Physician Wound #3 [place] Visits o Home Health Nurse may visit PRN to address patientos wound care needs. o FACE TO FACE ENCOUNTER: MEDICARE and MEDICAID PATIENTS: I certify that this patient is under my care and that I had a face-to-face encounter that meets the physician face-to-face encounter requirements with this patient on this date. The encounter with the patient was in whole or in part for the following MEDICAL CONDITION: (primary reason for [place]) MEDICAL NECESSITY: I certify, that based on my findings, NURSING services are a medically necessary home health service. HOME BOUND STATUS: I certify that my clinical findings support that this patient is homebound (i.e., Due to illness or injury, pt requires aid of supportive devices such as crutches, cane, wheelchairs, walkers, the use of special transportation or the assistance of another person to leave their place of residence. There is a normal inability to leave the home and doing so requires considerable and taxing effort. Other absences are for medical reasons / religious services and are infrequent or of short duration when for other reasons). o If current dressing causes regression in wound condition, may D/C ordered dressing product/s and apply Normal Saline Moist Dressing daily until next [place] / Other MD appointment. [place] of regression in wound condition at [phone]. o Please direct any NON-WOUND related issues/requests for orders to patient's Primary Care Physician Medications-please add to medication  list. o Santyl Enzymatic Ointment Patient Medications [place], [place] ([id number]) Allergies: NKDA Notifications Medication Indication Start End Santyl 10/11/2017 DOSE topical 250 unit/gram ointment - ointment topical applied to the left heel wound daily and cover with dressing as directed. Electronic Signature(s) Signed: 10/11/2017 1:41:40 PM By: Worthy Keeler PA-C Entered By: Worthy Keeler on 10/11/2017 13:41:39 Copes, [place] ([id number]) -------------------------------------------------------------------------------- Problem List Details Patient Name: CIOTTI, [name] Date of Service: 10/11/2017 1:00 PM Medical Record Number: [id number] Patient Account Number: [id number] Date of Birth/Sex: [birth year] (84 y.o. M) Treating RN: Montey Hora Primary Care Provider: Ramonita Lab Other Clinician: Referring Provider: Ramonita Lab Treating Provider/Extender: Melburn Hake, Maili Shutters Weeks in Treatment: 7 Active Problems ICD-10 Impacting Encounter Code Description Active Date Wound Healing Diagnosis L89.620 Pressure ulcer of left heel, unstageable 08/19/2017 Yes L89.610 Pressure ulcer of right heel, unstageable 08/19/2017 Yes I48.2 Chronic atrial fibrillation 08/19/2017 Yes I10 Essential (primary) hypertension 08/19/2017 Yes Z79.01 Long term (current) use of anticoagulants 08/19/2017 Yes Inactive Problems Resolved Problems Electronic Signature(s) Signed: 10/11/2017 5:57:55 PM By: Worthy Keeler PA-C Entered By: Worthy Keeler on 10/11/2017 13:21:52 Lahman, [place] ([id number]) -------------------------------------------------------------------------------- Progress Note Details Patient Name: [place], [name] Date of Service: 10/11/2017 1:00 PM Medical Record Number: [id number] Patient Account Number: [id number] Date of Birth/Sex: [birth year] (84 y.o. M) Treating RN: Montey Hora Primary Care Provider: Ramonita Lab Other Clinician: Referring Provider: Ramonita Lab Treating Provider/Extender:  Melburn Hake, Pratyush Ammon Weeks in Treatment: 7 Subjective Chief Complaint Information obtained from Patient Bilateral Heel pressure ulcers and bilateral great toe DTIs History of Present Illness (HPI) The following HPI elements were documented for the patient's wound: Associated Signs and Symptoms: Patient has a history of chronic atrophic relation, hypertension, and long-term use of anticoagulants. He also had a fall with pelvic fracture which occurred on January 2019. 08/19/17 patient presents today for initial evaluation and our clinic concerning issues he has been having with the bilateral heels as well as the lateral great toe was since he was admitted to the nursing facility  following a pelvic fracture. Initially he was spending a significant amount of time in the bed although he is now up more often since the healing process has progressed along the way obviously. When he is up he's spending most of the time sitting in a wheelchair at this point. When he is in bed he lays on his back and the covers/sheets are pushing down on his toes bilaterally which I think it may be what's causing the issues with his deep tissue injury to the bilateral great toes. With that being said the hills also appear to be pressure in nature and I think that laying in the bed getting pressure to the sites is what has led to the formation of these on stage will pressure ulcers. Fortunately there does not appear to be any infection in both areas of ulceration of the heel appear to be stable at this point. Patient does not have any significant pain at the site she does have some of the eschar along the edges which is starting to lift up and come all hopefully slowly but surely this will occur. Patient did have arterial studies performed April 2018 which showed a normal TBI on the right knee slightly depressed TBI on the left but this does not appear to have changed significantly at that point. Specifically this was 0.75 on  the right and 0.61 on the left. 09/13/17 on evaluation today patient presents for a follow-up evaluation regarding his bilateral heal ulcers. He has been tolerating the dressing changes fairly well although he did have some bleeding earlier today when it appears some of the skin on the surrounding portion of the wound got pulled where this is starting to flake off. Subsequently there does not appear to be any bleeding right now. Since I last saw him he has been discharged from the facility and is now back at home. His wife is having a very difficult time being able to get him up and in the car in order to come for an appointment which is why they missed the last appointment have not been able to come back sooner. Fortunately the he does not have any signs of infection. No fevers chills noted. He does have difficulty walking due to pain in the bilateral heels. 10/11/17 on evaluation today patient presents for follow-up evaluation concerning his ongoing bilateral heal ulcers. He has been tolerating the dressing changes which were Betadine paints daily without complication. I have not seen him since September 13, 2017. Pressure that I saw him August 19, 2017. Subsequently those are the only prior visits before today. His left heel actually seems to have a significant amount of eschar that is starting to lift up at this point and seems to be a little more loose as far as drainage building up underneath the eschar. The right eschar though there are some parts lifting up seems to still be fairly stable which is good news. His toe ulcer is almost completely healed a lot of that eschar came off and it looks very well underneath. Patient History Information obtained from Patient. Family History No family history of Cancer, Diabetes, Heart Disease, Hereditary Spherocytosis, Hypertension, Kidney Disease, Lung Lantry, Azai [name]. ([id number]) Disease, Seizures, Stroke, Thyroid Problems, Tuberculosis. Social  History Former smoker, Marital Status - Married, Alcohol Use - Rarely - 2oz day, Drug Use - No History, Caffeine Use - Daily. Review of Systems (ROS) Constitutional Symptoms (General Health) Denies complaints or symptoms of Fever, Chills. Respiratory The patient has no complaints  or symptoms. Cardiovascular The patient has no complaints or symptoms. Psychiatric The patient has no complaints or symptoms. Objective Constitutional Well-nourished and well-hydrated in no acute distress. Vitals Time Taken: 1:00 PM, Height: 70 in, Weight: 190 lbs, BMI: 27.3, Pulse: 70 bpm, Respiratory Rate: 16 breaths/min, Blood Pressure: 120/72 mmHg. Respiratory normal breathing without difficulty. Psychiatric this patient is able to make decisions and demonstrates good insight into disease process. Alert and Oriented x 3. pleasant and cooperative. General Notes: At this point today I did actually perform sharp debridement of the left heel including subcutaneous tissue, Slough, and eschar. Patient actually tolerated this with well without complication today. Because the eschar was lucid enough I did recommend crosshatching the eschar to hopefully aid and allow the Santyl to loosen this up. Subsequently I also did go ahead and remove the portion of eschar which was lifting up on the right heel as well. Patient tolerated this without any pain whatsoever. Post debridement both wounds actually appear to be better I do think the left heel will benefit from the Santyl. Integumentary (Hair, Skin) Wound #1 status is Open. Original cause of wound was Pressure Injury. The wound is located on the Left Calcaneus. The wound measures 4.2cm length x 4.5cm width x 0.2cm depth; 14.844cm^2 area and 2.969cm^3 volume. There is no tunneling or undermining noted. There is a large amount of serous drainage noted. The wound margin is distinct with the outline attached to the wound base. There is no granulation within the wound bed.  There is a large (67-100%) amount of necrotic tissue within the wound bed including Eschar. The periwound skin appearance exhibited: Dry/Scaly. Periwound temperature was noted as No Abnormality. The periwound has tenderness on palpation. Wound #2 status is Open. Original cause of wound was Pressure Injury. The wound is located on the Right Calcaneus. The wound measures 2.5cm length x 3.3cm width x 0.2cm depth; 6.48cm^2 area and 1.296cm^3 volume. There is no tunneling or undermining noted. There is a large amount of serous drainage noted. The wound margin is thickened. There is no granulation within the wound bed. There is a large (67-100%) amount of necrotic tissue within the wound bed including Eschar. The periwound skin appearance exhibited: Dry/Scaly. Periwound temperature was noted as No Abnormality. The [place], [place] ([id number]) periwound has tenderness on palpation. Wound #3 status is Open. Original cause of wound was Pressure Injury. The wound is located on the [employer] The wound measures 0.2cm length x 0.2cm width x 0.1cm depth; 0.031cm^2 area and 0.003cm^3 volume. There is no tunneling or undermining noted. There is a small amount of serosanguineous drainage noted. The wound margin is flat and intact. There is no granulation within the wound bed. There is a large (67-100%) amount of necrotic tissue within the wound bed including Eschar and Adherent Slough. The periwound skin appearance did not exhibit: Callus, Crepitus, Excoriation, Induration, Rash, Scarring, Dry/Scaly, Maceration, Atrophie Blanche, Cyanosis, Ecchymosis, Hemosiderin Staining, Mottled, Pallor, Rubor, Erythema. Periwound temperature was noted as No Abnormality. Assessment Active Problems ICD-10 L89.620 - Pressure ulcer of left heel, unstageable L89.610 - Pressure ulcer of right heel, unstageable I48.2 - Chronic atrial fibrillation I10 - Essential (primary) hypertension Z79.01 - Long term (current) use of  anticoagulants Procedures Wound #1 Pre-procedure diagnosis of Wound #1 is a Pressure Ulcer located on the Left Calcaneus . There was a Excisional Skin/Subcutaneous Tissue Debridement with a total area of 18.9 sq cm performed by STONE III, Jasmene Goswami E., PA-C. With the following instrument(s): Forceps, and Scissors. Material  removed includes Eschar, Subcutaneous Tissue, and Slough, Skin: Dermis, Skin: Epidermis, and Slough after achieving pain control using Lidocaine 4% Topical Solution. No specimens were taken. A time out was conducted at 13:27, prior to the start of the procedure. A Minimum amount of bleeding was controlled with Pressure. The procedure was tolerated well with a pain level of 0 throughout and a pain level of 0 following the procedure. Post Debridement Measurements: 4.5cm length x 3.5cm width x 0.2cm depth; 2.474cm^3 volume. Post debridement Stage noted as Unstageable/Unclassified. Character of Wound/Ulcer Post Debridement is improved. Post procedure Diagnosis Wound #1: Same as Pre-Procedure Wound #2 Pre-procedure diagnosis of Wound #2 is a Pressure Ulcer located on the Right Calcaneus . There was a Selective/Open Wound Non-Viable Tissue Debridement with a total area of 8.25 sq cm performed by STONE III, Allea Kassner E., PA-C. With the following instrument(s): Forceps, and Scissors. to remove Non-Viable tissue/material Material removed includes Eschar after achieving pain control using Lidocaine 4% Topical Solution. No specimens were taken. A time out was conducted at 13:33, prior to the start of the procedure. A Minimum amount of bleeding was controlled with Pressure. The procedure was tolerated well with a pain level of 0 throughout and a pain level of 0 following the procedure. Post Debridement Measurements: 2.5cm length x 2.8cm width x 0.1cm depth; 0.55cm^3 volume. Post debridement Stage noted as Unstageable/Unclassified. Character of Wound/Ulcer Post Debridement is improved. Post  procedure Diagnosis Wound #2: Same as Pre-Procedure Treto, Opp ([id number]) Plan Wound Cleansing: Wound #1 Left Calcaneus: Clean wound with Normal Saline. Cleanse wound with mild soap and water May Shower, gently pat wound dry prior to applying new dressing. Wound #2 Right Calcaneus: Clean wound with Normal Saline. Cleanse wound with mild soap and water May Shower, gently pat wound dry prior to applying new dressing. Wound #3 Toe Great: Clean wound with Normal Saline. Cleanse wound with mild soap and water May Shower, gently pat wound dry prior to applying new dressing. Primary Wound Dressing: Wound #1 Left Calcaneus: Santyl Ointment Wound #2 Right Calcaneus: Other: - provodone-iodine Wound #3 Toe Great: Other: - provodone-iodine Secondary Dressing: Wound #1 Left Calcaneus: Dry Gauze Foam Wound #2 Right Calcaneus: Dry Gauze Foam Wound #3 Toe Great: Other - no bandages Dressing Change Frequency: Wound #1 Left Calcaneus: Change dressing every day. Change dressing twice daily. - morning and evening Wound #2 Right Calcaneus: Change dressing every day. Wound #3 Toe Great: Change dressing every day. Follow-up Appointments: Wound #1 Left Calcaneus: Return Appointment in 2 weeks. Wound #2 Right Calcaneus: Return Appointment in 2 weeks. Wound #3 Toe Great: Return Appointment in 2 weeks. Off-Loading: Wound #1 Left Calcaneus: Heel suspension boot to: - Please order prevalon boots for bilateral feet Please float heel when pt is in the bed (NO PRESSURE ON HEELS) Turn and reposition every 2 hours Other: - Adjustable Blanket Lift Bar so the sheets and blanket does not put pressure on the toes Wound #2 Right Calcaneus: Heel suspension boot to: - Please order prevalon boots for bilateral feet Please float heel when pt is in the bed (NO PRESSURE ON HEELS) Turn and reposition every 2 hours [place], Geffrey [name]. ([id number]) Other: - Adjustable Blanket Lift Bar so the sheets  and blanket does not put pressure on the toes Wound #3 Toe Great: Heel suspension boot to: - Please order prevalon boots for bilateral feet Please float heel when pt is in the bed (NO PRESSURE ON HEELS) Turn and reposition every 2 hours Other: - Adjustable  Airline pilot so the sheets and blanket does not put pressure on the toes Additional Orders / Instructions: Wound #1 Left Calcaneus: Increase protein intake. Wound #2 Right Calcaneus: Increase protein intake. Wound #3 Toe Great: Increase protein intake. Home Health: Wound #1 Left Calcaneus: [place] Nurse may visit PRN to address patient s wound care needs. FACE TO FACE ENCOUNTER: MEDICARE and MEDICAID PATIENTS: I certify that this patient is under my care and that I had a face-to-face encounter that meets the physician face-to-face encounter requirements with this patient on this date. The encounter with the patient was in whole or in part for the following MEDICAL CONDITION: (primary reason for [place]) MEDICAL NECESSITY: I certify, that based on my findings, NURSING services are a medically necessary home health service. HOME BOUND STATUS: I certify that my clinical findings support that this patient is homebound (i.e., Due to illness or injury, pt requires aid of supportive devices such as crutches, cane, wheelchairs, walkers, the use of special transportation or the assistance of another person to leave their place of residence. There is a normal inability to leave the home and doing so requires considerable and taxing effort. Other absences are for medical reasons / religious services and are infrequent or of short duration when for other reasons). If current dressing causes regression in wound condition, may D/C ordered dressing product/s and apply Normal Saline Moist Dressing daily until next [place] / Other MD appointment. [place] of regression in wound  condition at [phone]. Please direct any NON-WOUND related issues/requests for orders to patient's Primary Care Physician Wound #2 Right Calcaneus: [place] Nurse may visit PRN to address patient s wound care needs. FACE TO FACE ENCOUNTER: MEDICARE and MEDICAID PATIENTS: I certify that this patient is under my care and that I had a face-to-face encounter that meets the physician face-to-face encounter requirements with this patient on this date. The encounter with the patient was in whole or in part for the following MEDICAL CONDITION: (primary reason for [place]) MEDICAL NECESSITY: I certify, that based on my findings, NURSING services are a medically necessary home health service. HOME BOUND STATUS: I certify that my clinical findings support that this patient is homebound (i.e., Due to illness or injury, pt requires aid of supportive devices such as crutches, cane, wheelchairs, walkers, the use of special transportation or the assistance of another person to leave their place of residence. There is a normal inability to leave the home and doing so requires considerable and taxing effort. Other absences are for medical reasons / religious services and are infrequent or of short duration when for other reasons). If current dressing causes regression in wound condition, may D/C ordered dressing product/s and apply Normal Saline Moist Dressing daily until next [place] / Other MD appointment. [place] of regression in wound condition at [phone]. Please direct any NON-WOUND related issues/requests for orders to patient's Primary Care Physician Wound #3 Toe Great: [place] Nurse may visit PRN to address patient s wound care needs. FACE TO FACE ENCOUNTER: MEDICARE and MEDICAID PATIENTS: I certify that this patient is under my care and that I had a face-to-face encounter that meets the  physician face-to-face encounter requirements with this patient on this date. The encounter with the patient was in whole or in part for the following MEDICAL CONDITION: (primary reason for [place]) MEDICAL NECESSITY: I  certify, that based on my findings, NURSING services are a medically necessary home health service. HOME BOUND STATUS: I certify that my clinical findings support that this patient is homebound (i.e., Due to illness or injury, pt requires aid of supportive devices such as crutches, cane, wheelchairs, walkers, the use of special transportation or the assistance of another person to leave their place of residence. There is a normal inability to leave the home and doing so requires considerable and taxing effort. Other absences are for medical reasons / religious services and are infrequent or of short duration when for other reasons). If current dressing causes regression in wound condition, may D/C ordered dressing product/s and apply Normal Saline Moist Dressing daily until next [place] / Other MD appointment. [place] of regression in [place], [place] ([id number]) wound condition at [phone]. Please direct any NON-WOUND related issues/requests for orders to patient's Primary Care Physician Medications-please add to medication list.: Santyl Enzymatic Ointment The following medication(s) was prescribed: Santyl topical 250 unit/gram ointment ointment topical applied to the left heel wound daily and cover with dressing as directed. starting 10/11/2017 I'm going to recommend that we continue with the Current wound care measures per above. Patient is in agreement that plan. His wife also is likewise in agreement. We will see were things stand at follow-up. Please see above for specific wound care orders. We will see patient for re-evaluation in 2 week(s) here in the clinic. If anything worsens or changes patient will contact our office for  additional recommendations. Electronic Signature(s) Signed: 10/11/2017 5:57:55 PM By: Worthy Keeler PA-C Entered By: Worthy Keeler on 10/11/2017 17:49:59 [place], [place] [name]. ([id number]) -------------------------------------------------------------------------------- ROS/PFSH Details Patient Name: MELLA, Noor [name]. Date of Service: 10/11/2017 1:00 PM Medical Record Number: [id number] Patient Account Number: [id number] Date of Birth/Sex: 07-16-1925 (84 y.o. M) Treating RN: Montey Hora Primary Care Provider: Ramonita Lab Other Clinician: Referring Provider: Ramonita Lab Treating Provider/Extender: Melburn Hake, Zionah Criswell Weeks in Treatment: 7 Information Obtained From Patient Wound History Do you currently have one or more open woundso Yes How many open wounds do you currently haveo 2 Approximately how long have you had your woundso 1 week How have you been treating your wound(s) until nowo nothing Has your wound(s) ever healed and then re-openedo No Have you had any lab work done in the past montho No Have you tested positive for osteomyelitis (bone infection)o No Have you had any tests for circulation on your legso No Have you had other problems associated with your woundso Swelling Constitutional Symptoms (General Health) Complaints and Symptoms: Negative for: Fever; Chills Respiratory Complaints and Symptoms: No Complaints or Symptoms Cardiovascular Complaints and Symptoms: No Complaints or Symptoms Medical History: Positive for: Arrhythmia - a-fib; Hypertension Oncologic Medical History: Positive for: Received Radiation Psychiatric Complaints and Symptoms: No Complaints or Symptoms Immunizations Pneumococcal Vaccine: Received Pneumococcal Vaccination: Yes Implantable Devices Family and Social History Wunder, Avin [name]. ([id number]) Cancer: No; Diabetes: No; Heart Disease: No; Hereditary Spherocytosis: No; Hypertension: No; Kidney Disease: No; Lung Disease: No; Seizures: No;  Stroke: No; Thyroid Problems: No; Tuberculosis: No; Former smoker; Marital Status - Married; Alcohol Use: Rarely - 2oz day; Drug Use: No History; Caffeine Use: Daily; Financial Concerns: No; Food, Clothing or Shelter Needs: No; Support System Lacking: No; Transportation Concerns: No; Advanced Directives: Yes (Not Provided); Patient does not want information on Advanced Directives; Do not resuscitate: No; Living Will: Yes (Not Provided); Medical Power of Attorney: No Physician Affirmation I have reviewed and  agree with the above information. Electronic Signature(s) Signed: 10/11/2017 5:57:55 PM By: Worthy Keeler PA-C Signed: 10/14/2017 4:12:39 PM By: Montey Hora Entered By: Worthy Keeler on 10/11/2017 17:48:35 [place], Mcclellan [name]. ([id number]) -------------------------------------------------------------------------------- SuperBill Details Patient Name: GONNELLA, Greogry [name]. Date of Service: 10/11/2017 Medical Record Number: [id number] Patient Account Number: [id number] Date of Birth/Sex: 1926/06/20 (84 y.o. M) Treating RN: Montey Hora Primary Care Provider: Ramonita Lab Other Clinician: Referring Provider: Ramonita Lab Treating Provider/Extender: Melburn Hake, Sontee Desena Weeks in Treatment: 7 Diagnosis Coding ICD-10 Codes Code Description L89.620 Pressure ulcer of left heel, unstageable L89.610 Pressure ulcer of right heel, unstageable I48.2 Chronic atrial fibrillation I10 Essential (primary) hypertension Z79.01 Long term (current) use of anticoagulants Facility Procedures CPT4 Code: [id number] Description: 91638 - DEB SUBQ TISSUE 20 SQ CM/< ICD-10 Diagnosis Description L89.620 Pressure ulcer of left heel, unstageable Modifier: Quantity: 1 CPT4 Code: [id number] Description: 70177 - DEBRIDE WOUND 1ST 20 SQ CM OR < ICD-10 Diagnosis Description L89.610 Pressure ulcer of right heel, unstageable Modifier: Quantity: 1 Physician Procedures CPT4 Code: [id number] Description: 92330 - WC PHYS SUBQ TISS 20 SQ  CM ICD-10 Diagnosis Description L89.620 Pressure ulcer of left heel, unstageable Modifier: Quantity: 1 CPT4 Code: [id number] Description: 97597 - WC PHYS DEBR WO ANESTH 20 SQ CM ICD-10 Diagnosis Description L89.610 Pressure ulcer of right heel, unstageable Modifier: Quantity: 1 Electronic Signature(s) Signed: 10/11/2017 5:57:55 PM By: Worthy Keeler PA-C Entered By: Worthy Keeler on 10/11/2017 17:50:16

## 2017-10-18 ENCOUNTER — Other Ambulatory Visit: Payer: Self-pay | Admitting: *Deleted

## 2017-10-18 DIAGNOSIS — L89620 Pressure ulcer of left heel, unstageable: Secondary | ICD-10-CM | POA: Diagnosis not present

## 2017-10-18 DIAGNOSIS — L89610 Pressure ulcer of right heel, unstageable: Secondary | ICD-10-CM | POA: Diagnosis not present

## 2017-10-18 NOTE — Patient Outreach (Signed)
[facility]) Care Management  10/18/2017  [name] 01-28-1926 [id number]  Telephone outreach follow up    84 year old male, on 1/7 ED admission after fall at home noted pubic rami fracture, no surgical plan, WBAT on RLE.  Discharged to [facility] on 1/9, DC to home on 08/23/17.   Successful outreach to patient able to speak with his wife, HIPAA verified.  Wife discussed patient is making slow progress with mobility, he is using walker and step few steps. Patient is still followed by home health PT and OT and wife states PT believes he is getting a little stronger.  Wife reports she now has ramp built, and she has practiced using ramp to get patient down to car, ramp was completed after his last appointment so it was not ready for use to last office visit and home health PT assisted her.  Discussed with wife option of having family/friend available to assist at next next medical appointment.  Discussed with wife patient daily care, she reports she is managing , continues to do sponge bath at present. Discussed with wife support she has at home and community resources to assist with daily care , she states she doesn't feel need for help at present . Discussed with  her self care as she is caring for patient.   Wife discussed recent wound care visit , and how they cleaned up wound. Home health RN continues twice weekly and wife changes dressing twice daily. Wife discussed hopeful healing at next wound clinic visit. Patient continues to rest in recliner at night as that works best for him.  Wife discussed patient appetite is not good just doesn't eat like he used to, he does drink at least one ensure a day.   Plan Will plan follow up visit in the next 2 weeks, for complex care management and assess for continued follow up care coordination needs and goal progression .     Joylene Draft, RN, [place] Management Coordinator   [phone]- Mobile [phone]- Toll Free Main Office

## 2017-10-25 ENCOUNTER — Encounter: Payer: PPO | Admitting: Physician Assistant

## 2017-10-25 DIAGNOSIS — L89620 Pressure ulcer of left heel, unstageable: Secondary | ICD-10-CM | POA: Diagnosis not present

## 2017-10-25 DIAGNOSIS — L89610 Pressure ulcer of right heel, unstageable: Secondary | ICD-10-CM | POA: Diagnosis not present

## 2017-10-25 DIAGNOSIS — L89623 Pressure ulcer of left heel, stage 3: Secondary | ICD-10-CM | POA: Diagnosis not present

## 2017-10-28 ENCOUNTER — Other Ambulatory Visit: Payer: Self-pay | Admitting: *Deleted

## 2017-10-28 DIAGNOSIS — I739 Peripheral vascular disease, unspecified: Secondary | ICD-10-CM | POA: Diagnosis not present

## 2017-10-28 DIAGNOSIS — E782 Mixed hyperlipidemia: Secondary | ICD-10-CM | POA: Diagnosis not present

## 2017-10-28 DIAGNOSIS — I482 Chronic atrial fibrillation: Secondary | ICD-10-CM | POA: Diagnosis not present

## 2017-10-28 DIAGNOSIS — D519 Vitamin B12 deficiency anemia, unspecified: Secondary | ICD-10-CM | POA: Diagnosis not present

## 2017-10-28 DIAGNOSIS — I10 Essential (primary) hypertension: Secondary | ICD-10-CM | POA: Diagnosis not present

## 2017-10-28 NOTE — Patient Outreach (Signed)
[name]) Care Management   10/28/2017  [name] April 25, 1926 [id number]  [name] is an 84 y.o. male  Subjective:  Patient discussed that he has made some progress in the last 2 month, now able to move around more using his walker.  He discussed slow progress with wound healing at his heels, but it takes time.   Objective:  BP 118/60 (BP Location: Left Arm, Patient Position: Sitting, Cuff Size: Normal)   Pulse 67   Resp 18   SpO2 97%  Review of Systems  Constitutional: Negative.   HENT: Negative.   Eyes: Negative.   Respiratory: Negative.   Cardiovascular: Negative.   Gastrointestinal: Negative.   Genitourinary: Negative.   Skin: Negative.   Neurological: Negative.   Endo/Heme/Allergies: Negative.   Psychiatric/Behavioral: Negative.     Physical Exam  Constitutional: He is oriented to person, place, and time. He appears well-developed and well-nourished.  Cardiovascular: Normal rate and normal heart sounds.  Respiratory: Effort normal.  GI: Soft. Bowel sounds are normal.  Neurological: He is alert and oriented to person, place, and time.  Skin: Skin is warm and dry.  Bilateral Alvelyn dressings noted on heels  Psychiatric: He has a normal mood and affect. His behavior is normal. Judgment and thought content normal.    Encounter Medications:   Outpatient Encounter Medications as of 10/28/2017  Medication Sig Note  . amLODipine (NORVASC) 5 MG tablet    . bisacodyl (DULCOLAX) 5 MG EC tablet Take 1 tablet (5 mg total) by mouth daily as needed for moderate constipation. (Patient not taking: Reported on 08/30/2017)   . cephALEXin (KEFLEX) 500 MG capsule Take 500 mg by mouth 3 (three) times daily.   . Cholecalciferol (VITAMIN D-3 PO) Take by mouth.   . diphenhydramine-acetaminophen (TYLENOL PM) 25-500 MG TABS tablet 1-1/2 tablets at bedtime.   Marland Kitchen HYDROcodone-acetaminophen (NORCO/VICODIN) 5-325 MG tablet Take 1-2 tablets by mouth every 4  (four) hours as needed for moderate pain. (Patient not taking: Reported on 08/30/2017)   . lovastatin (MEVACOR) 40 MG tablet Take by mouth at bedtime.   . methocarbamol (ROBAXIN) 500 MG tablet Take 1 tablet (500 mg total) by mouth every 6 (six) hours as needed for muscle spasms. (Patient not taking: Reported on 08/30/2017)   . Multiple Vitamin (MULTIVITAMIN) capsule Take 1 capsule by mouth daily.   . pantoprazole (PROTONIX) 40 MG tablet TAKE 1 TABLET BY MOUTH EVERY DAY   . terazosin (HYTRIN) 2 MG capsule Take 2 mg by mouth at bedtime. 2TABS DAILY   . vitamin B-12 (CYANOCOBALAMIN) 1000 MCG tablet Take 1,000 mcg by mouth daily.   Marland Kitchen warfarin (COUMADIN) 2 MG tablet Take 4 mg by mouth daily. 2 TABS DAILY  08/30/2017: Currently taking '3mg'$  daily   No facility-administered encounter medications on file as of 10/28/2017.     Functional Status:   In your present state of health, do you have any difficulty performing the following activities: 09/03/2017 07/15/2017  Hearing? Y N  Vision? N N  Difficulty concentrating or making decisions? N N  Walking or climbing stairs? Y N  Comment unable, home health PT working with patient  -  Dressing or bathing? Y N  Comment wife helps  -  Doing errands, shopping? Y N  Comment wife drives, decreased mobility at this time  -  Conservation officer, nature and eating ? N -  Comment wife assist  -  Using the Toilet? Y -  Comment assist from wife  -  In the past six months, have you accidently leaked urine? Y -  Do you have problems with loss of bowel control? N -  Managing your Medications? Y -  Comment wife helps  -  Managing your Finances? N -  Housekeeping or managing your Housekeeping? N -  Comment wife does house work.  -  Some recent data might be hidden    Fall/Depression Screening:    Fall Risk  09/03/2017  Falls in the past year? Yes  Number falls in past yr: 2 or more  Injury with Fall? Yes  Risk Factor Category  High Fall Risk  Risk for fall due to : History of  fall(s);Impaired balance/gait  Follow up Falls prevention discussed   PHQ 2/9 Scores 09/05/2017  PHQ - 2 Score 0    Assessment:  Routine home visit, wife present.    Falls/ Right Pubic rami fracture ,  Progressing with walking, in home still using rolling walker, able to tolerate walking in home , to bathroom. Patient now has a ramp at home. Patient denies pain in hip area.   Bilateral Heel wounds Continues follow up at wound center and biweekly home health RN visit for wound care, wife reports changing dressing other days.  Planned follow up at wound center every 2 weeks. Patient wearing darco type shoe to limit pressure on heels when walking. Patient continues sleeping in recliner chair to keeping pressure off heels,  due to not liking to wear prevalon type boot while in bed.   Patient has PCP visit scheduled in the next week, to office today for lab work. Wife reports managing well with using ramp and getting patient to the car.    Plan:  Will plan follow up call in the next month.   [facility] CM Care Plan Problem One     Most Recent Value  Care Plan Problem One  Recent admission for pelvic fracture.  Role Documenting the Problem One  Care Management [place] for Problem One  Active  [facility] Long Term Goal   Patient will not experience a [name] admission in the next 90 days  [goal updated ]  [facility] Long Term Goal Start Date  08/30/17  Interventions for Problem One Long Term Goal  RN discussed importance of attending PCP visit, reinforced notifying RN , MD of noted changes in wound , reviewed signs of infection.   [facility] CM Short Term Goal #1   Patient will report following up with primary MD in the next 2 weeks.   [facility] CM Short Term Goal #1 Start Date  08/30/17  [facility] CM Short Term Goal #2   Patient reports that he has decreased weakness in the next 30 days.   [facility]N CM Short Term Goal #2 Start Date  09/27/17  [facility] [name] CM Short Term Goal #2 Met Date  10/28/17  [facility]N CM Short Term Goal #3   Patient will be able to report healing to wound in the next 30 days   [facility]N CM Short Term Goal #3 Start Date  10/18/17 [goal date adjusted ]  Interventions for Short Tern Goal #3  RN reviewed importance of nutrition in wound healing and encouraged continued , nutriton supplment drink of ensure or boost daily   [facility]N CM Short Term Goal #4  Patient will not experience a fall in the next 28 days   [facility]N CM Short Term Goal #4 Start Date  10/28/17  Interventions for Short Term Goal #4  RN reviewed importance of  always using rolling walker, standing a few seconds upon rising before starting to walk, reinforced continued excercise plan by home health PT.      Joylene Draft, RN, [place] Management Coordinator  [phone]- Mobile [phone]- [place] Office

## 2017-10-30 NOTE — Progress Notes (Signed)
[name] ([id number]) Visit Report for 10/25/2017 Arrival Information Details Patient Name: [name] [name] Date of Service: 10/25/2017 1:00 PM Medical Record Number: [id number] Patient Account Number: [id number] Date of Birth/Sex: [birth year] (84 y.o. M) Treating RN: Ahmed Prima Primary Care Manual Navarra: Ramonita Lab Other Clinician: Referring Lamae Fosco: Ramonita Lab Treating Teonia Yager/Extender: Melburn Hake, HOYT Weeks in Treatment: 9 Visit Information History Since Last Visit All ordered tests and consults were completed: No Patient Arrived: Wheel Chair Added or deleted any medications: No Arrival Time: 12:51 Any new allergies or adverse reactions: No Accompanied By: spouse Had a fall or experienced change in No Transfer Assistance: EasyPivot Patient activities of daily living that may affect Lift risk of falls: Patient Identification Verified: Yes Signs or symptoms of abuse/neglect since last visito No Secondary Verification Process Yes Hospitalized since last visit: No Completed: Implantable device outside of the clinic excluding No Patient Requires Transmission-Based No cellular tissue based products placed in the center Precautions: since last visit: Patient Has Alerts: Yes Has Dressing in Place as Prescribed: Yes Patient Alerts: Patient on Blood Pain Present Now: No Thinner Warfarin Electronic Signature(s) Signed: 10/25/2017 4:41:07 PM By: Alric Quan Entered By: Alric Quan on 10/25/2017 12:51:50 [name] ([id number]) -------------------------------------------------------------------------------- Encounter Discharge Information Details Patient Name: [name] [name] Date of Service: 10/25/2017 1:00 PM Medical Record Number: [id number] Patient Account Number: [id number] Date of Birth/Sex: [birth year] (84 y.o. M) Treating RN: Montey Hora Primary Care Consuella Scurlock: Ramonita Lab Other Clinician: Referring Aribelle Mccosh: Ramonita Lab Treating  Alta Shober/Extender: Melburn Hake, HOYT Weeks in Treatment: 9 Encounter Discharge Information Items Discharge Pain Level: 0 Discharge Condition: Stable Ambulatory Status: Wheelchair Discharge Destination: Home Transportation: Private Auto Accompanied By: spouse Schedule Follow-up Appointment: Yes Medication Reconciliation completed and No provided to Patient/Care Gilmer Kaminsky: Provided on Clinical Summary of Care: 10/25/2017 Form Type Recipient Paper Patient [facility] Electronic Signature(s) Signed: 10/25/2017 4:41:07 PM By: Alric Quan Entered By: Alric Quan on 10/25/2017 13:42:30 [name] ([id number]) -------------------------------------------------------------------------------- Lower Extremity Assessment Details Patient Name: [name] Date of Service: 10/25/2017 1:00 PM Medical Record Number: [id number] Patient Account Number: [id number] Date of Birth/Sex: January 09, 1926 (84 y.o. M) Treating RN: Ahmed Prima Primary Care Jyles Sontag: Ramonita Lab Other Clinician: Referring Elijah Phommachanh: Ramonita Lab Treating Wrigley Winborne/Extender: Melburn Hake, HOYT Weeks in Treatment: 9 Vascular Assessment Pulses: Dorsalis Pedis Palpable: [Left:Yes] [Right:Yes] Posterior Tibial Extremity colors, hair growth, and conditions: Extremity Color: [Left:Normal] [Right:Normal] Temperature of Extremity: [Left:Warm] [Right:Warm] Capillary Refill: [Left:< 3 seconds] [Right:< 3 seconds] Toe Nail Assessment Left: Right: Thick: Yes Yes Discolored: Yes Yes Deformed: Yes Yes Improper Length and Hygiene: Yes Yes Electronic Signature(s) Signed: 10/25/2017 4:41:07 PM By: Alric Quan Entered By: Alric Quan on 10/25/2017 13:06:50 [name] ([id number]) -------------------------------------------------------------------------------- Multi Wound Chart Details Patient Name: [name] Date of Service: 10/25/2017 1:00 PM Medical Record Number: [id number] Patient Account Number:  [id number] Date of Birth/Sex: [birth year] (84 y.o. M) Treating RN: Montey Hora Primary Care Jhamir Pickup: Ramonita Lab Other Clinician: Referring Rosalie Buenaventura: Ramonita Lab Treating Isa Kohlenberg/Extender: Melburn Hake, HOYT Weeks in Treatment: 9 Vital Signs Height(in): 70 Pulse(bpm): 78 Weight(lbs): 190 Blood Pressure(mmHg): 105/49 Body Mass Index(BMI): 27 Temperature(F): 98.0 Respiratory Rate 16 (breaths/min): Photos: [1:No Photos] [2:No Photos] [3:No Photos] Wound Location: [1:Left Calcaneus] [2:Right Calcaneus] [3:Toe Great] Wounding Event: [1:Pressure Injury] [2:Pressure Injury] [3:Pressure Injury] Primary Etiology: [1:Pressure Ulcer] [2:Pressure Ulcer] [3:Pressure Ulcer] Comorbid History: [1:Arrhythmia, Hypertension, Received Radiation] [2:Arrhythmia, Hypertension, Received Radiation] [3:Arrhythmia, Hypertension, Received Radiation] Date Acquired: [1:08/12/2017] [2:08/12/2017] [3:09/02/2017] Weeks of Treatment: [1:9] [2:9] [3:6] Wound Status: [1:Open] [2:Open] [  3:Open] Measurements [name] [1:3.5x3.8x0.2] [2:2.1x2.8x0.2] [3:0.2x0.2x0.1] (cm) Area (cm) : [1:10.446] [2:4.618] [3:0.031] Volume (cm) : [1:2.089] [2:0.924] [3:0.003] % Reduction in Area: [1:54.40%] [2:59.80%] [3:84.20%] % Reduction in Volume: [1:54.40%] [2:59.80%] [3:85.00%] Classification: [1:Unstageable/Unclassified] [2:Unstageable/Unclassified] [3:Category/Stage II] Exudate Amount: [1:Large] [2:Large] [3:Small] Exudate Type: [1:Serous] [2:Serous] [3:Serosanguineous] Exudate Color: [1:amber] [2:amber] [3:red, brown] Wound Margin: [1:Distinct, outline attached] [2:Thickened] [3:Flat and Intact] Granulation Amount: [1:Small (1-33%)] [2:None Present (0%)] [3:None Present (0%)] Granulation Quality: [1:Red, Hyper-granulation] [2:N/A] [3:N/A] Necrotic Amount: [1:Large (67-100%)] [2:Large (67-100%)] [3:Large (67-100%)] Necrotic Tissue: [1:Eschar] [2:Eschar] [3:Eschar] Exposed Structures: [1:Fascia: No Fat Layer (Subcutaneous  Tissue) Exposed: No Tendon: No Muscle: No Joint: No Bone: No] [2:Fascia: No Fat Layer (Subcutaneous Tissue) Exposed: No Tendon: No Muscle: No Joint: No Bone: No] [3:Fascia: No Fat Layer (Subcutaneous Tissue) Exposed:  No Tendon: No Muscle: No Joint: No Bone: No] Epithelialization: [1:None] [2:None] [3:Medium (34-66%)] Periwound Skin Texture: [1:No Abnormalities Noted] [2:No Abnormalities Noted] [3:Excoriation: No Induration: No Callus: No Crepitus: No] Rash: No Scarring: No Periwound Skin Moisture: Dry/Scaly: Yes Dry/Scaly: Yes Maceration: No Dry/Scaly: No Periwound Skin Color: No Abnormalities Noted No Abnormalities Noted Atrophie Blanche: No Cyanosis: No Ecchymosis: No Erythema: No Hemosiderin Staining: No Mottled: No Pallor: No Rubor: No Temperature: No Abnormality No Abnormality No Abnormality Tenderness on Palpation: Yes Yes No Wound Preparation: Ulcer Cleansing: Ulcer Cleansing: Ulcer Cleansing: Rinsed/Irrigated with Saline Rinsed/Irrigated with Saline Rinsed/Irrigated with Saline Topical Anesthetic Applied: Topical Anesthetic Applied: Topical Anesthetic Applied: Other: lidocaine 4% Other: lidocaine 4% Other: lidocAINE 4% Treatment Notes Electronic Signature(s) Signed: 10/25/2017 3:07:03 PM By: Montey Hora Entered By: Montey Hora on 10/25/2017 13:15:54 [name] ([id number]) -------------------------------------------------------------------------------- [place] Details Patient Name: [name] [name] Date of Service: 10/25/2017 1:00 PM Medical Record Number: [id number] Patient Account Number: [id number] Date of Birth/Sex: 1925/08/22 (84 y.o. M) Treating RN: Montey Hora Primary Care Carlin Mamone: Ramonita Lab Other Clinician: Referring Nic Lampe: Ramonita Lab Treating Koal Eslinger/Extender: Melburn Hake, HOYT Weeks in Treatment: 9 Active Inactive ` Abuse / Safety / Falls / Self Care Management Nursing Diagnoses: History of Falls Potential  for falls Goals: Patient will not experience any injury related to falls Date Initiated: 08/19/2017 Target Resolution Date: 12/14/2017 Goal Status: Active Interventions: Assess Activities of Daily Living upon admission and as needed Assess fall risk on admission and as needed Assess: immobility, friction, shearing, incontinence upon admission and as needed Assess impairment of mobility on admission and as needed per policy Assess personal safety and home safety (as indicated) on admission and as needed Assess self care needs on admission and as needed Notes: ` Nutrition Nursing Diagnoses: Imbalanced nutrition Potential for alteratiion in Nutrition/Potential for imbalanced nutrition Goals: Patient/caregiver agrees to and verbalizes understanding of need to use nutritional supplements and/or vitamins as prescribed Date Initiated: 08/19/2017 Target Resolution Date: 11/16/2017 Goal Status: Active Interventions: Assess patient nutrition upon admission and as needed per policy Notes: ` Orientation to the Wound Care Program Nursing Diagnoses: [name] ([id number]) Knowledge deficit related to the wound healing center program Goals: Patient/caregiver will verbalize understanding of the [place] Date Initiated: 08/19/2017 Target Resolution Date: 09/14/2017 Goal Status: Active Interventions: Provide education on orientation to the wound center Notes: ` Pain, Acute or Chronic Nursing Diagnoses: Pain, acute or chronic: actual or potential Potential alteration in comfort, pain Goals: Patient/caregiver will verbalize adequate pain control between visits Date Initiated: 08/19/2017 Target Resolution Date: 12/14/2017 Goal Status: Active Interventions: Complete pain assessment as per visit requirements Notes: ` Pressure Nursing Diagnoses: Knowledge deficit  related to causes and risk factors for pressure ulcer development Knowledge deficit related to management  of pressures ulcers Potential for impaired tissue integrity related to pressure, friction, moisture, and shear Goals: Patient will remain free from development of additional pressure ulcers Date Initiated: 08/19/2017 Target Resolution Date: 12/14/2017 Goal Status: Active Interventions: Assess: immobility, friction, shearing, incontinence upon admission and as needed Assess potential for pressure ulcer upon admission and as needed Notes: ` Wound/Skin Impairment Nursing Diagnoses: Impaired tissue integrity Knowledge deficit related to ulceration/compromised skin integrity [name] ([id number]) Goals: Ulcer/skin breakdown will have a volume reduction of 80% by week 12 Date Initiated: 08/19/2017 Target Resolution Date: 12/14/2017 Goal Status: Active Interventions: Assess patient/caregiver ability to perform ulcer/skin care regimen upon admission and as needed Assess ulceration(s) every visit Notes: Electronic Signature(s) Signed: 10/25/2017 3:07:03 PM By: Montey Hora Entered By: Montey Hora on 10/25/2017 13:15:41 [name] ([id number]) -------------------------------------------------------------------------------- Pain Assessment Details Patient Name: [name] Date of Service: 10/25/2017 1:00 PM Medical Record Number: [id number] Patient Account Number: [id number] Date of Birth/Sex: [birth year] (84 y.o. M) Treating RN: Ahmed Prima Primary Care Malaak Stach: Ramonita Lab Other Clinician: Referring Machaela Caterino: Ramonita Lab Treating Bernhardt Riemenschneider/Extender: Melburn Hake, HOYT Weeks in Treatment: 9 Active Problems Location of Pain Severity and Description of Pain Patient Has Paino No Site Locations Pain Management and Medication Current Pain Management: Electronic Signature(s) Signed: 10/25/2017 4:41:07 PM By: Alric Quan Entered By: Alric Quan on 10/25/2017 12:51:56 [name]  ([id number]) -------------------------------------------------------------------------------- Patient/Caregiver Education Details Patient Name: GRAESON, NOURI [name]. Date of Service: 10/25/2017 1:00 PM Medical Record Number: [id number] Patient Account Number: [id number] Date of Birth/Gender: 08/05/1925 (84 y.o. M) Treating RN: Ahmed Prima Primary Care Physician: Ramonita Lab Other Clinician: Referring Physician: Ramonita Lab Treating Physician/Extender: Sharalyn Ink in Treatment: 9 Education Assessment Education Provided To: Patient Education Topics Provided Wound/Skin Impairment: Handouts: Caring for Your Ulcer, Other: change dressing as ordered Methods: Demonstration, Explain/Verbal Responses: State content correctly Electronic Signature(s) Signed: 10/25/2017 4:41:07 PM By: Alric Quan Entered By: Alric Quan on 10/25/2017 13:42:46 Huneke, Dorsie [name]. ([id number]) -------------------------------------------------------------------------------- Wound Assessment Details Patient Name: [name] Date of Service: 10/25/2017 1:00 PM Medical Record Number: [id number] Patient Account Number: [id number] Date of Birth/Sex: 05/25/1926 (84 y.o. M) Treating RN: Ahmed Prima Primary Care Shanon Seawright: Ramonita Lab Other Clinician: Referring Rhandi Despain: Ramonita Lab Treating Sabryn Preslar/Extender: Melburn Hake, HOYT Weeks in Treatment: 9 Wound Status Wound Number: 1 Primary Etiology: Pressure Ulcer Wound Location: Left Calcaneus Wound Status: Open Wounding Event: Pressure Injury Comorbid Arrhythmia, Hypertension, Received History: Radiation Date Acquired: 08/12/2017 Weeks Of Treatment: 9 Clustered Wound: No Photos Photo Uploaded By: Gretta Cool, BSN, RN, CWS, Kim on 10/29/2017 17:12:38 Wound Measurements Length: (cm) 3.5 Width: (cm) 3.8 Depth: (cm) 0.2 Area: (cm) 10.446 Volume: (cm) 2.089 % Reduction in Area: 54.4% % Reduction in Volume: 54.4% Epithelialization:  None Tunneling: No Undermining: No Wound Description Classification: Unstageable/Unclassified Wound Margin: Distinct, outline attached Exudate Amount: Large Exudate Type: Serous Exudate Color: amber Foul Odor After Cleansing: No Slough/Fibrino Yes Wound Bed Granulation Amount: Small (1-33%) Exposed Structure Granulation Quality: Red, Hyper-granulation Fascia Exposed: No Necrotic Amount: Large (67-100%) Fat Layer (Subcutaneous Tissue) Exposed: No Necrotic Quality: Eschar Tendon Exposed: No Muscle Exposed: No Joint Exposed: No Bone Exposed: No Periwound Skin Texture Texture Color No Abnormalities Noted: No No Abnormalities Noted: No Moisture Temperature / Pain Corpus, [name] ([id number]) No Abnormalities Noted: No Temperature: No Abnormality Dry / Scaly: Yes Tenderness on Palpation: Yes Wound Preparation Ulcer Cleansing: Rinsed/Irrigated with Saline Topical  Anesthetic Applied: Other: lidocaine 4%, Treatment Notes Wound #1 (Left Calcaneus) 1. Cleansed with: Clean wound with Normal Saline 2. Anesthetic Topical Lidocaine 4% cream to wound bed prior to debridement 3. Peri-wound Care: Skin Prep 4. Dressing Applied: Iodoflex 5. Secondary Dressing Applied Bordered Foam Dressing Notes netting Electronic Signature(s) Signed: 10/25/2017 4:41:07 PM By: Alric Quan Entered By: Alric Quan on 10/25/2017 13:05:32 Mears, [place] ([id number]) -------------------------------------------------------------------------------- Wound Assessment Details Patient Name: CANSLER, Shigeo [name]. Date of Service: 10/25/2017 1:00 PM Medical Record Number: [id number] Patient Account Number: [id number] Date of Birth/Sex: 1926-06-20 (84 y.o. M) Treating RN: Ahmed Prima Primary Care Desirie Minteer: Ramonita Lab Other Clinician: Referring Myshawn Chiriboga: Ramonita Lab Treating Laqueena Hinchey/Extender: Melburn Hake, HOYT Weeks in Treatment: 9 Wound Status Wound Number: 2 Primary Etiology: Pressure  Ulcer Wound Location: Right Calcaneus Wound Status: Open Wounding Event: Pressure Injury Comorbid Arrhythmia, Hypertension, Received History: Radiation Date Acquired: 08/12/2017 Weeks Of Treatment: 9 Clustered Wound: No Photos Photo Uploaded By: Gretta Cool, BSN, RN, CWS, Kim on 10/29/2017 17:12:38 Wound Measurements Length: (cm) 2.1 Width: (cm) 2.8 Depth: (cm) 0.2 Area: (cm) 4.618 Volume: (cm) 0.924 % Reduction in Area: 59.8% % Reduction in Volume: 59.8% Epithelialization: None Tunneling: No Undermining: No Wound Description Classification: Unstageable/Unclassified Wound Margin: Thickened Exudate Amount: Large Exudate Type: Serous Exudate Color: amber Foul Odor After Cleansing: No Slough/Fibrino Yes Wound Bed Granulation Amount: None Present (0%) Exposed Structure Necrotic Amount: Large (67-100%) Fascia Exposed: No Necrotic Quality: Eschar Fat Layer (Subcutaneous Tissue) Exposed: No Tendon Exposed: No Muscle Exposed: No Joint Exposed: No Bone Exposed: No Periwound Skin Texture Texture Color No Abnormalities Noted: No No Abnormalities Noted: No Moisture Temperature / Pain Keinath, Ferdinando [name]. ([id number]) No Abnormalities Noted: No Temperature: No Abnormality Dry / Scaly: Yes Tenderness on Palpation: Yes Wound Preparation Ulcer Cleansing: Rinsed/Irrigated with Saline Topical Anesthetic Applied: Other: lidocaine 4%, Treatment Notes Wound #2 (Right Calcaneus) 1. Cleansed with: Clean wound with Normal Saline 2. Anesthetic Topical Lidocaine 4% cream to wound bed prior to debridement 3. Peri-wound Care: Skin Prep 4. Dressing Applied: Iodoflex 5. Secondary Dressing Applied Bordered Foam Dressing Notes netting Electronic Signature(s) Signed: 10/25/2017 4:41:07 PM By: Alric Quan Entered By: Alric Quan on 10/25/2017 13:05:53 Spizzirri, Alson [name]. ([id number]) -------------------------------------------------------------------------------- Wound Assessment  Details Patient Name: ARVIE, Nicoles [name]. Date of Service: 10/25/2017 1:00 PM Medical Record Number: [id number] Patient Account Number: [id number] Date of Birth/Sex: 1925-12-31 (84 y.o. M) Treating RN: Ahmed Prima Primary Care Jersey Espinoza: Ramonita Lab Other Clinician: Referring Jeovany Huitron: Ramonita Lab Treating Yvone Slape/Extender: Melburn Hake, HOYT Weeks in Treatment: 9 Wound Status Wound Number: 3 Primary Etiology: Pressure Ulcer Wound Location: Toe Great Wound Status: Open Wounding Event: Pressure Injury Comorbid Arrhythmia, Hypertension, Received History: Radiation Date Acquired: 09/02/2017 Weeks Of Treatment: 6 Clustered Wound: No Photos Photo Uploaded By: Gretta Cool, BSN, RN, CWS, Kim on 10/29/2017 17:12:54 Wound Measurements Length: (cm) 0.2 Width: (cm) 0.2 Depth: (cm) 0.1 Area: (cm) 0.031 Volume: (cm) 0.003 % Reduction in Area: 84.2% % Reduction in Volume: 85% Epithelialization: Medium (34-66%) Tunneling: No Undermining: No Wound Description Classification: Category/Stage II Foul Odor Wound Margin: Flat and Intact Slough/Fi Exudate Amount: Small Exudate Type: Serosanguineous Exudate Color: red, brown After Cleansing: No brino Yes Wound Bed Granulation Amount: None Present (0%) Exposed Structure Necrotic Amount: Large (67-100%) Fascia Exposed: No Necrotic Quality: Eschar Fat Layer (Subcutaneous Tissue) Exposed: No Tendon Exposed: No Muscle Exposed: No Joint Exposed: No Bone Exposed: No Periwound Skin Texture Texture Color No Abnormalities Noted: No No Abnormalities Noted: No Callus: No Atrophie Blanche: No Gerke, Steffon  [name]. ([id number]) Crepitus: No Cyanosis: No Excoriation: No Ecchymosis: No Induration: No Erythema: No Rash: No Hemosiderin Staining: No Scarring: No Mottled: No Pallor: No Moisture Rubor: No No Abnormalities Noted: No Dry / Scaly: No Temperature / Pain Maceration: No Temperature: No Abnormality Wound Preparation Ulcer Cleansing:  Rinsed/Irrigated with Saline Topical Anesthetic Applied: Other: lidocAINE 4%, Treatment Notes Wound #3 (Toe Great) 1. Cleansed with: Clean wound with Normal Saline 2. Anesthetic Topical Lidocaine 4% cream to wound bed prior to debridement Notes Betadine Electronic Signature(s) Signed: 10/25/2017 4:41:07 PM By: Alric Quan Entered By: Alric Quan on 10/25/2017 13:04:50 Boline, Lathon [name]. ([id number]) -------------------------------------------------------------------------------- Vitals Details Patient Name: SQUILLACE, Nic [name]. Date of Service: 10/25/2017 1:00 PM Medical Record Number: [id number] Patient Account Number: [id number] Date of Birth/Sex: October 02, 1925 (84 y.o. M) Treating RN: Ahmed Prima Primary Care Carlyann Placide: Ramonita Lab Other Clinician: Referring Cariann Kinnamon: Ramonita Lab Treating Jeannifer Drakeford/Extender: Melburn Hake, HOYT Weeks in Treatment: 9 Vital Signs Time Taken: 12:52 Temperature (F): 98.0 Height (in): 70 Pulse (bpm): 78 Weight (lbs): 190 Respiratory Rate (breaths/min): 16 Body Mass Index (BMI): 27.3 Blood Pressure (mmHg): 105/49 Reference Range: 80 - 120 mg / dl Electronic Signature(s) Signed: 10/25/2017 4:41:07 PM By: Alric Quan Entered By: Alric Quan on 10/25/2017 12:53:43

## 2017-11-01 DIAGNOSIS — L89610 Pressure ulcer of right heel, unstageable: Secondary | ICD-10-CM | POA: Diagnosis not present

## 2017-11-01 DIAGNOSIS — L89620 Pressure ulcer of left heel, unstageable: Secondary | ICD-10-CM | POA: Diagnosis not present

## 2017-11-01 NOTE — Progress Notes (Signed)
[name], [name] ([id number]) Visit Report for 10/25/2017 Chief Complaint Document Details Patient Name: [name], [name] Date of Service: 10/25/2017 1:00 PM Medical Record Number: [id number] Patient Account Number: [id number] Date of Birth/Sex: [birth year] (84 y.o. M) Treating RN: Montey Hora Primary Care Provider: Ramonita Lab Other Clinician: Referring Provider: Ramonita Lab Treating Provider/Extender: Melburn Hake, HOYT Weeks in Treatment: 9 Information Obtained from: Patient Chief Complaint Bilateral Heel pressure ulcers and bilateral great toe DTIs Electronic Signature(s) Signed: 10/25/2017 5:53:05 PM By: Worthy Keeler PA-C Entered By: Worthy Keeler on 10/25/2017 13:10:00 Aikey, Izaiha L. ([id number]) -------------------------------------------------------------------------------- Debridement Details Patient Name: [name], [name] Date of Service: 10/25/2017 1:00 PM Medical Record Number: [id number] Patient Account Number: [id number] Date of Birth/Sex: [birth year] (84 y.o. M) Treating RN: Montey Hora Primary Care Provider: Ramonita Lab Other Clinician: Referring Provider: Ramonita Lab Treating Provider/Extender: Melburn Hake, HOYT Weeks in Treatment: 9 Debridement Performed for Wound #1 Left Calcaneus Assessment: Performed By: Physician STONE III, HOYT E., PA-C Debridement Type: Debridement Pre-procedure Verification/Time Yes - 13:22 Out Taken: Start Time: 13:22 Pain Control: Lidocaine 4% Topical Solution Total Area Debrided (L x W): 3.5 (cm) x 3.8 (cm) = 13.3 (cm) Tissue and other material Viable, Non-Viable, Eschar, Slough, Subcutaneous, Slough debrided: Level: Skin/Subcutaneous Tissue Debridement Description: Excisional Instrument: Forceps, Scissors Bleeding: Minimum Hemostasis Achieved: Pressure End Time: 13:29 Procedural Pain: 0 Post Procedural Pain: 0 Response to Treatment: Procedure was tolerated well Post Debridement Measurements of Total Wound Length: (cm)  3.8 Stage: Category/Stage III Width: (cm) 3.6 Depth: (cm) 0.3 Volume: (cm) 3.223 Character of Wound/Ulcer Post Improved Debridement: Post Procedure Diagnosis Same as Pre-procedure Electronic Signature(s) Signed: 10/25/2017 3:07:03 PM By: Montey Hora Signed: 10/25/2017 5:53:05 PM By: Worthy Keeler PA-C Entered By: Montey Hora on 10/25/2017 13:31:26 [name], [name] ([id number]) -------------------------------------------------------------------------------- Debridement Details Patient Name: [name], [name] Date of Service: 10/25/2017 1:00 PM Medical Record Number: [id number] Patient Account Number: [id number] Date of Birth/Sex: [birth year] (84 y.o. M) Treating RN: Montey Hora Primary Care Provider: Ramonita Lab Other Clinician: Referring Provider: Ramonita Lab Treating Provider/Extender: Melburn Hake, HOYT Weeks in Treatment: 9 Debridement Performed for Wound #2 Right Calcaneus Assessment: Performed By: Physician STONE III, HOYT E., PA-C Debridement Type: Debridement Pre-procedure Verification/Time Yes - 13:18 Out Taken: Start Time: 13:18 Pain Control: Lidocaine 4% Topical Solution Total Area Debrided (L x W): 2.1 (cm) x 2.8 (cm) = 5.88 (cm) Tissue and other material Viable, Non-Viable, Eschar, Slough, Subcutaneous, Slough debrided: Level: Skin/Subcutaneous Tissue Debridement Description: Excisional Instrument: Forceps, Scissors Bleeding: Minimum Hemostasis Achieved: Pressure End Time: 13:22 Procedural Pain: 0 Post Procedural Pain: 0 Response to Treatment: Procedure was tolerated well Post Debridement Measurements of Total Wound Length: (cm) 2.4 Stage: Unstageable/Unclassified Width: (cm) 2.6 Depth: (cm) 0.2 Volume: (cm) 0.98 Character of Wound/Ulcer Post Improved Debridement: Post Procedure Diagnosis Same as Pre-procedure Electronic Signature(s) Signed: 10/25/2017 3:07:03 PM By: Montey Hora Signed: 10/25/2017 5:53:05 PM By: Worthy Keeler  PA-C Entered By: Montey Hora on 10/25/2017 13:32:59 [name], [name] ([id number]) -------------------------------------------------------------------------------- HPI Details Patient Name: [name], [name] Date of Service: 10/25/2017 1:00 PM Medical Record Number: [id number] Patient Account Number: [id number] Date of Birth/Sex: 06-Jan-1926 (84 y.o. M) Treating RN: Montey Hora Primary Care Provider: Ramonita Lab Other Clinician: Referring Provider: Ramonita Lab Treating Provider/Extender: Melburn Hake, HOYT Weeks in Treatment: 9 History of Present Illness Associated Signs and Symptoms: Patient has a history of chronic atrophic relation, hypertension, and long-term use of anticoagulants. He also had a fall with pelvic fracture which occurred on January  2019. HPI Description: 08/19/17 patient presents today for initial evaluation and our clinic concerning issues he has been having with the bilateral heels as well as the lateral great toe was since he was admitted to the nursing facility following a pelvic fracture. Initially he was spending a significant amount of time in the bed although he is now up more often since the healing process has progressed along the way obviously. When he is up he's spending most of the time sitting in a wheelchair at this point. When he is in bed he lays on his back and the covers/sheets are pushing down on his toes bilaterally which I think it may be what's causing the issues with his deep tissue injury to the bilateral great toes. With that being said the hills also appear to be pressure in nature and I think that laying in the bed getting pressure to the sites is what has led to the formation of these on stage will pressure ulcers. Fortunately there does not appear to be any infection in both areas of ulceration of the heel appear to be stable at this point. Patient does not have any significant pain at the site she does have some of the eschar along the edges  which is starting to lift up and come all hopefully slowly but surely this will occur. Patient did have arterial studies performed April 2018 which showed a normal TBI on the right knee slightly depressed TBI on the left but this does not appear to have changed significantly at that point. Specifically this was 0.75 on the right and 0.61 on the left. 09/13/17 on evaluation today patient presents for a follow-up evaluation regarding his bilateral heal ulcers. He has been tolerating the dressing changes fairly well although he did have some bleeding earlier today when it appears some of the skin on the surrounding portion of the wound got pulled where this is starting to flake off. Subsequently there does not appear to be any bleeding right now. Since I last saw him he has been discharged from the facility and is now back at home. His wife is having a very difficult time being able to get him up and in the car in order to come for an appointment which is why they missed the last appointment have not been able to come back sooner. Fortunately the he does not have any signs of infection. No fevers chills noted. He does have difficulty walking due to pain in the bilateral heels. 10/11/17 on evaluation today patient presents for follow-up evaluation concerning his ongoing bilateral heal ulcers. He has been tolerating the dressing changes which were Betadine paints daily without complication. I have not seen him since September 13, 2017. Pressure that I saw him August 19, 2017. Subsequently those are the only prior visits before today. His left heel actually seems to have a significant amount of eschar that is starting to lift up at this point and seems to be a little more loose as far as drainage building up underneath the eschar. The right eschar though there are some parts lifting up seems to still be fairly stable which is good news. His toe ulcer is almost completely healed a lot of that eschar came off and  it looks very well underneath. 10/25/17 on evaluation today patient presents today regarding his bilateral heal ulcers in his right great toe ulcer. At this point his heels actually have loosened and softened to the point that I think we do need to  remove these at this time. With that being said I think this will actually help them to heal much better and much more quickly currently. With that being said patient was a little bit worried about the debridement as far as pain was concerned fortunately this seemed to go very well today. Electronic Signature(s) Signed: 10/25/2017 5:53:05 PM By: Worthy Keeler PA-C Entered By: Worthy Keeler on 10/25/2017 17:29:23 [name], [name] ([id number]) -------------------------------------------------------------------------------- Physical Exam Details Patient Name: [name], [name] Date of Service: 10/25/2017 1:00 PM Medical Record Number: [id number] Patient Account Number: [id number] Date of Birth/Sex: 05-25-1926 (84 y.o. M) Treating RN: Montey Hora Primary Care Provider: Ramonita Lab Other Clinician: Referring Provider: Ramonita Lab Treating Provider/Extender: Melburn Hake, HOYT Weeks in Treatment: 9 Constitutional Well-nourished and well-hydrated in no acute distress. Respiratory normal breathing without difficulty. Psychiatric this patient is able to make decisions and demonstrates good insight into disease process. Alert and Oriented x 3. pleasant and cooperative. Notes I did actually perform debridement today utilizing scissors and forceps along with a curette once the eschar was removed to clean out some of the necrotic tissue. He tolerated all of this with minimal discomfort at this point and in fact it was only on the left foot towards the end when I was the breeding utilizing a curette that he had any issues whatsoever. However post debridement the wound did appear to be much better and I think will do much better on going. Electronic  Signature(s) Signed: 10/25/2017 5:53:05 PM By: Worthy Keeler PA-C Entered By: Worthy Keeler on 10/25/2017 17:30:07 [name], [name] Water ([id number]) -------------------------------------------------------------------------------- Physician Orders Details Patient Name: [name], [name] L. Date of Service: 10/25/2017 1:00 PM Medical Record Number: [id number] Patient Account Number: [id number] Date of Birth/Sex: [birth year] (84 y.o. M) Treating RN: Montey Hora Primary Care Provider: Ramonita Lab Other Clinician: Referring Provider: Ramonita Lab Treating Provider/Extender: Melburn Hake, HOYT Weeks in Treatment: 9 Verbal / Phone Orders: No Diagnosis Coding ICD-10 Coding Code Description L89.620 Pressure ulcer of left heel, unstageable L89.610 Pressure ulcer of right heel, unstageable I48.2 Chronic atrial fibrillation I10 Essential (primary) hypertension Z79.01 Long term (current) use of anticoagulants Wound Cleansing Wound #1 Left Calcaneus o Clean wound with Normal Saline. o Cleanse wound with mild soap and water o May Shower, gently pat wound dry prior to applying new dressing. Wound #2 Right Calcaneus o Clean wound with Normal Saline. o Cleanse wound with mild soap and water o May Shower, gently pat wound dry prior to applying new dressing. Wound #3 Toe Great o Clean wound with Normal Saline. o Cleanse wound with mild soap and water o May Shower, gently pat wound dry prior to applying new dressing. Primary Wound Dressing Wound #1 Left Calcaneus o Iodoflex Wound #2 Right Calcaneus o Iodoflex Wound #3 Toe Great o Other: - provodone-iodine Secondary Dressing Wound #1 Left Calcaneus o Dry Gauze o Foam Wound #2 Right Calcaneus o Dry Gauze o Foam [name], [name] ([id number]) Wound #3 Toe Great o Other - no bandages Dressing Change Frequency Wound #1 Left Calcaneus o Change dressing every day. o Change dressing twice daily. - morning and  evening Wound #2 Right Calcaneus o Change dressing every day. Wound #3 Toe Great o Change dressing every day. Follow-up Appointments Wound #1 Left Calcaneus o Return Appointment in 2 weeks. Wound #2 Right Calcaneus o Return Appointment in 2 weeks. Wound #3 Toe Great o Return Appointment in 2 weeks. Off-Loading Wound #1 Left Calcaneus o Heel suspension boot to: -  Please order prevalon boots for bilateral feet Please float heel when pt is in the bed (NO PRESSURE ON HEELS) o Turn and reposition every 2 hours o Other: - Adjustable Blanket Lift Bar so the sheets and blanket does not put pressure on the toes Wound #2 Right Calcaneus o Heel suspension boot to: - Please order prevalon boots for bilateral feet Please float heel when pt is in the bed (NO PRESSURE ON HEELS) o Turn and reposition every 2 hours o Other: - Adjustable Blanket Lift Bar so the sheets and blanket does not put pressure on the toes Wound #3 Toe Great o Heel suspension boot to: - Please order prevalon boots for bilateral feet Please float heel when pt is in the bed (NO PRESSURE ON HEELS) o Turn and reposition every 2 hours o Other: - Adjustable Blanket Lift Bar so the sheets and blanket does not put pressure on the toes Additional Orders / Instructions Wound #1 Left Calcaneus o Increase protein intake. Wound #2 Right Calcaneus o Increase protein intake. Wound #3 Toe Great o Increase protein intake. [place] ([id number]) Wound #1 Left [place] Nurse may visit PRN to address patientos wound care needs. o FACE TO FACE ENCOUNTER: MEDICARE and MEDICAID PATIENTS: I certify that this patient is under my care and that I had a face-to-face encounter that meets the physician face-to-face encounter requirements with this patient on this date. The encounter with the patient was in whole or in part for the following  MEDICAL CONDITION: (primary reason for [place]) MEDICAL NECESSITY: I certify, that based on my findings, NURSING services are a medically necessary home health service. HOME BOUND STATUS: I certify that my clinical findings support that this patient is homebound (i.e., Due to illness or injury, pt requires aid of supportive devices such as crutches, cane, wheelchairs, walkers, the use of special transportation or the assistance of another person to leave their place of residence. There is a normal inability to leave the home and doing so requires considerable and taxing effort. Other absences are for medical reasons / religious services and are infrequent or of short duration when for other reasons). o If current dressing causes regression in wound condition, may D/C ordered dressing product/s and apply Normal Saline Moist Dressing daily until next [place] / Other MD appointment. [place] of regression in wound condition at [phone]. o Please direct any NON-WOUND related issues/requests for orders to patient's Primary Care Physician Wound #2 Right [place] Nurse may visit PRN to address patientos wound care needs. o FACE TO FACE ENCOUNTER: MEDICARE and MEDICAID PATIENTS: I certify that this patient is under my care and that I had a face-to-face encounter that meets the physician face-to-face encounter requirements with this patient on this date. The encounter with the patient was in whole or in part for the following MEDICAL CONDITION: (primary reason for [place]) MEDICAL NECESSITY: I certify, that based on my findings, NURSING services are a medically necessary home health service. HOME BOUND STATUS: I certify that my clinical findings support that this patient is homebound (i.e., Due to illness or injury, pt requires aid of supportive devices such as crutches, cane, wheelchairs, walkers,  the use of special transportation or the assistance of another person to leave their place of residence. There is a normal inability to leave the home and doing so requires considerable and taxing  effort. Other absences are for medical reasons / religious services and are infrequent or of short duration when for other reasons). o If current dressing causes regression in wound condition, may D/C ordered dressing product/s and apply Normal Saline Moist Dressing daily until next [place] / Other MD appointment. [place] of regression in wound condition at [phone]. o Please direct any NON-WOUND related issues/requests for orders to patient's Primary Care Physician Wound #3 [place] Nurse may visit PRN to address patientos wound care needs. o FACE TO FACE ENCOUNTER: MEDICARE and MEDICAID PATIENTS: I certify that this patient is under my care and that I had a face-to-face encounter that meets the physician face-to-face encounter requirements with this patient on this date. The encounter with the patient was in whole or in part for the following MEDICAL CONDITION: (primary reason for [place]) MEDICAL NECESSITY: I certify, that based on my findings, NURSING services are a medically necessary home health service. HOME BOUND STATUS: I certify that my clinical findings support that this patient is homebound (i.e., Due to illness or injury, pt requires aid of supportive devices such as crutches, cane, wheelchairs, walkers, the use of special transportation or the assistance of another person to leave their place of residence. There is a normal inability to leave the home and doing so requires considerable and taxing effort. Other absences are for medical reasons / religious services and are infrequent or of short duration when for other reasons). o If current dressing causes regression in wound condition,  may D/C ordered dressing product/s and apply Normal Saline Moist Dressing daily until next [place] / Other MD appointment. [place] of regression in wound condition at [phone]. o Please direct any NON-WOUND related issues/requests for orders to patient's Primary Care Physician Electronic Signature(s) Signed: 10/25/2017 3:07:03 PM By: Montey Hora Signed: 10/25/2017 5:53:05 PM By: Worthy Keeler PA-C [place], [place] ([id number]) Entered By: Montey Hora on 10/25/2017 13:34:01 Ficken, [place] ([id number]) -------------------------------------------------------------------------------- Problem List Details Patient Name: [name], [name] Date of Service: 10/25/2017 1:00 PM Medical Record Number: [id number] Patient Account Number: [id number] Date of Birth/Sex: 1925-10-10 (84 y.o. M) Treating RN: Montey Hora Primary Care Provider: Ramonita Lab Other Clinician: Referring Provider: Ramonita Lab Treating Provider/Extender: Melburn Hake, HOYT Weeks in Treatment: 9 Active Problems ICD-10 Impacting Encounter Code Description Active Date Wound Healing Diagnosis L89.620 Pressure ulcer of left heel, unstageable 08/19/2017 Yes L89.610 Pressure ulcer of right heel, unstageable 08/19/2017 Yes I48.2 Chronic atrial fibrillation 08/19/2017 Yes I10 Essential (primary) hypertension 08/19/2017 Yes Z79.01 Long term (current) use of anticoagulants 08/19/2017 Yes Inactive Problems Resolved Problems Electronic Signature(s) Signed: 10/25/2017 5:53:05 PM By: Worthy Keeler PA-C Entered By: Worthy Keeler on 10/25/2017 13:09:42 Back, [place] ([id number]) -------------------------------------------------------------------------------- Progress Note Details Patient Name: Philyaw, [name] Date of Service: 10/25/2017 1:00 PM Medical Record Number: [id number] Patient Account Number: [id number] Date of Birth/Sex: 08-06-1925 (84 y.o. M) Treating RN: Montey Hora Primary  Care Provider: Ramonita Lab Other Clinician: Referring Provider: Ramonita Lab Treating Provider/Extender: Melburn Hake, HOYT Weeks in Treatment: 9 Subjective Chief Complaint Information obtained from Patient Bilateral Heel pressure ulcers and bilateral great toe DTIs History of Present Illness (HPI) The following HPI elements were documented for the patient's wound: Associated Signs and Symptoms: Patient has a history of chronic atrophic relation, hypertension, and long-term use of anticoagulants. He also had a fall with pelvic fracture which occurred on January 2019.  08/19/17 patient presents today for initial evaluation and our clinic concerning issues he has been having with the bilateral heels as well as the lateral great toe was since he was admitted to the nursing facility following a pelvic fracture. Initially he was spending a significant amount of time in the bed although he is now up more often since the healing process has progressed along the way obviously. When he is up he's spending most of the time sitting in a wheelchair at this point. When he is in bed he lays on his back and the covers/sheets are pushing down on his toes bilaterally which I think it may be what's causing the issues with his deep tissue injury to the bilateral great toes. With that being said the hills also appear to be pressure in nature and I think that laying in the bed getting pressure to the sites is what has led to the formation of these on stage will pressure ulcers. Fortunately there does not appear to be any infection in both areas of ulceration of the heel appear to be stable at this point. Patient does not have any significant pain at the site she does have some of the eschar along the edges which is starting to lift up and come all hopefully slowly but surely this will occur. Patient did have arterial studies performed April 2018 which showed a normal TBI on the right knee slightly depressed TBI on the  left but this does not appear to have changed significantly at that point. Specifically this was 0.75 on the right and 0.61 on the left. 09/13/17 on evaluation today patient presents for a follow-up evaluation regarding his bilateral heal ulcers. He has been tolerating the dressing changes fairly well although he did have some bleeding earlier today when it appears some of the skin on the surrounding portion of the wound got pulled where this is starting to flake off. Subsequently there does not appear to be any bleeding right now. Since I last saw him he has been discharged from the facility and is now back at home. His wife is having a very difficult time being able to get him up and in the car in order to come for an appointment which is why they missed the last appointment have not been able to come back sooner. Fortunately the he does not have any signs of infection. No fevers chills noted. He does have difficulty walking due to pain in the bilateral heels. 10/11/17 on evaluation today patient presents for follow-up evaluation concerning his ongoing bilateral heal ulcers. He has been tolerating the dressing changes which were Betadine paints daily without complication. I have not seen him since September 13, 2017. Pressure that I saw him August 19, 2017. Subsequently those are the only prior visits before today. His left heel actually seems to have a significant amount of eschar that is starting to lift up at this point and seems to be a little more loose as far as drainage building up underneath the eschar. The right eschar though there are some parts lifting up seems to still be fairly stable which is good news. His toe ulcer is almost completely healed a lot of that eschar came off and it looks very well underneath. 10/25/17 on evaluation today patient presents today regarding his bilateral heal ulcers in his right great toe ulcer. At this point his heels actually have loosened and softened to the  point that I think we do need to remove these at  this time. With that being said I think this will actually help them to heal much better and much more quickly currently. With that being said patient was a little bit worried about the debridement as far as pain was concerned fortunately this seemed to go very well today. Pellot, Kona L. ([id number]) Patient History Information obtained from Patient. Family History No family history of Cancer, Diabetes, Heart Disease, Hereditary Spherocytosis, Hypertension, Kidney Disease, Lung Disease, Seizures, Stroke, Thyroid Problems, Tuberculosis. Social History Former smoker, Marital Status - Married, Alcohol Use - Rarely - 2oz day, Drug Use - No History, Caffeine Use - Daily. Review of Systems (ROS) Constitutional Symptoms (General Health) Denies complaints or symptoms of Fever, Chills. Respiratory The patient has no complaints or symptoms. Cardiovascular The patient has no complaints or symptoms. Psychiatric The patient has no complaints or symptoms. Objective Constitutional Well-nourished and well-hydrated in no acute distress. Vitals Time Taken: 12:52 PM, Height: 70 in, Weight: 190 lbs, BMI: 27.3, Temperature: 98.0 F, Pulse: 78 bpm, Respiratory Rate: 16 breaths/min, Blood Pressure: 105/49 mmHg. Respiratory normal breathing without difficulty. Psychiatric this patient is able to make decisions and demonstrates good insight into disease process. Alert and Oriented x 3. pleasant and cooperative. General Notes: I did actually perform debridement today utilizing scissors and forceps along with a curette once the eschar was removed to clean out some of the necrotic tissue. He tolerated all of this with minimal discomfort at this point and in fact it was only on the left foot towards the end when I was the breeding utilizing a curette that he had any issues whatsoever. However post debridement the wound did appear to be much better and I think  will do much better on going. Integumentary (Hair, Skin) Wound #1 status is Open. Original cause of wound was Pressure Injury. The wound is located on the Left Calcaneus. The wound measures 3.5cm length x 3.8cm width x 0.2cm depth; 10.446cm^2 area and 2.089cm^3 volume. There is no tunneling or undermining noted. There is a large amount of serous drainage noted. The wound margin is distinct with the outline attached to the wound base. There is small (1-33%) red, hyper - granulation within the wound bed. There is a large (67-100%) amount of necrotic tissue within the wound bed including Eschar. The periwound skin appearance exhibited: Dry/Scaly. Periwound temperature was noted as No Abnormality. The periwound has tenderness on palpation. Wound #2 status is Open. Original cause of wound was Pressure Injury. The wound is located on the Right Calcaneus. The [place] ([id number]) wound measures 2.1cm length x 2.8cm width x 0.2cm depth; 4.618cm^2 area and 0.924cm^3 volume. There is no tunneling or undermining noted. There is a large amount of serous drainage noted. The wound margin is thickened. There is no granulation within the wound bed. There is a large (67-100%) amount of necrotic tissue within the wound bed including Eschar. The periwound skin appearance exhibited: Dry/Scaly. Periwound temperature was noted as No Abnormality. The periwound has tenderness on palpation. Wound #3 status is Open. Original cause of wound was Pressure Injury. The wound is located on the [employer] The wound measures 0.2cm length x 0.2cm width x 0.1cm depth; 0.031cm^2 area and 0.003cm^3 volume. There is no tunneling or undermining noted. There is a small amount of serosanguineous drainage noted. The wound margin is flat and intact. There is no granulation within the wound bed. There is a large (67-100%) amount of necrotic tissue within the wound bed including Eschar. The periwound skin appearance did  not exhibit:  Callus, Crepitus, Excoriation, Induration, Rash, Scarring, Dry/Scaly, Maceration, Atrophie [place], Cyanosis, Ecchymosis, Hemosiderin Staining, Mottled, Pallor, Rubor, Erythema. Periwound temperature was noted as No Abnormality. 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 There was a Excisional Skin/Subcutaneous Tissue Debridement with a total area of 13.3 sq cm performed by STONE III, HOYT E., PA-C. With the following instrument(s): Forceps, and Scissors. to remove Viable and Non-Viable tissue/material Material removed includes Eschar, Subcutaneous Tissue, and [place] after achieving pain control using Lidocaine 4% Topical Solution. No specimens were taken. A time out was conducted at 13:22, prior to the start of the procedure. A Minimum amount of bleeding was controlled with Pressure. The procedure was tolerated well with a pain level of 0 throughout and a pain level of 0 following the procedure. Post Debridement Measurements: 3.8cm length x 3.6cm width x 0.3cm depth; 3.223cm^3 volume. Post debridement Stage noted as Category/Stage III. Character of Wound/Ulcer Post Debridement is improved. Post procedure Diagnosis Wound #1: Same as Pre-Procedure Wound #2 Pre-procedure diagnosis of Wound #2 is a Pressure Ulcer located on the Right Calcaneus . There was a Excisional Skin/Subcutaneous Tissue Debridement with a total area of 5.88 sq cm performed by STONE III, HOYT E., PA-C. With the following instrument(s): Forceps, and Scissors. to remove Viable and Non-Viable tissue/material Material removed includes Eschar, Subcutaneous Tissue, and [place] after  achieving pain control using Lidocaine 4% Topical Solution. No specimens were taken. A time out was conducted at 13:18, prior to the start of the procedure. A Minimum amount of bleeding was controlled with Pressure. The procedure was tolerated well with a pain level of 0 throughout and a pain level of 0 following the procedure. Post Debridement Measurements: 2.4cm length x 2.6cm width x 0.2cm depth; 0.98cm^3 volume. Post debridement Stage noted as Unstageable/Unclassified. Character of Wound/Ulcer Post Debridement is improved. [place], [place] L. ([id number]) Post procedure Diagnosis Wound #2: Same as Pre-Procedure Plan Wound Cleansing: Wound #1 Left Calcaneus: Clean wound with Normal Saline. Cleanse wound with mild soap and water May Shower, gently pat wound dry prior to applying new dressing. Wound #2 Right Calcaneus: Clean wound with Normal Saline. Cleanse wound with mild soap and water May Shower, gently pat wound dry prior to applying new dressing. Wound #3 Toe Great: Clean wound with Normal Saline. Cleanse wound with mild soap and water May Shower, gently pat wound dry prior to applying new dressing. Primary Wound Dressing: Wound #1 Left Calcaneus: Iodoflex Wound #2 Right Calcaneus: Iodoflex Wound #3 Toe Great: Other: - provodone-iodine Secondary Dressing: Wound #1 Left Calcaneus: Dry Gauze Foam Wound #2 Right Calcaneus: Dry Gauze Foam Wound #3 Toe Great: Other - no bandages Dressing Change Frequency: Wound #1 Left Calcaneus: Change dressing every day. Change dressing twice daily. - morning and evening Wound #2 Right Calcaneus: Change dressing every day. Wound #3 Toe Great: Change dressing every day. Follow-up Appointments: Wound #1 Left Calcaneus: Return Appointment in 2 weeks. Wound #2 Right Calcaneus: Return Appointment in 2 weeks. Wound #3 Toe Great: Return Appointment in 2 weeks. Off-Loading: Wound #1 Left Calcaneus: Heel suspension boot to: - Please  order prevalon boots for bilateral feet Please float heel when pt is in the bed (NO PRESSURE  ON HEELS) Turn and reposition every 2 hours Other: - Adjustable Blanket Lift Bar so the sheets and blanket does not put pressure on the toes [place], Yoshito L. ([id number]) Wound #2 Right Calcaneus: Heel suspension boot to: - Please order prevalon boots for bilateral feet Please float heel when pt is in the bed (NO PRESSURE ON HEELS) Turn and reposition every 2 hours Other: - Adjustable Blanket Lift Bar so the sheets and blanket does not put pressure on the toes Wound #3 Toe Great: Heel suspension boot to: - Please order prevalon boots for bilateral feet Please float heel when pt is in the bed (NO PRESSURE ON HEELS) Turn and reposition every 2 hours Other: - Adjustable Blanket Lift Bar so the sheets and blanket does not put pressure on the toes Additional Orders / Instructions: Wound #1 Left Calcaneus: Increase protein intake. Wound #2 Right Calcaneus: Increase protein intake. Wound #3 Toe Great: Increase protein intake. Home Health: Wound #1 Left Calcaneus: [place] Nurse may visit PRN to address patient s wound care needs. FACE TO FACE ENCOUNTER: MEDICARE and MEDICAID PATIENTS: I certify that this patient is under my care and that I had a face-to-face encounter that meets the physician face-to-face encounter requirements with this patient on this date. The encounter with the patient was in whole or in part for the following MEDICAL CONDITION: (primary reason for [place]) MEDICAL NECESSITY: I certify, that based on my findings, NURSING services are a medically necessary home health service. HOME BOUND STATUS: I certify that my clinical findings support that this patient is homebound (i.e., Due to illness or injury, pt requires aid of supportive devices such as crutches, cane, wheelchairs, walkers, the use of special transportation or the assistance of  another person to leave their place of residence. There is a normal inability to leave the home and doing so requires considerable and taxing effort. Other absences are for medical reasons / religious services and are infrequent or of short duration when for other reasons). If current dressing causes regression in wound condition, may D/C ordered dressing product/s and apply Normal Saline Moist Dressing daily until next [place] / Other MD appointment. [place] of regression in wound condition at [phone]. Please direct any NON-WOUND related issues/requests for orders to patient's Primary Care Physician Wound #2 Right Calcaneus: [place] Nurse may visit PRN to address patient s wound care needs. FACE TO FACE ENCOUNTER: MEDICARE and MEDICAID PATIENTS: I certify that this patient is under my care and that I had a face-to-face encounter that meets the physician face-to-face encounter requirements with this patient on this date. The encounter with the patient was in whole or in part for the following MEDICAL CONDITION: (primary reason for [place]) MEDICAL NECESSITY: I certify, that based on my findings, NURSING services are a medically necessary home health service. HOME BOUND STATUS: I certify that my clinical findings support that this patient is homebound (i.e., Due to illness or injury, pt requires aid of supportive devices such as crutches, cane, wheelchairs, walkers, the use of special transportation or the assistance of another person to leave their place of residence. There is a normal inability to leave the home and doing so requires considerable and taxing effort. Other absences are for medical reasons / religious services and are infrequent or of short duration when for other reasons). If current dressing causes regression in wound condition, may D/C ordered dressing product/s and apply  Normal Saline Moist Dressing  daily until next [place] / Other MD appointment. [place] of regression in wound condition at [phone]. Please direct any NON-WOUND related issues/requests for orders to patient's Primary Care Physician Wound #3 Toe Great: [place] Nurse may visit PRN to address patient s wound care needs. FACE TO FACE ENCOUNTER: MEDICARE and MEDICAID PATIENTS: I certify that this patient is under my care and that I had a face-to-face encounter that meets the physician face-to-face encounter requirements with this patient on this date. The encounter with the patient was in whole or in part for the following MEDICAL CONDITION: (primary reason for [place]) MEDICAL NECESSITY: I certify, that based on my findings, NURSING services are a medically necessary home health service. HOME BOUND STATUS: I certify that my clinical findings support that this patient is homebound (i.e., Due to illness or injury, pt requires aid of supportive devices such as crutches, cane, wheelchairs, walkers, the use of special transportation or the assistance of another person to leave their place of residence. There is a normal inability to leave the [place], [place] ([id number]) home and doing so requires considerable and taxing effort. Other absences are for medical reasons / religious services and are infrequent or of short duration when for other reasons). If current dressing causes regression in wound condition, may D/C ordered dressing product/s and apply Normal Saline Moist Dressing daily until next [place] / Other MD appointment. [place] of regression in wound condition at [phone]. Please direct any NON-WOUND related issues/requests for orders to patient's Primary Care Physician At this point I'm gonna suggest that we switch to Iodoflex for the bilateral heels. We will continue to paint with Betadine in regard to the  left great toe. We will subsequently see him back for reevaluation and see were things stand. Please see above for specific wound care orders. We will see patient for re-evaluation in 1 week(s) here in the clinic. If anything worsens or changes patient will contact our office for additional recommendations. Electronic Signature(s) Signed: 10/25/2017 5:53:05 PM By: Worthy Keeler PA-C Entered By: Worthy Keeler on 10/25/2017 17:31:15 [place], [place] ([id number]) -------------------------------------------------------------------------------- ROS/PFSH Details Patient Name: SHERRARD, Kaven L. Date of Service: 10/25/2017 1:00 PM Medical Record Number: [id number] Patient Account Number: [id number] Date of Birth/Sex: [birth year] (84 y.o. M) Treating RN: Montey Hora Primary Care Provider: Ramonita Lab Other Clinician: Referring Provider: Ramonita Lab Treating Provider/Extender: Melburn Hake, HOYT Weeks in Treatment: 9 Information Obtained From Patient Wound History Do you currently have one or more open woundso Yes How many open wounds do you currently haveo 2 Approximately how long have you had your woundso 1 week How have you been treating your wound(s) until nowo nothing Has your wound(s) ever healed and then re-openedo No Have you had any lab work done in the past montho No Have you tested positive for osteomyelitis (bone infection)o No Have you had any tests for circulation on your legso No Have you had other problems associated with your woundso Swelling Constitutional Symptoms (General Health) Complaints and Symptoms: Negative for: Fever; Chills Respiratory Complaints and Symptoms: No Complaints or Symptoms Cardiovascular Complaints and Symptoms: No Complaints or Symptoms Medical History: Positive for: Arrhythmia - a-fib; Hypertension Oncologic Medical History: Positive for: Received Radiation Psychiatric Complaints and Symptoms: No Complaints or  Symptoms Immunizations Pneumococcal Vaccine: Received Pneumococcal Vaccination: Yes Implantable Devices Family and Social History [place], [place] L. ([id number]) Cancer: No; Diabetes:  No; Heart Disease: No; Hereditary Spherocytosis: No; Hypertension: No; Kidney Disease: No; Lung Disease: No; Seizures: No; Stroke: No; Thyroid Problems: No; Tuberculosis: No; Former smoker; Marital Status - Married; Alcohol Use: Rarely - 2oz day; Drug Use: No History; Caffeine Use: Daily; Financial Concerns: No; Food, Clothing or Shelter Needs: No; Support System Lacking: No; Transportation Concerns: No; Advanced Directives: Yes (Not Provided); Patient does not want information on Advanced Directives; Do not resuscitate: No; Living Will: Yes (Not Provided); Medical Power of Attorney: No Physician Affirmation I have reviewed and agree with the above information. Electronic Signature(s) Signed: 10/25/2017 5:53:05 PM By: Worthy Keeler PA-C Signed: 10/29/2017 4:18:46 PM By: Montey Hora Entered By: Worthy Keeler on 10/25/2017 17:29:50 [place], Mart L. ([id number]) -------------------------------------------------------------------------------- SuperBill Details Patient Name: BISIG, Jamesyn L. Date of Service: 10/25/2017 Medical Record Number: [id number] Patient Account Number: [id number] Date of Birth/Sex: 28-Aug-1925 (84 y.o. M) Treating RN: Montey Hora Primary Care Provider: Ramonita Lab Other Clinician: Referring Provider: Ramonita Lab Treating Provider/Extender: Melburn Hake, HOYT Weeks in Treatment: 9 Diagnosis Coding ICD-10 Codes Code Description L89.620 Pressure ulcer of left heel, unstageable L89.610 Pressure ulcer of right heel, unstageable I48.2 Chronic atrial fibrillation I10 Essential (primary) hypertension Z79.01 Long term (current) use of anticoagulants Facility Procedures CPT4 Code: [id number] Description: 30865 - DEB SUBQ TISSUE 20 SQ CM/< ICD-10 Diagnosis Description L89.620 Pressure ulcer  of left heel, unstageable L89.610 Pressure ulcer of right heel, unstageable Modifier: Quantity: 1 Physician Procedures CPT4 Code: [id number] Description: 95284 - WC PHYS SUBQ TISS 20 SQ CM ICD-10 Diagnosis Description L89.620 Pressure ulcer of left heel, unstageable L89.610 Pressure ulcer of right heel, unstageable Modifier: Quantity: 1 Electronic Signature(s) Signed: 10/25/2017 5:53:05 PM By: Worthy Keeler PA-C Entered By: Worthy Keeler on 10/25/2017 17:31:27

## 2017-11-04 DIAGNOSIS — D692 Other nonthrombocytopenic purpura: Secondary | ICD-10-CM | POA: Diagnosis not present

## 2017-11-04 DIAGNOSIS — N183 Chronic kidney disease, stage 3 (moderate): Secondary | ICD-10-CM | POA: Diagnosis not present

## 2017-11-04 DIAGNOSIS — I2510 Atherosclerotic heart disease of native coronary artery without angina pectoris: Secondary | ICD-10-CM | POA: Diagnosis not present

## 2017-11-04 DIAGNOSIS — I482 Chronic atrial fibrillation: Secondary | ICD-10-CM | POA: Diagnosis not present

## 2017-11-04 DIAGNOSIS — I739 Peripheral vascular disease, unspecified: Secondary | ICD-10-CM | POA: Diagnosis not present

## 2017-11-04 DIAGNOSIS — D519 Vitamin B12 deficiency anemia, unspecified: Secondary | ICD-10-CM | POA: Diagnosis not present

## 2017-11-04 DIAGNOSIS — Z8546 Personal history of malignant neoplasm of prostate: Secondary | ICD-10-CM | POA: Diagnosis not present

## 2017-11-04 DIAGNOSIS — K219 Gastro-esophageal reflux disease without esophagitis: Secondary | ICD-10-CM | POA: Diagnosis not present

## 2017-11-04 DIAGNOSIS — I700 Atherosclerosis of aorta: Secondary | ICD-10-CM | POA: Diagnosis not present

## 2017-11-04 DIAGNOSIS — I10 Essential (primary) hypertension: Secondary | ICD-10-CM | POA: Diagnosis not present

## 2017-11-04 DIAGNOSIS — I517 Cardiomegaly: Secondary | ICD-10-CM | POA: Diagnosis not present

## 2017-11-04 DIAGNOSIS — L89893 Pressure ulcer of other site, stage 3: Secondary | ICD-10-CM | POA: Diagnosis not present

## 2017-11-04 DIAGNOSIS — E7801 Familial hypercholesterolemia: Secondary | ICD-10-CM | POA: Diagnosis not present

## 2017-11-08 ENCOUNTER — Encounter: Payer: PPO | Attending: Physician Assistant | Admitting: Physician Assistant

## 2017-11-08 DIAGNOSIS — I10 Essential (primary) hypertension: Secondary | ICD-10-CM | POA: Insufficient documentation

## 2017-11-08 DIAGNOSIS — L97519 Non-pressure chronic ulcer of other part of right foot with unspecified severity: Secondary | ICD-10-CM | POA: Diagnosis not present

## 2017-11-08 DIAGNOSIS — Z79899 Other long term (current) drug therapy: Secondary | ICD-10-CM | POA: Diagnosis not present

## 2017-11-08 DIAGNOSIS — Z7901 Long term (current) use of anticoagulants: Secondary | ICD-10-CM | POA: Insufficient documentation

## 2017-11-08 DIAGNOSIS — I482 Chronic atrial fibrillation: Secondary | ICD-10-CM | POA: Insufficient documentation

## 2017-11-08 DIAGNOSIS — L89620 Pressure ulcer of left heel, unstageable: Secondary | ICD-10-CM | POA: Insufficient documentation

## 2017-11-08 DIAGNOSIS — L89610 Pressure ulcer of right heel, unstageable: Secondary | ICD-10-CM | POA: Insufficient documentation

## 2017-11-12 ENCOUNTER — Other Ambulatory Visit: Payer: Self-pay | Admitting: *Deleted

## 2017-11-12 NOTE — Patient Outreach (Signed)
[facility]) Care Management  11/12/2017  Panfilo Ketchum. Feb 19, 1926 [id number]  84year old male, on 1/7 ED admission after fall at home noted pubic rami fracture, no surgical plan, WBAT on RLE.  Discharged to [facility] on 1/9, DC to home on 08/23/17.  PMH includes : Hypertension , chronic atrial fib, bilateral heel ulcers, PVD, osteoarthritis.   Telephone assessment   Placed call to patient wife, HIPAA verified. Wife discussed patient recent visits to PCP and cardiology offices first visits since discharge home.   Spouse reports patient has been able to take first shower since discharge with the help of occupational therapy,but she feels comfortable with assisting him the next time.   Patient is still followed by [facility] home health RN for wound care 1 to 2 times per week, wife changes dressing on other days, she denies increased redness or drainage at site. Patient has follow up wound clinic visit on this week.   Wife denies any new concerns at this time.   Plan  Will schedule telephone follow up call in the next 2 weeks and case closure if no new concerns or care management needs.    Joylene Draft, RN, [place] Management Coordinator  [phone]- Mobile [phone]- Toll Free Main Office

## 2017-11-13 ENCOUNTER — Ambulatory Visit (INDEPENDENT_AMBULATORY_CARE_PROVIDER_SITE_OTHER): Payer: PPO

## 2017-11-13 ENCOUNTER — Ambulatory Visit (INDEPENDENT_AMBULATORY_CARE_PROVIDER_SITE_OTHER): Payer: PPO | Admitting: Vascular Surgery

## 2017-11-13 ENCOUNTER — Encounter (INDEPENDENT_AMBULATORY_CARE_PROVIDER_SITE_OTHER): Payer: Self-pay | Admitting: Vascular Surgery

## 2017-11-13 VITALS — BP 93/59 | HR 68 | Resp 16 | Ht 70.0 in | Wt 176.0 lb

## 2017-11-13 DIAGNOSIS — I10 Essential (primary) hypertension: Secondary | ICD-10-CM

## 2017-11-13 DIAGNOSIS — E785 Hyperlipidemia, unspecified: Secondary | ICD-10-CM

## 2017-11-13 DIAGNOSIS — I6523 Occlusion and stenosis of bilateral carotid arteries: Secondary | ICD-10-CM

## 2017-11-13 DIAGNOSIS — I739 Peripheral vascular disease, unspecified: Secondary | ICD-10-CM | POA: Diagnosis not present

## 2017-11-13 NOTE — Progress Notes (Signed)
Subjective:    Patient ID: [name] Aid., male    DOB: 12/03/1925, 84 y.o.   MRN: [id number] Chief Complaint  Patient presents with  . Follow-up    71yr abi,carotid   Patient presents for yearly carotid artery stenosis and peripheral vascular disease follow-up.  Patient was seen with his wife.  The patient presents today without complaint.  The patient denies any claudication-like symptoms, rest pain or ulceration to the bilateral lower extremity.  The patient denies any amaurosis fugax or neurological symptoms.  The patient underwent a bilateral carotid ultrasound which was notable for 4050% stenosis in the right internal carotid artery.  There is no evidence of stenosis in the left internal carotid artery.  The patient does have a history of a left CEA on October 12, 2011.  Compared to the previous exam on October 12, 2016 there has been no significant change the patient underwent a bilateral ABI which is notable for biphasic tibials and noncompressible arteries due to medial calcification.  Bilateral ABIs are essentially unchanged compared to the prior study completed on October 12, 2016.  The patient denies any fever, nausea vomiting.  Review of Systems  Constitutional: Negative.   HENT: Negative.   Eyes: Negative.   Respiratory: Negative.   Cardiovascular:       Carotid stenosis Peripheral artery disease  Gastrointestinal: Negative.   Endocrine: Negative.   Genitourinary: Negative.   Musculoskeletal: Negative.   Skin: Negative.   Allergic/Immunologic: Negative.   Neurological: Negative.   Hematological: Negative.   Psychiatric/Behavioral: Negative.       Objective:   Physical Exam  Constitutional: He is oriented to person, place, and time. He appears well-developed and well-nourished. No distress.  HENT:  Head: Normocephalic and atraumatic.  Right Ear: External ear normal.  Left Ear: External ear normal.  Eyes: Pupils are equal, round, and reactive to light. Conjunctivae and EOM  are normal.  Neck: Normal range of motion.  Cardiovascular: Normal rate, regular rhythm, normal heart sounds and intact distal pulses.  Pulses:      Radial pulses are 2+ on the right side, and 2+ on the left side.  Hard to palpate pedal pulses however the bilateral feet are warm  Pulmonary/Chest: Effort normal and breath sounds normal.  Musculoskeletal: Normal range of motion. He exhibits no edema.  Neurological: He is alert and oriented to person, place, and time.  Skin: Skin is warm and dry. He is not diaphoretic.  Psychiatric: He has a normal mood and affect. His behavior is normal. Judgment and thought content normal.  Vitals reviewed.  BP (!) 93/59 (BP Location: Right Arm)   Pulse 68   Resp 16   Ht 5\' 10"  (1.778 m)   Wt 176 lb (79.8 kg)   BMI 25.25 kg/m   Past Medical History:  Diagnosis Date  . Atrial fibrillation ([place])   . Cancer ([facility])    PROSTATE  . Dysrhythmia    AFIB  . GERD (gastroesophageal reflux disease)   . Hypertension    Social History   Socioeconomic History  . Marital status: Married    Spouse name: Not on file  . Number of children: Not on file  . Years of education: Not on file  . Highest education level: Not on file  Occupational History  . Not on file  Social Needs  . Financial resource strain: Not on file  . Food insecurity:    Worry: Not on file    Inability: Not on file  .  Transportation needs:    Medical: Not on file    Non-medical: Not on file  Tobacco Use  . Smoking status: Former Research scientist (life sciences)  . Smokeless tobacco: Never Used  Substance and Sexual Activity  . Alcohol use: Yes    Comment: 1.5 oz daily - 6/3  last use  . Drug use: No  . Sexual activity: Not on file  Lifestyle  . Physical activity:    Days per week: Not on file    Minutes per session: Not on file  . Stress: Not on file  Relationships  . Social connections:    Talks on phone: Not on file    Gets together: Not on file    Attends religious service: Not on file     Active member of club or organization: Not on file    Attends meetings of clubs or organizations: Not on file    Relationship status: Not on file  . Intimate partner violence:    Fear of current or ex partner: Not on file    Emotionally abused: Not on file    Physically abused: Not on file    Forced sexual activity: Not on file  Other Topics Concern  . Not on file  Social History Narrative  . Not on file   Past Surgical History:  Procedure Laterality Date  . CAROTID ENDARTERECTOMY Left   . CATARACT EXTRACTION W/PHACO Right 10/13/2015   Procedure: CATARACT EXTRACTION PHACO AND INTRAOCULAR LENS PLACEMENT (IOC);  Surgeon: Leandrew Koyanagi, MD;  Location: [facility] ORS;  Service: Ophthalmology;  Laterality: Right;  Lot # [id number] H US:01:23.3 AP:17.2 CDE:14.31  . CATARACT EXTRACTION W/PHACO Left 12/12/2015   Procedure: CATARACT EXTRACTION PHACO AND INTRAOCULAR LENS PLACEMENT (IOC);  Surgeon: Leandrew Koyanagi, MD;  Location: [facility] ORS;  Service: Ophthalmology;  Laterality: Left;  [place] 1.10 AP% 15.9 CDE 11.22 FLUID PACK LOT # [id number]  . JOINT REPLACEMENT Right    TKR   Family History  Problem Relation Age of Onset  . Heart attack Father    No Known Allergies     Assessment & Plan:  Patient presents for yearly carotid artery stenosis and peripheral vascular disease follow-up.  Patient was seen with his wife.  The patient presents today without complaint.  The patient denies any claudication-like symptoms, rest pain or ulceration to the bilateral lower extremity.  The patient denies any amaurosis fugax or neurological symptoms.  The patient underwent a bilateral carotid ultrasound which was notable for 4050% stenosis in the right internal carotid artery.  There is no evidence of stenosis in the left internal carotid artery.  The patient does have a history of a left CEA on October 12, 2011.  Compared to the previous exam on October 12, 2016 there has been no significant change the patient underwent a  bilateral ABI which is notable for biphasic tibials and noncompressible arteries due to medial calcification.  Bilateral ABIs are essentially unchanged compared to the prior study completed on October 12, 2016.  The patient denies any fever, nausea vomiting.  1. Bilateral carotid artery stenosis - Stable Studies reviewed with patient. Patient asymptomatic with stable duplex.  No intervention at this time.  Patient to return in one year for surveillance carotid duplex. Patient to remain abstinent of tobacco use. I have discussed with the patient at length the risk factors for and pathogenesis of atherosclerotic disease and encouraged a healthy diet, regular exercise regimen and blood pressure / glucose control.  Patient was instructed to contact our office in the  interim with problems such as arm / leg weakness or numbness, speech / swallowing difficulty or temporary monocular blindness. The patient expresses their understanding.   - VAS US CAROTID; Future  2. Peripheral artery disease ([place]) - Stable Studies reviewed with patient. Patient presents asymptomatically ABI is stable when compared to the one conducted 1 year ago Physical exam is stable There is no indication for intervention at this time Patient to remain abstinent of tobacco use. I have discussed with the patient at length the risk factors for and pathogenesis of atherosclerotic disease and encouraged a healthy diet, regular exercise regimen and blood pressure / glucose control.  The patient was encouraged to call the office in the interim if he experiences any claudication like symptoms, rest pain or ulcers to his feet / toes.  - VAS [place] ABI WITH/WO TBI; Future  3. Hyperlipidemia, unspecified hyperlipidemia type - Stable Encouraged good control as its slows the progression of atherosclerotic disease  4. Essential hypertension - Stable Encouraged good control as its slows the progression of atherosclerotic disease  Current  Outpatient Medications on File Prior to Visit  Medication Sig Dispense Refill  . amLODipine (NORVASC) 5 MG tablet     . Cholecalciferol (VITAMIN D-3 PO) Take by mouth.    . diphenhydramine-acetaminophen (TYLENOL PM) 25-500 MG TABS tablet 1-1/2 tablets at bedtime.    . lovastatin (MEVACOR) 40 MG tablet Take by mouth at bedtime.    . Multiple Vitamin (MULTIVITAMIN) capsule Take 1 capsule by mouth daily.    . pantoprazole (PROTONIX) 40 MG tablet TAKE 1 TABLET BY MOUTH EVERY DAY    . terazosin (HYTRIN) 2 MG capsule Take 2 mg by mouth at bedtime. 2TABS DAILY    . vitamin B-12 (CYANOCOBALAMIN) 1000 MCG tablet Take 1,000 mcg by mouth daily.    Marland Kitchen warfarin (COUMADIN) 2 MG tablet Take 4 mg by mouth daily. 2 TABS DAILY     . bisacodyl (DULCOLAX) 5 MG EC tablet Take 1 tablet (5 mg total) by mouth daily as needed for moderate constipation. (Patient not taking: Reported on 08/30/2017) 30 tablet 0  . cephALEXin (KEFLEX) 500 MG capsule Take 500 mg by mouth 3 (three) times daily.    Marland Kitchen HYDROcodone-acetaminophen (NORCO/VICODIN) 5-325 MG tablet Take 1-2 tablets by mouth every 4 (four) hours as needed for moderate pain. (Patient not taking: Reported on 08/30/2017) 30 tablet 0  . methocarbamol (ROBAXIN) 500 MG tablet Take 1 tablet (500 mg total) by mouth every 6 (six) hours as needed for muscle spasms. (Patient not taking: Reported on 08/30/2017) 30 tablet 0   No current facility-administered medications on file prior to visit.    There are no Patient Instructions on file for this visit. No follow-ups on file.  Misaki Sozio A Verma Grothaus, PA-C

## 2017-11-14 NOTE — Progress Notes (Signed)
Troup, Jovonte L. ([id number]) Visit Report for 11/08/2017 Chief Complaint Document Details Patient Name: [name], [name] Date of Service: 11/08/2017 1:00 PM Medical Record Number: [id number] Patient Account Number: [id number] Date of Birth/Sex: 1926-02-25 (84 y.o. M) Treating RN: Montey Hora Primary Care Provider: Ramonita Lab Other Clinician: Referring Provider: Ramonita Lab Treating Provider/Extender: Melburn Hake, HOYT Weeks in Treatment: 11 Information Obtained from: Patient Chief Complaint Bilateral Heel pressure ulcers and bilateral great toe DTIs Electronic Signature(s) Signed: 11/12/2017 5:56:33 PM By: Worthy Keeler PA-C Entered By: Worthy Keeler on 11/08/2017 12:58:17 Kuntzman, Jourdin L. ([id number]) -------------------------------------------------------------------------------- Debridement Details Patient Name: [name], [name] Date of Service: 11/08/2017 1:00 PM Medical Record Number: [id number] Patient Account Number: [id number] Date of Birth/Sex: [birth year] (84 y.o. M) Treating RN: Montey Hora Primary Care Provider: Ramonita Lab Other Clinician: Referring Provider: Ramonita Lab Treating Provider/Extender: Melburn Hake, HOYT Weeks in Treatment: 11 Debridement Performed for Wound #2 Right Calcaneus Assessment: Performed By: Physician STONE III, HOYT E., PA-C Debridement Type: Debridement Pre-procedure Verification/Time Yes - 13:30 Out Taken: Start Time: 13:30 Pain Control: Lidocaine 4% Topical Solution Total Area Debrided (L x W): 1.8 (cm) x 2.1 (cm) = 3.78 (cm) Tissue and other material Viable, Non-Viable, Slough, Subcutaneous, Fibrin/Exudate, Slough debrided: Level: Skin/Subcutaneous Tissue Debridement Description: Excisional Instrument: Curette Bleeding: Minimum Hemostasis Achieved: Pressure End Time: 13:33 Procedural Pain: 0 Post Procedural Pain: 0 Response to Treatment: Procedure was tolerated well Post Debridement Measurements of Total Wound Length: (cm)  1.8 Stage: Unstageable/Unclassified Width: (cm) 2.1 Depth: (cm) 0.4 Volume: (cm) 1.188 Character of Wound/Ulcer Post Improved Debridement: Post Procedure Diagnosis Same as Pre-procedure Electronic Signature(s) Signed: 11/08/2017 4:48:08 PM By: Montey Hora Signed: 11/12/2017 5:56:33 PM By: Worthy Keeler PA-C Entered By: Montey Hora on 11/08/2017 13:34:05 [name], [name] ([id number]) -------------------------------------------------------------------------------- Debridement Details Patient Name: [name], [name] Date of Service: 11/08/2017 1:00 PM Medical Record Number: [id number] Patient Account Number: [id number] Date of Birth/Sex: 1925/12/21 (84 y.o. M) Treating RN: Montey Hora Primary Care Provider: Ramonita Lab Other Clinician: Referring Provider: Ramonita Lab Treating Provider/Extender: Melburn Hake, HOYT Weeks in Treatment: 11 Debridement Performed for Wound #1 Left Calcaneus Assessment: Performed By: Physician STONE III, HOYT E., PA-C Debridement Type: Debridement Pre-procedure Verification/Time Yes - 13:33 Out Taken: Start Time: 13:33 Pain Control: Lidocaine 4% Topical Solution Total Area Debrided (L x W): 3 (cm) x 4 (cm) = 12 (cm) Tissue and other material Viable, Non-Viable, Slough, Subcutaneous, Fibrin/Exudate, Slough debrided: Level: Skin/Subcutaneous Tissue Debridement Description: Excisional Instrument: Curette Bleeding: Minimum Hemostasis Achieved: Pressure End Time: 13:37 Procedural Pain: 0 Post Procedural Pain: 0 Response to Treatment: Procedure was tolerated well Post Debridement Measurements of Total Wound Length: (cm) 3 Stage: Unstageable/Unclassified Width: (cm) 4 Depth: (cm) 0.9 Volume: (cm) 8.482 Character of Wound/Ulcer Post Improved Debridement: Post Procedure Diagnosis Same as Pre-procedure Electronic Signature(s) Signed: 11/08/2017 4:48:08 PM By: Montey Hora Signed: 11/12/2017 5:56:33 PM By: Worthy Keeler PA-C Entered By:  Montey Hora on 11/08/2017 13:39:10 [name], [name] ([id number]) -------------------------------------------------------------------------------- HPI Details Patient Name: [name], [name] Date of Service: 11/08/2017 1:00 PM Medical Record Number: [id number] Patient Account Number: [id number] Date of Birth/Sex: October 04, 1925 (84 y.o. M) Treating RN: Montey Hora Primary Care Provider: Ramonita Lab Other Clinician: Referring Provider: Ramonita Lab Treating Provider/Extender: Melburn Hake, HOYT Weeks in Treatment: 11 History of Present Illness Associated Signs and Symptoms: Patient has a history of chronic atrophic relation, hypertension, and long-term use of anticoagulants. He also had a fall with pelvic fracture which occurred on January 2019. HPI Description:  08/19/17 patient presents today for initial evaluation and our clinic concerning issues he has been having with the bilateral heels as well as the lateral great toe was since he was admitted to the nursing facility following a pelvic fracture. Initially he was spending a significant amount of time in the bed although he is now up more often since the healing process has progressed along the way obviously. When he is up he's spending most of the time sitting in a wheelchair at this point. When he is in bed he lays on his back and the covers/sheets are pushing down on his toes bilaterally which I think it may be what's causing the issues with his deep tissue injury to the bilateral great toes. With that being said the hills also appear to be pressure in nature and I think that laying in the bed getting pressure to the sites is what has led to the formation of these on stage will pressure ulcers. Fortunately there does not appear to be any infection in both areas of ulceration of the heel appear to be stable at this point. Patient does not have any significant pain at the site she does have some of the eschar along the edges which is starting to  lift up and come all hopefully slowly but surely this will occur. Patient did have arterial studies performed April 2018 which showed a normal TBI on the right knee slightly depressed TBI on the left but this does not appear to have changed significantly at that point. Specifically this was 0.75 on the right and 0.61 on the left. 09/13/17 on evaluation today patient presents for a follow-up evaluation regarding his bilateral heal ulcers. He has been tolerating the dressing changes fairly well although he did have some bleeding earlier today when it appears some of the skin on the surrounding portion of the wound got pulled where this is starting to flake off. Subsequently there does not appear to be any bleeding right now. Since I last saw him he has been discharged from the facility and is now back at home. His wife is having a very difficult time being able to get him up and in the car in order to come for an appointment which is why they missed the last appointment have not been able to come back sooner. Fortunately the he does not have any signs of infection. No fevers chills noted. He does have difficulty walking due to pain in the bilateral heels. 10/11/17 on evaluation today patient presents for follow-up evaluation concerning his ongoing bilateral heal ulcers. He has been tolerating the dressing changes which were Betadine paints daily without complication. I have not seen him since September 13, 2017. Pressure that I saw him August 19, 2017. Subsequently those are the only prior visits before today. His left heel actually seems to have a significant amount of eschar that is starting to lift up at this point and seems to be a little more loose as far as drainage building up underneath the eschar. The right eschar though there are some parts lifting up seems to still be fairly stable which is good news. His toe ulcer is almost completely healed a lot of that eschar came off and it looks very  well underneath. 10/25/17 on evaluation today patient presents today regarding his bilateral heal ulcers in his right great toe ulcer. At this point his heels actually have loosened and softened to the point that I think we do need to remove these at  this time. With that being said I think this will actually help them to heal much better and much more quickly currently. With that being said patient was a little bit worried about the debridement as far as pain was concerned fortunately this seemed to go very well today. 11/08/17 evaluation today patient's hills actually appear to be doing better compared to last 2 week's evaluation. The right heel especially is doing great and show signs of excellent granulation at this time. With that being said the left heel is much deeper and I was able to clean away a lot of the slough and subcutaneous tissue at this point he still has some noted that will take time to clear away. With that being said he has been tolerating the dressing changes will complication the doesn't of been some issues with getting the proper supplies I'm not exactly sure what the issue is there however we are to contact home health and try to figure this out. In the meantime fortunately he does not seem to have any significant pain on the right he does have some pain walking on the left which makes sense. [name], [name] ([id number]) Electronic Signature(s) Signed: 11/12/2017 5:56:33 PM By: Worthy Keeler PA-C Entered By: Worthy Keeler on 11/08/2017 13:53:01 [name], [name] ([id number]) -------------------------------------------------------------------------------- Physical Exam Details Patient Name: [name], [name] Date of Service: 11/08/2017 1:00 PM Medical Record Number: [id number] Patient Account Number: [id number] Date of Birth/Sex: 08/31/1925 (84 y.o. M) Treating RN: Montey Hora Primary Care Provider: Ramonita Lab Other Clinician: Referring Provider: Ramonita Lab Treating  Provider/Extender: Melburn Hake, HOYT Weeks in Treatment: 18 Constitutional Well-nourished and well-hydrated in no acute distress. Respiratory normal breathing without difficulty. Psychiatric this patient is able to make decisions and demonstrates good insight into disease process. Alert and Oriented x 3. pleasant and cooperative. Notes Patients ones on the bilateral heels that require sharp debris but today which he tolerated without complication. Post debridement both appeared to be doing much better although the right obviously was better than the left. The depth of the left ulcer was worse post-debridement but again this is likewise also expected. Electronic Signature(s) Signed: 11/12/2017 5:56:33 PM By: Worthy Keeler PA-C Entered By: Worthy Keeler on 11/08/2017 13:54:02 [name], [name] ([id number]) -------------------------------------------------------------------------------- Physician Orders Details Patient Name: [name], [name] Date of Service: 11/08/2017 1:00 PM Medical Record Number: [id number] Patient Account Number: [id number] Date of Birth/Sex: 04-Nov-1925 (84 y.o. M) Treating RN: Montey Hora Primary Care Provider: Ramonita Lab Other Clinician: Referring Provider: Ramonita Lab Treating Provider/Extender: Melburn Hake, HOYT Weeks in Treatment: 42 Verbal / Phone Orders: No Diagnosis Coding ICD-10 Coding Code Description L89.620 Pressure ulcer of left heel, unstageable L89.610 Pressure ulcer of right heel, unstageable I48.2 Chronic atrial fibrillation I10 Essential (primary) hypertension Z79.01 Long term (current) use of anticoagulants Wound Cleansing Wound #1 Left Calcaneus o Clean wound with Normal Saline. o Cleanse wound with mild soap and water o May Shower, gently pat wound dry prior to applying new dressing. Wound #2 Right Calcaneus o Clean wound with Normal Saline. o Cleanse wound with mild soap and water o May Shower, gently pat wound dry prior to  applying new dressing. Wound #3 Toe Great o Clean wound with Normal Saline. o Cleanse wound with mild soap and water o May Shower, gently pat wound dry prior to applying new dressing. Primary Wound Dressing Wound #1 Left Calcaneus o Iodoflex Wound #2 Right Calcaneus o Iodoflex Wound #3 Toe Great o Other: - provodone-iodine Secondary  Dressing Wound #1 Left Calcaneus o Dry Gauze o Foam Wound #2 Right Calcaneus o Dry Gauze o Foam [name], Thayden L. ([id number]) Wound #3 Toe Great o Other - no bandages Dressing Change Frequency Wound #1 Left Calcaneus o Change dressing every day. o Change dressing twice daily. - morning and evening Wound #2 Right Calcaneus o Change dressing every day. Wound #3 Toe Great o Change dressing every day. Follow-up Appointments Wound #1 Left Calcaneus o Return Appointment in 2 weeks. Wound #2 Right Calcaneus o Return Appointment in 2 weeks. Wound #3 Toe Great o Return Appointment in 2 weeks. Off-Loading Wound #1 Left Calcaneus o Heel suspension boot to: - Please order prevalon boots for bilateral feet Please float heel when pt is in the bed (NO PRESSURE ON HEELS) o Turn and reposition every 2 hours o Other: - Adjustable Blanket Lift Bar so the sheets and blanket does not put pressure on the toes Wound #2 Right Calcaneus o Heel suspension boot to: - Please order prevalon boots for bilateral feet Please float heel when pt is in the bed (NO PRESSURE ON HEELS) o Turn and reposition every 2 hours o Other: - Adjustable Blanket Lift Bar so the sheets and blanket does not put pressure on the toes Wound #3 Toe Great o Heel suspension boot to: - Please order prevalon boots for bilateral feet Please float heel when pt is in the bed (NO PRESSURE ON HEELS) o Turn and reposition every 2 hours o Other: - Adjustable Blanket Lift Bar so the sheets and blanket does not put pressure on the toes Additional  Orders / Instructions Wound #1 Left Calcaneus o Increase protein intake. Wound #2 Right Calcaneus o Increase protein intake. Wound #3 Toe Great o Increase protein intake. [place] ([id number]) Wound #1 Left [place] Nurse may visit PRN to address patientos wound care needs. o FACE TO FACE ENCOUNTER: MEDICARE and MEDICAID PATIENTS: I certify that this patient is under my care and that I had a face-to-face encounter that meets the physician face-to-face encounter requirements with this patient on this date. The encounter with the patient was in whole or in part for the following MEDICAL CONDITION: (primary reason for [place]) MEDICAL NECESSITY: I certify, that based on my findings, NURSING services are a medically necessary home health service. HOME BOUND STATUS: I certify that my clinical findings support that this patient is homebound (i.e., Due to illness or injury, pt requires aid of supportive devices such as crutches, cane, wheelchairs, walkers, the use of special transportation or the assistance of another person to leave their place of residence. There is a normal inability to leave the home and doing so requires considerable and taxing effort. Other absences are for medical reasons / religious services and are infrequent or of short duration when for other reasons). o If current dressing causes regression in wound condition, may D/C ordered dressing product/s and apply Normal Saline Moist Dressing daily until next [place] / Other MD appointment. [place] of regression in wound condition at [phone]. o Please direct any NON-WOUND related issues/requests for orders to patient's Primary Care Physician Wound #2 Right [place] Nurse may visit PRN to address patientos wound care needs. o FACE TO FACE ENCOUNTER:  MEDICARE and MEDICAID PATIENTS: I certify that this patient is under my care and that I had a face-to-face encounter that meets the physician  face-to-face encounter requirements with this patient on this date. The encounter with the patient was in whole or in part for the following MEDICAL CONDITION: (primary reason for [place]) MEDICAL NECESSITY: I certify, that based on my findings, NURSING services are a medically necessary home health service. HOME BOUND STATUS: I certify that my clinical findings support that this patient is homebound (i.e., Due to illness or injury, pt requires aid of supportive devices such as crutches, cane, wheelchairs, walkers, the use of special transportation or the assistance of another person to leave their place of residence. There is a normal inability to leave the home and doing so requires considerable and taxing effort. Other absences are for medical reasons / religious services and are infrequent or of short duration when for other reasons). o If current dressing causes regression in wound condition, may D/C ordered dressing product/s and apply Normal Saline Moist Dressing daily until next [place] / Other MD appointment. [place] of regression in wound condition at [phone]. o Please direct any NON-WOUND related issues/requests for orders to patient's Primary Care Physician Wound #3 [place] Nurse may visit PRN to address patientos wound care needs. o FACE TO FACE ENCOUNTER: MEDICARE and MEDICAID PATIENTS: I certify that this patient is under my care and that I had a face-to-face encounter that meets the physician face-to-face encounter requirements with this patient on this date. The encounter with the patient was in whole or in part for the following MEDICAL CONDITION: (primary reason for [place]) MEDICAL NECESSITY: I certify, that based on my  findings, NURSING services are a medically necessary home health service. HOME BOUND STATUS: I certify that my clinical findings support that this patient is homebound (i.e., Due to illness or injury, pt requires aid of supportive devices such as crutches, cane, wheelchairs, walkers, the use of special transportation or the assistance of another person to leave their place of residence. There is a normal inability to leave the home and doing so requires considerable and taxing effort. Other absences are for medical reasons / religious services and are infrequent or of short duration when for other reasons). o If current dressing causes regression in wound condition, may D/C ordered dressing product/s and apply Normal Saline Moist Dressing daily until next [place] / Other MD appointment. [place] of regression in wound condition at [phone]. o Please direct any NON-WOUND related issues/requests for orders to patient's Primary Care Physician Electronic Signature(s) Signed: 11/08/2017 4:48:08 PM By: Montey Hora Signed: 11/12/2017 5:56:33 PM By: Worthy Keeler PA-C [place], [place] ([id number]) Entered By: Montey Hora on 11/08/2017 13:40:46 [place], [place] ([id number]) -------------------------------------------------------------------------------- Problem List Details Patient Name: GIANNELLI, Phoenyx L. Date of Service: 11/08/2017 1:00 PM Medical Record Number: [id number] Patient Account Number: [id number] Date of Birth/Sex: 03/13/1926 (84 y.o. M) Treating RN: Montey Hora Primary Care Provider: Ramonita Lab Other Clinician: Referring Provider: Ramonita Lab Treating Provider/Extender: Melburn Hake, HOYT Weeks in Treatment: 11 Active Problems ICD-10 Impacting Encounter Code Description Active Date Wound Healing Diagnosis L89.620 Pressure ulcer of left heel, unstageable 08/19/2017 Yes L89.610 Pressure ulcer of right heel, unstageable 08/19/2017 Yes I48.2  Chronic atrial fibrillation 08/19/2017 Yes I10 Essential (primary) hypertension 08/19/2017 Yes Z79.01 Long term (current) use of anticoagulants 08/19/2017 Yes Inactive Problems Resolved Problems Electronic Signature(s) Signed: 11/12/2017 5:56:33 PM By: Worthy Keeler PA-C Entered By: Worthy Keeler on 11/08/2017 12:58:08 [place], [place] ([id number]) -------------------------------------------------------------------------------- Progress  Note Details Patient Name: SMALDONE, Markas L. Date of Service: 11/08/2017 1:00 PM Medical Record Number: [id number] Patient Account Number: [id number] Date of Birth/Sex: 1926-04-21 (84 y.o. M) Treating RN: Montey Hora Primary Care Provider: Ramonita Lab Other Clinician: Referring Provider: Ramonita Lab Treating Provider/Extender: Melburn Hake, HOYT Weeks in Treatment: 11 Subjective Chief Complaint Information obtained from Patient Bilateral Heel pressure ulcers and bilateral great toe DTIs History of Present Illness (HPI) The following HPI elements were documented for the patient's wound: Associated Signs and Symptoms: Patient has a history of chronic atrophic relation, hypertension, and long-term use of anticoagulants. He also had a fall with pelvic fracture which occurred on January 2019. 08/19/17 patient presents today for initial evaluation and our clinic concerning issues he has been having with the bilateral heels as well as the lateral great toe was since he was admitted to the nursing facility following a pelvic fracture. Initially he was spending a significant amount of time in the bed although he is now up more often since the healing process has progressed along the way obviously. When he is up he's spending most of the time sitting in a wheelchair at this point. When he is in bed he lays on his back and the covers/sheets are pushing down on his toes bilaterally which I think it may be what's causing the issues with his deep tissue injury to the  bilateral great toes. With that being said the hills also appear to be pressure in nature and I think that laying in the bed getting pressure to the sites is what has led to the formation of these on stage will pressure ulcers. Fortunately there does not appear to be any infection in both areas of ulceration of the heel appear to be stable at this point. Patient does not have any significant pain at the site she does have some of the eschar along the edges which is starting to lift up and come all hopefully slowly but surely this will occur. Patient did have arterial studies performed April 2018 which showed a normal TBI on the right knee slightly depressed TBI on the left but this does not appear to have changed significantly at that point. Specifically this was 0.75 on the right and 0.61 on the left. 09/13/17 on evaluation today patient presents for a follow-up evaluation regarding his bilateral heal ulcers. He has been tolerating the dressing changes fairly well although he did have some bleeding earlier today when it appears some of the skin on the surrounding portion of the wound got pulled where this is starting to flake off. Subsequently there does not appear to be any bleeding right now. Since I last saw him he has been discharged from the facility and is now back at home. His wife is having a very difficult time being able to get him up and in the car in order to come for an appointment which is why they missed the last appointment have not been able to come back sooner. Fortunately the he does not have any signs of infection. No fevers chills noted. He does have difficulty walking due to pain in the bilateral heels. 10/11/17 on evaluation today patient presents for follow-up evaluation concerning his ongoing bilateral heal ulcers. He has been tolerating the dressing changes which were Betadine paints daily without complication. I have not seen him since September 13, 2017. Pressure that I saw him  August 19, 2017. Subsequently those are the only prior visits before today. His left heel actually seems  to have a significant amount of eschar that is starting to lift up at this point and seems to be a little more loose as far as drainage building up underneath the eschar. The right eschar though there are some parts lifting up seems to still be fairly stable which is good news. His toe ulcer is almost completely healed a lot of that eschar came off and it looks very well underneath. 10/25/17 on evaluation today patient presents today regarding his bilateral heal ulcers in his right great toe ulcer. At this point his heels actually have loosened and softened to the point that I think we do need to remove these at this time. With that being said I think this will actually help them to heal much better and much more quickly currently. With that being said patient was a little bit worried about the debridement as far as pain was concerned fortunately this seemed to go very well today. 11/08/17 evaluation today patient's hills actually appear to be doing better compared to last 2 week's evaluation. The right heel [place], [place] ([id number]) especially is doing great and show signs of excellent granulation at this time. With that being said the left heel is much deeper and I was able to clean away a lot of the slough and subcutaneous tissue at this point he still has some noted that will take time to clear away. With that being said he has been tolerating the dressing changes will complication the doesn't of been some issues with getting the proper supplies I'm not exactly sure what the issue is there however we are to contact home health and try to figure this out. In the meantime fortunately he does not seem to have any significant pain on the right he does have some pain walking on the left which makes sense. Patient History Information obtained from Patient. Family History No family history of  Cancer, Diabetes, Heart Disease, Hereditary Spherocytosis, Hypertension, Kidney Disease, Lung Disease, Seizures, Stroke, Thyroid Problems, Tuberculosis. Social History Former smoker, Marital Status - Married, Alcohol Use - Rarely - 2oz day, Drug Use - No History, Caffeine Use - Daily. Review of Systems (ROS) Constitutional Symptoms (General Health) Denies complaints or symptoms of Fever, Chills. Respiratory The patient has no complaints or symptoms. Cardiovascular The patient has no complaints or symptoms. Psychiatric The patient has no complaints or symptoms. Objective Constitutional Well-nourished and well-hydrated in no acute distress. Vitals Time Taken: 1:00 PM, Height: 70 in, Weight: 190 lbs, BMI: 27.3, Temperature: 97.6 F, Pulse: 69 bpm, Respiratory Rate: 16 breaths/min, Blood Pressure: 106/66 mmHg. Respiratory normal breathing without difficulty. Psychiatric this patient is able to make decisions and demonstrates good insight into disease process. Alert and Oriented x 3. pleasant and cooperative. General Notes: Patients ones on the bilateral heels that require sharp debris but today which he tolerated without complication. Post debridement both appeared to be doing much better although the right obviously was better than the left. The depth of the left ulcer was worse post-debridement but again this is likewise also expected. Integumentary (Hair, Skin) Wound #1 status is Open. Original cause of wound was Pressure Injury. The wound is located on the Left Calcaneus. The Avallone, [place] ([id number]) wound measures 3cm length x 4cm width x 0.6cm depth; 9.425cm^2 area and 5.655cm^3 volume. There is Fat Layer (Subcutaneous Tissue) Exposed exposed. There is no tunneling or undermining noted. There is a large amount of serosanguineous drainage noted. The wound margin is distinct with the outline attached to  the wound base. There is small (1-33%) red, hyper - granulation within the  wound bed. There is a large (67-100%) amount of necrotic tissue within the wound bed including Eschar. The periwound skin appearance exhibited: Callus, Dry/Scaly. Periwound temperature was noted as No Abnormality. The periwound has tenderness on palpation. Wound #2 status is Open. Original cause of wound was Pressure Injury. The wound is located on the Right Calcaneus. The wound measures 1.8cm length x 2.1cm width x 0.1cm depth; 2.969cm^2 area and 0.297cm^3 volume. There is Fat Layer (Subcutaneous Tissue) Exposed exposed. There is no tunneling or undermining noted. There is a large amount of serosanguineous drainage noted. The wound margin is thickened. There is no granulation within the wound bed. There is a large (67-100%) amount of necrotic tissue within the wound bed including Eschar. The periwound skin appearance exhibited: Dry/Scaly. Periwound temperature was noted as No Abnormality. The periwound has tenderness on palpation. Wound #3 status is Open. Original cause of wound was Pressure Injury. The wound is located on the [employer] The wound measures 0.1cm length x 0.1cm width x 0.1cm depth; 0.008cm^2 area and 0.001cm^3 volume. There is no tunneling or undermining noted. There is a none present amount of drainage noted. The wound margin is flat and intact. There is no granulation within the wound bed. There is no necrotic tissue within the wound bed. The periwound skin appearance did not exhibit: Callus, Crepitus, Excoriation, Induration, Rash, Scarring, Dry/Scaly, Maceration, Atrophie Blanche, Cyanosis, Ecchymosis, Hemosiderin Staining, Mottled, Pallor, Rubor, Erythema. Periwound temperature was noted as No Abnormality. Assessment Active Problems ICD-10 L89.620 - Pressure ulcer of left heel, unstageable L89.610 - Pressure ulcer of right heel, unstageable I48.2 - Chronic atrial fibrillation I10 - Essential (primary) hypertension Z79.01 - Long term (current) use of  anticoagulants Procedures Wound #1 Pre-procedure diagnosis of Wound #1 is a Pressure Ulcer located on the Left Calcaneus . There was a Excisional Skin/Subcutaneous Tissue Debridement with a total area of 12 sq cm performed by STONE III, HOYT E., PA-C. With the following instrument(s): Curette. to remove Viable and Non-Viable tissue/material Material removed includes Subcutaneous Tissue, and Slough, Fibrin/Exudate, and [place] after achieving pain control using Lidocaine 4% Topical Solution. No specimens were taken. A time out was conducted at 13:33, prior to the start of the procedure. A Minimum amount of bleeding was controlled with Pressure. The procedure was tolerated well with a pain level of 0 throughout and a pain level of 0 following the procedure. Post Debridement Measurements: 3cm length x 4cm width x 0.9cm depth; 8.482cm^3 volume. Post debridement Stage noted as Unstageable/Unclassified. Character of Wound/Ulcer Post Debridement is improved. Post procedure Diagnosis Wound #1: Same as Pre-Procedure Wound #2 Pre-procedure diagnosis of Wound #2 is a Pressure Ulcer located on the Right Calcaneus . There was a Excisional Skin/Subcutaneous Tissue Debridement with a total area of 3.78 sq cm performed by STONE III, HOYT E., PA-C. With the Mickiewicz, [place] ([id number]) following instrument(s): Curette. to remove Viable and Non-Viable tissue/material Material removed includes Subcutaneous Tissue, and Slough, Fibrin/Exudate, and [place] after achieving pain control using Lidocaine 4% Topical Solution. No specimens were taken. A time out was conducted at 13:30, prior to the start of the procedure. A Minimum amount of bleeding was controlled with Pressure. The procedure was tolerated well with a pain level of 0 throughout and a pain level of 0 following the procedure. Post Debridement Measurements: 1.8cm length x 2.1cm width x 0.4cm depth; 1.188cm^3 volume. Post debridement Stage noted as  Unstageable/Unclassified. Character of  Wound/Ulcer Post Debridement is improved. Post procedure Diagnosis Wound #2: Same as Pre-Procedure Plan Wound Cleansing: Wound #1 Left Calcaneus: Clean wound with Normal Saline. Cleanse wound with mild soap and water May Shower, gently pat wound dry prior to applying new dressing. Wound #2 Right Calcaneus: Clean wound with Normal Saline. Cleanse wound with mild soap and water May Shower, gently pat wound dry prior to applying new dressing. Wound #3 Toe Great: Clean wound with Normal Saline. Cleanse wound with mild soap and water May Shower, gently pat wound dry prior to applying new dressing. Primary Wound Dressing: Wound #1 Left Calcaneus: Iodoflex Wound #2 Right Calcaneus: Iodoflex Wound #3 Toe Great: Other: - provodone-iodine Secondary Dressing: Wound #1 Left Calcaneus: Dry Gauze Foam Wound #2 Right Calcaneus: Dry Gauze Foam Wound #3 Toe Great: Other - no bandages Dressing Change Frequency: Wound #1 Left Calcaneus: Change dressing every day. Change dressing twice daily. - morning and evening Wound #2 Right Calcaneus: Change dressing every day. Wound #3 Toe Great: Change dressing every day. Follow-up Appointments: Wound #1 Left Calcaneus: Return Appointment in 2 weeks. Wound #2 Right Calcaneus: Return Appointment in 2 weeks. Wound #3 Toe Great: Bernabei, [place] ([id number]) Return Appointment in 2 weeks. Off-Loading: Wound #1 Left Calcaneus: Heel suspension boot to: - Please order prevalon boots for bilateral feet Please float heel when pt is in the bed (NO PRESSURE ON HEELS) Turn and reposition every 2 hours Other: - Adjustable Blanket Lift Bar so the sheets and blanket does not put pressure on the toes Wound #2 Right Calcaneus: Heel suspension boot to: - Please order prevalon boots for bilateral feet Please float heel when pt is in the bed (NO PRESSURE ON HEELS) Turn and reposition every 2 hours Other: - Adjustable  Blanket Lift Bar so the sheets and blanket does not put pressure on the toes Wound #3 Toe Great: Heel suspension boot to: - Please order prevalon boots for bilateral feet Please float heel when pt is in the bed (NO PRESSURE ON HEELS) Turn and reposition every 2 hours Other: - Adjustable Blanket Lift Bar so the sheets and blanket does not put pressure on the toes Additional Orders / Instructions: Wound #1 Left Calcaneus: Increase protein intake. Wound #2 Right Calcaneus: Increase protein intake. Wound #3 Toe Great: Increase protein intake. Home Health: Wound #1 Left Calcaneus: [place] Nurse may visit PRN to address patient s wound care needs. FACE TO FACE ENCOUNTER: MEDICARE and MEDICAID PATIENTS: I certify that this patient is under my care and that I had a face-to-face encounter that meets the physician face-to-face encounter requirements with this patient on this date. The encounter with the patient was in whole or in part for the following MEDICAL CONDITION: (primary reason for [place]) MEDICAL NECESSITY: I certify, that based on my findings, NURSING services are a medically necessary home health service. HOME BOUND STATUS: I certify that my clinical findings support that this patient is homebound (i.e., Due to illness or injury, pt requires aid of supportive devices such as crutches, cane, wheelchairs, walkers, the use of special transportation or the assistance of another person to leave their place of residence. There is a normal inability to leave the home and doing so requires considerable and taxing effort. Other absences are for medical reasons / religious services and are infrequent or of short duration when for other reasons). If current dressing causes regression in wound condition, may D/C ordered dressing product/s and apply Normal Saline Moist Dressing  daily until next [place] / Other MD appointment. [place] of regression in wound condition at [phone]. Please direct any NON-WOUND related issues/requests for orders to patient's Primary Care Physician Wound #2 Right Calcaneus: [place] Nurse may visit PRN to address patient s wound care needs. FACE TO FACE ENCOUNTER: MEDICARE and MEDICAID PATIENTS: I certify that this patient is under my care and that I had a face-to-face encounter that meets the physician face-to-face encounter requirements with this patient on this date. The encounter with the patient was in whole or in part for the following MEDICAL CONDITION: (primary reason for [place]) MEDICAL NECESSITY: I certify, that based on my findings, NURSING services are a medically necessary home health service. HOME BOUND STATUS: I certify that my clinical findings support that this patient is homebound (i.e., Due to illness or injury, pt requires aid of supportive devices such as crutches, cane, wheelchairs, walkers, the use of special transportation or the assistance of another person to leave their place of residence. There is a normal inability to leave the home and doing so requires considerable and taxing effort. Other absences are for medical reasons / religious services and are infrequent or of short duration when for other reasons). If current dressing causes regression in wound condition, may D/C ordered dressing product/s and apply Normal Saline Moist Dressing daily until next [place] / Other MD appointment. [place] of regression in wound condition at [phone]. Please direct any NON-WOUND related issues/requests for orders to patient's Primary Care Physician Wound #3 Toe Great: [place] Nurse may visit PRN to address patient s wound care needs. Snead, Cirilo L. ([id number]) FACE TO FACE ENCOUNTER: MEDICARE and MEDICAID PATIENTS: I certify that this patient is under my care  and that I had a face-to-face encounter that meets the physician face-to-face encounter requirements with this patient on this date. The encounter with the patient was in whole or in part for the following MEDICAL CONDITION: (primary reason for [place]) MEDICAL NECESSITY: I certify, that based on my findings, NURSING services are a medically necessary home health service. HOME BOUND STATUS: I certify that my clinical findings support that this patient is homebound (i.e., Due to illness or injury, pt requires aid of supportive devices such as crutches, cane, wheelchairs, walkers, the use of special transportation or the assistance of another person to leave their place of residence. There is a normal inability to leave the home and doing so requires considerable and taxing effort. Other absences are for medical reasons / religious services and are infrequent or of short duration when for other reasons). If current dressing causes regression in wound condition, may D/C ordered dressing product/s and apply Normal Saline Moist Dressing daily until next [place] / Other MD appointment. [place] of regression in wound condition at [phone]. Please direct any NON-WOUND related issues/requests for orders to patient's Primary Care Physician I'm gonna suggest currently that we continue with the Current wound care measures will try to figure out what's going on with his dressings in order to ensure that he is getting the proper dressings from home health. Nonetheless hopefully this will continue to show signs of improvement I did explain that it's gonna take much more time for the left heel to completely resolve compared to the right just based on the depth of the wound even once it finally does get cleaned fully. They  understand. If anything changes that will contact the office and let us know. Please see above for specific wound care orders. We will see patient for  re-evaluation in 2 week(s) here in the clinic. If anything worsens or changes patient will contact our office for additional recommendations. Electronic Signature(s) Signed: 11/12/2017 5:56:33 PM By: Worthy Keeler PA-C Entered By: Worthy Keeler on 11/08/2017 13:54:42 Syme, Ezrael L. ([id number]) -------------------------------------------------------------------------------- ROS/PFSH Details Patient Name: VALENTINE, Alfreddie L. Date of Service: 11/08/2017 1:00 PM Medical Record Number: [id number] Patient Account Number: [id number] Date of Birth/Sex: 1926/04/12 (84 y.o. M) Treating RN: Montey Hora Primary Care Provider: [facility] Other Clinician: Referring Provider: [facility] Treating Provider/Extender: Melburn Hake, HOYT Weeks in Treatment: 11 Information Obtained From Patient Wound History Do you currently have one or more open woundso Yes How many open wounds do you currently haveo 2 Approximately how long have you had your woundso 1 week How have you been treating your wound(s) until nowo nothing Has your wound(s) ever healed and then re-openedo No Have you had any lab work done in the past montho No Have you tested positive for osteomyelitis (bone infection)o No Have you had any tests for circulation on your legso No Have you had other problems associated with your woundso Swelling Constitutional Symptoms (General Health) Complaints and Symptoms: Negative for: Fever; Chills Respiratory Complaints and Symptoms: No Complaints or Symptoms Cardiovascular Complaints and Symptoms: No Complaints or Symptoms Medical History: Positive for: Arrhythmia - a-fib; Hypertension Oncologic Medical History: Positive for: Received Radiation Psychiatric Complaints and Symptoms: No Complaints or Symptoms Immunizations Pneumococcal Vaccine: Received Pneumococcal Vaccination: Yes Implantable Devices Family and Social History Tinner, Dvon L. ([id number]) Cancer: No; Diabetes: No; Heart  Disease: No; Hereditary Spherocytosis: No; Hypertension: No; Kidney Disease: No; Lung Disease: No; Seizures: No; Stroke: No; Thyroid Problems: No; Tuberculosis: No; Former smoker; Marital Status - Married; Alcohol Use: Rarely - 2oz day; Drug Use: No History; Caffeine Use: Daily; Financial Concerns: No; Food, Clothing or Shelter Needs: No; Support System Lacking: No; Transportation Concerns: No; Advanced Directives: Yes (Not Provided); Patient does not want information on Advanced Directives; Do not resuscitate: No; Living Will: Yes (Not Provided); Medical Power of Attorney: No Physician Affirmation I have reviewed and agree with the above information. Electronic Signature(s) Signed: 11/08/2017 4:48:08 PM By: Montey Hora Signed: 11/12/2017 5:56:33 PM By: Worthy Keeler PA-C Entered By: Worthy Keeler on 11/08/2017 13:53:22 Curto, Alford L. ([id number]) -------------------------------------------------------------------------------- SuperBill Details Patient Name: DUFFUS, Manjinder L. Date of Service: 11/08/2017 Medical Record Number: [id number] Patient Account Number: [id number] Date of Birth/Sex: 10/18/1925 (84 y.o. M) Treating RN: Montey Hora Primary Care Provider: [facility] Other Clinician: Referring Provider: [facility] Treating Provider/Extender: Melburn Hake, HOYT Weeks in Treatment: 11 Diagnosis Coding ICD-10 Codes Code Description L89.620 Pressure ulcer of left heel, unstageable L89.610 Pressure ulcer of right heel, unstageable I48.2 Chronic atrial fibrillation I10 Essential (primary) hypertension Z79.01 Long term (current) use of anticoagulants Facility Procedures CPT4 Code: [id number] Description: 09983 - DEB SUBQ TISSUE 20 SQ CM/< ICD-10 Diagnosis Description L89.620 Pressure ulcer of left heel, unstageable L89.610 Pressure ulcer of right heel, unstageable Modifier: Quantity: 1 Physician Procedures CPT4 Code: [id number] Description: 97673 - WC PHYS SUBQ TISS 20 SQ CM ICD-10  Diagnosis Description L89.620 Pressure ulcer of left heel, unstageable L89.610 Pressure ulcer of right heel, unstageable Modifier: Quantity: 1 Electronic Signature(s) Signed: 11/12/2017 5:56:33 PM By: Worthy Keeler PA-C Entered By: Worthy Keeler on 11/08/2017 13:55:39

## 2017-11-15 ENCOUNTER — Encounter: Payer: PPO | Admitting: Physician Assistant

## 2017-11-15 DIAGNOSIS — L89620 Pressure ulcer of left heel, unstageable: Secondary | ICD-10-CM | POA: Diagnosis not present

## 2017-11-15 DIAGNOSIS — L89610 Pressure ulcer of right heel, unstageable: Secondary | ICD-10-CM | POA: Diagnosis not present

## 2017-11-19 NOTE — Progress Notes (Signed)
[name], [name] ([id number]) Visit Report for 11/15/2017 Arrival Information Details Patient Name: [name], [name] Date of Service: 11/15/2017 2:30 PM Medical Record Number: [id number] Patient Account Number: [id number] Date of Birth/Sex: 1925/08/31 (84 y.o. M) Treating RN: [name] Primary Care [name]: [name] Other Clinician: Referring [name] Riccardi: [name] Treating [name]/Extender: [name], [name]: 12 Visit Information History Since Last Visit All ordered tests and consults were completed: No Patient Arrived: [name] Added or deleted any medications: No Arrival Time: 14:45 Any new allergies or adverse reactions: No Accompanied By: wife Had a fall or experienced change in No Transfer Assistance: EasyPivot Patient activities of daily living that may affect Lift risk of falls: Patient Identification Verified: Yes Signs or symptoms of abuse/neglect since last visito No Secondary Verification Process Yes Hospitalized since last visit: No Completed: Implantable device outside of the clinic excluding No Patient Requires Transmission-Based No cellular tissue based products placed in the center Precautions: since last visit: Patient Has Alerts: Yes Has Dressing in Place as Prescribed: Yes Patient Alerts: Patient on Blood Pain Present Now: No Thinner Warfarin Electronic Signature(s) Signed: 11/15/2017 4:00:06 PM By: [name] Entered By: [name] on 11/15/2017 14:45:38 Medeiros, [name] ([id number]) -------------------------------------------------------------------------------- Encounter Discharge Information Details Patient Name: [name], [name] Date of Service: 11/15/2017 2:30 PM Medical Record Number: [id number] Patient Account Number: [id number] Date of Birth/Sex: 1925-07-12 (84 y.o. M) Treating RN: [name] Primary Care [name]: [name] Other Clinician: Referring [name]: [name] Treating  [name]/Extender: [name], [name]: 12 Encounter Discharge Information Items Discharge Condition: Stable Ambulatory Status: Walker Discharge Destination: Home Transportation: Private Auto Accompanied By: wife Schedule Follow-up Appointment: Yes Clinical Summary of Care: Electronic Signature(s) Signed: 11/18/2017 4:06:44 PM By: [name] Entered By: [name] on 11/15/2017 16:05:40 Zurn, [name] -------------------------------------------------------------------------------- Lower Extremity Assessment Details Patient Name: [name], [name] Date of Service: 11/15/2017 2:30 PM Medical Record Number: [id number] Patient Account Number: [id number] Date of Birth/Sex: 02-18-1926 (84 y.o. M) Treating RN: [name] Primary Care Megin Consalvo: [name] Other Clinician: Referring Sokhna Christoph: [name] Treating [name]/Extender: [name], [name]: 12 Vascular Assessment Pulses: Dorsalis Pedis Palpable: [Left:Yes] [Right:Yes] Posterior Tibial Extremity colors, hair growth, and conditions: Extremity Color: [Left:Normal] [Right:Normal] Temperature of Extremity: [Left:Warm] [Right:Warm] Capillary Refill: [Left:< 3 seconds] [Right:< 3 seconds] Toe Nail Assessment Left: Right: Thick: Yes Yes Discolored: Yes Yes Deformed: Yes Yes Improper Length and Hygiene: Yes Yes Electronic Signature(s) Signed: 11/15/2017 4:00:06 PM By: [name] Entered By: [name] on 11/15/2017 14:58:44 Letizia, [name] -------------------------------------------------------------------------------- Multi Wound Chart Details Patient Name: [name], [name] Date of Service: 11/15/2017 2:30 PM Medical Record Number: [id number] Patient Account Number: [id number] Date of Birth/Sex: 09-25-1925 (84 y.o. M) Treating RN: [name] Primary Care [name]: [name] Other Clinician: Referring [name]: [name] Treating  [name]/Extender: [name], [name]: 12 Vital Signs Height(in): 70 Pulse(bpm): 70 Weight(lbs): 190 Blood Pressure(mmHg): 114/58 Body Mass Index(BMI): 27 Temperature(F): 97.5 Respiratory Rate 16 (breaths/min): Photos: [1:No Photos] [2:No Photos] [3:No Photos] Wound Location: [1:Left Calcaneus] [2:Right Calcaneus] [3:Right Toe Great] Wounding Event: [1:Pressure Injury] [2:Pressure Injury] [3:Pressure Injury] Primary Etiology: [1:Pressure Ulcer] [2:Pressure Ulcer] [3:Pressure Ulcer] Comorbid History: [1:Arrhythmia, Hypertension, Received Radiation] [2:Arrhythmia, Hypertension, Received Radiation] [3:Arrhythmia, Hypertension, Received Radiation] Date Acquired: [1:08/12/2017] [2:08/12/2017] [3:09/02/2017] Weeks of [name]: [1:12] [2:12] [3:9] Wound Status: [1:Open] [2:Open] [3:Open] Measurements L x W x D [1:3.3x4x0.9] [2:1.6x2.2x0.1] [3:0.1x0.1x0.1] (cm) Area (cm) : [1:10.367] [2:2.765] [3:0.008] Volume (cm) : [1:9.331] [2:0.276] [  3:0.001] % Reduction in Area: [1:54.70%] [2:75.90%] [3:95.90%] % Reduction in Volume: [1:-103.90%] [2:88.00%] [3:95.00%] Classification: [1:Unstageable/Unclassified] [2:Unstageable/Unclassified] [3:Category/Stage II] Exudate Amount: [1:Large] [2:Large] [3:None Present] Exudate Type: [1:Purulent] [2:Serosanguineous] [3:N/A] Exudate Color: [1:yellow, brown, green] [2:red, brown] [3:N/A] Wound Margin: [1:Distinct, outline attached] [2:Thickened] [3:Flat and Intact] Granulation Amount: [1:Small (1-33%)] [2:Large (67-100%)] [3:None Present (0%)] Granulation Quality: [1:Red] [2:Red] [3:N/A] Necrotic Amount: [1:Large (67-100%)] [2:Small (1-33%)] [3:Large (67-100%)] Necrotic Tissue: [1:Adherent Slough] [2:Adherent Slough] [3:Eschar] Exposed Structures: [1:Fat Layer (Subcutaneous Tissue) Exposed: Yes Fascia: No Tendon: No Muscle: No Joint: No Bone: No] [2:Fat Layer (Subcutaneous Tissue) Exposed: Yes Fascia: No Tendon: No Muscle: No Joint: No  Bone: No] [3:Fascia: No Fat Layer (Subcutaneous Tissue)  Exposed: No Tendon: No Muscle: No Joint: No Bone: No] Epithelialization: [1:None] [2:None] [3:Large (67-100%)] Periwound Skin Texture: [1:Callus: Yes] [2:No Abnormalities Noted] [3:Excoriation: No Induration: No Callus: No Crepitus: No] Rash: No Scarring: No Periwound Skin Moisture: Maceration: Yes Dry/Scaly: No Maceration: No Dry/Scaly: Yes Dry/Scaly: No Periwound Skin Color: No Abnormalities Noted No Abnormalities Noted Atrophie Blanche: No Cyanosis: No Ecchymosis: No Erythema: No Hemosiderin Staining: No Mottled: No Pallor: No Rubor: No Temperature: No Abnormality No Abnormality No Abnormality Tenderness on Palpation: Yes Yes No Wound Preparation: Ulcer Cleansing: Ulcer Cleansing: Ulcer Cleansing: Rinsed/Irrigated with Saline Rinsed/Irrigated with Saline Rinsed/Irrigated with Saline Topical Anesthetic Applied: Topical Anesthetic Applied: Topical Anesthetic Applied: Other: lidocaine 4% Other: lidocaine 4% None [name] Notes Electronic Signature(s) Signed: 11/15/2017 4:42:41 PM By: [name] Entered By: [name] on 11/15/2017 15:37:45 Span, Hernando L. ([id number]) -------------------------------------------------------------------------------- [place] Details Patient Name: [name], [name] Date of Service: 11/15/2017 2:30 PM Medical Record Number: [id number] Patient Account Number: [id number] Date of Birth/Sex: 13-Jan-1926 (84 y.o. M) Treating RN: [name] Primary Care Shamica Moree: [name] Other Clinician: Referring Darcy Cordner: [name] Treating Izola Teague/Extender: [name], [name]: 12 Active Inactive ` Abuse / Safety / Falls / Self Care Management Nursing Diagnoses: History of Falls Potential for falls Goals: Patient will not experience any injury related to falls Date Initiated: 08/19/2017 Target Resolution Date: 12/14/2017 Goal Status:  Active Interventions: Assess Activities of Daily Living upon admission and as needed Assess fall risk on admission and as needed Assess: immobility, friction, shearing, incontinence upon admission and as needed Assess impairment of mobility on admission and as needed per policy Assess personal safety and home safety (as indicated) on admission and as needed Assess self care needs on admission and as needed Notes: ` Nutrition Nursing Diagnoses: Imbalanced nutrition Potential for alteratiion in Nutrition/Potential for imbalanced nutrition Goals: Patient/caregiver agrees to and verbalizes understanding of need to use nutritional supplements and/or vitamins as prescribed Date Initiated: 08/19/2017 Target Resolution Date: 11/16/2017 Goal Status: Active Interventions: Assess patient nutrition upon admission and as needed per policy Notes: ` Orientation to the Wound Care Program Nursing Diagnoses: Bonilla, Thorvald L. ([id number]) Knowledge deficit related to the wound healing center program Goals: Patient/caregiver will verbalize understanding of the [place] Date Initiated: 08/19/2017 Target Resolution Date: 09/14/2017 Goal Status: Active Interventions: Provide education on orientation to the wound center Notes: ` Pain, Acute or Chronic Nursing Diagnoses: Pain, acute or chronic: actual or potential Potential alteration in comfort, pain Goals: Patient/caregiver will verbalize adequate pain control between visits Date Initiated: 08/19/2017 Target Resolution Date: 12/14/2017 Goal Status: Active Interventions: Complete pain assessment as per visit requirements Notes: ` Pressure Nursing Diagnoses: Knowledge deficit related to causes and risk factors for pressure ulcer development Knowledge deficit related to management of pressures ulcers Potential for  impaired tissue integrity related to pressure, friction, moisture, and shear Goals: Patient will remain free from  development of additional pressure ulcers Date Initiated: 08/19/2017 Target Resolution Date: 12/14/2017 Goal Status: Active Interventions: Assess: immobility, friction, shearing, incontinence upon admission and as needed Assess potential for pressure ulcer upon admission and as needed Notes: ` Wound/Skin Impairment Nursing Diagnoses: Impaired tissue integrity Knowledge deficit related to ulceration/compromised skin integrity [name], [name] ([id number]) Goals: Ulcer/skin breakdown will have a volume reduction of 80% by week 12 Date Initiated: 08/19/2017 Target Resolution Date: 12/14/2017 Goal Status: Active Interventions: Assess patient/caregiver ability to perform ulcer/skin care regimen upon admission and as needed Assess ulceration(s) every visit Notes: Electronic Signature(s) Signed: 11/15/2017 4:42:41 PM By: [name] Entered By: [name] on 11/15/2017 15:37:34 [name], [name] ([id number]) -------------------------------------------------------------------------------- Pain Assessment Details Patient Name: [name], [name] Date of Service: 11/15/2017 2:30 PM Medical Record Number: [id number] Patient Account Number: [id number] Date of Birth/Sex: [birth year] (84 y.o. M) Treating RN: [name] Primary Care Jazlen Ogarro: [name] Other Clinician: Referring Latiffany Harwick: [name] Treating Akima Slaugh/Extender: [name], [name]: 12 Active Problems Location of Pain Severity and Description of Pain Patient Has Paino No Site Locations Pain Management and Medication Current Pain Management: Electronic Signature(s) Signed: 11/15/2017 4:00:06 PM By: [name] Entered By: [name] on 11/15/2017 14:45:45 [name], [name] ([id number]) -------------------------------------------------------------------------------- Patient/Caregiver Education Details Patient Name: KRISHAWN, VANDERWEELE L. Date of Service: 11/15/2017 2:30 PM Medical Record Number:  [id number] Patient Account Number: [id number] Date of Birth/Gender: [birth year] (84 y.o. M) Treating RN: [name] Primary Care Physician: [name] Other Clinician: Referring Physician: Ramonita [name] Treating Physician/Extender: Sharalyn Ink in [name]: 12 Education Assessment Education Provided To: Patient Education Topics Provided Wound Debridement: Handouts: Wound Debridement Methods: Explain/Verbal Responses: State content correctly Wound/Skin Impairment: Handouts: Caring for Your Ulcer Methods: Explain/Verbal Responses: State content correctly Electronic Signature(s) Signed: 11/18/2017 4:06:44 PM By: [name] Entered By: [name] on 11/15/2017 16:05:55 [name], [name] ([id number]) -------------------------------------------------------------------------------- Wound Assessment Details Patient Name: [name], [name] Date of Service: 11/15/2017 2:30 PM Medical Record Number: [id number] Patient Account Number: [id number] Date of Birth/Sex: 28-Aug-1925 (84 y.o. M) Treating RN: [name] Primary Care Kwynn Schlotter: [name] Other Clinician: Referring Itzae Mccurdy: [name] Treating Adley Mazurowski/Extender: [name], [name]: 12 Wound Status Wound Number: 1 Primary Etiology: Pressure Ulcer Wound Location: Left Calcaneus Wound Status: Open Wounding Event: Pressure Injury Comorbid Arrhythmia, Hypertension, Received History: Radiation Date Acquired: 08/12/2017 Weeks Of [name]: 12 Clustered Wound: No Photos Photo Uploaded By: [name] on 11/18/2017 15:27:39 Wound Measurements Length: (cm) 3.3 Width: (cm) 4 Depth: (cm) 0.9 Area: (cm) 10.367 Volume: (cm) 9.331 % Reduction in Area: 54.7% % Reduction in Volume: -103.9% Epithelialization: None Tunneling: No Undermining: No Wound Description Classification: Unstageable/Unclassified Wound Margin: Distinct, outline attached Exudate Amount: Large Exudate Type:  Purulent Exudate Color: yellow, brown, green Foul Odor After Cleansing: No Slough/Fibrino Yes Wound Bed Granulation Amount: Small (1-33%) Exposed Structure Granulation Quality: Red Fascia Exposed: No Necrotic Amount: Large (67-100%) Fat Layer (Subcutaneous Tissue) Exposed: Yes Necrotic Quality: Adherent Slough Tendon Exposed: No Muscle Exposed: No Joint Exposed: No Bone Exposed: No Periwound Skin Texture Texture Color No Abnormalities Noted: No No Abnormalities Noted: No Callus: Yes Temperature / Pain [name], Casson L. ([id number]) Moisture Temperature: No Abnormality No Abnormalities Noted: No Tenderness on Palpation: Yes Dry / Scaly: Yes Maceration: Yes Wound Preparation Ulcer Cleansing: Rinsed/Irrigated with Saline Topical Anesthetic Applied: Other: lidocaine 4%, [name] Notes Wound #1 (Left Calcaneus) 1.  Cleansed with: Clean wound with Normal Saline 2. Anesthetic Topical Lidocaine 4% cream to wound bed prior to debridement 4. Dressing Applied: Iodoflex 5. Secondary Dressing Applied Dry Gauze Notes dry gauze heel cup, kerlix and coban Electronic Signature(s) Signed: 11/15/2017 4:00:06 PM By: [name] Entered By: [name] on 11/15/2017 14:56:24 Zellman, Zebedee L. ([id number]) -------------------------------------------------------------------------------- Wound Assessment Details Patient Name: KOOPMANN, Bobbye L. Date of Service: 11/15/2017 2:30 PM Medical Record Number: [id number] Patient Account Number: [id number] Date of Birth/Sex: 08/05/1925 (84 y.o. M) Treating RN: [name] Primary Care Montez Cuda: [name] Other Clinician: Referring Persephone Schriever: [name] Treating Kelani Robart/Extender: [name], [name]: 12 Wound Status Wound Number: 2 Primary Etiology: Pressure Ulcer Wound Location: Right Calcaneus Wound Status: Open Wounding Event: Pressure Injury Comorbid Arrhythmia, Hypertension, Received History: Radiation Date  Acquired: 08/12/2017 Weeks Of [name]: 12 Clustered Wound: No Photos Photo Uploaded By: [name] on 11/18/2017 15:27:39 Wound Measurements Length: (cm) 1.6 Width: (cm) 2.2 Depth: (cm) 0.1 Area: (cm) 2.765 Volume: (cm) 0.276 % Reduction in Area: 75.9% % Reduction in Volume: 88% Epithelialization: None Tunneling: No Undermining: No Wound Description Classification: Unstageable/Unclassified Wound Margin: Thickened Exudate Amount: Large Exudate Type: Serosanguineous Exudate Color: red, brown Foul Odor After Cleansing: No Slough/Fibrino Yes Wound Bed Granulation Amount: Large (67-100%) Exposed Structure Granulation Quality: Red Fascia Exposed: No Necrotic Amount: Small (1-33%) Fat Layer (Subcutaneous Tissue) Exposed: Yes Necrotic Quality: Adherent Slough Tendon Exposed: No Muscle Exposed: No Joint Exposed: No Bone Exposed: No Periwound Skin Texture Texture Color No Abnormalities Noted: No No Abnormalities Noted: No Moisture Temperature / Pain Depaolis, Chimaobi L. ([id number]) No Abnormalities Noted: No Temperature: No Abnormality Dry / Scaly: No Tenderness on Palpation: Yes Wound Preparation Ulcer Cleansing: Rinsed/Irrigated with Saline Topical Anesthetic Applied: Other: lidocaine 4%, [name] Notes Wound #2 (Right Calcaneus) 1. Cleansed with: Clean wound with Normal Saline 2. Anesthetic Topical Lidocaine 4% cream to wound bed prior to debridement 4. Dressing Applied: Iodoflex 5. Secondary Dressing Applied Dry Gauze Notes dry gauze heel cup, kerlix and coban Electronic Signature(s) Signed: 11/15/2017 4:00:06 PM By: [name] Entered By: [name] on 11/15/2017 14:56:50 Beaudoin, Conrado L. ([id number]) -------------------------------------------------------------------------------- Wound Assessment Details Patient Name: CALAME, Juanmanuel L. Date of Service: 11/15/2017 2:30 PM Medical Record Number: [id number] Patient Account Number:  [id number] Date of Birth/Sex: [birth year] (84 y.o. M) Treating RN: [name] Primary Care Virgina Deakins: [name] Other Clinician: Referring Tremell Reimers: [name] Treating Pratham Cassatt/Extender: [name], [name]: 12 Wound Status Wound Number: 3 Primary Etiology: Pressure Ulcer Wound Location: Right Toe Great Wound Status: Open Wounding Event: Pressure Injury Comorbid Arrhythmia, Hypertension, Received History: Radiation Date Acquired: 09/02/2017 Weeks Of [name]: 9 Clustered Wound: No Photos Photo Uploaded By: [name] on 11/18/2017 15:28:31 Wound Measurements Length: (cm) 0.1 Width: (cm) 0.1 Depth: (cm) 0.1 Area: (cm) 0.008 Volume: (cm) 0.001 % Reduction in Area: 95.9% % Reduction in Volume: 95% Epithelialization: Large (67-100%) Tunneling: No Undermining: No Wound Description Classification: Category/Stage II Wound Margin: Flat and Intact Exudate Amount: None Present Foul Odor After Cleansing: No Slough/Fibrino Yes Wound Bed Granulation Amount: None Present (0%) Exposed Structure Necrotic Amount: Large (67-100%) Fascia Exposed: No Necrotic Quality: Eschar Fat Layer (Subcutaneous Tissue) Exposed: No Tendon Exposed: No Muscle Exposed: No Joint Exposed: No Bone Exposed: No Periwound Skin Texture Texture Color No Abnormalities Noted: No No Abnormalities Noted: No Callus: No Atrophie Blanche: No Crepitus: No Cyanosis: No Excoriation: No Ecchymosis: No Mouser, Zethan L. ([id number]) Induration: No Erythema: No Rash: No Hemosiderin Staining: No Scarring:  No Mottled: No Pallor: No Moisture Rubor: No No Abnormalities Noted: No Dry / Scaly: No Temperature / Pain Maceration: No Temperature: No Abnormality Wound Preparation Ulcer Cleansing: Rinsed/Irrigated with Saline Topical Anesthetic Applied: None Electronic Signature(s) Signed: 11/15/2017 4:00:06 PM By: [name] Entered By: [name] on 11/15/2017  14:50:21 Auvil, Ching L. ([id number]) -------------------------------------------------------------------------------- Vitals Details Patient Name: BINETTE, Tamarcus L. Date of Service: 11/15/2017 2:30 PM Medical Record Number: [id number] Patient Account Number: [id number] Date of Birth/Sex: 1925/11/19 (84 y.o. M) Treating RN: [name] Primary Care Zarya Lasseigne: [name] Other Clinician: Referring Shravan Salahuddin: [name] Treating Lataunya Ruud/Extender: [name], [name]: 12 Vital Signs Time Taken: 14:45 Temperature (F): 97.5 Height (in): 70 Pulse (bpm): 70 Weight (lbs): 190 Respiratory Rate (breaths/min): 16 Body Mass Index (BMI): 27.3 Blood Pressure (mmHg): 114/58 Reference Range: 80 - 120 mg / dl Electronic Signature(s) Signed: 11/15/2017 4:00:06 PM By: [name] Entered By: [name] on 11/15/2017 14:49:04

## 2017-11-21 NOTE — Progress Notes (Signed)
[name], [name] ([id number]) Visit Report for 11/15/2017 Chief Complaint Document Details Patient Name: [name], REGALA. Date of Service: 11/15/2017 2:30 PM Medical Record Number: [id number] Patient Account Number: [id number] Date of Birth/Sex: 1926/01/08 (84 y.o. M) Treating RN: Montey Hora Primary Care Provider: Ramonita Lab Other Clinician: Referring Provider: Ramonita Lab Treating Provider/Extender: Melburn Hake, HOYT Weeks in Treatment: 12 Information Obtained from: Patient Chief Complaint Bilateral Heel pressure ulcers and bilateral great toe DTIs Electronic Signature(s) Signed: 11/16/2017 10:27:37 AM By: Worthy Keeler PA-C Entered By: Worthy Keeler on 11/15/2017 14:35:00 [name], [name] ([id number]) -------------------------------------------------------------------------------- Debridement Details Patient Name: TAVANO, Trayvion L. Date of Service: 11/15/2017 2:30 PM Medical Record Number: [id number] Patient Account Number: [id number] Date of Birth/Sex: 04/15/1926 (84 y.o. M) Treating RN: Montey Hora Primary Care Provider: Ramonita Lab Other Clinician: Referring Provider: Ramonita Lab Treating Provider/Extender: Melburn Hake, HOYT Weeks in Treatment: 12 Debridement Performed for Wound #2 Right Calcaneus Assessment: Performed By: Physician STONE III, HOYT E., PA-C Debridement Type: Debridement Pre-procedure Verification/Time Yes - 15:39 Out Taken: Start Time: 15:39 Pain Control: Lidocaine 4% Topical Solution Total Area Debrided (L x W): 1.6 (cm) x 2.2 (cm) = 3.52 (cm) Tissue and other material Viable, Non-Viable, Slough, Subcutaneous, Slough debrided: Level: Skin/Subcutaneous Tissue Debridement Description: Excisional Instrument: Curette Bleeding: Minimum Hemostasis Achieved: Pressure End Time: 15:42 Procedural Pain: 0 Post Procedural Pain: 0 Response to Treatment: Procedure was tolerated well Level of Consciousness: Awake and Alert Post Procedure  Vitals: Temperature: 97.5 Pulse: 70 Respiratory Rate: 16 Blood Pressure: Systolic Blood Pressure: 629 Diastolic Blood Pressure: 58 Post Debridement Measurements of Total Wound Length: (cm) 1.6 Stage: Unstageable/Unclassified Width: (cm) 2.2 Depth: (cm) 0.2 Volume: (cm) 0.553 Character of Wound/Ulcer Post Improved Debridement: Post Procedure Diagnosis Same as Pre-procedure Electronic Signature(s) Signed: 11/15/2017 4:42:41 PM By: Montey Hora Signed: 11/16/2017 10:27:37 AM By: Worthy Keeler PA-C Entered By: Montey Hora on 11/15/2017 15:42:46 [name], [name] ([id number]) [name], [name] ([id number]) -------------------------------------------------------------------------------- Debridement Details Patient Name: TURNBAUGH, Evelio L. Date of Service: 11/15/2017 2:30 PM Medical Record Number: [id number] Patient Account Number: [id number] Date of Birth/Sex: 1925-10-09 (84 y.o. M) Treating RN: Montey Hora Primary Care Provider: Ramonita Lab Other Clinician: Referring Provider: Ramonita Lab Treating Provider/Extender: Melburn Hake, HOYT Weeks in Treatment: 12 Debridement Performed for Wound #1 Left Calcaneus Assessment: Performed By: Physician STONE III, HOYT E., PA-C Debridement Type: Debridement Pre-procedure Verification/Time Yes - 15:42 Out Taken: Start Time: 15:42 Pain Control: Lidocaine 4% Topical Solution Total Area Debrided (L x W): 3.3 (cm) x 4 (cm) = 13.2 (cm) Tissue and other material Viable, Non-Viable, Slough, Subcutaneous, Slough debrided: Level: Skin/Subcutaneous Tissue Debridement Description: Excisional Instrument: Curette Bleeding: Minimum Hemostasis Achieved: Pressure End Time: 15:45 Procedural Pain: 0 Post Procedural Pain: 0 Response to Treatment: Procedure was tolerated well Level of Consciousness: Awake and Alert Post Procedure Vitals: Temperature: 97.5 Pulse: 70 Respiratory Rate: 16 Blood Pressure: Systolic Blood Pressure:  595 Diastolic Blood Pressure: 58 Post Debridement Measurements of Total Wound Length: (cm) 3.3 Stage: Unstageable/Unclassified Width: (cm) 4 Depth: (cm) 1 Volume: (cm) 10.367 Character of Wound/Ulcer Post Improved Debridement: Post Procedure Diagnosis Same as Pre-procedure Electronic Signature(s) Signed: 11/15/2017 4:42:41 PM By: Montey Hora Signed: 11/16/2017 10:27:37 AM By: Worthy Keeler PA-C Entered By: Montey Hora on 11/15/2017 15:44:46 [name], [name] ([id number]) [name], [name] ([id number]) -------------------------------------------------------------------------------- HPI Details Patient Name: [name], [name] Date of Service: 11/15/2017 2:30 PM Medical Record Number: [id number] Patient Account Number: [id number] Date of Birth/Sex: [birth year] (84 y.o. M) Treating RN: Marjory Lies,  Di Kindle Primary Care Provider: Ramonita Lab Other Clinician: Referring Provider: Ramonita Lab Treating Provider/Extender: Melburn Hake, HOYT Weeks in Treatment: 12 History of Present Illness Associated Signs and Symptoms: Patient has a history of chronic atrophic relation, hypertension, and long-term use of anticoagulants. He also had a fall with pelvic fracture which occurred on January 2019. HPI Description: 08/19/17 patient presents today for initial evaluation and our clinic concerning issues he has been having with the bilateral heels as well as the lateral great toe was since he was admitted to the nursing facility following a pelvic fracture. Initially he was spending a significant amount of time in the bed although he is now up more often since the healing process has progressed along the way obviously. When he is up he's spending most of the time sitting in a wheelchair at this point. When he is in bed he lays on his back and the covers/sheets are pushing down on his toes bilaterally which I think it may be what's causing the issues with his deep tissue injury to the bilateral great toes.  With that being said the hills also appear to be pressure in nature and I think that laying in the bed getting pressure to the sites is what has led to the formation of these on stage will pressure ulcers. Fortunately there does not appear to be any infection in both areas of ulceration of the heel appear to be stable at this point. Patient does not have any significant pain at the site she does have some of the eschar along the edges which is starting to lift up and come all hopefully slowly but surely this will occur. Patient did have arterial studies performed April 2018 which showed a normal TBI on the right knee slightly depressed TBI on the left but this does not appear to have changed significantly at that point. Specifically this was 0.75 on the right and 0.61 on the left. 09/13/17 on evaluation today patient presents for a follow-up evaluation regarding his bilateral heal ulcers. He has been tolerating the dressing changes fairly well although he did have some bleeding earlier today when it appears some of the skin on the surrounding portion of the wound got pulled where this is starting to flake off. Subsequently there does not appear to be any bleeding right now. Since I last saw him he has been discharged from the facility and is now back at home. His wife is having a very difficult time being able to get him up and in the car in order to come for an appointment which is why they missed the last appointment have not been able to come back sooner. Fortunately the he does not have any signs of infection. No fevers chills noted. He does have difficulty walking due to pain in the bilateral heels. 10/11/17 on evaluation today patient presents for follow-up evaluation concerning his ongoing bilateral heal ulcers. He has been tolerating the dressing changes which were Betadine paints daily without complication. I have not seen him since September 13, 2017. Pressure that I saw him August 19, 2017.  Subsequently those are the only prior visits before today. His left heel actually seems to have a significant amount of eschar that is starting to lift up at this point and seems to be a little more loose as far as drainage building up underneath the eschar. The right eschar though there are some parts lifting up seems to still be fairly stable which is good news. His toe ulcer  is almost completely healed a lot of that eschar came off and it looks very well underneath. 10/25/17 on evaluation today patient presents today regarding his bilateral heal ulcers in his right great toe ulcer. At this point his heels actually have loosened and softened to the point that I think we do need to remove these at this time. With that being said I think this will actually help them to heal much better and much more quickly currently. With that being said patient was a little bit worried about the debridement as far as pain was concerned fortunately this seemed to go very well today. 11/08/17 evaluation today patient's hills actually appear to be doing better compared to last 2 week's evaluation. The right heel especially is doing great and show signs of excellent granulation at this time. With that being said the left heel is much deeper and I was able to clean away a lot of the slough and subcutaneous tissue at this point he still has some noted that will take time to clear away. With that being said he has been tolerating the dressing changes will complication the doesn't of been some issues with getting the proper supplies I'm not exactly sure what the issue is there however we are to contact home health and try to figure this out. In the meantime fortunately he does not seem to have any significant pain on the right he does have some pain walking on the left which makes sense. 11/15/17 on evaluation today patient appears to be doing well at this point in time in regard to his bilateral heal ulcers. He does Peavler,  Floy L. ([id number]) still have slough noted over both especially the left although both seem to be making signs of improvement. Currently there is no evidence of infection he does have some discomfort on the left really nothing on the right. Electronic Signature(s) Signed: 11/16/2017 10:27:37 AM By: Worthy Keeler PA-C Entered By: Worthy Keeler on 11/16/2017 10:14:56 Cua, Nyheem L. ([id number]) -------------------------------------------------------------------------------- Physical Exam Details Patient Name: [name], [name] Date of Service: 11/15/2017 2:30 PM Medical Record Number: [id number] Patient Account Number: [id number] Date of Birth/Sex: 03/07/1926 (84 y.o. M) Treating RN: Montey Hora Primary Care Provider: Ramonita Lab Other Clinician: Referring Provider: Ramonita Lab Treating Provider/Extender: Melburn Hake, HOYT Weeks in Treatment: 17 Constitutional Well-nourished and well-hydrated in no acute distress. Respiratory normal breathing without difficulty. Psychiatric this patient is able to make decisions and demonstrates good insight into disease process. Alert and Oriented x 3. pleasant and cooperative. Notes Patient's wound did required debridement bilaterally to clear away as much [place] as possible. I was not able to completely clear away both but post debridement the wound beds did appear to be doing much better. Overall I'm very pleased with how things seem to be progressing. Fortunately he seems to be having less pain as well. Electronic Signature(s) Signed: 11/16/2017 10:27:37 AM By: Worthy Keeler PA-C Entered By: Worthy Keeler on 11/16/2017 10:15:37 [name], [name] ([id number]) -------------------------------------------------------------------------------- Physician Orders Details Patient Name: [name], [name] Date of Service: 11/15/2017 2:30 PM Medical Record Number: [id number] Patient Account Number: [id number] Date of Birth/Sex: 27-Mar-1926 (84 y.o.  M) Treating RN: Montey Hora Primary Care Provider: Ramonita Lab Other Clinician: Referring Provider: Ramonita Lab Treating Provider/Extender: Melburn Hake, HOYT Weeks in Treatment: 12 Verbal / Phone Orders: No Diagnosis Coding ICD-10 Coding Code Description L89.620 Pressure ulcer of left heel, unstageable L89.610 Pressure ulcer of right heel, unstageable I48.2 Chronic atrial fibrillation I10  Essential (primary) hypertension Z79.01 Long term (current) use of anticoagulants Wound Cleansing Wound #1 Left Calcaneus o Clean wound with Normal Saline. o Cleanse wound with mild soap and water o May Shower, gently pat wound dry prior to applying new dressing. Wound #2 Right Calcaneus o Clean wound with Normal Saline. o Cleanse wound with mild soap and water o May Shower, gently pat wound dry prior to applying new dressing. Wound #3 Right Toe Great o Clean wound with Normal Saline. o Cleanse wound with mild soap and water o May Shower, gently pat wound dry prior to applying new dressing. Primary Wound Dressing Wound #1 Left Calcaneus o Iodoflex Wound #2 Right Calcaneus o Iodoflex Wound #3 Right Toe Great o Other: - provodone-iodine Secondary Dressing Wound #1 Left Calcaneus o Dry Gauze o Foam Wound #2 Right Calcaneus o Dry Gauze o Foam [name], [name] ([id number]) Wound #3 Right Toe Great o Other - no bandages Dressing Change Frequency Wound #1 Left Calcaneus o Change dressing every day. o Change dressing twice daily. - morning and evening Wound #2 Right Calcaneus o Change dressing every day. Wound #3 Right Toe Great o Change dressing every day. Follow-up Appointments Wound #1 Left Calcaneus o Return Appointment in 2 weeks. Wound #2 Right Calcaneus o Return Appointment in 2 weeks. Wound #3 Right Toe Great o Return Appointment in 2 weeks. Off-Loading Wound #1 Left Calcaneus o Heel suspension boot to: - Please order  prevalon boots for bilateral feet Please float heel when pt is in the bed (NO PRESSURE ON HEELS) o Turn and reposition every 2 hours o Other: - Adjustable Blanket Lift Bar so the sheets and blanket does not put pressure on the toes Wound #2 Right Calcaneus o Heel suspension boot to: - Please order prevalon boots for bilateral feet Please float heel when pt is in the bed (NO PRESSURE ON HEELS) o Turn and reposition every 2 hours o Other: - Adjustable Blanket Lift Bar so the sheets and blanket does not put pressure on the toes Wound #3 Right Toe Great o Heel suspension boot to: - Please order prevalon boots for bilateral feet Please float heel when pt is in the bed (NO PRESSURE ON HEELS) o Turn and reposition every 2 hours o Other: - Adjustable Blanket Lift Bar so the sheets and blanket does not put pressure on the toes Additional Orders / Instructions Wound #1 Left Calcaneus o Increase protein intake. Wound #2 Right Calcaneus o Increase protein intake. Wound #3 Right Toe Great o Increase protein intake. [place] ([id number]) Wound #1 Left [place] Nurse may visit PRN to address patientos wound care needs. o FACE TO FACE ENCOUNTER: MEDICARE and MEDICAID PATIENTS: I certify that this patient is under my care and that I had a face-to-face encounter that meets the physician face-to-face encounter requirements with this patient on this date. The encounter with the patient was in whole or in part for the following MEDICAL CONDITION: (primary reason for [place]) MEDICAL NECESSITY: I certify, that based on my findings, NURSING services are a medically necessary home health service. HOME BOUND STATUS: I certify that my clinical findings support that this patient is homebound (i.e., Due to illness or injury, pt requires aid of supportive devices such as crutches, cane, wheelchairs, walkers, the  use of special transportation or the assistance of another person to leave their place of residence. There is a normal inability to leave the home and  doing so requires considerable and taxing effort. Other absences are for medical reasons / religious services and are infrequent or of short duration when for other reasons). o If current dressing causes regression in wound condition, may D/C ordered dressing product/s and apply Normal Saline Moist Dressing daily until next [place] / Other MD appointment. [place] of regression in wound condition at [phone]. o Please direct any NON-WOUND related issues/requests for orders to patient's Primary Care Physician Wound #2 Right [place] Nurse may visit PRN to address patientos wound care needs. o FACE TO FACE ENCOUNTER: MEDICARE and MEDICAID PATIENTS: I certify that this patient is under my care and that I had a face-to-face encounter that meets the physician face-to-face encounter requirements with this patient on this date. The encounter with the patient was in whole or in part for the following MEDICAL CONDITION: (primary reason for [place]) MEDICAL NECESSITY: I certify, that based on my findings, NURSING services are a medically necessary home health service. HOME BOUND STATUS: I certify that my clinical findings support that this patient is homebound (i.e., Due to illness or injury, pt requires aid of supportive devices such as crutches, cane, wheelchairs, walkers, the use of special transportation or the assistance of another person to leave their place of residence. There is a normal inability to leave the home and doing so requires considerable and taxing effort. Other absences are for medical reasons / religious services and are infrequent or of short duration when for other reasons). o If current dressing causes regression in wound condition,  may D/C ordered dressing product/s and apply Normal Saline Moist Dressing daily until next [place] / Other MD appointment. [place] of regression in wound condition at [phone]. o Please direct any NON-WOUND related issues/requests for orders to patient's Primary Care Physician Wound #3 Right Toe [place] Nurse may visit PRN to address patientos wound care needs. o FACE TO FACE ENCOUNTER: MEDICARE and MEDICAID PATIENTS: I certify that this patient is under my care and that I had a face-to-face encounter that meets the physician face-to-face encounter requirements with this patient on this date. The encounter with the patient was in whole or in part for the following MEDICAL CONDITION: (primary reason for [place]) MEDICAL NECESSITY: I certify, that based on my findings, NURSING services are a medically necessary home health service. HOME BOUND STATUS: I certify that my clinical findings support that this patient is homebound (i.e., Due to illness or injury, pt requires aid of supportive devices such as crutches, cane, wheelchairs, walkers, the use of special transportation or the assistance of another person to leave their place of residence. There is a normal inability to leave the home and doing so requires considerable and taxing effort. Other absences are for medical reasons / religious services and are infrequent or of short duration when for other reasons). o If current dressing causes regression in wound condition, may D/C ordered dressing product/s and apply Normal Saline Moist Dressing daily until next [place] / Other MD appointment. [place] of regression in wound condition at [phone]. o Please direct any NON-WOUND related issues/requests for orders to patient's Primary Care Physician Electronic Signature(s) Signed: 11/15/2017 4:42:41 PM By: Montey Hora Signed: 11/16/2017 10:27:37 AM By: Worthy Keeler PA-C [name], [name] ([id number]) Entered By: Montey Hora on 11/15/2017 15:45:15 [name], [name] ([id number]) --------------------------------------------------------------------------------  Problem List Details Patient Name: KAMRON, VANWYHE. Date of Service: 11/15/2017 2:30 PM Medical Record Number: [id number] Patient Account Number: [id number] Date of Birth/Sex: [birth year] (84 y.o. M) Treating RN: Montey Hora Primary Care Provider: Ramonita Lab Other Clinician: Referring Provider: Ramonita Lab Treating Provider/Extender: Melburn Hake, HOYT Weeks in Treatment: 12 Active Problems ICD-10 Impacting Encounter Code Description Active Date Wound Healing Diagnosis L89.620 Pressure ulcer of left heel, unstageable 08/19/2017 Yes L89.610 Pressure ulcer of right heel, unstageable 08/19/2017 Yes I48.2 Chronic atrial fibrillation 08/19/2017 Yes I10 Essential (primary) hypertension 08/19/2017 Yes Z79.01 Long term (current) use of anticoagulants 08/19/2017 Yes Inactive Problems Resolved Problems Electronic Signature(s) Signed: 11/16/2017 10:27:37 AM By: Worthy Keeler PA-C Entered By: Worthy Keeler on 11/15/2017 14:34:53 Edgley, Jaelan L. ([id number]) -------------------------------------------------------------------------------- Progress Note Details Patient Name: [name], Eidan L. Date of Service: 11/15/2017 2:30 PM Medical Record Number: [id number] Patient Account Number: [id number] Date of Birth/Sex: 1926/05/12 (84 y.o. M) Treating RN: Montey Hora Primary Care Provider: Ramonita Lab Other Clinician: Referring Provider: Ramonita Lab Treating Provider/Extender: Melburn Hake, HOYT Weeks in Treatment: 12 Subjective Chief Complaint Information obtained from Patient Bilateral Heel pressure ulcers and bilateral great toe DTIs History of Present Illness (HPI) The following HPI elements were documented for the patient's wound: Associated  Signs and Symptoms: Patient has a history of chronic atrophic relation, hypertension, and long-term use of anticoagulants. He also had a fall with pelvic fracture which occurred on January 2019. 08/19/17 patient presents today for initial evaluation and our clinic concerning issues he has been having with the bilateral heels as well as the lateral great toe was since he was admitted to the nursing facility following a pelvic fracture. Initially he was spending a significant amount of time in the bed although he is now up more often since the healing process has progressed along the way obviously. When he is up he's spending most of the time sitting in a wheelchair at this point. When he is in bed he lays on his back and the covers/sheets are pushing down on his toes bilaterally which I think it may be what's causing the issues with his deep tissue injury to the bilateral great toes. With that being said the hills also appear to be pressure in nature and I think that laying in the bed getting pressure to the sites is what has led to the formation of these on stage will pressure ulcers. Fortunately there does not appear to be any infection in both areas of ulceration of the heel appear to be stable at this point. Patient does not have any significant pain at the site she does have some of the eschar along the edges which is starting to lift up and come all hopefully slowly but surely this will occur. Patient did have arterial studies performed April 2018 which showed a normal TBI on the right knee slightly depressed TBI on the left but this does not appear to have changed significantly at that point. Specifically this was 0.75 on the right and 0.61 on the left. 09/13/17 on evaluation today patient presents for a follow-up evaluation regarding his bilateral heal ulcers. He has been tolerating the dressing changes fairly well although he did have some bleeding earlier today when it appears some of the  skin on the surrounding portion of the wound got pulled where this is starting to flake off. Subsequently there does not appear to be any bleeding right now. Since I last saw him he has been discharged from the facility  and is now back at home. His wife is having a very difficult time being able to get him up and in the car in order to come for an appointment which is why they missed the last appointment have not been able to come back sooner. Fortunately the he does not have any signs of infection. No fevers chills noted. He does have difficulty walking due to pain in the bilateral heels. 10/11/17 on evaluation today patient presents for follow-up evaluation concerning his ongoing bilateral heal ulcers. He has been tolerating the dressing changes which were Betadine paints daily without complication. I have not seen him since September 13, 2017. Pressure that I saw him August 19, 2017. Subsequently those are the only prior visits before today. His left heel actually seems to have a significant amount of eschar that is starting to lift up at this point and seems to be a little more loose as far as drainage building up underneath the eschar. The right eschar though there are some parts lifting up seems to still be fairly stable which is good news. His toe ulcer is almost completely healed a lot of that eschar came off and it looks very well underneath. 10/25/17 on evaluation today patient presents today regarding his bilateral heal ulcers in his right great toe ulcer. At this point his heels actually have loosened and softened to the point that I think we do need to remove these at this time. With that being said I think this will actually help them to heal much better and much more quickly currently. With that being said patient was a little bit worried about the debridement as far as pain was concerned fortunately this seemed to go very well today. 11/08/17 evaluation today patient's hills actually appear  to be doing better compared to last 2 week's evaluation. The right heel [place]stein, [place] ([id number]) especially is doing great and show signs of excellent granulation at this time. With that being said the left heel is much deeper and I was able to clean away a lot of the slough and subcutaneous tissue at this point he still has some noted that will take time to clear away. With that being said he has been tolerating the dressing changes will complication the doesn't of been some issues with getting the proper supplies I'm not exactly sure what the issue is there however we are to contact home health and try to figure this out. In the meantime fortunately he does not seem to have any significant pain on the right he does have some pain walking on the left which makes sense. 11/15/17 on evaluation today patient appears to be doing well at this point in time in regard to his bilateral heal ulcers. He does still have slough noted over both especially the left although both seem to be making signs of improvement. Currently there is no evidence of infection he does have some discomfort on the left really nothing on the right. Patient History Information obtained from Patient. Family History No family history of Cancer, Diabetes, Heart Disease, Hereditary Spherocytosis, Hypertension, Kidney Disease, Lung Disease, Seizures, Stroke, Thyroid Problems, Tuberculosis. Social History Former smoker, Marital Status - Married, Alcohol Use - Rarely - 2oz day, Drug Use - No History, Caffeine Use - Daily. Review of Systems (ROS) Constitutional Symptoms (General Health) Denies complaints or symptoms of Fever, Chills. Respiratory The patient has no complaints or symptoms. Cardiovascular The patient has no complaints or symptoms. Psychiatric The patient has no complaints or  symptoms. Objective Constitutional Well-nourished and well-hydrated in no acute distress. Vitals Time Taken: 2:45 PM, Height: 70 in,  Weight: 190 lbs, BMI: 27.3, Temperature: 97.5 F, Pulse: 70 bpm, Respiratory Rate: 16 breaths/min, Blood Pressure: 114/58 mmHg. Respiratory normal breathing without difficulty. Psychiatric this patient is able to make decisions and demonstrates good insight into disease process. Alert and Oriented x 3. pleasant and cooperative. General Notes: Patient's wound did required debridement bilaterally to clear away as much [facility] as possible. I was not able to completely clear away both but post debridement the wound beds did appear to be doing much better. Overall I'm very [place], [place] ([id number]) pleased with how things seem to be progressing. Fortunately he seems to be having less pain as well. Integumentary (Hair, Skin) Wound #1 status is Open. Original cause of wound was Pressure Injury. The wound is located on the Left Calcaneus. The wound measures 3.3cm length x 4cm width x 0.9cm depth; 10.367cm^2 area and 9.331cm^3 volume. There is Fat Layer (Subcutaneous Tissue) Exposed exposed. There is no tunneling or undermining noted. There is a large amount of purulent drainage noted. The wound margin is distinct with the outline attached to the wound base. There is small (1-33%) red granulation within the wound bed. There is a large (67-100%) amount of necrotic tissue within the wound bed including Adherent Slough. The periwound skin appearance exhibited: Callus, Dry/Scaly, Maceration. Periwound temperature was noted as No Abnormality. The periwound has tenderness on palpation. Wound #2 status is Open. Original cause of wound was Pressure Injury. The wound is located on the Right Calcaneus. The wound measures 1.6cm length x 2.2cm width x 0.1cm depth; 2.765cm^2 area and 0.276cm^3 volume. There is Fat Layer (Subcutaneous Tissue) Exposed exposed. There is no tunneling or undermining noted. There is a large amount of serosanguineous drainage noted. The wound margin is thickened. There is large  (67-100%) red granulation within the wound bed. There is a small (1-33%) amount of necrotic tissue within the wound bed including Adherent Slough. The periwound skin appearance did not exhibit: Dry/Scaly. Periwound temperature was noted as No Abnormality. The periwound has tenderness on palpation. Wound #3 status is Open. Original cause of wound was Pressure Injury. The wound is located on the Right Toe Great. The wound measures 0.1cm length x 0.1cm width x 0.1cm depth; 0.008cm^2 area and 0.001cm^3 volume. There is no tunneling or undermining noted. There is a none present amount of drainage noted. The wound margin is flat and intact. There is no granulation within the wound bed. There is a large (67-100%) amount of necrotic tissue within the wound bed including Eschar. The periwound skin appearance did not exhibit: Callus, Crepitus, Excoriation, Induration, Rash, Scarring, Dry/Scaly, Maceration, Atrophie Blanche, Cyanosis, Ecchymosis, Hemosiderin Staining, Mottled, Pallor, Rubor, Erythema. Periwound temperature was noted as No Abnormality. 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 There was a Excisional Skin/Subcutaneous Tissue Debridement with a total area of 13.2 sq cm performed by STONE III, HOYT E., PA-C. With the following instrument(s): Curette to remove Viable and Non-Viable tissue/material. Material removed includes Subcutaneous Tissue and Slough and after achieving pain control using Lidocaine 4% Topical Solution. No specimens were taken. A time out was conducted at 15:42, prior to the  start of the procedure. A Minimum amount of bleeding was controlled with  Pressure. The procedure was tolerated well with a pain level of 0 throughout and a pain level of 0 following the procedure. Patient s Level of Consciousness post procedure was recorded as Awake and Alert and post-procedure vitals were taken including Temperature: 97.5 F, Pulse: 70 bpm, Respiratory Rate: 16 breaths/min, Blood Pressure: (114)/(58) mmHg. Post Debridement Measurements: 3.3cm length x 4cm width x 1cm depth; 10.367cm^3 volume. Post debridement Stage noted as Hlavac, Adelfo L. ([id number]) Unstageable/Unclassified. Character of Wound/Ulcer Post Debridement is improved. Post procedure Diagnosis Wound #1: Same as Pre-Procedure Wound #2 Pre-procedure diagnosis of Wound #2 is a Pressure Ulcer located on the Right Calcaneus . There was a Excisional Skin/Subcutaneous Tissue Debridement with a total area of 3.52 sq cm performed by STONE III, HOYT E., PA-C. With the following instrument(s): Curette to remove Viable and Non-Viable tissue/material. Material removed includes Subcutaneous Tissue and Slough and after achieving pain control using Lidocaine 4% Topical Solution. No specimens were taken. A time out was conducted at 15:39, prior to the start of the procedure. A Minimum amount of bleeding was controlled with Pressure. The procedure was tolerated well with a pain level of 0 throughout and a pain level of 0 following the procedure. Patient s Level of Consciousness post procedure was recorded as Awake and Alert and post-procedure vitals were taken including Temperature: 97.5 F, Pulse: 70 bpm, Respiratory Rate: 16 breaths/min, Blood Pressure: (114)/(58) mmHg. Post Debridement Measurements: 1.6cm length x 2.2cm width x 0.2cm depth; 0.553cm^3 volume. Post debridement Stage noted as Unstageable/Unclassified. Character of Wound/Ulcer Post Debridement is improved. Post procedure Diagnosis Wound #2: Same as Pre-Procedure Plan Wound Cleansing: Wound #1 Left Calcaneus: Clean wound with Normal  Saline. Cleanse wound with mild soap and water May Shower, gently pat wound dry prior to applying new dressing. Wound #2 Right Calcaneus: Clean wound with Normal Saline. Cleanse wound with mild soap and water May Shower, gently pat wound dry prior to applying new dressing. Wound #3 Right Toe Great: Clean wound with Normal Saline. Cleanse wound with mild soap and water May Shower, gently pat wound dry prior to applying new dressing. Primary Wound Dressing: Wound #1 Left Calcaneus: Iodoflex Wound #2 Right Calcaneus: Iodoflex Wound #3 Right Toe Great: Other: - provodone-iodine Secondary Dressing: Wound #1 Left Calcaneus: Dry Gauze Foam Wound #2 Right Calcaneus: Dry Gauze Foam Wound #3 Right Toe Great: Other - no bandages Dressing Change Frequency: Wound #1 Left Calcaneus: Change dressing every day. Change dressing twice daily. - morning and evening Kehm, Malikah L. ([id number]) Wound #2 Right Calcaneus: Change dressing every day. Wound #3 Right Toe Great: Change dressing every day. Follow-up Appointments: Wound #1 Left Calcaneus: Return Appointment in 2 weeks. Wound #2 Right Calcaneus: Return Appointment in 2 weeks. Wound #3 Right Toe Great: Return Appointment in 2 weeks. Off-Loading: Wound #1 Left Calcaneus: Heel suspension boot to: - Please order prevalon boots for bilateral feet Please float heel when pt is in the bed (NO PRESSURE ON HEELS) Turn and reposition every 2 hours Other: - Adjustable Blanket Lift Bar so the sheets and blanket does not put pressure on the toes Wound #2 Right Calcaneus: Heel suspension boot to: - Please order prevalon boots for bilateral feet Please float heel when pt is in the bed (NO PRESSURE ON HEELS) Turn and reposition every 2 hours Other: - Adjustable Blanket Lift Bar so the sheets and blanket does not put pressure on the toes Wound #3 Right  Toe Great: Heel suspension boot to: - Please order prevalon boots for bilateral feet Please  float heel when pt is in the bed (NO PRESSURE ON HEELS) Turn and reposition every 2 hours Other: - Adjustable Blanket Lift Bar so the sheets and blanket does not put pressure on the toes Additional Orders / Instructions: Wound #1 Left Calcaneus: Increase protein intake. Wound #2 Right Calcaneus: Increase protein intake. Wound #3 Right Toe Great: Increase protein intake. Home Health: Wound #1 Left Calcaneus: [place] Nurse may visit PRN to address patient s wound care needs. FACE TO FACE ENCOUNTER: MEDICARE and MEDICAID PATIENTS: I certify that this patient is under my care and that I had a face-to-face encounter that meets the physician face-to-face encounter requirements with this patient on this date. The encounter with the patient was in whole or in part for the following MEDICAL CONDITION: (primary reason for [place]) MEDICAL NECESSITY: I certify, that based on my findings, NURSING services are a medically necessary home health service. HOME BOUND STATUS: I certify that my clinical findings support that this patient is homebound (i.e., Due to illness or injury, pt requires aid of supportive devices such as crutches, cane, wheelchairs, walkers, the use of special transportation or the assistance of another person to leave their place of residence. There is a normal inability to leave the home and doing so requires considerable and taxing effort. Other absences are for medical reasons / religious services and are infrequent or of short duration when for other reasons). If current dressing causes regression in wound condition, may D/C ordered dressing product/s and apply Normal Saline Moist Dressing daily until next [place] / Other MD appointment. [place] of regression in wound condition at [phone]. Please direct any NON-WOUND related issues/requests for orders to patient's Primary Care Physician Wound #2 Right  Calcaneus: [place] Nurse may visit PRN to address patient s wound care needs. FACE TO FACE ENCOUNTER: MEDICARE and MEDICAID PATIENTS: I certify that this patient is under my care and that I had a face-to-face encounter that meets the physician face-to-face encounter requirements with this patient on this date. The encounter with the patient was in whole or in part for the following MEDICAL CONDITION: (primary reason for [place]) MEDICAL NECESSITY: I certify, that based on my findings, NURSING services are a medically necessary home health service. HOME BOUND STATUS: I certify that my clinical findings support that this patient is homebound (i.e., Due to illness or injury, pt requires aid of supportive devices such as crutches, cane, wheelchairs, walkers, the use of special Suire, Avrian L. ([id number]) transportation or the assistance of another person to leave their place of residence. There is a normal inability to leave the home and doing so requires considerable and taxing effort. Other absences are for medical reasons / religious services and are infrequent or of short duration when for other reasons). If current dressing causes regression in wound condition, may D/C ordered dressing product/s and apply Normal Saline Moist Dressing daily until next [place] / Other MD appointment. [place] of regression in wound condition at [phone]. Please direct any NON-WOUND related issues/requests for orders to patient's Primary Care Physician Wound #3 Right Toe Great: [place] Nurse may visit PRN to address patient s wound care needs. FACE TO FACE ENCOUNTER: MEDICARE and MEDICAID PATIENTS: I certify that this patient is under my care and that  I had a face-to-face encounter that meets the physician face-to-face encounter requirements with this patient on this date. The encounter with the patient  was in whole or in part for the following MEDICAL CONDITION: (primary reason for [place]) MEDICAL NECESSITY: I certify, that based on my findings, NURSING services are a medically necessary home health service. HOME BOUND STATUS: I certify that my clinical findings support that this patient is homebound (i.e., Due to illness or injury, pt requires aid of supportive devices such as crutches, cane, wheelchairs, walkers, the use of special transportation or the assistance of another person to leave their place of residence. There is a normal inability to leave the home and doing so requires considerable and taxing effort. Other absences are for medical reasons / religious services and are infrequent or of short duration when for other reasons). If current dressing causes regression in wound condition, may D/C ordered dressing product/s and apply Normal Saline Moist Dressing daily until next [place] / Other MD appointment. [place] of regression in wound condition at [phone]. Please direct any NON-WOUND related issues/requests for orders to patient's Primary Care Physician I'm gonna suggest currently that we continue with the Current wound care measures for the next week. Patient is in agreement with this plan. However it sounds as if there been some issues with getting the Iodoflex and I'm not sure how much this is gonna cost the patient through home health. We're gonna contact them and try to figure this out. Depending on which is cheaper we may switch to Santyl it all really just depends on what we find out. Please see above for specific wound care orders. We will see patient for re-evaluation in 1 week(s) here in the clinic. If anything worsens or changes patient will contact our office for additional recommendations. Electronic Signature(s) Signed: 11/16/2017 10:27:37 AM By: Worthy Keeler PA-C Entered By: Worthy Keeler on 11/16/2017  10:16:16 Mccoin, Elmore L. ([id number]) -------------------------------------------------------------------------------- ROS/PFSH Details Patient Name: LEVICK, Shaheer L. Date of Service: 11/15/2017 2:30 PM Medical Record Number: [id number] Patient Account Number: [id number] Date of Birth/Sex: 1925/07/23 (84 y.o. M) Treating RN: Montey Hora Primary Care Provider: [facility] Other Clinician: Referring Provider: [facility] Treating Provider/Extender: Melburn Hake, HOYT Weeks in Treatment: 12 Information Obtained From Patient Wound History Do you currently have one or more open woundso Yes How many open wounds do you currently haveo 2 Approximately how long have you had your woundso 1 week How have you been treating your wound(s) until nowo nothing Has your wound(s) ever healed and then re-openedo No Have you had any lab work done in the past montho No Have you tested positive for osteomyelitis (bone infection)o No Have you had any tests for circulation on your legso No Have you had other problems associated with your woundso Swelling Constitutional Symptoms (General Health) Complaints and Symptoms: Negative for: Fever; Chills Respiratory Complaints and Symptoms: No Complaints or Symptoms Cardiovascular Complaints and Symptoms: No Complaints or Symptoms Medical History: Positive for: Arrhythmia - a-fib; Hypertension Oncologic Medical History: Positive for: Received Radiation Psychiatric Complaints and Symptoms: No Complaints or Symptoms Immunizations Pneumococcal Vaccine: Received Pneumococcal Vaccination: Yes Implantable Devices Family and Social History Bord, Dionel L. ([id number]) Cancer: No; Diabetes: No; Heart Disease: No; Hereditary Spherocytosis: No; Hypertension: No; Kidney Disease: No; Lung Disease: No; Seizures: No; Stroke: No; Thyroid Problems: No; Tuberculosis: No; Former smoker; Marital Status - Married; Alcohol Use: Rarely - 2oz day; Drug Use: No History;  Caffeine Use:  Daily; Financial Concerns: No; Food, Clothing or Shelter Needs: No; Support System Lacking: No; Transportation Concerns: No; Advanced Directives: Yes (Not Provided); Patient does not want information on Advanced Directives; Do not resuscitate: No; Living Will: Yes (Not Provided); Medical Power of Attorney: No Physician Affirmation I have reviewed and agree with the above information. Electronic Signature(s) Signed: 11/16/2017 10:27:37 AM By: Worthy Keeler PA-C Signed: 11/20/2017 4:31:04 PM By: Montey Hora Entered By: Worthy Keeler on 11/16/2017 10:15:17 Booz, Tobias L. ([id number]) -------------------------------------------------------------------------------- SuperBill Details Patient Name: ADINOLFI, Kelly L. Date of Service: 11/15/2017 Medical Record Number: [id number] Patient Account Number: [id number] Date of Birth/Sex: Jan 27, 1926 (84 y.o. M) Treating RN: Montey Hora Primary Care Provider: [facility] Other Clinician: Referring Provider: [facility] Treating Provider/Extender: Melburn Hake, HOYT Weeks in Treatment: 12 Diagnosis Coding ICD-10 Codes Code Description L89.620 Pressure ulcer of left heel, unstageable L89.610 Pressure ulcer of right heel, unstageable I48.2 Chronic atrial fibrillation I10 Essential (primary) hypertension Z79.01 Long term (current) use of anticoagulants Facility Procedures CPT4 Code: [id number] Description: 35456 - DEB SUBQ TISSUE 20 SQ CM/< ICD-10 Diagnosis Description L89.620 Pressure ulcer of left heel, unstageable L89.610 Pressure ulcer of right heel, unstageable Modifier: Quantity: 1 Physician Procedures CPT4 Code: [id number] Description: 73428 - WC PHYS SUBQ TISS 20 SQ CM ICD-10 Diagnosis Description L89.620 Pressure ulcer of left heel, unstageable L89.610 Pressure ulcer of right heel, unstageable Modifier: Quantity: 1 Electronic Signature(s) Signed: 11/16/2017 10:27:37 AM By: Worthy Keeler PA-C Entered By: Worthy Keeler on  11/16/2017 10:16:38

## 2017-11-22 ENCOUNTER — Encounter: Payer: PPO | Admitting: Physician Assistant

## 2017-11-22 DIAGNOSIS — L89620 Pressure ulcer of left heel, unstageable: Secondary | ICD-10-CM | POA: Diagnosis not present

## 2017-11-22 DIAGNOSIS — L89610 Pressure ulcer of right heel, unstageable: Secondary | ICD-10-CM | POA: Diagnosis not present

## 2017-11-24 DIAGNOSIS — L89620 Pressure ulcer of left heel, unstageable: Secondary | ICD-10-CM | POA: Diagnosis not present

## 2017-11-24 DIAGNOSIS — L89610 Pressure ulcer of right heel, unstageable: Secondary | ICD-10-CM | POA: Diagnosis not present

## 2017-11-24 NOTE — Progress Notes (Signed)
Folmar, Fannie L. ([id number]) Visit Report for 11/22/2017 Arrival Information Details Patient Name: KALVYN, DESA. Date of Service: 11/22/2017 2:00 PM Medical Record Number: [id number] Patient Account Number: [id number] Date of Birth/Sex: January 06, 1926 (84 y.o. M) Treating RN: Cornell Barman Primary Care Mikah Rottinghaus: Ramonita Lab Other Clinician: Referring Leeroy Lovings: Ramonita Lab Treating Rubby Barbary/Extender: Melburn Hake, HOYT Weeks in Treatment: 13 Visit Information History Since Last Visit Added or deleted any medications: No Patient Arrived: Walker Any new allergies or adverse reactions: No Arrival Time: 14:23 Had a fall or experienced change in No Accompanied By: wife activities of daily living that may affect Transfer Assistance: None risk of falls: Patient Identification Verified: Yes Signs or symptoms of abuse/neglect since last visito No Secondary Verification Process Yes Has Dressing in Place as Prescribed: Yes Completed: Pain Present Now: No Patient Requires Transmission-Based No Precautions: Patient Has Alerts: Yes Patient Alerts: Patient on Blood Thinner Warfarin Electronic Signature(s) Signed: 11/22/2017 5:09:47 PM By: Gretta Cool, BSN, RN, CWS, Kim RN, BSN Entered By: Gretta Cool, BSN, RN, CWS, Kim on 11/22/2017 14:23:57 Angert, Esdras L. ([id number]) -------------------------------------------------------------------------------- Encounter Discharge Information Details Patient Name: ANTWOINE, ZORN. Date of Service: 11/22/2017 2:00 PM Medical Record Number: [id number] Patient Account Number: [id number] Date of Birth/Sex: October 13, 1925 (84 y.o. M) Treating RN: Ahmed Prima Primary Care Aryiah Monterosso: Ramonita Lab Other Clinician: Referring Husna Krone: Ramonita Lab Treating Orine Goga/Extender: Melburn Hake, HOYT Weeks in Treatment: 13 Encounter Discharge Information Items Discharge Condition: Stable Ambulatory Status: Walker Discharge Destination: Home Transportation: Private Auto Accompanied By:  wife Schedule Follow-up Appointment: No Clinical Summary of Care: Electronic Signature(s) Signed: 11/22/2017 3:38:13 PM By: Alric Quan Entered By: Alric Quan on 11/22/2017 15:38:13 Korpi, Jemarion L. ([id number]) -------------------------------------------------------------------------------- Lower Extremity Assessment Details Patient Name: Thom, Shaya L. Date of Service: 11/22/2017 2:00 PM Medical Record Number: [id number] Patient Account Number: [id number] Date of Birth/Sex: 11-12-1925 (84 y.o. M) Treating RN: Cornell Barman Primary Care Braeley Buskey: Ramonita Lab Other Clinician: Referring Alannah Averhart: Ramonita Lab Treating Gale Hulse/Extender: Melburn Hake, HOYT Weeks in Treatment: 13 Vascular Assessment Pulses: Dorsalis Pedis Palpable: [Left:Yes] [Right:Yes] Posterior Tibial Extremity colors, hair growth, and conditions: Extremity Color: [Left:Normal] [Right:Normal] Hair Growth on Extremity: [Left:No] [Right:No] Temperature of Extremity: [Left:Warm] [Right:Warm] Capillary Refill: [Left:< 3 seconds] [Right:< 3 seconds] Toe Nail Assessment Left: Right: Thick: Yes Yes Discolored: Yes Yes Deformed: Yes Yes Improper Length and Hygiene: Yes Yes Electronic Signature(s) Signed: 11/22/2017 5:09:47 PM By: Gretta Cool, BSN, RN, CWS, Kim RN, BSN Entered By: Gretta Cool, BSN, RN, CWS, Kim on 11/22/2017 14:36:20 Forry, Deigo L. ([id number]) -------------------------------------------------------------------------------- Multi Wound Chart Details Patient Name: WENTZELL, Oslo L. Date of Service: 11/22/2017 2:00 PM Medical Record Number: [id number] Patient Account Number: [id number] Date of Birth/Sex: February 28, 1926 (84 y.o. M) Treating RN: Montey Hora Primary Care Raheem Kolbe: Ramonita Lab Other Clinician: Referring Hasten Sweitzer: Ramonita Lab Treating Rudi Knippenberg/Extender: Melburn Hake, HOYT Weeks in Treatment: 13 Vital Signs Height(in): 70 Pulse(bpm): 68 Weight(lbs): 190 Blood Pressure(mmHg): 118/55 Body Mass  Index(BMI): 27 Temperature(F): 97.7 Respiratory Rate 16 (breaths/min): Photos: [1:No Photos] [2:No Photos] [3:No Photos] Wound Location: [1:Left Calcaneus] [2:Right Calcaneus] [3:Right Toe Great] Wounding Event: [1:Pressure Injury] [2:Pressure Injury] [3:Pressure Injury] Primary Etiology: [1:Pressure Ulcer] [2:Pressure Ulcer] [3:Pressure Ulcer] Date Acquired: [1:08/12/2017] [2:08/12/2017] [3:09/02/2017] Weeks of Treatment: [1:13] [2:13] [3:10] Wound Status: [1:Open] [2:Open] [3:Open] Measurements L x W x D [1:3x3.8x0.5] [2:1.5x2x0.2] [3:0.1x0.1x0.1] (cm) Area (cm) : [1:8.954] [2:2.356] [3:0.008] Volume (cm) : [1:4.477] [2:0.471] [3:0.001] % Reduction in Area: [1:60.90%] [2:79.50%] [3:95.90%] % Reduction in Volume: [1:2.20%] [2:79.50%] [3:95.00%] Classification: [1:Unstageable/Unclassified] [2:Unstageable/Unclassified] [3:Category/Stage II] Periwound Skin Texture: [1:No Abnormalities Noted] [  2:No Abnormalities Noted] [3:No Abnormalities Noted] Periwound Skin Moisture: [1:No Abnormalities Noted] [2:No Abnormalities Noted] [3:No Abnormalities Noted] Periwound Skin Color: [1:No Abnormalities Noted No] [2:No Abnormalities Noted No] [3:No Abnormalities Noted No] Treatment Notes Electronic Signature(s) Signed: 11/22/2017 4:56:04 PM By: Montey Hora Entered By: Montey Hora on 11/22/2017 15:09:25 Stemler, Jaxsen L. ([id number]) -------------------------------------------------------------------------------- Andrews Details Patient Name: CHILD, CAMPOY. Date of Service: 11/22/2017 2:00 PM Medical Record Number: [id number] Patient Account Number: [id number] Date of Birth/Sex: 12/30/1925 (84 y.o. M) Treating RN: Montey Hora Primary Care Ved Martos: Ramonita Lab Other Clinician: Referring Dasan Hardman: Ramonita Lab Treating Addalee Kavanagh/Extender: Melburn Hake, HOYT Weeks in Treatment: 13 Active Inactive ` Abuse / Safety / Falls / Self Care Management Nursing Diagnoses: History  of Falls Potential for falls Goals: Patient will not experience any injury related to falls Date Initiated: 08/19/2017 Target Resolution Date: 12/14/2017 Goal Status: Active Interventions: Assess Activities of Daily Living upon admission and as needed Assess fall risk on admission and as needed Assess: immobility, friction, shearing, incontinence upon admission and as needed Assess impairment of mobility on admission and as needed per policy Assess personal safety and home safety (as indicated) on admission and as needed Assess self care needs on admission and as needed Notes: ` Nutrition Nursing Diagnoses: Imbalanced nutrition Potential for alteratiion in Nutrition/Potential for imbalanced nutrition Goals: Patient/caregiver agrees to and verbalizes understanding of need to use nutritional supplements and/or vitamins as prescribed Date Initiated: 08/19/2017 Target Resolution Date: 11/16/2017 Goal Status: Active Interventions: Assess patient nutrition upon admission and as needed per policy Notes: ` Orientation to the Wound Care Program Nursing Diagnoses: Mcclintic, Kalvyn L. ([id number]) Knowledge deficit related to the wound healing center program Goals: Patient/caregiver will verbalize understanding of the [place] Date Initiated: 08/19/2017 Target Resolution Date: 09/14/2017 Goal Status: Active Interventions: Provide education on orientation to the wound center Notes: ` Pain, Acute or Chronic Nursing Diagnoses: Pain, acute or chronic: actual or potential Potential alteration in comfort, pain Goals: Patient/caregiver will verbalize adequate pain control between visits Date Initiated: 08/19/2017 Target Resolution Date: 12/14/2017 Goal Status: Active Interventions: Complete pain assessment as per visit requirements Notes: ` Pressure Nursing Diagnoses: Knowledge deficit related to causes and risk factors for pressure ulcer development Knowledge deficit  related to management of pressures ulcers Potential for impaired tissue integrity related to pressure, friction, moisture, and shear Goals: Patient will remain free from development of additional pressure ulcers Date Initiated: 08/19/2017 Target Resolution Date: 12/14/2017 Goal Status: Active Interventions: Assess: immobility, friction, shearing, incontinence upon admission and as needed Assess potential for pressure ulcer upon admission and as needed Notes: ` Wound/Skin Impairment Nursing Diagnoses: Impaired tissue integrity Knowledge deficit related to ulceration/compromised skin integrity Socarras, Javontay L. ([id number]) Goals: Ulcer/skin breakdown will have a volume reduction of 80% by week 12 Date Initiated: 08/19/2017 Target Resolution Date: 12/14/2017 Goal Status: Active Interventions: Assess patient/caregiver ability to perform ulcer/skin care regimen upon admission and as needed Assess ulceration(s) every visit Notes: Electronic Signature(s) Signed: 11/22/2017 4:56:04 PM By: Montey Hora Entered By: Montey Hora on 11/22/2017 15:09:17 Borenstein, Kawhi L. ([id number]) -------------------------------------------------------------------------------- Pain Assessment Details Patient Name: BATCH, Nikolos L. Date of Service: 11/22/2017 2:00 PM Medical Record Number: [id number] Patient Account Number: [id number] Date of Birth/Sex: [birth year] (84 y.o. M) Treating RN: Cornell Barman Primary Care [place]: Ramonita Lab Other Clinician: Referring Lorcan Shelp: Ramonita Lab Treating Casaundra Takacs/Extender: Melburn Hake, HOYT Weeks in Treatment: 13 Active Problems Location of Pain Severity and Description of Pain Patient Has Paino No Site Locations With Dressing  Change: No Pain Management and Medication Current Pain Management: Electronic Signature(s) Signed: 11/22/2017 5:09:47 PM By: Gretta Cool, BSN, RN, CWS, Kim RN, BSN Entered By: Gretta Cool, BSN, RN, CWS, Kim on 11/22/2017 14:24:19 Knoch, Karlon L.  ([id number]) -------------------------------------------------------------------------------- Patient/Caregiver Education Details Patient Name: KWABENA, STRUTZ L. Date of Service: 11/22/2017 2:00 PM Medical Record Number: [id number] Patient Account Number: [id number] Date of Birth/Gender: 10-09-1925 (84 y.o. M) Treating RN: Ahmed Prima Primary Care Physician: Ramonita Lab Other Clinician: Referring Physician: Ramonita Lab Treating Physician/Extender: Sharalyn Ink in Treatment: 13 Education Assessment Education Provided To: Patient Education Topics Provided Wound/Skin Impairment: Handouts: Caring for Your Ulcer, Other: change dressing as ordered Methods: Demonstration, Explain/Verbal Responses: State content correctly Electronic Signature(s) Signed: 11/22/2017 4:38:26 PM By: Alric Quan Entered By: Alric Quan on 11/22/2017 15:38:46 Vizzini, Inocencio L. ([id number]) -------------------------------------------------------------------------------- Wound Assessment Details Patient Name: SIPOS, Jigar L. Date of Service: 11/22/2017 2:00 PM Medical Record Number: [id number] Patient Account Number: [id number] Date of Birth/Sex: [birth year] (84 y.o. M) Treating RN: Cornell Barman Primary Care Edoardo Laforte: Ramonita Lab Other Clinician: Referring Delina Kruczek: Ramonita Lab Treating Tekisha Darcey/Extender: Melburn Hake, HOYT Weeks in Treatment: 13 Wound Status Wound Number: 1 Primary Etiology: Pressure Ulcer Wound Location: Left Calcaneus Wound Status: Open Wounding Event: Pressure Injury Date Acquired: 08/12/2017 Weeks Of Treatment: 13 Clustered Wound: No Wound Measurements Length: (cm) 3 Width: (cm) 3.8 Depth: (cm) 0.5 Area: (cm) 8.954 Volume: (cm) 4.477 % Reduction in Area: 60.9% % Reduction in Volume: 2.2% Wound Description Classification: Unstageable/Unclassified Periwound Skin Texture Texture Color No Abnormalities Noted: No No Abnormalities Noted: No Moisture No  Abnormalities Noted: No Treatment Notes Wound #1 (Left Calcaneus) 1. Cleansed with: Clean wound with Normal Saline 2. Anesthetic Topical Lidocaine 4% cream to wound bed prior to debridement 4. Dressing Applied: Iodoflex 5. Secondary Dressing Applied Dry Gauze Foam Non-Adherent pad 7. Secured with Tape Notes heel cup Electronic Signature(s) Signed: 11/22/2017 5:09:47 PM By: Gretta Cool, BSN, RN, CWS, Kim RN, BSN Brizzi, Dejour L. ([id number]) Entered By: Gretta Cool, BSN, RN, CWS, Kim on 11/22/2017 14:32:40 Javid, Dmoni L. ([id number]) -------------------------------------------------------------------------------- Wound Assessment Details Patient Name: BATCH, Aaronmichael L. Date of Service: 11/22/2017 2:00 PM Medical Record Number: [id number] Patient Account Number: [id number] Date of Birth/Sex: Apr 16, 1926 (84 y.o. M) Treating RN: Cornell Barman Primary Care Hermine Feria: Ramonita Lab Other Clinician: Referring Sharonlee Nine: Ramonita Lab Treating Wende [place]streth/Extender: Melburn Hake, HOYT Weeks in Treatment: 13 Wound Status Wound Number: 2 Primary Etiology: Pressure Ulcer Wound Location: Right Calcaneus Wound Status: Open Wounding Event: Pressure Injury Date Acquired: 08/12/2017 Weeks Of Treatment: 13 Clustered Wound: No Wound Measurements Length: (cm) 1.5 Width: (cm) 2 Depth: (cm) 0.2 Area: (cm) 2.356 Volume: (cm) 0.471 % Reduction in Area: 79.5% % Reduction in Volume: 79.5% Wound Description Classification: Unstageable/Unclassified Periwound Skin Texture Texture Color No Abnormalities Noted: No No Abnormalities Noted: No Moisture No Abnormalities Noted: No Treatment Notes Wound #2 (Right Calcaneus) 1. Cleansed with: Clean wound with Normal Saline 2. Anesthetic Topical Lidocaine 4% cream to wound bed prior to debridement 4. Dressing Applied: Iodoflex 5. Secondary Dressing Applied Dry Gauze Foam Non-Adherent pad 7. Secured with Tape Notes heel cup Electronic Signature(s) Signed:  11/22/2017 5:09:47 PM By: Gretta Cool, BSN, RN, CWS, Kim RN, BSN Fodera, Raymie L. ([id number]) Entered By: Gretta Cool, BSN, RN, CWS, Kim on 11/22/2017 14:32:41 Stout, Breckin L. ([id number]) -------------------------------------------------------------------------------- Wound Assessment Details Patient Name: KRAYNAK, Keniel L. Date of Service: 11/22/2017 2:00 PM Medical Record Number: [id number] Patient Account Number: [id number] Date of Birth/Sex: 1926/01/25 (84 y.o. M) Treating RN: Cornell Barman Primary Care  Kenedy Haisley: Ramonita Lab Other Clinician: Referring Shantai Tiedeman: Ramonita Lab Treating Verniece Encarnacion/Extender: Melburn Hake, HOYT Weeks in Treatment: 13 Wound Status Wound Number: 3 Primary Etiology: Pressure Ulcer Wound Location: Right Toe Great Wound Status: Open Wounding Event: Pressure Injury Date Acquired: 09/02/2017 Weeks Of Treatment: 10 Clustered Wound: No Wound Measurements Length: (cm) 0.1 Width: (cm) 0.1 Depth: (cm) 0.1 Area: (cm) 0.008 Volume: (cm) 0.001 % Reduction in Area: 95.9% % Reduction in Volume: 95% Wound Description Classification: Category/Stage II Periwound Skin Texture Texture Color No Abnormalities Noted: No No Abnormalities Noted: No Moisture No Abnormalities Noted: No Treatment Notes Wound #3 (Right Toe Great) 1. Cleansed with: Clean wound with Normal Saline 2. Anesthetic Topical Lidocaine 4% cream to wound bed prior to debridement Notes Betadine Electronic Signature(s) Signed: 11/22/2017 5:09:47 PM By: Gretta Cool, BSN, RN, CWS, Kim RN, BSN Entered By: Gretta Cool, BSN, RN, CWS, Kim on 11/22/2017 14:32:41 Wahba, Marilyn L. ([id number]) -------------------------------------------------------------------------------- Vitals Details Patient Name: ODEM, Anthonny L. Date of Service: 11/22/2017 2:00 PM Medical Record Number: [id number] Patient Account Number: [id number] Date of Birth/Sex: 04/17/1926 (84 y.o. M) Treating RN: Cornell Barman Primary Care Gwenivere Hiraldo: Ramonita Lab Other  Clinician: Referring Caysen Whang: Ramonita Lab Treating Damien Cisar/Extender: Melburn Hake, HOYT Weeks in Treatment: 13 Vital Signs Time Taken: 14:25 Temperature (F): 97.7 Height (in): 70 Pulse (bpm): 68 Weight (lbs): 190 Respiratory Rate (breaths/min): 16 Body Mass Index (BMI): 27.3 Blood Pressure (mmHg): 118/55 Reference Range: 80 - 120 mg / dl Electronic Signature(s) Signed: 11/22/2017 5:09:47 PM By: Gretta Cool, BSN, RN, CWS, Kim RN, BSN Entered By: Gretta Cool, BSN, RN, CWS, Kim on 11/22/2017 14:25:25

## 2017-11-24 NOTE — Progress Notes (Signed)
Bonk, [name] ([id number]) Visit Report for 11/22/2017 Chief Complaint Document Details Patient Name: [name], [name] Date of Service: 11/22/2017 2:00 PM Medical Record Number: [id number] Patient Account Number: [id number] Date of Birth/Sex: 1926-03-07 (84 y.o. M) Treating RN: Montey Hora Primary Care Provider: Ramonita Lab Other Clinician: Referring Provider: Ramonita Lab Treating Provider/Extender: Melburn Hake, Kenyona Rena Weeks in Treatment: 13 Information Obtained from: Patient Chief Complaint Bilateral Heel pressure ulcers and bilateral great toe DTIs Electronic Signature(s) Signed: 11/23/2017 1:18:07 PM By: Worthy Keeler PA-C Entered By: Worthy Keeler on 11/22/2017 14:01:39 [name], [name] ([id number]) -------------------------------------------------------------------------------- Debridement Details Patient Name: [name], [name] Date of Service: 11/22/2017 2:00 PM Medical Record Number: [id number] Patient Account Number: [id number] Date of Birth/Sex: [birth year] (84 y.o. M) Treating RN: Montey Hora Primary Care Provider: Ramonita Lab Other Clinician: Referring Provider: Ramonita Lab Treating Provider/Extender: Melburn Hake, Raynard Mapps Weeks in Treatment: 13 Debridement Performed for Wound #1 Left Calcaneus Assessment: Performed By: Physician STONE III, [name] [place]utphen E., PA-C Debridement Type: Debridement Pre-procedure Verification/Time Yes - 15:11 Out Taken: Start Time: 15:11 Pain Control: Lidocaine 4% Topical Solution Total Area Debrided ([name] x W): 3 (cm) x 3.8 (cm) = 11.4 (cm) Tissue and other material Viable, Non-Viable, Slough, Subcutaneous, Slough debrided: Level: Skin/Subcutaneous Tissue Debridement Description: Excisional Instrument: Curette Bleeding: Minimum Hemostasis Achieved: Pressure End Time: 15:16 Procedural Pain: 0 Post Procedural Pain: 0 Response to Treatment: Procedure was tolerated well Level of Consciousness: Awake and Alert Post Procedure  Vitals: Temperature: 97.7 Pulse: 68 Respiratory Rate: 16 Blood Pressure: Systolic Blood Pressure: 427 Diastolic Blood Pressure: 55 Post Debridement Measurements of Total Wound Length: (cm) 3 Stage: Unstageable/Unclassified Width: (cm) 3.8 Depth: (cm) 0.7 Volume: (cm) 6.267 Character of Wound/Ulcer Post Improved Debridement: Post Procedure Diagnosis Same as Pre-procedure Electronic Signature(s) Signed: 11/22/2017 4:56:04 PM By: Montey Hora Signed: 11/23/2017 1:18:07 PM By: Worthy Keeler PA-C Entered By: Montey Hora on 11/22/2017 15:16:17 [name], [name] ([id number]) [name], [name] ([id number]) -------------------------------------------------------------------------------- Debridement Details Patient Name: [name], [name] Date of Service: 11/22/2017 2:00 PM Medical Record Number: [id number] Patient Account Number: [id number] Date of Birth/Sex: Oct 19, 1925 (84 y.o. M) Treating RN: Montey Hora Primary Care Provider: Ramonita Lab Other Clinician: Referring Provider: Ramonita Lab Treating Provider/Extender: Melburn Hake, Jazminn Pomales Weeks in Treatment: 13 Debridement Performed for Wound #2 Right Calcaneus Assessment: Performed By: Physician STONE III, Suren Payne E., PA-C Debridement Type: Debridement Pre-procedure Verification/Time Yes - 15:09 Out Taken: Start Time: 15:09 Pain Control: Lidocaine 4% Topical Solution Total Area Debrided ([name] x W): 1.5 (cm) x 2 (cm) = 3 (cm) Tissue and other material Viable, Non-Viable, Slough, Subcutaneous, Slough debrided: Level: Skin/Subcutaneous Tissue Debridement Description: Excisional Instrument: Curette Bleeding: Moderate Hemostasis Achieved: Silver Nitrate End Time: 15:11 Procedural Pain: 0 Post Procedural Pain: 0 Response to Treatment: Procedure was tolerated well Level of Consciousness: Awake and Alert Post Procedure Vitals: Temperature: 97.7 Pulse: 68 Respiratory Rate: 16 Blood Pressure: Systolic Blood Pressure:  854 Diastolic Blood Pressure: 55 Post Debridement Measurements of Total Wound Length: (cm) 1.5 Stage: Unstageable/Unclassified Width: (cm) 2 Depth: (cm) 0.3 Volume: (cm) 0.707 Character of Wound/Ulcer Post Improved Debridement: Post Procedure Diagnosis Same as Pre-procedure Electronic Signature(s) Signed: 11/22/2017 4:56:04 PM By: Montey Hora Signed: 11/23/2017 1:18:07 PM By: Worthy Keeler PA-C Entered By: Montey Hora on 11/22/2017 15:34:40 [name], [name] ([id number]) [place], [name] ([id number]) -------------------------------------------------------------------------------- HPI Details Patient Name: [name], [name] Date of Service: 11/22/2017 2:00 PM Medical Record Number: [id number] Patient Account Number: [id number] Date of Birth/Sex: [birth year] (84 y.o. M) Treating RN:  Montey Hora Primary Care Provider: Ramonita Lab Other Clinician: Referring Provider: Ramonita Lab Treating Provider/Extender: Melburn Hake, Lynsay Fesperman Weeks in Treatment: 13 History of Present Illness Associated Signs and Symptoms: Patient has a history of chronic atrophic relation, hypertension, and long-term use of anticoagulants. He also had a fall with pelvic fracture which occurred on January 2019. HPI Description: 08/19/17 patient presents today for initial evaluation and our clinic concerning issues he has been having with the bilateral heels as well as the lateral great toe was since he was admitted to the nursing facility following a pelvic fracture. Initially he was spending a significant amount of time in the bed although he is now up more often since the healing process has progressed along the way obviously. When he is up he's spending most of the time sitting in a wheelchair at this point. When he is in bed he lays on his back and the covers/sheets are pushing down on his toes bilaterally which I think it may be what's causing the issues with his deep tissue injury to the bilateral great toes.  With that being said the hills also appear to be pressure in nature and I think that laying in the bed getting pressure to the sites is what has led to the formation of these on stage will pressure ulcers. Fortunately there does not appear to be any infection in both areas of ulceration of the heel appear to be stable at this point. Patient does not have any significant pain at the site she does have some of the eschar along the edges which is starting to lift up and come all hopefully slowly but surely this will occur. Patient did have arterial studies performed April 2018 which showed a normal TBI on the right knee slightly depressed TBI on the left but this does not appear to have changed significantly at that point. Specifically this was 0.75 on the right and 0.61 on the left. 09/13/17 on evaluation today patient presents for a follow-up evaluation regarding his bilateral heal ulcers. He has been tolerating the dressing changes fairly well although he did have some bleeding earlier today when it appears some of the skin on the surrounding portion of the wound got pulled where this is starting to flake off. Subsequently there does not appear to be any bleeding right now. Since I last saw him he has been discharged from the facility and is now back at home. His wife is having a very difficult time being able to get him up and in the car in order to come for an appointment which is why they missed the last appointment have not been able to come back sooner. Fortunately the he does not have any signs of infection. No fevers chills noted. He does have difficulty walking due to pain in the bilateral heels. 10/11/17 on evaluation today patient presents for follow-up evaluation concerning his ongoing bilateral heal ulcers. He has been tolerating the dressing changes which were Betadine paints daily without complication. I have not seen him since September 13, 2017. Pressure that I saw him August 19, 2017.  Subsequently those are the only prior visits before today. His left heel actually seems to have a significant amount of eschar that is starting to lift up at this point and seems to be a little more loose as far as drainage building up underneath the eschar. The right eschar though there are some parts lifting up seems to still be fairly stable which is good news. His toe  ulcer is almost completely healed a lot of that eschar came off and it looks very well underneath. 10/25/17 on evaluation today patient presents today regarding his bilateral heal ulcers in his right great toe ulcer. At this point his heels actually have loosened and softened to the point that I think we do need to remove these at this time. With that being said I think this will actually help them to heal much better and much more quickly currently. With that being said patient was a little bit worried about the debridement as far as pain was concerned fortunately this seemed to go very well today. 11/08/17 evaluation today patient's hills actually appear to be doing better compared to last 2 week's evaluation. The right heel especially is doing great and show signs of excellent granulation at this time. With that being said the left heel is much deeper and I was able to clean away a lot of the slough and subcutaneous tissue at this point he still has some noted that will take time to clear away. With that being said he has been tolerating the dressing changes will complication the doesn't of been some issues with getting the proper supplies I'm not exactly sure what the issue is there however we are to contact home health and try to figure this out. In the meantime fortunately he does not seem to have any significant pain on the right he does have some pain walking on the left which makes sense. 11/15/17 on evaluation today patient appears to be doing well at this point in time in regard to his bilateral heal ulcers. He does Tworek,  Nicolo [name]. ([id number]) still have slough noted over both especially the left although both seem to be making signs of improvement. Currently there is no evidence of infection he does have some discomfort on the left really nothing on the right. 11/22/17 on evaluation today patient appears to be doing very well in regard to his heels which are definitely making progress. This has been slow but nonetheless is definitely progressing. Fortunately there is no evidence of infection No fevers, chills, nausea, or vomiting noted at this time. We are still having issues with supplies and getting the Iodoflex for the patient at home. Electronic Signature(s) Signed: 11/23/2017 1:18:07 PM By: Worthy Keeler PA-C Entered By: Worthy Keeler on 11/22/2017 15:20:05 [name], [name] ([id number]) -------------------------------------------------------------------------------- Physical Exam Details Patient Name: [name], [name] Date of Service: 11/22/2017 2:00 PM Medical Record Number: [id number] Patient Account Number: [id number] Date of Birth/Sex: [birth year] (84 y.o. M) Treating RN: Montey Hora Primary Care Provider: Ramonita Lab Other Clinician: Referring Provider: Ramonita Lab Treating Provider/Extender: Melburn Hake, Hadriel Northup Weeks in Treatment: 63 Constitutional Well-nourished and well-hydrated in no acute distress. Respiratory normal breathing without difficulty. Psychiatric this patient is able to make decisions and demonstrates good insight into disease process. Alert and Oriented x 3. pleasant and cooperative. Notes At this point on evaluation the patient seems to be doing very well he is having less pain I feel like we by week compared to previous. With that being said he has been tolerating the dressing changes without complication still were having trouble getting the appropriate dressings on time. Electronic Signature(s) Signed: 11/23/2017 1:18:07 PM By: Worthy Keeler PA-C Entered By: Worthy Keeler on 11/22/2017 15:20:45 [name], [name] ([id number]) -------------------------------------------------------------------------------- Physician Orders Details Patient Name: [name], [name] Date of Service: 11/22/2017 2:00 PM Medical Record Number: [id number] Patient Account Number: [id number] Date of Birth/Sex: 1925-09-11 (  84 y.o. M) Treating RN: Montey Hora Primary Care Provider: Ramonita Lab Other Clinician: Referring Provider: Ramonita Lab Treating Provider/Extender: Melburn Hake, Amoree Newlon Weeks in Treatment: 31 Verbal / Phone Orders: No Diagnosis Coding ICD-10 Coding Code Description L89.620 Pressure ulcer of left heel, unstageable L89.610 Pressure ulcer of right heel, unstageable I48.2 Chronic atrial fibrillation I10 Essential (primary) hypertension Z79.01 Long term (current) use of anticoagulants Wound Cleansing Wound #1 Left Calcaneus o Clean wound with Normal Saline. o Cleanse wound with mild soap and water o May Shower, gently pat wound dry prior to applying new dressing. Wound #2 Right Calcaneus o Clean wound with Normal Saline. o Cleanse wound with mild soap and water o May Shower, gently pat wound dry prior to applying new dressing. Wound #3 Right Toe Great o Clean wound with Normal Saline. o Cleanse wound with mild soap and water o May Shower, gently pat wound dry prior to applying new dressing. Primary Wound Dressing Wound #1 Left Calcaneus o Iodoflex Wound #2 Right Calcaneus o Iodoflex Wound #3 Right Toe Great o Other: - betadine paint Secondary Dressing Wound #1 Left Calcaneus o Dry Gauze o Foam Wound #2 Right Calcaneus o Dry Gauze o Foam Bebeau, Theon [name]. ([id number]) Dressing Change Frequency Wound #1 Left Calcaneus o Change dressing every day. o Change dressing twice daily. - morning and evening Wound #2 Right Calcaneus o Change dressing every day. o Change dressing twice daily. - morning and evening Wound  #3 Right Toe Great o Change dressing every day. o Change dressing twice daily. - morning and evening Follow-up Appointments Wound #1 Left Calcaneus o Return Appointment in 2 weeks. Wound #2 Right Calcaneus o Return Appointment in 2 weeks. Wound #3 Right Toe Great o Return Appointment in 2 weeks. Off-Loading Wound #1 Left Calcaneus o Heel suspension boot to: - Please order prevalon boots for bilateral feet Please float heel when pt is in the bed (NO PRESSURE ON HEELS) o Turn and reposition every 2 hours o Other: - Adjustable Blanket Lift Bar so the sheets and blanket does not put pressure on the toes Wound #2 Right Calcaneus o Heel suspension boot to: - Please order prevalon boots for bilateral feet Please float heel when pt is in the bed (NO PRESSURE ON HEELS) o Turn and reposition every 2 hours o Other: - Adjustable Blanket Lift Bar so the sheets and blanket does not put pressure on the toes Wound #3 Right Toe Great o Heel suspension boot to: - Please order prevalon boots for bilateral feet Please float heel when pt is in the bed (NO PRESSURE ON HEELS) o Turn and reposition every 2 hours o Other: - Adjustable Blanket Lift Bar so the sheets and blanket does not put pressure on the toes Additional Orders / Instructions Wound #1 Left Calcaneus o Increase protein intake. Wound #2 Right Calcaneus o Increase protein intake. Wound #3 Right Toe Great o Increase protein intake. Home Health Wound #1 Left Calcaneus o Continue Home Health Visits Szymborski, Malosi [name]. ([id number]) o Home Health Nurse may visit PRN to address patientos wound care needs. o FACE TO FACE ENCOUNTER: MEDICARE and MEDICAID PATIENTS: I certify that this patient is under my care and that I had a face-to-face encounter that meets the physician face-to-face encounter requirements with this patient on this date. The encounter with the patient was in whole or in part for the  following MEDICAL CONDITION: (primary reason for [place]) MEDICAL NECESSITY: I certify, that based on my findings, NURSING services are  a medically necessary home health service. HOME BOUND STATUS: I certify that my clinical findings support that this patient is homebound (i.e., Due to illness or injury, pt requires aid of supportive devices such as crutches, cane, wheelchairs, walkers, the use of special transportation or the assistance of another person to leave their place of residence. There is a normal inability to leave the home and doing so requires considerable and taxing effort. Other absences are for medical reasons / religious services and are infrequent or of short duration when for other reasons). o If current dressing causes regression in wound condition, may D/C ordered dressing product/s and apply Normal Saline Moist Dressing daily until next [place] / Other MD appointment. [place] of regression in wound condition at [phone]. o Please direct any NON-WOUND related issues/requests for orders to patient's Primary Care Physician Wound #2 Right [place] Nurse may visit PRN to address patientos wound care needs. o FACE TO FACE ENCOUNTER: MEDICARE and MEDICAID PATIENTS: I certify that this patient is under my care and that I had a face-to-face encounter that meets the physician face-to-face encounter requirements with this patient on this date. The encounter with the patient was in whole or in part for the following MEDICAL CONDITION: (primary reason for [place]) MEDICAL NECESSITY: I certify, that based on my findings, NURSING services are a medically necessary home health service. HOME BOUND STATUS: I certify that my clinical findings support that this patient is homebound (i.e., Due to illness or injury, pt requires aid of supportive devices such as crutches, cane, wheelchairs,  walkers, the use of special transportation or the assistance of another person to leave their place of residence. There is a normal inability to leave the home and doing so requires considerable and taxing effort. Other absences are for medical reasons / religious services and are infrequent or of short duration when for other reasons). o If current dressing causes regression in wound condition, may D/C ordered dressing product/s and apply Normal Saline Moist Dressing daily until next [place] / Other MD appointment. [place] of regression in wound condition at [phone]. o Please direct any NON-WOUND related issues/requests for orders to patient's Primary Care Physician Wound #3 Right Toe [place] Nurse may visit PRN to address patientos wound care needs. o FACE TO FACE ENCOUNTER: MEDICARE and MEDICAID PATIENTS: I certify that this patient is under my care and that I had a face-to-face encounter that meets the physician face-to-face encounter requirements with this patient on this date. The encounter with the patient was in whole or in part for the following MEDICAL CONDITION: (primary reason for [place]) MEDICAL NECESSITY: I certify, that based on my findings, NURSING services are a medically necessary home health service. HOME BOUND STATUS: I certify that my clinical findings support that this patient is homebound (i.e., Due to illness or injury, pt requires aid of supportive devices such as crutches, cane, wheelchairs, walkers, the use of special transportation or the assistance of another person to leave their place of residence. There is a normal inability to leave the home and doing so requires considerable and taxing effort. Other absences are for medical reasons / religious services and are infrequent or of short duration when for other reasons). o If current dressing causes regression in  wound condition, may D/C ordered dressing product/s and apply Normal Saline Moist Dressing daily until  next [place] / Other MD appointment. [place] of regression in wound condition at [phone]. o Please direct any NON-WOUND related issues/requests for orders to patient's Primary Care Physician Electronic Signature(s) Signed: 11/22/2017 4:56:04 PM By: Montey Hora Signed: 11/23/2017 1:18:07 PM By: Worthy Keeler PA-C Entered By: Montey Hora on 11/22/2017 15:17:40 Tennell, Zavian [name]. ([id number]) Mangieri, [place] ([id number]) -------------------------------------------------------------------------------- Problem List Details Patient Name: [name], [name] Date of Service: 11/22/2017 2:00 PM Medical Record Number: [id number] Patient Account Number: [id number] Date of Birth/Sex: [birth year] (84 y.o. M) Treating RN: Montey Hora Primary Care Provider: Ramonita Lab Other Clinician: Referring Provider: Ramonita Lab Treating Provider/Extender: Melburn Hake, Mieshia Pepitone Weeks in Treatment: 13 Active Problems ICD-10 Impacting Encounter Code Description Active Date Wound Healing Diagnosis L89.620 Pressure ulcer of left heel, unstageable 08/19/2017 Yes L89.610 Pressure ulcer of right heel, unstageable 08/19/2017 Yes I48.2 Chronic atrial fibrillation 08/19/2017 Yes I10 Essential (primary) hypertension 08/19/2017 Yes Z79.01 Long term (current) use of anticoagulants 08/19/2017 Yes Inactive Problems Resolved Problems Electronic Signature(s) Signed: 11/23/2017 1:18:07 PM By: Worthy Keeler PA-C Entered By: Worthy Keeler on 11/22/2017 14:01:33 [name], [name] ([id number]) -------------------------------------------------------------------------------- Progress Note Details Patient Name: Segler, [name] Date of Service: 11/22/2017 2:00 PM Medical Record Number: [id number] Patient Account Number: [id number] Date of Birth/Sex: August 02, 1925 (84 y.o. M) Treating RN: Montey Hora Primary Care Provider: Ramonita Lab Other Clinician: Referring Provider: Ramonita Lab Treating Provider/Extender: Melburn Hake, Chasey Dull Weeks in Treatment: 13 Subjective Chief Complaint Information obtained from Patient Bilateral Heel pressure ulcers and bilateral great toe DTIs History of Present Illness (HPI) The following HPI elements were documented for the patient's wound: Associated Signs and Symptoms: Patient has a history of chronic atrophic relation, hypertension, and long-term use of anticoagulants. He also had a fall with pelvic fracture which occurred on January 2019. 08/19/17 patient presents today for initial evaluation and our clinic concerning issues he has been having with the bilateral heels as well as the lateral great toe was since he was admitted to the nursing facility following a pelvic fracture. Initially he was spending a significant amount of time in the bed although he is now up more often since the healing process has progressed along the way obviously. When he is up he's spending most of the time sitting in a wheelchair at this point. When he is in bed he lays on his back and the covers/sheets are pushing down on his toes bilaterally which I think it may be what's causing the issues with his deep tissue injury to the bilateral great toes. With that being said the hills also appear to be pressure in nature and I think that laying in the bed getting pressure to the sites is what has led to the formation of these on stage will pressure ulcers. Fortunately there does not appear to be any infection in both areas of ulceration of the heel appear to be stable at this point. Patient does not have any significant pain at the site she does have some of the eschar along the edges which is starting to lift up and come all hopefully slowly but surely this will occur. Patient did have arterial studies performed April 2018 which showed a normal TBI on the right knee slightly  depressed TBI on the left but this does not appear to have changed significantly at that point. Specifically this was 0.75 on the right and 0.61 on the left. 09/13/17 on evaluation today patient presents for a follow-up evaluation regarding  his bilateral heal ulcers. He has been tolerating the dressing changes fairly well although he did have some bleeding earlier today when it appears some of the skin on the surrounding portion of the wound got pulled where this is starting to flake off. Subsequently there does not appear to be any bleeding right now. Since I last saw him he has been discharged from the facility and is now back at home. His wife is having a very difficult time being able to get him up and in the car in order to come for an appointment which is why they missed the last appointment have not been able to come back sooner. Fortunately the he does not have any signs of infection. No fevers chills noted. He does have difficulty walking due to pain in the bilateral heels. 10/11/17 on evaluation today patient presents for follow-up evaluation concerning his ongoing bilateral heal ulcers. He has been tolerating the dressing changes which were Betadine paints daily without complication. I have not seen him since September 13, 2017. Pressure that I saw him August 19, 2017. Subsequently those are the only prior visits before today. His left heel actually seems to have a significant amount of eschar that is starting to lift up at this point and seems to be a little more loose as far as drainage building up underneath the eschar. The right eschar though there are some parts lifting up seems to still be fairly stable which is good news. His toe ulcer is almost completely healed a lot of that eschar came off and it looks very well underneath. 10/25/17 on evaluation today patient presents today regarding his bilateral heal ulcers in his right great toe ulcer. At this point his heels actually have loosened  and softened to the point that I think we do need to remove these at this time. With that being said I think this will actually help them to heal much better and much more quickly currently. With that being said patient was a little bit worried about the debridement as far as pain was concerned fortunately this seemed to go very well today. 11/08/17 evaluation today patient's hills actually appear to be doing better compared to last 2 week's evaluation. The right heel [place], [place] ([id number]) especially is doing great and show signs of excellent granulation at this time. With that being said the left heel is much deeper and I was able to clean away a lot of the slough and subcutaneous tissue at this point he still has some noted that will take time to clear away. With that being said he has been tolerating the dressing changes will complication the doesn't of been some issues with getting the proper supplies I'm not exactly sure what the issue is there however we are to contact home health and try to figure this out. In the meantime fortunately he does not seem to have any significant pain on the right he does have some pain walking on the left which makes sense. 11/15/17 on evaluation today patient appears to be doing well at this point in time in regard to his bilateral heal ulcers. He does still have slough noted over both especially the left although both seem to be making signs of improvement. Currently there is no evidence of infection he does have some discomfort on the left really nothing on the right. 11/22/17 on evaluation today patient appears to be doing very well in regard to his heels which are definitely making progress. This has  been slow but nonetheless is definitely progressing. Fortunately there is no evidence of infection No fevers, chills, nausea, or vomiting noted at this time. We are still having issues with supplies and getting the Iodoflex for the patient at home. Patient  History Information obtained from Patient. Family History No family history of Cancer, Diabetes, Heart Disease, Hereditary Spherocytosis, Hypertension, Kidney Disease, Lung Disease, Seizures, Stroke, Thyroid Problems, Tuberculosis. Social History Former smoker, Marital Status - Married, Alcohol Use - Rarely - 2oz day, Drug Use - No History, Caffeine Use - Daily. Review of Systems (ROS) Constitutional Symptoms (General Health) Denies complaints or symptoms of Fever, Chills. Respiratory The patient has no complaints or symptoms. Cardiovascular The patient has no complaints or symptoms. Psychiatric The patient has no complaints or symptoms. Objective Constitutional Well-nourished and well-hydrated in no acute distress. Vitals Time Taken: 2:25 PM, Height: 70 in, Weight: 190 lbs, BMI: 27.3, Temperature: 97.7 F, Pulse: 68 bpm, Respiratory Rate: 16 breaths/min, Blood Pressure: 118/55 mmHg. Respiratory normal breathing without difficulty. Psychiatric this patient is able to make decisions and demonstrates good insight into disease process. Alert and Oriented x 3. pleasant [place], [place] ([id number]) and cooperative. General Notes: At this point on evaluation the patient seems to be doing very well he is having less pain I feel like we by week compared to previous. With that being said he has been tolerating the dressing changes without complication still were having trouble getting the appropriate dressings on time. Integumentary (Hair, Skin) Wound #1 status is Open. Original cause of wound was Pressure Injury. The wound is located on the Left Calcaneus. The wound measures 3cm length x 3.8cm width x 0.5cm depth; 8.954cm^2 area and 4.477cm^3 volume. Wound #2 status is Open. Original cause of wound was Pressure Injury. The wound is located on the Right Calcaneus. The wound measures 1.5cm length x 2cm width x 0.2cm depth; 2.356cm^2 area and 0.471cm^3 volume. Wound #3 status is Open.  Original cause of wound was Pressure Injury. The wound is located on the Right Toe Great. The wound measures 0.1cm length x 0.1cm width x 0.1cm depth; 0.008cm^2 area and 0.001cm^3 volume. 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 There was a Excisional Skin/Subcutaneous Tissue Debridement with a total area of 11.4 sq cm performed by STONE III, Arihaan Bellucci E., PA-C. With the following instrument(s): Curette to remove Viable and Non-Viable tissue/material. Material removed includes Subcutaneous Tissue and Slough and after achieving pain control using Lidocaine 4% Topical Solution. No specimens were taken. A time out was conducted at 15:11, prior to the start of the procedure. A Minimum amount of bleeding was controlled with Pressure. The procedure was tolerated well with a pain level of 0 throughout and a pain level of 0 following the procedure. Patient s Level of Consciousness post procedure was recorded as Awake and Alert and post-procedure vitals were taken including Temperature: 97.7 F, Pulse: 68 bpm, Respiratory Rate: 16 breaths/min, Blood Pressure: (118)/(55) mmHg. Post Debridement Measurements: 3cm length x 3.8cm width x 0.7cm depth; 6.267cm^3 volume. Post debridement Stage noted as Unstageable/Unclassified. Character of Wound/Ulcer Post Debridement is improved. Post procedure Diagnosis Wound #1: Same as Pre-Procedure Wound #2 Pre-procedure diagnosis of Wound #2 is a Pressure Ulcer located on the  Right Calcaneus . There was a Excisional Skin/Subcutaneous Tissue Debridement with a total area of 3 sq cm performed by STONE III, Kharma Sampsel E., PA-C. With the following  instrument(s): Curette to remove Viable and Non-Viable tissue/material. Material removed includes Subcutaneous Tissue and Slough and after achieving pain control using Lidocaine 4% Topical Solution. No specimens were taken. A time [place], [place] ([id number]) out was conducted at 15:09, prior to the start of the procedure. A Minimum amount of bleeding was controlled with Pressure. The procedure was tolerated well with a pain level of 0 throughout and a pain level of 0 following the procedure. Patient s Level of Consciousness post procedure was recorded as Awake and Alert and post-procedure vitals were taken including Temperature: 97.7 F, Pulse: 68 bpm, Respiratory Rate: 16 breaths/min, Blood Pressure: (118)/(55) mmHg. Post Debridement Measurements: 1.5cm length x 2cm width x 0.3cm depth; 0.707cm^3 volume. Post debridement Stage noted as Unstageable/Unclassified. Character of Wound/Ulcer Post Debridement is improved. Post procedure Diagnosis Wound #2: Same as Pre-Procedure Plan Wound Cleansing: Wound #1 Left Calcaneus: Clean wound with Normal Saline. Cleanse wound with mild soap and water May Shower, gently pat wound dry prior to applying new dressing. Wound #2 Right Calcaneus: Clean wound with Normal Saline. Cleanse wound with mild soap and water May Shower, gently pat wound dry prior to applying new dressing. Wound #3 Right Toe Great: Clean wound with Normal Saline. Cleanse wound with mild soap and water May Shower, gently pat wound dry prior to applying new dressing. Primary Wound Dressing: Wound #1 Left Calcaneus: Iodoflex Wound #2 Right Calcaneus: Iodoflex Wound #3 Right Toe Great: Other: - betadine paint Secondary Dressing: Wound #1 Left Calcaneus: Dry Gauze Foam Wound #2 Right Calcaneus: Dry Gauze Foam Dressing Change Frequency: Wound #1 Left Calcaneus: Change dressing every day. Change dressing twice daily. - morning and evening Wound #2 Right Calcaneus: Change  dressing every day. Change dressing twice daily. - morning and evening Wound #3 Right Toe Great: Change dressing every day. Change dressing twice daily. - morning and evening Follow-up Appointments: Wound #1 Left Calcaneus: Return Appointment in 2 weeks. Wound #2 Right Calcaneus: Return Appointment in 2 weeks. Wound #3 Right Toe Great: [place], [place] ([id number]) Return Appointment in 2 weeks. Off-Loading: Wound #1 Left Calcaneus: Heel suspension boot to: - Please order prevalon boots for bilateral feet Please float heel when pt is in the bed (NO PRESSURE ON HEELS) Turn and reposition every 2 hours Other: - Adjustable Blanket Lift Bar so the sheets and blanket does not put pressure on the toes Wound #2 Right Calcaneus: Heel suspension boot to: - Please order prevalon boots for bilateral feet Please float heel when pt is in the bed (NO PRESSURE ON HEELS) Turn and reposition every 2 hours Other: - Adjustable Blanket Lift Bar so the sheets and blanket does not put pressure on the toes Wound #3 Right Toe Great: Heel suspension boot to: - Please order prevalon boots for bilateral feet Please float heel when pt is in the bed (NO PRESSURE ON HEELS) Turn and reposition every 2 hours Other: - Adjustable Blanket Lift Bar so the sheets and blanket does not put pressure on the toes Additional Orders / Instructions: Wound #1 Left Calcaneus: Increase protein intake. Wound #2 Right Calcaneus: Increase protein intake. Wound #3 Right Toe Great: Increase protein intake. Home Health: Wound #1 Left Calcaneus: [place] Nurse may visit PRN to address patient s wound care needs. FACE TO FACE ENCOUNTER: MEDICARE and MEDICAID  PATIENTS: I certify that this patient is under my care and that I had a face-to-face encounter that meets the physician face-to-face encounter requirements with this patient on this date. The encounter with the patient was in whole or in part  for the following MEDICAL CONDITION: (primary reason for [place]) MEDICAL NECESSITY: I certify, that based on my findings, NURSING services are a medically necessary home health service. HOME BOUND STATUS: I certify that my clinical findings support that this patient is homebound (i.e., Due to illness or injury, pt requires aid of supportive devices such as crutches, cane, wheelchairs, walkers, the use of special transportation or the assistance of another person to leave their place of residence. There is a normal inability to leave the home and doing so requires considerable and taxing effort. Other absences are for medical reasons / religious services and are infrequent or of short duration when for other reasons). If current dressing causes regression in wound condition, may D/C ordered dressing product/s and apply Normal Saline Moist Dressing daily until next [place] / Other MD appointment. [place] of regression in wound condition at [phone]. Please direct any NON-WOUND related issues/requests for orders to patient's Primary Care Physician Wound #2 Right Calcaneus: [place] Nurse may visit PRN to address patient s wound care needs. FACE TO FACE ENCOUNTER: MEDICARE and MEDICAID PATIENTS: I certify that this patient is under my care and that I had a face-to-face encounter that meets the physician face-to-face encounter requirements with this patient on this date. The encounter with the patient was in whole or in part for the following MEDICAL CONDITION: (primary reason for [place]) MEDICAL NECESSITY: I certify, that based on my findings, NURSING services are a medically necessary home health service. HOME BOUND STATUS: I certify that my clinical findings support that this patient is homebound (i.e., Due to illness or injury, pt requires aid of supportive devices such as crutches, cane, wheelchairs, walkers, the  use of special transportation or the assistance of another person to leave their place of residence. There is a normal inability to leave the home and doing so requires considerable and taxing effort. Other absences are for medical reasons / religious services and are infrequent or of short duration when for other reasons). If current dressing causes regression in wound condition, may D/C ordered dressing product/s and apply Normal Saline Moist Dressing daily until next [place] / Other MD appointment. [place] of regression in wound condition at [phone]. Please direct any NON-WOUND related issues/requests for orders to patient's Primary Care Physician Wound #3 Right Toe Great: [place] Nurse may visit PRN to address patient s wound care needs. [place], Leary [name]. ([id number]) FACE TO FACE ENCOUNTER: MEDICARE and MEDICAID PATIENTS: I certify that this patient is under my care and that I had a face-to-face encounter that meets the physician face-to-face encounter requirements with this patient on this date. The encounter with the patient was in whole or in part for the following MEDICAL CONDITION: (primary reason for [place]) MEDICAL NECESSITY: I certify, that based on my findings, NURSING services are a medically necessary home health service. HOME BOUND STATUS: I certify that my clinical findings support that this patient is homebound (i.e., Due to illness or injury, pt requires aid of supportive devices such as crutches, cane, wheelchairs, walkers, the use of special transportation or the assistance of another person to leave their place of residence.  There is a normal inability to leave the home and doing so requires considerable and taxing effort. Other absences are for medical reasons / religious services and are infrequent or of short duration when for other reasons). If current dressing causes regression in wound  condition, may D/C ordered dressing product/s and apply Normal Saline Moist Dressing daily until next [place] / Other MD appointment. [place] of regression in wound condition at [phone]. Please direct any NON-WOUND related issues/requests for orders to patient's Primary Care Physician I'm gonna suggest that we continue with the Current wound care measures for the next week. I do think the Iodoflex is the best thing for him and when he has it on it seems to do a great job. With that being said we're having trouble getting this to [facility]. We will definitely get in touch with him again and see what we can do as far as for your how much would be for the patient to purchase the Iodoflex to be used through well care. So far they have not told them I did asked them to do so last Friday will be called. If this does not work out then we may just need to discontinue home health altogether and see about getting supplies during medical supply company. We will see. Please see above for specific wound care orders. We will see patient for re-evaluation in 1 week(s) here in the clinic. If anything worsens or changes patient will contact our office for additional recommendations. Electronic Signature(s) Signed: 11/23/2017 1:18:07 PM By: Worthy Keeler PA-C Entered By: Worthy Keeler on 11/22/2017 15:21:24 Yacoub, Natan [name]. ([id number]) -------------------------------------------------------------------------------- ROS/PFSH Details Patient Name: GIAMMARCO, Tiburcio [name]. Date of Service: 11/22/2017 2:00 PM Medical Record Number: [id number] Patient Account Number: [id number] Date of Birth/Sex: 1925-11-01 (84 y.o. M) Treating RN: Montey Hora Primary Care Provider: Ramonita Lab Other Clinician: Referring Provider: Ramonita Lab Treating Provider/Extender: Melburn Hake, Ulyess Muto Weeks in Treatment: 13 Information Obtained From Patient Wound History Do you currently have one or more  open woundso Yes How many open wounds do you currently haveo 2 Approximately how long have you had your woundso 1 week How have you been treating your wound(s) until nowo nothing Has your wound(s) ever healed and then re-openedo No Have you had any lab work done in the past montho No Have you tested positive for osteomyelitis (bone infection)o No Have you had any tests for circulation on your legso No Have you had other problems associated with your woundso Swelling Constitutional Symptoms (General Health) Complaints and Symptoms: Negative for: Fever; Chills Respiratory Complaints and Symptoms: No Complaints or Symptoms Cardiovascular Complaints and Symptoms: No Complaints or Symptoms Medical History: Positive for: Arrhythmia - a-fib; Hypertension Oncologic Medical History: Positive for: Received Radiation Psychiatric Complaints and Symptoms: No Complaints or Symptoms Immunizations Pneumococcal Vaccine: Received Pneumococcal Vaccination: Yes Implantable Devices Family and Social History Weber, Kolbi [name]. ([id number]) Cancer: No; Diabetes: No; Heart Disease: No; Hereditary Spherocytosis: No; Hypertension: No; Kidney Disease: No; Lung Disease: No; Seizures: No; Stroke: No; Thyroid Problems: No; Tuberculosis: No; Former smoker; Marital Status - Married; Alcohol Use: Rarely - 2oz day; Drug Use: No History; Caffeine Use: Daily; Financial Concerns: No; Food, Clothing or Shelter Needs: No; Support System Lacking: No; Transportation Concerns: No; Advanced Directives: Yes (Not Provided); Patient does not want information on Advanced Directives; Do not resuscitate: No; Living Will: Yes (Not Provided); Medical Power of Attorney: No Physician Affirmation I have reviewed and agree with the above  information. Electronic Signature(s) Signed: 11/22/2017 4:56:04 PM By: Montey Hora Signed: 11/23/2017 1:18:07 PM By: Worthy Keeler PA-C Entered By: Worthy Keeler on 11/22/2017  15:20:24 Varon, Torry [name]. ([id number]) -------------------------------------------------------------------------------- SuperBill Details Patient Name: GULLEY, Irvan [name]. Date of Service: 11/22/2017 Medical Record Number: [id number] Patient Account Number: [id number] Date of Birth/Sex: 1926/07/08 (84 y.o. M) Treating RN: Montey Hora Primary Care Provider: [facility]onita [facility] Other Clinician: Referring Provider: [facility]onita [facility] Treating Provider/Extender: Melburn Hake, Alyssha Housh Weeks in Treatment: 13 Diagnosis Coding ICD-10 Codes Code Description L89.620 Pressure ulcer of left heel, unstageable L89.610 Pressure ulcer of right heel, unstageable I48.2 Chronic atrial fibrillation I10 Essential (primary) hypertension Z79.01 Long term (current) use of anticoagulants Facility Procedures CPT4 Code: [id number] Description: 03009 - DEB SUBQ TISSUE 20 SQ CM/< ICD-10 Diagnosis Description L89.620 Pressure ulcer of left heel, unstageable L89.610 Pressure ulcer of right heel, unstageable Modifier: Quantity: 1 Physician Procedures CPT4 Code: 2330076 Description: 22633 - WC PHYS SUBQ TISS 20 SQ CM ICD-10 Diagnosis Description L89.620 Pressure ulcer of left heel, unstageable L89.610 Pressure ulcer of right heel, unstageable Modifier: Quantity: 1 Electronic Signature(s) Signed: 11/23/2017 1:18:07 PM By: Worthy Keeler PA-C Entered By: Worthy Keeler on 11/22/2017 15:21:49

## 2017-11-27 ENCOUNTER — Other Ambulatory Visit: Payer: Self-pay | Admitting: *Deleted

## 2017-11-27 NOTE — Patient Outreach (Addendum)
[facility] ([facility]) Care Management  11/27/2017  [name] L Scovill Jr. 10/21/1925 [id number]   Telephone assessment call  84year old male, on 1/7 ED admission after fall at home noted pubic rami fracture, no surgical plan, WBAT on RLE.  Discharged to [facility] on 1/9, DC to home on 08/23/17.  PMH includes : Hypertension , chronic atrial fib, bilateral heel ulcers, PVD, osteoarthritis.   5/22 Referral from [facility] utilization management department :  To recommend intervention to clarify how to best the plan can can assist in meeting member needs.   Successful outreach call to patient able to speak with wife [name], HIPAA information verified.   Spouse reports patient wound healing is progressing , she states today is patient last home health nurse visit.  Patient will continue to attend wound care center visits weekly and wife will change dressings on other day. Wife discussed  [facility] home health RN assisted with ordering needed supplies for continued  wound care at home  from [employer] that accepts insurance  and wife anticipates delivery in the next week. Spouse they have ordered previous supplies from company in a smaller size but they have been able to make it work. Encouraged her to take supplies to wound visit on this week to ensure proper supplies on hand.   Wife reports patient tolerating mobility in home but heel wound discomfort limitations.   Assessment  Patient will benefit continued care management follow up , regarding wound healing and ensuring needed supplies on hand.  Plan Will continue to follow patient for care management and care coordination needs.  Reinforced with wife to notify CM if concerns regarding having adequate supplies for wound care.  Will send PCP quarterly update .  Will plan follow up call in the next month  [facility] CM Care Plan Problem One     Most Recent Value  Care Plan Problem One  Impaired skin integrity related to  bilateral heel wounds   Role Documenting the Problem One  Care Management Coordinator  Care Plan for Problem One  Active  [facility] Long Term Goal   Patient will report progressive wound healing in the next 31 days  [goal established, follow for care coordination and progress ]  [facility] Long Term Goal Start Date  11/27/17  [facility] Long Term Goal Met Date  11/27/17  Interventions for Problem One Long Term Goal  Reviewed clinical state, wound care plan   [facility] CM Short Term Goal #1   Over the next 30 days patient will report progressive wound healing   [facility] CM Short Term Goal #1 Start Date  11/27/17  Interventions for Short Term Goal #1  Discussed with wife regarding needed supplies, reinforced to notify CM for assistance or concern regarding having appropriate supplies   [facility] CM Short Term Goal #2   Over the next 30 days patient will be able to reports increased tolerance with mobility   [facility] CM Short Term Goal #2 Start Date  11/27/17  [facility] CM Short Term Goal #2 Met Date  10/28/17  Interventions for Short Term Goal #2  Discussed with patient wife regarding encouraging patient to continue previous exericise plan provided by home health PT.   [facility] CM Short Term Goal #3  Patient will be able to report healing to wound in the next 30 days   [facility] CM Short Term Goal #3 Start Date  10/18/17 [goal date adjusted ]  [facility] CM Short Term Goal #4  Patient will not experience   a fall in the next 28 days   [facility] CM Short Term Goal #4 Start Date  10/28/17  [facility] CM Short Term Goal #4 Met Date  11/27/17      [facility] CM Care Plan Problem One     Most Recent Value  Care Plan Problem One  Impaired skin integrity related to bilateral heel wounds   Role Documenting the Problem One  Care Management Coordinator  Care Plan for Problem One  Active  [facility] Long Term Goal   Patient will report progressive wound healing in the next 31 days  [goal established, follow for care coordination and progress ]  [facility] Long Term Goal Start Date  11/27/17  [facility] Long  Term Goal Met Date  11/27/17  Interventions for Problem One Long Term Goal  Reviewed clinical state, wound care plan   [facility] CM Short Term Goal #1   Over the next 30 days patient will report progressive wound healing   [facility] CM Short Term Goal #1 Start Date  11/27/17  Interventions for Short Term Goal #1  Discussed with wife regarding needed supplies, reinforced to notify CM for assistance or concern regarding having appropriate supplies   [facility] CM Short Term Goal #2   Over the next 30 days patient will be able to reports increased tolerance with mobility   [facility] CM Short Term Goal #2 Start Date  11/27/17  [facility] CM Short Term Goal #2 Met Date  10/28/17  Interventions for Short Term Goal #2  Discussed with patient wife regarding encouraging patient to continue previous exericise plan provided by home health PT.   [facility] CM Short Term Goal #3  Patient will be able to report healing to wound in the next 30 days   [facility] CM Short Term Goal #3 Start Date  10/18/17 [goal date adjusted ]  [facility] CM Short Term Goal #4  Patient will not experience a fall in the next 28 days   [facility] CM Short Term Goal #4 Start Date  10/28/17  [facility] CM Short Term Goal #4 Met Date  11/27/17       , RN, PCCN [facility] Care Management,Care Management Coordinator  [phone]- Mobile [phone]- Toll Free Main Office  

## 2017-11-29 ENCOUNTER — Encounter: Payer: PPO | Admitting: Physician Assistant

## 2017-11-29 DIAGNOSIS — L89610 Pressure ulcer of right heel, unstageable: Secondary | ICD-10-CM | POA: Diagnosis not present

## 2017-11-29 DIAGNOSIS — L89620 Pressure ulcer of left heel, unstageable: Secondary | ICD-10-CM | POA: Diagnosis not present

## 2017-12-01 NOTE — Progress Notes (Signed)
Monds, Corwin L. ([id number]) Visit Report for 11/29/2017 Arrival Information Details Patient Name: [name], [name] Date of Service: 11/29/2017 3:15 PM Medical Record Number: [id number] Patient Account Number: [id number] Date of Birth/Sex: Mar 30, 1926 (84 y.o. M) Treating RN: Roger Shelter Primary Care Mayar Whittier: Ramonita Lab Other Clinician: Referring Donzell Coller: Ramonita Lab Treating Karmyn Lowman/Extender: Melburn Hake, HOYT Weeks in Treatment: 14 Visit Information History Since Last Visit All ordered tests and consults were completed: No Patient Arrived: [name] Added or deleted any medications: No Arrival Time: 16:11 Any new allergies or adverse reactions: No Accompanied By: wife Had a fall or experienced change in No Transfer Assistance: None activities of daily living that may affect Patient Identification Verified: Yes risk of falls: Secondary Verification Process Yes Signs or symptoms of abuse/neglect since last visito No Completed: Hospitalized since last visit: No Patient Requires Transmission-Based No Implantable device outside of the clinic excluding No Precautions: cellular tissue based products placed in the center Patient Has Alerts: Yes since last visit: Patient Alerts: Patient on Blood Pain Present Now: No Thinner Warfarin Electronic Signature(s) Signed: 11/29/2017 4:56:16 PM By: Roger Shelter Entered By: Roger Shelter on 11/29/2017 16:12:12 Pen, [place] ([id number]) -------------------------------------------------------------------------------- Encounter Discharge Information Details Patient Name: [name], [name] Date of Service: 11/29/2017 3:15 PM Medical Record Number: [id number] Patient Account Number: [id number] Date of Birth/Sex: Mar 24, 1926 (84 y.o. M) Treating RN: Roger Shelter Primary Care Raiyah Speakman: Ramonita Lab Other Clinician: Referring Sylvanus Telford: Ramonita Lab Treating Onia Shiflett/Extender: Melburn Hake, HOYT Weeks in Treatment: 14 Encounter  Discharge Information Items Discharge Condition: Stable Ambulatory Status: Walker Discharge Destination: Home Transportation: Private Auto Accompanied By: wife Schedule Follow-up Appointment: Yes Clinical Summary of Care: Electronic Signature(s) Signed: 11/29/2017 4:56:16 PM By: Roger Shelter Entered By: Roger Shelter on 11/29/2017 16:51:57 Tercero, Chandan L. ([id number]) -------------------------------------------------------------------------------- Lower Extremity Assessment Details Patient Name: [name], [name] Date of Service: 11/29/2017 3:15 PM Medical Record Number: [id number] Patient Account Number: [id number] Date of Birth/Sex: [birth year] (84 y.o. M) Treating RN: Roger Shelter Primary Care Amarrah Meinhart: Ramonita Lab Other Clinician: Referring Prabhjot Piscitello: Ramonita Lab Treating Akaila Rambo/Extender: Melburn Hake, HOYT Weeks in Treatment: 14 Edema Assessment Assessed: [Left: No] [Right: No] Edema: [Left: No] [Right: No] Vascular Assessment Claudication: Claudication Assessment [Right:None] Pulses: Dorsalis Pedis Palpable: [Left:Yes] [Right:Yes] Posterior Tibial Extremity colors, hair growth, and conditions: Extremity Color: [Left:Normal] [Right:Normal] Hair Growth on Extremity: [Left:No] [Right:No] Temperature of Extremity: [Left:Cool] [Right:Cool] Capillary Refill: [Left:< 3 seconds] [Right:< 3 seconds] Toe Nail Assessment Left: Right: Thick: Yes Yes Discolored: Yes Yes Deformed: Yes Yes Improper Length and Hygiene: Yes Yes Electronic Signature(s) Signed: 11/29/2017 4:56:16 PM By: Roger Shelter Entered By: Roger Shelter on 11/29/2017 16:21:49 [name], [name] ([id number]) -------------------------------------------------------------------------------- Multi Wound Chart Details Patient Name: [name], [name] Date of Service: 11/29/2017 3:15 PM Medical Record Number: [id number] Patient Account Number: [id number] Date of Birth/Sex: Nov 11, 1925 (84 y.o. M) Treating RN:  Roger Shelter Primary Care Bridgett Hattabaugh: Ramonita Lab Other Clinician: Referring Tamana Hatfield: Ramonita Lab Treating Arion Morgan/Extender: Melburn Hake, HOYT Weeks in Treatment: 14 Vital Signs Height(in): 70 Pulse(bpm): 67 Weight(lbs): 190 Blood Pressure(mmHg): 114/55 Body Mass Index(BMI): 27 Temperature([name]): 97.5 Respiratory Rate 16 (breaths/min): Photos: [1:No Photos] [2:No Photos] [3:No Photos] Wound Location: [1:Left Calcaneus] [2:Right Calcaneus] [3:Right Toe Great] Wounding Event: [1:Pressure Injury] [2:Pressure Injury] [3:Pressure Injury] Primary Etiology: [1:Pressure Ulcer] [2:Pressure Ulcer] [3:Pressure Ulcer] Date Acquired: [1:08/12/2017] [2:08/12/2017] [3:09/02/2017] Weeks of Treatment: [1:14] [2:14] [3:11] Wound Status: [1:Open] [2:Open] [3:Healed - Epithelialized] Measurements L x W x D [1:2.8x3.5x0.4] [2:1.5x2.2x0.2] [3:0x0x0] (cm) Area (cm) : [1:7.697] [2:2.592] [3:0] Volume (cm) : [1:3.079] [  2:0.518] [3:0] % Reduction in Area: [1:66.40%] [2:77.40%] [3:100.00%] % Reduction in Volume: [1:32.70%] [2:77.40%] [3:100.00%] Classification: [1:Unstageable/Unclassified] [2:Unstageable/Unclassified] [3:Category/Stage II] Periwound Skin Texture: [1:No Abnormalities Noted] [2:No Abnormalities Noted] [3:No Abnormalities Noted] Periwound Skin Moisture: [1:No Abnormalities Noted] [2:No Abnormalities Noted] [3:No Abnormalities Noted] Periwound Skin Color: [1:No Abnormalities Noted No] [2:No Abnormalities Noted No] [3:No Abnormalities Noted No] Treatment Notes Electronic Signature(s) Signed: 11/29/2017 4:56:16 PM By: Roger Shelter Entered By: Roger Shelter on 11/29/2017 16:33:17 Mash, [place] ([id number]) -------------------------------------------------------------------------------- [place] Details Patient Name: [name], [name] Date of Service: 11/29/2017 3:15 PM Medical Record Number: [id number] Patient Account Number: [id number] Date of Birth/Sex: 04/28/1926  (84 y.o. M) Treating RN: Roger Shelter Primary Care Elenie Coven: Ramonita Lab Other Clinician: Referring Evona Westra: Ramonita Lab Treating Spyros Winch/Extender: Melburn Hake, HOYT Weeks in Treatment: 14 Active Inactive ` Abuse / Safety / Falls / Self Care Management Nursing Diagnoses: History of Falls Potential for falls Goals: Patient will not experience any injury related to falls Date Initiated: 08/19/2017 Target Resolution Date: 12/14/2017 Goal Status: Active Interventions: Assess Activities of Daily Living upon admission and as needed Assess fall risk on admission and as needed Assess: immobility, friction, shearing, incontinence upon admission and as needed Assess impairment of mobility on admission and as needed per policy Assess personal safety and home safety (as indicated) on admission and as needed Assess self care needs on admission and as needed Notes: ` Nutrition Nursing Diagnoses: Imbalanced nutrition Potential for alteratiion in Nutrition/Potential for imbalanced nutrition Goals: Patient/caregiver agrees to and verbalizes understanding of need to use nutritional supplements and/or vitamins as prescribed Date Initiated: 08/19/2017 Target Resolution Date: 11/16/2017 Goal Status: Active Interventions: Assess patient nutrition upon admission and as needed per policy Notes: ` Orientation to the Wound Care Program Nursing Diagnoses: Mceachron, Gerry L. ([id number]) Knowledge deficit related to the wound healing center program Goals: Patient/caregiver will verbalize understanding of the [place] Date Initiated: 08/19/2017 Target Resolution Date: 09/14/2017 Goal Status: Active Interventions: Provide education on orientation to the wound center Notes: ` Pain, Acute or Chronic Nursing Diagnoses: Pain, acute or chronic: actual or potential Potential alteration in comfort, pain Goals: Patient/caregiver will verbalize adequate pain control between  visits Date Initiated: 08/19/2017 Target Resolution Date: 12/14/2017 Goal Status: Active Interventions: Complete pain assessment as per visit requirements Notes: ` Pressure Nursing Diagnoses: Knowledge deficit related to causes and risk factors for pressure ulcer development Knowledge deficit related to management of pressures ulcers Potential for impaired tissue integrity related to pressure, friction, moisture, and shear Goals: Patient will remain free from development of additional pressure ulcers Date Initiated: 08/19/2017 Target Resolution Date: 12/14/2017 Goal Status: Active Interventions: Assess: immobility, friction, shearing, incontinence upon admission and as needed Assess potential for pressure ulcer upon admission and as needed Notes: ` Wound/Skin Impairment Nursing Diagnoses: Impaired tissue integrity Knowledge deficit related to ulceration/compromised skin integrity [name], [name] ([id number]) Goals: Ulcer/skin breakdown will have a volume reduction of 80% by week 12 Date Initiated: 08/19/2017 Target Resolution Date: 12/14/2017 Goal Status: Active Interventions: Assess patient/caregiver ability to perform ulcer/skin care regimen upon admission and as needed Assess ulceration(s) every visit Notes: Electronic Signature(s) Signed: 11/29/2017 4:56:16 PM By: Roger Shelter Entered By: Roger Shelter on 11/29/2017 16:33:10 [name], [name] ([id number]) -------------------------------------------------------------------------------- Pain Assessment Details Patient Name: [name], [name] Date of Service: 11/29/2017 3:15 PM Medical Record Number: [id number] Patient Account Number: [id number] Date of Birth/Sex: 1926-05-30 (84 y.o. M) Treating RN: Roger Shelter Primary Care Errol Ala: Ramonita Lab Other Clinician: Referring Marsha Hillman: Ramonita Lab  Treating Santiago Stenzel/Extender: STONE III, HOYT Weeks in Treatment: 14 Active Problems Location of Pain Severity and  Description of Pain Patient Has Paino No Site Locations Pain Management and Medication Current Pain Management: Electronic Signature(s) Signed: 11/29/2017 4:56:16 PM By: Roger Shelter Entered By: Roger Shelter on 11/29/2017 16:12:49 [name], [name] ([id number]) -------------------------------------------------------------------------------- Patient/Caregiver Education Details Patient Name: [name], [name] Date of Service: 11/29/2017 3:15 PM Medical Record Number: [id number] Patient Account Number: [id number] Date of Birth/Gender: Dec 07, 1925 (83 y.o. M) Treating RN: Roger Shelter Primary Care Physician: Ramonita Lab Other Clinician: Referring Physician: Ramonita Lab Treating Physician/Extender: Sharalyn Ink in Treatment: 14 Education Assessment Education Provided To: Patient Education Topics Provided Wound Debridement: Handouts: Wound Debridement Methods: Explain/Verbal Responses: State content correctly Wound/Skin Impairment: Handouts: Caring for Your Ulcer Methods: Explain/Verbal Responses: State content correctly Electronic Signature(s) Signed: 11/29/2017 4:56:16 PM By: Roger Shelter Entered By: Roger Shelter on 11/29/2017 16:52:14 [name], [name] ([id number]) -------------------------------------------------------------------------------- Wound Assessment Details Patient Name: FREDIANI, Larrell L. Date of Service: 11/29/2017 3:15 PM Medical Record Number: [id number] Patient Account Number: [id number] Date of Birth/Sex: [birth year] (84 y.o. M) Treating RN: Roger Shelter Primary Care Brace Welte: Ramonita Lab Other Clinician: Referring Demitri Kucinski: Ramonita Lab Treating Mirabelle Cyphers/Extender: Melburn Hake, HOYT Weeks in Treatment: 14 Wound Status Wound Number: 1 Primary Etiology: Pressure Ulcer Wound Location: Left Calcaneus Wound Status: Open Wounding Event: Pressure Injury Date Acquired: 08/12/2017 Weeks Of Treatment: 14 Clustered Wound: No Photos Photo  Uploaded By: Roger Shelter on 11/29/2017 17:12:21 Wound Measurements Length: (cm) 2.8 Width: (cm) 3.5 Depth: (cm) 0.4 Area: (cm) 7.697 Volume: (cm) 3.079 % Reduction in Area: 66.4% % Reduction in Volume: 32.7% Wound Description Classification: Unstageable/Unclassified Periwound Skin Texture Texture Color No Abnormalities Noted: No No Abnormalities Noted: No Moisture No Abnormalities Noted: No Treatment Notes Wound #1 (Left Calcaneus) 1. Cleansed with: Clean wound with Normal Saline 2. Anesthetic Topical Lidocaine 4% cream to wound bed prior to debridement 3. Peri-wound Care: Houseman, Mikolaj L. ([id number]) Skin Prep 4. Dressing Applied: Iodoflex 5. Secondary Dressing Applied Bordered Foam Dressing Electronic Signature(s) Signed: 11/29/2017 4:56:16 PM By: Roger Shelter Entered By: Roger Shelter on 11/29/2017 16:18:41 Gayheart, Bracey ([id number]) -------------------------------------------------------------------------------- Wound Assessment Details Patient Name: LIVERS, Donald L. Date of Service: 11/29/2017 3:15 PM Medical Record Number: [id number] Patient Account Number: [id number] Date of Birth/Sex: [birth year] (84 y.o. M) Treating RN: Roger Shelter Primary Care Copelyn Widmer: Ramonita Lab Other Clinician: Referring Maikel Neisler: Ramonita Lab Treating Ethin Drummond/Extender: Melburn Hake, HOYT Weeks in Treatment: 14 Wound Status Wound Number: 2 Primary Etiology: Pressure Ulcer Wound Location: Right Calcaneus Wound Status: Open Wounding Event: Pressure Injury Date Acquired: 08/12/2017 Weeks Of Treatment: 14 Clustered Wound: No Photos Photo Uploaded By: Roger Shelter on 11/29/2017 17:12:22 Wound Measurements Length: (cm) 1.5 Width: (cm) 2.2 Depth: (cm) 0.2 Area: (cm) 2.592 Volume: (cm) 0.518 % Reduction in Area: 77.4% % Reduction in Volume: 77.4% Wound Description Classification: Unstageable/Unclassified Periwound Skin Texture Texture Color No  Abnormalities Noted: No No Abnormalities Noted: No Moisture No Abnormalities Noted: No Treatment Notes Wound #2 (Right Calcaneus) 1. Cleansed with: Clean wound with Normal Saline 2. Anesthetic Topical Lidocaine 4% cream to wound bed prior to debridement 3. Peri-wound Care: Bogdon, Chancelor L. ([id number]) Skin Prep 4. Dressing Applied: Iodoflex 5. Secondary Dressing Applied Bordered Foam Dressing Electronic Signature(s) Signed: 11/29/2017 4:56:16 PM By: Roger Shelter Entered By: Roger Shelter on 11/29/2017 16:19:43 Lourenco, [place] ([id number]) -------------------------------------------------------------------------------- Wound Assessment Details Patient Name: UPPERMAN, Dedrick L. Date of Service: 11/29/2017 3:15 PM Medical Record Number: [id number] Patient Account Number:  [id number] Date of Birth/Sex: [birth year] (84 y.o. M) Treating RN: Roger Shelter Primary Care Bionca Mckey: Ramonita Lab Other Clinician: Referring Loray Akard: Ramonita Lab Treating Keng Jewel/Extender: Melburn Hake, HOYT Weeks in Treatment: 14 Wound Status Wound Number: 3 Primary Etiology: Pressure Ulcer Wound Location: Right Toe Great Wound Status: Healed - Epithelialized Wounding Event: Pressure Injury Date Acquired: 09/02/2017 Weeks Of Treatment: 11 Clustered Wound: No Photos Photo Uploaded By: Roger Shelter on 11/29/2017 17:12:59 Wound Measurements Length: (cm) 0 Width: (cm) 0 Depth: (cm) 0 Area: (cm) 0 Volume: (cm) 0 % Reduction in Area: 100% % Reduction in Volume: 100% Wound Description Classification: Category/Stage II Periwound Skin Texture Texture Color No Abnormalities Noted: No No Abnormalities Noted: No Moisture No Abnormalities Noted: No Electronic Signature(s) Signed: 11/29/2017 4:56:16 PM By: Roger Shelter Entered By: Roger Shelter on 11/29/2017 16:19:16 Kail, Willies L. ([id number]) -------------------------------------------------------------------------------- Vitals  Details Patient Name: HEREFORD, Krishon L. Date of Service: 11/29/2017 3:15 PM Medical Record Number: [id number] Patient Account Number: [id number] Date of Birth/Sex: [birth year] (84 y.o. M) Treating RN: Roger Shelter Primary Care Catlin Doria: Ramonita Lab Other Clinician: Referring Audray Rumore: Ramonita Lab Treating Ruthann Angulo/Extender: Melburn Hake, HOYT Weeks in Treatment: 14 Vital Signs Time Taken: 16:12 Temperature ([name]): 97.5 Height (in): 70 Pulse (bpm): 67 Weight (lbs): 190 Respiratory Rate (breaths/min): 16 Body Mass Index (BMI): 27.3 Blood Pressure (mmHg): 114/55 Reference Range: 80 - 120 mg / dl Electronic Signature(s) Signed: 11/29/2017 4:56:16 PM By: Roger Shelter Entered By: Roger Shelter on 11/29/2017 16:18:15

## 2017-12-03 ENCOUNTER — Encounter: Payer: Self-pay | Admitting: *Deleted

## 2017-12-04 NOTE — Progress Notes (Signed)
Hannold, Lyam L. ([id number]) Visit Report for 11/08/2017 Arrival Information Details Patient Name: JAEDEN, MESSER. Date of Service: 11/08/2017 1:00 PM Medical Record Number: [id number] Patient Account Number: [id number] Date of Birth/Sex: [birth year] (84 y.o. M) Treating RN: Montey Hora Primary Care Blayklee Mable: Ramonita Lab Other Clinician: Referring Quindell Shere: Ramonita Lab Treating Issiah Huffaker/Extender: Melburn Hake, HOYT Weeks in Treatment: 11 Visit Information History Since Last Visit Added or deleted any medications: No Patient Arrived: Walker Any new allergies or adverse reactions: No Arrival Time: 12:58 Had a fall or experienced change in No Accompanied By: wife activities of daily living that may affect Transfer Assistance: None risk of falls: Patient Identification Verified: Yes Signs or symptoms of abuse/neglect since last visito No Secondary Verification Process Yes Hospitalized since last visit: No Completed: Implantable device outside of the clinic excluding No Patient Requires Transmission-Based No cellular tissue based products placed in the center Precautions: since last visit: Patient Has Alerts: Yes Pain Present Now: Yes Patient Alerts: Patient on Blood Thinner Warfarin Electronic Signature(s) Signed: 12/04/2017 7:45:54 AM By: Harold Barban Entered By: Harold Barban on 11/08/2017 12:59:22 Wesolowski, Alastair L. ([id number]) -------------------------------------------------------------------------------- Encounter Discharge Information Details Patient Name: SABA, NEUMAN. Date of Service: 11/08/2017 1:00 PM Medical Record Number: [id number] Patient Account Number: [id number] Date of Birth/Sex: Jun 20, 1926 (84 y.o. M) Treating RN: Montey Hora Primary Care Mordecai Tindol: Ramonita Lab Other Clinician: Referring General Wearing: Ramonita Lab Treating Lyndee Herbst/Extender: Melburn Hake, HOYT Weeks in Treatment: 11 Encounter Discharge Information Items Discharge Pain Level: 0 Discharge  Condition: Stable Ambulatory Status: Walker Discharge Destination: Home Transportation: Private Auto Accompanied By: wife Schedule Follow-up Appointment: Yes Medication Reconciliation completed and No provided to Patient/Care Tabytha Gradillas: Provided on Clinical Summary of Care: 11/08/2017 Form Type Recipient Paper Patient [facility] Electronic Signature(s) Signed: 11/08/2017 1:58:00 PM By: Alric Quan Entered By: Alric Quan on 11/08/2017 13:58:00 Stelly, Deddrick L. ([id number]) -------------------------------------------------------------------------------- Lower Extremity Assessment Details Patient Name: Aispuro, Epimenio L. Date of Service: 11/08/2017 1:00 PM Medical Record Number: [id number] Patient Account Number: [id number] Date of Birth/Sex: [birth year] (84 y.o. M) Treating RN: Montey Hora Primary Care Kwasi Joung: Ramonita Lab Other Clinician: Referring Artemio Dobie: Ramonita Lab Treating Anagha Loseke/Extender: Melburn Hake, HOYT Weeks in Treatment: 11 Vascular Assessment Pulses: Dorsalis Pedis Palpable: [Left:Yes] [Right:Yes] Posterior Tibial Palpable: [Left:No] [Right:No] Popliteal Palpable: [Left:No] [Right:No] Extremity colors, hair growth, and conditions: Hair Growth on Extremity: [Left:No] [Right:No] Temperature of Extremity: [Left:Warm] [Right:Warm] Capillary Refill: [Left:< 3 seconds] [Right:< 3 seconds] Electronic Signature(s) Signed: 11/08/2017 4:48:08 PM By: Montey Hora Signed: 12/04/2017 7:45:54 AM By: Harold Barban Entered By: Harold Barban on 11/08/2017 13:14:29 Glaus, Apolonio L. ([id number]) -------------------------------------------------------------------------------- Multi Wound Chart Details Patient Name: [place], Cainen L. Date of Service: 11/08/2017 1:00 PM Medical Record Number: [id number] Patient Account Number: [id number] Date of Birth/Sex: [birth year] (84 y.o. M) Treating RN: Montey Hora Primary Care Maleeyah Mccaughey: Ramonita Lab Other Clinician: Referring Coree Brame:  Ramonita Lab Treating Harris Kistler/Extender: Melburn Hake, HOYT Weeks in Treatment: 11 Vital Signs Height(in): 70 Pulse(bpm): 69 Weight(lbs): 190 Blood Pressure(mmHg): 106/66 Body Mass Index(BMI): 27 Temperature(F): 97.6 Respiratory Rate 16 (breaths/min): Photos: [1:No Photos] [2:No Photos] [3:No Photos] Wound Location: [1:Left Calcaneus] [2:Right Calcaneus] [3:Toe Great] Wounding Event: [1:Pressure Injury] [2:Pressure Injury] [3:Pressure Injury] Primary Etiology: [1:Pressure Ulcer] [2:Pressure Ulcer] [3:Pressure Ulcer] Comorbid History: [1:Arrhythmia, Hypertension, Received Radiation] [2:Arrhythmia, Hypertension, Received Radiation] [3:Arrhythmia, Hypertension, Received Radiation] Date Acquired: [1:08/12/2017] [2:08/12/2017] [3:09/02/2017] Weeks of Treatment: [1:11] [2:11] [3:8] Wound Status: [1:Open] [2:Open] [3:Open] Measurements L x W x D [1:3x4x0.6] [2:1.8x2.1x0.1] [3:0.1x0.1x0.1] (cm) Area (cm) : [1:9.425] [2:2.969] [3:0.008] Volume (cm) : [1:5.655] [2:0.297] [  3:0.001] % Reduction in Area: [1:58.80%] [2:74.10%] [3:95.90%] % Reduction in Volume: [1:-23.60%] [2:87.10%] [3:95.00%] Classification: [1:Unstageable/Unclassified] [2:Unstageable/Unclassified] [3:Category/Stage II] Exudate Amount: [1:Large] [2:Large] [3:None Present] Exudate Type: [1:Serosanguineous] [2:Serosanguineous] [3:N/A] Exudate Color: [1:red, brown] [2:red, brown] [3:N/A] Wound Margin: [1:Distinct, outline attached] [2:Thickened] [3:Flat and Intact] Granulation Amount: [1:Small (1-33%)] [2:None Present (0%)] [3:None Present (0%)] Granulation Quality: [1:Red, Hyper-granulation] [2:N/A] [3:N/A] Necrotic Amount: [1:Large (67-100%)] [2:Large (67-100%)] [3:None Present (0%)] Necrotic Tissue: [1:Eschar] [2:Eschar] [3:N/A] Exposed Structures: [1:Fat Layer (Subcutaneous Tissue) Exposed: Yes Fascia: No Tendon: No Muscle: No Joint: No Bone: No] [2:Fat Layer (Subcutaneous Tissue) Exposed: Yes Fascia: No Tendon: No Muscle: No  Joint: No Bone: No] [3:Fascia: No Fat Layer (Subcutaneous Tissue)  Exposed: No Tendon: No Muscle: No Joint: No Bone: No] Epithelialization: [1:None] [2:None] [3:Large (67-100%)] Periwound Skin Texture: [1:Callus: Yes] [2:No Abnormalities Noted] [3:Excoriation: No Induration: No Callus: No Crepitus: No] Rash: No Scarring: No Periwound Skin Moisture: Dry/Scaly: Yes Dry/Scaly: Yes Maceration: No Dry/Scaly: No Periwound Skin Color: No Abnormalities Noted No Abnormalities Noted Atrophie Blanche: No Cyanosis: No Ecchymosis: No Erythema: No Hemosiderin Staining: No Mottled: No Pallor: No Rubor: No Temperature: No Abnormality No Abnormality No Abnormality Tenderness on Palpation: Yes Yes No Wound Preparation: Ulcer Cleansing: Ulcer Cleansing: Ulcer Cleansing: Rinsed/Irrigated with Saline Rinsed/Irrigated with Saline Rinsed/Irrigated with Saline Topical Anesthetic Applied: Topical Anesthetic Applied: Topical Anesthetic Applied: Other: lidocaine 4% Other: lidocaine 4% None Treatment Notes Electronic Signature(s) Signed: 11/08/2017 4:48:08 PM By: Montey Hora Entered By: Montey Hora on 11/08/2017 13:27:24 Voges, Demonie L. ([id number]) -------------------------------------------------------------------------------- [place] Details Patient Name: AVERI, CACIOPPO. Date of Service: 11/08/2017 1:00 PM Medical Record Number: [id number] Patient Account Number: [id number] Date of Birth/Sex: 12-10-1925 (84 y.o. M) Treating RN: Montey Hora Primary Care Brilee Port: Ramonita Lab Other Clinician: Referring Corliss Coggeshall: Ramonita Lab Treating Amer Alcindor/Extender: Melburn Hake, HOYT Weeks in Treatment: 11 Active Inactive ` Abuse / Safety / Falls / Self Care Management Nursing Diagnoses: History of Falls Potential for falls Goals: Patient will not experience any injury related to falls Date Initiated: 08/19/2017 Target Resolution Date: 12/14/2017 Goal Status:  Active Interventions: Assess Activities of Daily Living upon admission and as needed Assess fall risk on admission and as needed Assess: immobility, friction, shearing, incontinence upon admission and as needed Assess impairment of mobility on admission and as needed per policy Assess personal safety and home safety (as indicated) on admission and as needed Assess self care needs on admission and as needed Notes: ` Nutrition Nursing Diagnoses: Imbalanced nutrition Potential for alteratiion in Nutrition/Potential for imbalanced nutrition Goals: Patient/caregiver agrees to and verbalizes understanding of need to use nutritional supplements and/or vitamins as prescribed Date Initiated: 08/19/2017 Target Resolution Date: 11/16/2017 Goal Status: Active Interventions: Assess patient nutrition upon admission and as needed per policy Notes: ` Orientation to the Wound Care Program Nursing Diagnoses: Alamillo, Zenon L. ([id number]) Knowledge deficit related to the wound healing center program Goals: Patient/caregiver will verbalize understanding of the [place] Date Initiated: 08/19/2017 Target Resolution Date: 09/14/2017 Goal Status: Active Interventions: Provide education on orientation to the wound center Notes: ` Pain, Acute or Chronic Nursing Diagnoses: Pain, acute or chronic: actual or potential Potential alteration in comfort, pain Goals: Patient/caregiver will verbalize adequate pain control between visits Date Initiated: 08/19/2017 Target Resolution Date: 12/14/2017 Goal Status: Active Interventions: Complete pain assessment as per visit requirements Notes: ` Pressure Nursing Diagnoses: Knowledge deficit related to causes and risk factors for pressure ulcer development Knowledge deficit related to management of pressures ulcers Potential for impaired tissue  integrity related to pressure, friction, moisture, and shear Goals: Patient will remain free from  development of additional pressure ulcers Date Initiated: 08/19/2017 Target Resolution Date: 12/14/2017 Goal Status: Active Interventions: Assess: immobility, friction, shearing, incontinence upon admission and as needed Assess potential for pressure ulcer upon admission and as needed Notes: ` Wound/Skin Impairment Nursing Diagnoses: Impaired tissue integrity Knowledge deficit related to ulceration/compromised skin integrity Bergen, Leeum L. ([id number]) Goals: Ulcer/skin breakdown will have a volume reduction of 80% by week 12 Date Initiated: 08/19/2017 Target Resolution Date: 12/14/2017 Goal Status: Active Interventions: Assess patient/caregiver ability to perform ulcer/skin care regimen upon admission and as needed Assess ulceration(s) every visit Notes: Electronic Signature(s) Signed: 11/08/2017 4:48:08 PM By: Montey Hora Entered By: Montey Hora on 11/08/2017 13:27:13 Unterreiner, Treyon L. ([id number]) -------------------------------------------------------------------------------- Pain Assessment Details Patient Name: MAZZOCCO, Matas L. Date of Service: 11/08/2017 1:00 PM Medical Record Number: [id number] Patient Account Number: [id number] Date of Birth/Sex: July 23, 1925 (84 y.o. M) Treating RN: Montey Hora Primary Care Jaziel Bennett: Ramonita Lab Other Clinician: Referring Bastion Bolger: Ramonita Lab Treating Kaylor Maiers/Extender: Melburn Hake, HOYT Weeks in Treatment: 11 Active Problems Location of Pain Severity and Description of Pain Patient Has Paino Yes Site Locations Pain Location: Pain in Ulcers With Dressing Change: No Rate the pain. Current Pain Level: 3 Worst Pain Level: 4 Least Pain Level: 0 Tolerable Pain Level: 4 Pain Management and Medication Current Pain Management: Electronic Signature(s) Signed: 11/08/2017 4:48:08 PM By: Montey Hora Signed: 12/04/2017 7:45:54 AM By: Harold Barban Entered By: Harold Barban on 11/08/2017 13:00:29 Goswami, Badger  ([id number]) -------------------------------------------------------------------------------- Patient/Caregiver Education Details Patient Name: MURVIN, GIFT L. Date of Service: 11/08/2017 1:00 PM Medical Record Number: [id number] Patient Account Number: [id number] Date of Birth/Gender: Jun 04, 1926 (84 y.o. M) Treating RN: Ahmed Prima Primary Care Physician: Ramonita Lab Other Clinician: Referring Physician: Ramonita Lab Treating Physician/Extender: Sharalyn Ink in Treatment: 11 Education Assessment Education Provided To: Patient Education Topics Provided Wound/Skin Impairment: Handouts: Caring for Your Ulcer, Other: change dressing as ordered Methods: Demonstration, Explain/Verbal Responses: State content correctly Electronic Signature(s) Signed: 11/08/2017 4:40:49 PM By: Alric Quan Entered By: Alric Quan on 11/08/2017 13:58:18 Waldrop, Josephmichael L. ([id number]) -------------------------------------------------------------------------------- Wound Assessment Details Patient Name: MEHRING, Raul L. Date of Service: 11/08/2017 1:00 PM Medical Record Number: [id number] Patient Account Number: [id number] Date of Birth/Sex: 07/21/1925 (84 y.o. M) Treating RN: Montey Hora Primary Care Kahlel Peake: Ramonita Lab Other Clinician: Referring Stavroula Rohde: Ramonita Lab Treating Julia Kulzer/Extender: Melburn Hake, HOYT Weeks in Treatment: 11 Wound Status Wound Number: 1 Primary Etiology: Pressure Ulcer Wound Location: Left Calcaneus Wound Status: Open Wounding Event: Pressure Injury Comorbid Arrhythmia, Hypertension, Received History: Radiation Date Acquired: 08/12/2017 Weeks Of Treatment: 11 Clustered Wound: No Photos Photo Uploaded By: Gretta Cool, BSN, RN, CWS, Kim on 11/14/2017 15:23:48 Wound Measurements Length: (cm) 3 Width: (cm) 4 Depth: (cm) 0.6 Area: (cm) 9.425 Volume: (cm) 5.655 % Reduction in Area: 58.8% % Reduction in Volume: -23.6% Epithelialization: None Tunneling:  No Undermining: No Wound Description Classification: Unstageable/Unclassified Foul Odo Wound Margin: Distinct, outline attached Slough/F Exudate Amount: Large Exudate Type: Serosanguineous Exudate Color: red, brown r After Cleansing: No ibrino Yes Wound Bed Granulation Amount: Small (1-33%) Exposed Structure Granulation Quality: Red, Hyper-granulation Fascia Exposed: No Necrotic Amount: Large (67-100%) Fat Layer (Subcutaneous Tissue) Exposed: Yes Necrotic Quality: Eschar Tendon Exposed: No Muscle Exposed: No Joint Exposed: No Bone Exposed: No Periwound Skin Texture Raffety, Tad L. ([id number]) Texture Color No Abnormalities Noted: No No Abnormalities Noted: No Callus: Yes Temperature / Pain Moisture Temperature: No Abnormality No Abnormalities  Noted: No Tenderness on Palpation: Yes Dry / Scaly: Yes Wound Preparation Ulcer Cleansing: Rinsed/Irrigated with Saline Topical Anesthetic Applied: Other: lidocaine 4%, Electronic Signature(s) Signed: 11/08/2017 4:48:08 PM By: Montey Hora Signed: 12/04/2017 7:45:54 AM By: Harold Barban Entered By: Harold Barban on 11/08/2017 13:11:12 Roa, [place] ([id number]) -------------------------------------------------------------------------------- Wound Assessment Details Patient Name: STAVER, Izekiel L. Date of Service: 11/08/2017 1:00 PM Medical Record Number: [id number] Patient Account Number: [id number] Date of Birth/Sex: 1926-05-13 (84 y.o. M) Treating RN: Montey Hora Primary Care Dyanne Yorks: Ramonita Lab Other Clinician: Referring Shinika Estelle: Ramonita Lab Treating Richard Holz/Extender: Melburn Hake, HOYT Weeks in Treatment: 11 Wound Status Wound Number: 2 Primary Etiology: Pressure Ulcer Wound Location: Right Calcaneus Wound Status: Open Wounding Event: Pressure Injury Comorbid Arrhythmia, Hypertension, Received History: Radiation Date Acquired: 08/12/2017 Weeks Of Treatment: 11 Clustered Wound: No Photos Photo Uploaded By:  Harold Barban on 11/08/2017 16:07:17 Wound Measurements Length: (cm) 1.8 Width: (cm) 2.1 Depth: (cm) 0.1 Area: (cm) 2.969 Volume: (cm) 0.297 % Reduction in Area: 74.1% % Reduction in Volume: 87.1% Epithelialization: None Tunneling: No Undermining: No Wound Description Classification: Unstageable/Unclassified Wound Margin: Thickened Exudate Amount: Large Exudate Type: Serosanguineous Exudate Color: red, brown Foul Odor After Cleansing: No Slough/Fibrino Yes Wound Bed Granulation Amount: None Present (0%) Exposed Structure Necrotic Amount: Large (67-100%) Fascia Exposed: No Necrotic Quality: Eschar Fat Layer (Subcutaneous Tissue) Exposed: Yes Tendon Exposed: No Muscle Exposed: No Joint Exposed: No Bone Exposed: No Periwound Skin Texture Handa, Derion L. ([id number]) Texture Color No Abnormalities Noted: No No Abnormalities Noted: No Moisture Temperature / Pain No Abnormalities Noted: No Temperature: No Abnormality Dry / Scaly: Yes Tenderness on Palpation: Yes Wound Preparation Ulcer Cleansing: Rinsed/Irrigated with Saline Topical Anesthetic Applied: Other: lidocaine 4%, Electronic Signature(s) Signed: 11/08/2017 4:48:08 PM By: Montey Hora Signed: 12/04/2017 7:45:54 AM By: Harold Barban Entered By: Harold Barban on 11/08/2017 13:12:40 Mcarthy, Areli L. ([id number]) -------------------------------------------------------------------------------- Wound Assessment Details Patient Name: HICKS, Bliss L. Date of Service: 11/08/2017 1:00 PM Medical Record Number: [id number] Patient Account Number: [id number] Date of Birth/Sex: 1925-10-10 (84 y.o. M) Treating RN: Montey Hora Primary Care Nasira Janusz: Ramonita Lab Other Clinician: Referring Ketura Sirek: Ramonita Lab Treating Keanthony Poole/Extender: Melburn Hake, HOYT Weeks in Treatment: 11 Wound Status Wound Number: 3 Primary Etiology: Pressure Ulcer Wound Location: Toe Great Wound Status: Open Wounding Event: Pressure  Injury Comorbid Arrhythmia, Hypertension, Received History: Radiation Date Acquired: 09/02/2017 Weeks Of Treatment: 8 Clustered Wound: No Photos Photo Uploaded By: Harold Barban on 11/08/2017 16:08:43 Wound Measurements Length: (cm) 0.1 Width: (cm) 0.1 Depth: (cm) 0.1 Area: (cm) 0.008 Volume: (cm) 0.001 % Reduction in Area: 95.9% % Reduction in Volume: 95% Epithelialization: Large (67-100%) Tunneling: No Undermining: No Wound Description Classification: Category/Stage II Foul Wound Margin: Flat and Intact Sloug Exudate Amount: None Present Odor After Cleansing: No h/Fibrino Yes Wound Bed Granulation Amount: None Present (0%) Exposed Structure Necrotic Amount: None Present (0%) Fascia Exposed: No Fat Layer (Subcutaneous Tissue) Exposed: No Tendon Exposed: No Muscle Exposed: No Joint Exposed: No Bone Exposed: No Periwound Skin Texture Texture Color No Abnormalities Noted: No No Abnormalities Noted: No Bendorf, Franklin ([id number]) Callus: No Atrophie Blanche: No Crepitus: No Cyanosis: No Excoriation: No Ecchymosis: No Induration: No Erythema: No Rash: No Hemosiderin Staining: No Scarring: No Mottled: No Pallor: No Moisture Rubor: No No Abnormalities Noted: No Dry / Scaly: No Temperature / Pain Maceration: No Temperature: No Abnormality Wound Preparation Ulcer Cleansing: Rinsed/Irrigated with Saline Topical Anesthetic Applied: None Electronic Signature(s) Signed: 11/08/2017 4:48:08 PM By: Montey Hora Signed: 12/04/2017 7:45:54 AM By:  Harold Barban Entered By: Harold Barban on 11/08/2017 13:13:25 Guinther, Terrius L. ([id number]) -------------------------------------------------------------------------------- Vitals Details Patient Name: PLANCARTE, Waylyn L. Date of Service: 11/08/2017 1:00 PM Medical Record Number: [id number] Patient Account Number: [id number] Date of Birth/Sex: 1926-01-15 (84 y.o. M) Treating RN: Montey Hora Primary Care Jonael Paradiso:  Ramonita Lab Other Clinician: Referring Zayn Selley: Ramonita Lab Treating Rober Skeels/Extender: Melburn Hake, HOYT Weeks in Treatment: 11 Vital Signs Time Taken: 13:00 Temperature (F): 97.6 Height (in): 70 Pulse (bpm): 69 Weight (lbs): 190 Respiratory Rate (breaths/min): 16 Body Mass Index (BMI): 27.3 Blood Pressure (mmHg): 106/66 Reference Range: 80 - 120 mg / dl Electronic Signature(s) Signed: 12/04/2017 7:45:54 AM By: Harold Barban Entered By: Harold Barban on 11/08/2017 13:00:50

## 2017-12-04 NOTE — Progress Notes (Signed)
[name], [name] ([id number]) Visit Report for 11/29/2017 Chief Complaint Document Details Patient Name: [name], [name] Date of Service: 11/29/2017 3:15 PM Medical Record Number: [id number] Patient Account Number: [id number] Date of Birth/Sex: 10-25-1925 (84 y.o. M) Treating RN: Montey Hora Primary Care Provider: Ramonita Lab Other Clinician: Referring Provider: Ramonita Lab Treating Provider/Extender: Melburn Hake, Wajiha Versteeg Weeks in Treatment: 14 Information Obtained from: Patient Chief Complaint Bilateral Heel pressure ulcers and bilateral great toe DTIs Electronic Signature(s) Signed: 11/29/2017 9:42:33 PM By: Worthy Keeler PA-C Entered By: Worthy Keeler on 11/29/2017 15:36:10 Lubin, Lenzy L. ([id number]) -------------------------------------------------------------------------------- Debridement Details Patient Name: [name], [name] Date of Service: 11/29/2017 3:15 PM Medical Record Number: [id number] Patient Account Number: [id number] Date of Birth/Sex: 02/08/1926 (84 y.o. M) Treating RN: Roger Shelter Primary Care Provider: Ramonita Lab Other Clinician: Referring Provider: Ramonita Lab Treating Provider/Extender: Melburn Hake, Edinson Domeier Weeks in Treatment: 14 Debridement Performed for Wound #2 Right Calcaneus Assessment: Performed By: Physician STONE III, Adrain Nesbit E., PA-C Debridement Type: Debridement Pre-procedure Verification/Time Yes - 16:33 Out Taken: Start Time: 16:33 Pain Control: Other : lidocaine 4% Total Area Debrided (L x W): 1.5 (cm) x 2.2 (cm) = 3.3 (cm) Tissue and other material Viable, Non-Viable, Slough, Subcutaneous, Biofilm, Slough debrided: Level: Skin/Subcutaneous Tissue Debridement Description: Excisional Instrument: Curette Bleeding: Moderate Hemostasis Achieved: Pressure End Time: 16:35 Procedural Pain: 0 Post Procedural Pain: 0 Response to Treatment: Procedure was tolerated well Level of Consciousness: Awake and Alert Post Procedure  Vitals: Temperature: 97.5 Pulse: 67 Respiratory Rate: 16 Blood Pressure: Systolic Blood Pressure: 629 Diastolic Blood Pressure: 55 Post Debridement Measurements of Total Wound Length: (cm) 1.5 Stage: Unstageable/Unclassified Width: (cm) 2.2 Depth: (cm) 0.2 Volume: (cm) 0.518 Character of Wound/Ulcer Post Stable Debridement: Post Procedure Diagnosis Same as Pre-procedure Electronic Signature(s) Signed: 11/29/2017 4:56:16 PM By: Roger Shelter Signed: 11/29/2017 9:42:33 PM By: Worthy Keeler PA-C Entered By: Roger Shelter on 11/29/2017 16:35:25 Rodas, Bayani L. ([id number]) Swicegood, [place] ([id number]) -------------------------------------------------------------------------------- Debridement Details Patient Name: [name], [name] Date of Service: 11/29/2017 3:15 PM Medical Record Number: [id number] Patient Account Number: [id number] Date of Birth/Sex: Jun 23, 1926 (84 y.o. M) Treating RN: Roger Shelter Primary Care Provider: Ramonita Lab Other Clinician: Referring Provider: Ramonita Lab Treating Provider/Extender: Melburn Hake, Icesis Renn Weeks in Treatment: 14 Debridement Performed for Wound #1 Left Calcaneus Assessment: Performed By: Physician STONE III, Jodelle Fausto E., PA-C Debridement Type: Debridement Pre-procedure Verification/Time Yes - 16:33 Out Taken: Start Time: 16:33 Pain Control: Other : lidocaine 4% Total Area Debrided (L x W): 2.8 (cm) x 3.5 (cm) = 9.8 (cm) Tissue and other material Viable, Non-Viable, Slough, Subcutaneous, Biofilm, Slough debrided: Level: Skin/Subcutaneous Tissue Debridement Description: Excisional Instrument: Curette Bleeding: Moderate Hemostasis Achieved: Pressure End Time: 16:35 Procedural Pain: 0 Post Procedural Pain: 0 Response to Treatment: Procedure was tolerated well Level of Consciousness: Awake and Alert Post Procedure Vitals: Temperature: 97.5 Pulse: 67 Respiratory Rate: 16 Blood Pressure: Systolic Blood Pressure:  595 Diastolic Blood Pressure: 55 Post Debridement Measurements of Total Wound Length: (cm) 2.8 Stage: Unstageable/Unclassified Width: (cm) 3.5 Depth: (cm) 0.4 Volume: (cm) 3.079 Character of Wound/Ulcer Post Stable Debridement: Post Procedure Diagnosis Same as Pre-procedure Electronic Signature(s) Signed: 11/29/2017 4:56:16 PM By: Roger Shelter Signed: 11/29/2017 9:42:33 PM By: Worthy Keeler PA-C Entered By: Roger Shelter on 11/29/2017 16:36:21 [place], Ashur L. ([id number]) Curvin, Templeton ([id number]) -------------------------------------------------------------------------------- HPI Details Patient Name: [name], [name] Date of Service: 11/29/2017 3:15 PM Medical Record Number: [id number] Patient Account Number: [id number] Date of Birth/Sex: [birth year] (84 y.o. M) Treating  RN: Montey Hora Primary Care Provider: Ramonita Lab Other Clinician: Referring Provider: Ramonita Lab Treating Provider/Extender: Melburn Hake, Sherrilyn Nairn Weeks in Treatment: 14 History of Present Illness Associated Signs and Symptoms: Patient has a history of chronic atrophic relation, hypertension, and long-term use of anticoagulants. He also had a fall with pelvic fracture which occurred on January 2019. HPI Description: 08/19/17 patient presents today for initial evaluation and our clinic concerning issues he has been having with the bilateral heels as well as the lateral great toe was since he was admitted to the nursing facility following a pelvic fracture. Initially he was spending a significant amount of time in the bed although he is now up more often since the healing process has progressed along the way obviously. When he is up he's spending most of the time sitting in a wheelchair at this point. When he is in bed he lays on his back and the covers/sheets are pushing down on his toes bilaterally which I think it may be what's causing the issues with his deep tissue injury to the bilateral great  toes. With that being said the hills also appear to be pressure in nature and I think that laying in the bed getting pressure to the sites is what has led to the formation of these on stage will pressure ulcers. Fortunately there does not appear to be any infection in both areas of ulceration of the heel appear to be stable at this point. Patient does not have any significant pain at the site she does have some of the eschar along the edges which is starting to lift up and come all hopefully slowly but surely this will occur. Patient did have arterial studies performed April 2018 which showed a normal TBI on the right knee slightly depressed TBI on the left but this does not appear to have changed significantly at that point. Specifically this was 0.75 on the right and 0.61 on the left. 09/13/17 on evaluation today patient presents for a follow-up evaluation regarding his bilateral heal ulcers. He has been tolerating the dressing changes fairly well although he did have some bleeding earlier today when it appears some of the skin on the surrounding portion of the wound got pulled where this is starting to flake off. Subsequently there does not appear to be any bleeding right now. Since I last saw him he has been discharged from the facility and is now back at home. His wife is having a very difficult time being able to get him up and in the car in order to come for an appointment which is why they missed the last appointment have not been able to come back sooner. Fortunately the he does not have any signs of infection. No fevers chills noted. He does have difficulty walking due to pain in the bilateral heels. 10/11/17 on evaluation today patient presents for follow-up evaluation concerning his ongoing bilateral heal ulcers. He has been tolerating the dressing changes which were Betadine paints daily without complication. I have not seen him since September 13, 2017. Pressure that I saw him August 19, 2017. Subsequently those are the only prior visits before today. His left heel actually seems to have a significant amount of eschar that is starting to lift up at this point and seems to be a little more loose as far as drainage building up underneath the eschar. The right eschar though there are some parts lifting up seems to still be fairly stable which is good news. His  toe ulcer is almost completely healed a lot of that eschar came off and it looks very well underneath. 10/25/17 on evaluation today patient presents today regarding his bilateral heal ulcers in his right great toe ulcer. At this point his heels actually have loosened and softened to the point that I think we do need to remove these at this time. With that being said I think this will actually help them to heal much better and much more quickly currently. With that being said patient was a little bit worried about the debridement as far as pain was concerned fortunately this seemed to go very well today. 11/08/17 evaluation today patient's hills actually appear to be doing better compared to last 2 week's evaluation. The right heel especially is doing great and show signs of excellent granulation at this time. With that being said the left heel is much deeper and I was able to clean away a lot of the slough and subcutaneous tissue at this point he still has some noted that will take time to clear away. With that being said he has been tolerating the dressing changes will complication the doesn't of been some issues with getting the proper supplies I'm not exactly sure what the issue is there however we are to contact home health and try to figure this out. In the meantime fortunately he does not seem to have any significant pain on the right he does have some pain walking on the left which makes sense. 11/15/17 on evaluation today patient appears to be doing well at this point in time in regard to his bilateral heal ulcers. He  does Yoak, Fayez L. ([id number]) still have slough noted over both especially the left although both seem to be making signs of improvement. Currently there is no evidence of infection he does have some discomfort on the left really nothing on the right. 11/22/17 on evaluation today patient appears to be doing very well in regard to his heels which are definitely making progress. This has been slow but nonetheless is definitely progressing. Fortunately there is no evidence of infection No fevers, chills, nausea, or vomiting noted at this time. We are still having issues with supplies and getting the Iodoflex for the patient at home. 11/29/17 on evaluation today patient's bilateral heels appear to be doing better. Especially the right heel which is making excellent progress and doing very well. Fortunately he shows no signs of infection which is great news. Overall I do feel like he is improving. Of note home health has been canceled at this point and now some supplies are being ordered from an outside company according to the patient and his wife. With that being said I'm not sure the company and I'm not sure when they may actually receive the supplies. Nonetheless I still think the Iodoflex has been beneficial for him in the interim when she runs out of the Iodoflex his wife is using Santyl which is also good in my pinion. Electronic Signature(s) Signed: 11/29/2017 9:42:33 PM By: Worthy Keeler PA-C Entered By: Worthy Keeler on 11/29/2017 21:34:52 Lines, [place] ([id number]) -------------------------------------------------------------------------------- Physical Exam Details Patient Name: Mccuen, Dominic L. Date of Service: 11/29/2017 3:15 PM Medical Record Number: [id number] Patient Account Number: [id number] Date of Birth/Sex: [birth year] (84 y.o. M) Treating RN: Montey Hora Primary Care Provider: Ramonita Lab Other Clinician: Referring Provider: Ramonita Lab Treating Provider/Extender:  Melburn Hake, Isobelle Tuckett Weeks in Treatment: 37 Constitutional Well-nourished and well-hydrated in no acute distress. Respiratory normal breathing  without difficulty. clear to auscultation bilaterally. Cardiovascular regular rate and rhythm with normal S1, S2. Psychiatric this patient is able to make decisions and demonstrates good insight into disease process. Alert and Oriented x 3. pleasant and cooperative. Notes Patient's wounds both did have slough noted on the surface of the wound which required sharp debridement today he tolerated this without complication post debridement wound bed appears to be doing much better in both cases. Overall I'm very pleased with the progress he seems to be making. Electronic Signature(s) Signed: 11/29/2017 9:42:33 PM By: Worthy Keeler PA-C Entered By: Worthy Keeler on 11/29/2017 21:35:43 [place], Deontra L. ([id number]) -------------------------------------------------------------------------------- Physician Orders Details Patient Name: LYRIQ, JARCHOW L. Date of Service: 11/29/2017 3:15 PM Medical Record Number: [id number] Patient Account Number: [id number] Date of Birth/Sex: 1926-06-24 (84 y.o. M) Treating RN: Roger Shelter Primary Care Provider: Ramonita Lab Other Clinician: Referring Provider: Ramonita Lab Treating Provider/Extender: Melburn Hake, Abdel Effinger Weeks in Treatment: 71 Verbal / Phone Orders: No Diagnosis Coding ICD-10 Coding Code Description L89.620 Pressure ulcer of left heel, unstageable L89.610 Pressure ulcer of right heel, unstageable I48.2 Chronic atrial fibrillation I10 Essential (primary) hypertension Z79.01 Long term (current) use of anticoagulants Wound Cleansing Wound #1 Left Calcaneus o Clean wound with Normal Saline. o Cleanse wound with mild soap and water o May Shower, gently pat wound dry prior to applying new dressing. Wound #2 Right Calcaneus o Clean wound with Normal Saline. o Cleanse wound with mild soap and  water o May Shower, gently pat wound dry prior to applying new dressing. Anesthetic (add to Medication List) Wound #1 Left Calcaneus o Topical Lidocaine 4% cream applied to wound bed prior to debridement (In Clinic Only). Wound #2 Right Calcaneus o Topical Lidocaine 4% cream applied to wound bed prior to debridement (In Clinic Only). Skin Barriers/Peri-Wound Care Wound #1 Left Calcaneus o Skin Prep Wound #2 Right Calcaneus o Skin Prep Primary Wound Dressing Wound #1 Left Calcaneus o Iodoflex Wound #2 Right Calcaneus o Iodoflex Secondary Dressing Wound #1 Left Calcaneus Kilner, Beniah L. ([id number]) o Dry Gauze o Foam Wound #2 Right Calcaneus o Dry Gauze o Foam Dressing Change Frequency Wound #1 Left Calcaneus o Change dressing every day. o Change dressing twice daily. - morning and evening Wound #2 Right Calcaneus o Change dressing every day. o Change dressing twice daily. - morning and evening Follow-up Appointments Wound #1 Left Calcaneus o Return Appointment in 1 week. Wound #2 Right Calcaneus o Return Appointment in 1 week. Off-Loading Wound #1 Left Calcaneus o Heel suspension boot to: - Please order prevalon boots for bilateral feet Please float heel when pt is in the bed (NO PRESSURE ON HEELS) o Turn and reposition every 2 hours Wound #2 Right Calcaneus o Heel suspension boot to: - Please order prevalon boots for bilateral feet Please float heel when pt is in the bed (NO PRESSURE ON HEELS) o Turn and reposition every 2 hours Additional Orders / Instructions Wound #1 Left Calcaneus o Increase protein intake. Wound #2 Right Calcaneus o Increase protein intake. Home Health Wound #1 Left [place] Nurse may visit PRN to address patientos wound care needs. o FACE TO FACE ENCOUNTER: MEDICARE and MEDICAID PATIENTS: I certify that this patient is under my care and that  I had a face-to-face encounter that meets the physician face-to-face encounter requirements with this patient on this date. The encounter with the patient was in whole or in part for the following MEDICAL  CONDITION: (primary reason for Home Healthcare) MEDICAL NECESSITY: I certify, that based on my findings, NURSING services are a medically necessary home health service. HOME BOUND STATUS: I certify that my clinical findings support that this patient is homebound (i.e., Due to illness or injury, pt requires aid of supportive devices such as crutches, cane, wheelchairs, walkers, the use of special transportation or the assistance of another person to leave their place of residence. There is a normal inability to leave the home and doing so requires considerable and taxing effort. Other absences are for medical reasons / religious services and are infrequent or of short duration when for other reasons). Regis, Abbas L. ([id number]) o If current dressing causes regression in wound condition, may D/C ordered dressing product/s and apply Normal Saline Moist Dressing daily until next [place] / Other MD appointment. [place] of regression in wound condition at [phone]. o Please direct any NON-WOUND related issues/requests for orders to patient's Primary Care Physician Wound #2 Right [place] Nurse may visit PRN to address patientos wound care needs. o FACE TO FACE ENCOUNTER: MEDICARE and MEDICAID PATIENTS: I certify that this patient is under my care and that I had a face-to-face encounter that meets the physician face-to-face encounter requirements with this patient on this date. The encounter with the patient was in whole or in part for the following MEDICAL CONDITION: (primary reason for [place]) MEDICAL NECESSITY: I certify, that based on my findings, NURSING services are a medically necessary home  health service. HOME BOUND STATUS: I certify that my clinical findings support that this patient is homebound (i.e., Due to illness or injury, pt requires aid of supportive devices such as crutches, cane, wheelchairs, walkers, the use of special transportation or the assistance of another person to leave their place of residence. There is a normal inability to leave the home and doing so requires considerable and taxing effort. Other absences are for medical reasons / religious services and are infrequent or of short duration when for other reasons). o If current dressing causes regression in wound condition, may D/C ordered dressing product/s and apply Normal Saline Moist Dressing daily until next [place] / Other MD appointment. [place] of regression in wound condition at [phone]. o Please direct any NON-WOUND related issues/requests for orders to patient's Primary Care Physician Electronic Signature(s) Signed: 11/29/2017 4:56:16 PM By: Roger Shelter Signed: 11/29/2017 9:42:33 PM By: Worthy Keeler PA-C Entered By: Roger Shelter on 11/29/2017 16:39:04 [place], Nihal L. ([id number]) -------------------------------------------------------------------------------- Problem List Details Patient Name: ACY, Braun L. Date of Service: 11/29/2017 3:15 PM Medical Record Number: [id number] Patient Account Number: [id number] Date of Birth/Sex: April 27, 1926 (84 y.o. M) Treating RN: Montey Hora Primary Care Provider: Ramonita Lab Other Clinician: Referring Provider: Ramonita Lab Treating Provider/Extender: Melburn Hake, Ceil Roderick Weeks in Treatment: 14 Active Problems ICD-10 Impacting Encounter Code Description Active Date Wound Healing Diagnosis L89.620 Pressure ulcer of left heel, unstageable 08/19/2017 Yes L89.610 Pressure ulcer of right heel, unstageable 08/19/2017 Yes I48.2 Chronic atrial fibrillation 08/19/2017 Yes I10 Essential (primary) hypertension  08/19/2017 Yes Z79.01 Long term (current) use of anticoagulants 08/19/2017 Yes Inactive Problems Resolved Problems Electronic Signature(s) Signed: 11/29/2017 9:42:33 PM By: Worthy Keeler PA-C Entered By: Worthy Keeler on 11/29/2017 15:36:02 [place], [place] ([id number]) -------------------------------------------------------------------------------- Progress Note Details Patient Name: Merkin, Finnlee L. Date of Service: 11/29/2017 3:15 PM Medical Record Number: [id number] Patient Account Number: [id number] Date of Birth/Sex:  06/13/1926 (84 y.o. M) Treating RN: Montey Hora Primary Care Provider: Ramonita Lab Other Clinician: Referring Provider: Ramonita Lab Treating Provider/Extender: Melburn Hake, Lavona Norsworthy Weeks in Treatment: 14 Subjective Chief Complaint Information obtained from Patient Bilateral Heel pressure ulcers and bilateral great toe DTIs History of Present Illness (HPI) The following HPI elements were documented for the patient's wound: Associated Signs and Symptoms: Patient has a history of chronic atrophic relation, hypertension, and long-term use of anticoagulants. He also had a fall with pelvic fracture which occurred on January 2019. 08/19/17 patient presents today for initial evaluation and our clinic concerning issues he has been having with the bilateral heels as well as the lateral great toe was since he was admitted to the nursing facility following a pelvic fracture. Initially he was spending a significant amount of time in the bed although he is now up more often since the healing process has progressed along the way obviously. When he is up he's spending most of the time sitting in a wheelchair at this point. When he is in bed he lays on his back and the covers/sheets are pushing down on his toes bilaterally which I think it may be what's causing the issues with his deep tissue injury to the bilateral great toes. With that being said the hills also appear to be pressure  in nature and I think that laying in the bed getting pressure to the sites is what has led to the formation of these on stage will pressure ulcers. Fortunately there does not appear to be any infection in both areas of ulceration of the heel appear to be stable at this point. Patient does not have any significant pain at the site she does have some of the eschar along the edges which is starting to lift up and come all hopefully slowly but surely this will occur. Patient did have arterial studies performed April 2018 which showed a normal TBI on the right knee slightly depressed TBI on the left but this does not appear to have changed significantly at that point. Specifically this was 0.75 on the right and 0.61 on the left. 09/13/17 on evaluation today patient presents for a follow-up evaluation regarding his bilateral heal ulcers. He has been tolerating the dressing changes fairly well although he did have some bleeding earlier today when it appears some of the skin on the surrounding portion of the wound got pulled where this is starting to flake off. Subsequently there does not appear to be any bleeding right now. Since I last saw him he has been discharged from the facility and is now back at home. His wife is having a very difficult time being able to get him up and in the car in order to come for an appointment which is why they missed the last appointment have not been able to come back sooner. Fortunately the he does not have any signs of infection. No fevers chills noted. He does have difficulty walking due to pain in the bilateral heels. 10/11/17 on evaluation today patient presents for follow-up evaluation concerning his ongoing bilateral heal ulcers. He has been tolerating the dressing changes which were Betadine paints daily without complication. I have not seen him since September 13, 2017. Pressure that I saw him August 19, 2017. Subsequently those are the only prior visits before today. His  left heel actually seems to have a significant amount of eschar that is starting to lift up at this point and seems to be a little more loose  as far as drainage building up underneath the eschar. The right eschar though there are some parts lifting up seems to still be fairly stable which is good news. His toe ulcer is almost completely healed a lot of that eschar came off and it looks very well underneath. 10/25/17 on evaluation today patient presents today regarding his bilateral heal ulcers in his right great toe ulcer. At this point his heels actually have loosened and softened to the point that I think we do need to remove these at this time. With that being said I think this will actually help them to heal much better and much more quickly currently. With that being said patient was a little bit worried about the debridement as far as pain was concerned fortunately this seemed to go very well today. 11/08/17 evaluation today patient's hills actually appear to be doing better compared to last 2 week's evaluation. The right heel Cloe, [place] ([id number]) especially is doing great and show signs of excellent granulation at this time. With that being said the left heel is much deeper and I was able to clean away a lot of the slough and subcutaneous tissue at this point he still has some noted that will take time to clear away. With that being said he has been tolerating the dressing changes will complication the doesn't of been some issues with getting the proper supplies I'm not exactly sure what the issue is there however we are to contact home health and try to figure this out. In the meantime fortunately he does not seem to have any significant pain on the right he does have some pain walking on the left which makes sense. 11/15/17 on evaluation today patient appears to be doing well at this point in time in regard to his bilateral heal ulcers. He does still have slough noted over both  especially the left although both seem to be making signs of improvement. Currently there is no evidence of infection he does have some discomfort on the left really nothing on the right. 11/22/17 on evaluation today patient appears to be doing very well in regard to his heels which are definitely making progress. This has been slow but nonetheless is definitely progressing. Fortunately there is no evidence of infection No fevers, chills, nausea, or vomiting noted at this time. We are still having issues with supplies and getting the Iodoflex for the patient at home. 11/29/17 on evaluation today patient's bilateral heels appear to be doing better. Especially the right heel which is making excellent progress and doing very well. Fortunately he shows no signs of infection which is great news. Overall I do feel like he is improving. Of note home health has been canceled at this point and now some supplies are being ordered from an outside company according to the patient and his wife. With that being said I'm not sure the company and I'm not sure when they may actually receive the supplies. Nonetheless I still think the Iodoflex has been beneficial for him in the interim when she runs out of the Iodoflex his wife is using Santyl which is also good in my pinion. Patient History Information obtained from Patient. Family History No family history of Cancer, Diabetes, Heart Disease, Hereditary Spherocytosis, Hypertension, Kidney Disease, Lung Disease, Seizures, Stroke, Thyroid Problems, Tuberculosis. Social History Former smoker, Marital Status - Married, Alcohol Use - Rarely - 2oz day, Drug Use - No History, Caffeine Use - Daily. Review of Systems (ROS) Constitutional Symptoms (General  Health) Denies complaints or symptoms of Fever, Chills. Respiratory The patient has no complaints or symptoms. Cardiovascular The patient has no complaints or symptoms. Psychiatric The patient has no complaints or  symptoms. Objective Constitutional Well-nourished and well-hydrated in no acute distress. Vitals Time Taken: 4:12 PM, Height: 70 in, Weight: 190 lbs, BMI: 27.3, Temperature: 97.5 F, Pulse: 67 bpm, Respiratory Rate: 16 breaths/min, Blood Pressure: 114/55 mmHg. Kutzer, Maeson L. ([id number]) Respiratory normal breathing without difficulty. clear to auscultation bilaterally. Cardiovascular regular rate and rhythm with normal S1, S2. Psychiatric this patient is able to make decisions and demonstrates good insight into disease process. Alert and Oriented x 3. pleasant and cooperative. General Notes: Patient's wounds both did have slough noted on the surface of the wound which required sharp debridement today he tolerated this without complication post debridement wound bed appears to be doing much better in both cases. Overall I'm very pleased with the progress he seems to be making. Integumentary (Hair, Skin) Wound #1 status is Open. Original cause of wound was Pressure Injury. The wound is located on the Left Calcaneus. The wound measures 2.8cm length x 3.5cm width x 0.4cm depth; 7.697cm^2 area and 3.079cm^3 volume. Wound #2 status is Open. Original cause of wound was Pressure Injury. The wound is located on the Right Calcaneus. The wound measures 1.5cm length x 2.2cm width x 0.2cm depth; 2.592cm^2 area and 0.518cm^3 volume. Wound #3 status is Healed - Epithelialized. Original cause of wound was Pressure Injury. The wound is located on the Right Toe Great. The wound measures 0cm length x 0cm width x 0cm depth; 0cm^2 area and 0cm^3 volume. 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 There was a Excisional Skin/Subcutaneous Tissue Debridement with a total area of 9.8 sq cm performed by STONE III, Harlis Champoux E., PA-C. With the following instrument(s): Curette to remove Viable and Non-Viable tissue/material. Material removed includes Subcutaneous Tissue, Slough, and Biofilm after achieving pain control using Other (lidocaine 4%). No specimens were taken. A time out was conducted at 16:33, prior to the start of the procedure. A Moderate amount of bleeding was controlled with Pressure. The procedure was tolerated well with a pain level of 0 throughout and a pain level of 0 following the procedure. Patient s Level of Consciousness post procedure was recorded as Awake and Alert and post-procedure vitals were taken including Temperature: 97.5 F, Pulse: 67 bpm, Respiratory Rate: 16 breaths/min, Blood Pressure: (114)/(55) mmHg. Post Debridement Measurements: 2.8cm length x 3.5cm width x 0.4cm depth; 3.079cm^3 volume. Post debridement Stage noted as Weist, Nabor L. ([id number]) Unstageable/Unclassified. Character of Wound/Ulcer Post Debridement is stable. Post procedure Diagnosis Wound #1: Same as Pre-Procedure Wound #2 Pre-procedure diagnosis of Wound #2 is a Pressure Ulcer located on the Right Calcaneus . There was a Excisional Skin/Subcutaneous Tissue Debridement with a total area of 3.3 sq cm performed by STONE III, Chelsye Suhre E., PA-C. With the following instrument(s): Curette to remove Viable and Non-Viable tissue/material. Material removed includes Subcutaneous Tissue, Slough, and Biofilm after achieving pain control using Other (lidocaine 4%). No specimens were taken. A time out was conducted at 16:33, prior to the start of the procedure. A Moderate amount of bleeding was controlled with Pressure. The procedure was tolerated  well with a pain level of 0 throughout and a pain level of 0 following the procedure. Patient s Level of Consciousness post procedure was  recorded as Awake and Alert and post-procedure vitals were taken including Temperature: 97.5 F, Pulse: 67 bpm, Respiratory Rate: 16 breaths/min, Blood Pressure: (114)/(55) mmHg. Post Debridement Measurements: 1.5cm length x 2.2cm width x 0.2cm depth; 0.518cm^3 volume. Post debridement Stage noted as Unstageable/Unclassified. Character of Wound/Ulcer Post Debridement is stable. Post procedure Diagnosis Wound #2: Same as Pre-Procedure Plan Wound Cleansing: Wound #1 Left Calcaneus: Clean wound with Normal Saline. Cleanse wound with mild soap and water May Shower, gently pat wound dry prior to applying new dressing. Wound #2 Right Calcaneus: Clean wound with Normal Saline. Cleanse wound with mild soap and water May Shower, gently pat wound dry prior to applying new dressing. Anesthetic (add to Medication List): Wound #1 Left Calcaneus: Topical Lidocaine 4% cream applied to wound bed prior to debridement (In Clinic Only). Wound #2 Right Calcaneus: Topical Lidocaine 4% cream applied to wound bed prior to debridement (In Clinic Only). Skin Barriers/Peri-Wound Care: Wound #1 Left Calcaneus: Skin Prep Wound #2 Right Calcaneus: Skin Prep Primary Wound Dressing: Wound #1 Left Calcaneus: Iodoflex Wound #2 Right Calcaneus: Iodoflex Secondary Dressing: Wound #1 Left Calcaneus: Dry Gauze Foam Wound #2 Right Calcaneus: Dry Gauze Foam Dressing Change Frequency: Wound #1 Left Calcaneus: [place], [place] ([id number]) Change dressing every day. Change dressing twice daily. - morning and evening Wound #2 Right Calcaneus: Change dressing every day. Change dressing twice daily. - morning and evening Follow-up Appointments: Wound #1 Left Calcaneus: Return Appointment in 1 week. Wound #2 Right Calcaneus: Return Appointment in 1 week. Off-Loading: Wound #1 Left Calcaneus: Heel suspension boot to: - Please order prevalon boots for bilateral feet Please float heel when pt is in the bed  (NO PRESSURE ON HEELS) Turn and reposition every 2 hours Wound #2 Right Calcaneus: Heel suspension boot to: - Please order prevalon boots for bilateral feet Please float heel when pt is in the bed (NO PRESSURE ON HEELS) Turn and reposition every 2 hours Additional Orders / Instructions: Wound #1 Left Calcaneus: Increase protein intake. Wound #2 Right Calcaneus: Increase protein intake. Home Health: Wound #1 Left Calcaneus: [place] Nurse may visit PRN to address patient s wound care needs. FACE TO FACE ENCOUNTER: MEDICARE and MEDICAID PATIENTS: I certify that this patient is under my care and that I had a face-to-face encounter that meets the physician face-to-face encounter requirements with this patient on this date. The encounter with the patient was in whole or in part for the following MEDICAL CONDITION: (primary reason for [place]) MEDICAL NECESSITY: I certify, that based on my findings, NURSING services are a medically necessary home health service. HOME BOUND STATUS: I certify that my clinical findings support that this patient is homebound (i.e., Due to illness or injury, pt requires aid of supportive devices such as crutches, cane, wheelchairs, walkers, the use of special transportation or the assistance of another person to leave their place of residence. There is a normal inability to leave the home and doing so requires considerable and taxing effort. Other absences are for medical reasons / religious services and are infrequent or of short duration when for other reasons). If current dressing causes regression in wound condition, may D/C ordered dressing product/s and apply Normal Saline Moist Dressing daily until next [place] / Other MD appointment. [place] of regression in wound condition  at [phone]. Please direct any NON-WOUND related issues/requests for orders to patient's Primary Care  Physician Wound #2 Right Calcaneus: [place] Nurse may visit PRN to address patient s wound care needs. FACE TO FACE ENCOUNTER: MEDICARE and MEDICAID PATIENTS: I certify that this patient is under my care and that I had a face-to-face encounter that meets the physician face-to-face encounter requirements with this patient on this date. The encounter with the patient was in whole or in part for the following MEDICAL CONDITION: (primary reason for [place]) MEDICAL NECESSITY: I certify, that based on my findings, NURSING services are a medically necessary home health service. HOME BOUND STATUS: I certify that my clinical findings support that this patient is homebound (i.e., Due to illness or injury, pt requires aid of supportive devices such as crutches, cane, wheelchairs, walkers, the use of special transportation or the assistance of another person to leave their place of residence. There is a normal inability to leave the home and doing so requires considerable and taxing effort. Other absences are for medical reasons / religious services and are infrequent or of short duration when for other reasons). If current dressing causes regression in wound condition, may D/C ordered dressing product/s and apply Normal Saline Moist Dressing daily until next [place] / Other MD appointment. [place] of regression in wound condition at [phone]. Please direct any NON-WOUND related issues/requests for orders to patient's Primary Care Physician [place], [place] ([id number]) I am going to suggest that we continue with the Iodoflex at this point. Patient's wife is in agreement with this plan. We will see were things stand in one weeks time when we see him for reevaluation. Hopefully soon after everything gets clean and we're just waiting for the healing process to run its course we may be able to go to every other week follow-up visits  but for now I would remain with weekly visits. Please see above for specific wound care orders. We will see patient for re-evaluation in 1 week(s) here in the clinic. If anything worsens or changes patient will contact our office for additional recommendations. Electronic Signature(s) Signed: 11/29/2017 9:42:33 PM By: Worthy Keeler PA-C Entered By: Worthy Keeler on 11/29/2017 21:35:58 Mcclatchy, Tyberius L. ([id number]) -------------------------------------------------------------------------------- ROS/PFSH Details Patient Name: BAS, Javar L. Date of Service: 11/29/2017 3:15 PM Medical Record Number: [id number] Patient Account Number: [id number] Date of Birth/Sex: 12/18/1925 (84 y.o. M) Treating RN: Montey Hora Primary Care Provider: Ramonita Lab Other Clinician: Referring Provider: Ramonita [facility] Treating Provider/Extender: Melburn Hake, Elis Sauber Weeks in Treatment: 14 Information Obtained From Patient Wound History Do you currently have one or more open woundso Yes How many open wounds do you currently haveo 2 Approximately how long have you had your woundso 1 week How have you been treating your wound(s) until nowo nothing Has your wound(s) ever healed and then re-openedo No Have you had any lab work done in the past montho No Have you tested positive for osteomyelitis (bone infection)o No Have you had any tests for circulation on your legso No Have you had other problems associated with your woundso Swelling Constitutional Symptoms (General Health) Complaints and Symptoms: Negative for: Fever; Chills Respiratory Complaints and Symptoms: No Complaints or Symptoms Cardiovascular Complaints and Symptoms: No Complaints or Symptoms Medical History: Positive for: Arrhythmia - a-fib; Hypertension Oncologic Medical History: Positive for: Received Radiation Psychiatric Complaints and Symptoms: No Complaints or Symptoms Immunizations Pneumococcal Vaccine: Received Pneumococcal  Vaccination: Yes Implantable  Devices Family and Social History Pehrson, [place] ([id number]) Cancer: No; Diabetes: No; Heart Disease: No; Hereditary Spherocytosis: No; Hypertension: No; Kidney Disease: No; Lung Disease: No; Seizures: No; Stroke: No; Thyroid Problems: No; Tuberculosis: No; Former smoker; Marital Status - Married; Alcohol Use: Rarely - 2oz day; Drug Use: No History; Caffeine Use: Daily; Financial Concerns: No; Food, Clothing or Shelter Needs: No; Support System Lacking: No; Transportation Concerns: No; Advanced Directives: Yes (Not Provided); Patient does not want information on Advanced Directives; Do not resuscitate: No; Living Will: Yes (Not Provided); Medical Power of Attorney: No Physician Affirmation I have reviewed and agree with the above information. Electronic Signature(s) Signed: 11/29/2017 9:42:33 PM By: Worthy Keeler PA-C Signed: 12/03/2017 5:40:20 PM By: Montey Hora Entered By: Worthy Keeler on 11/29/2017 21:35:15 Kulik, Greydon L. ([id number]) -------------------------------------------------------------------------------- SuperBill Details Patient Name: VALERIANO, Caeden L. Date of Service: 11/29/2017 Medical Record Number: [id number] Patient Account Number: [id number] Date of Birth/Sex: 25-Sep-1925 (84 y.o. M) Treating RN: Montey Hora Primary Care Provider: [facility]onita [facility] Other Clinician: Referring Provider: Ramon[facility] Treating Provider/Extender: Melburn Hake, Will Heinkel Weeks in Treatment: 14 Diagnosis Coding ICD-10 Codes Code Description L89.620 Pressure ulcer of left heel, unstageable L89.610 Pressure ulcer of right heel, unstageable I48.2 Chronic atrial fibrillation I10 Essential (primary) hypertension Z79.01 Long term (current) use of anticoagulants Facility Procedures CPT4 Code: [id number] Description: 61224 - DEB SUBQ TISSUE 20 SQ CM/< ICD-10 Diagnosis Description L89.620 Pressure ulcer of left heel, unstageable L89.610 Pressure ulcer of right heel,  unstageable Modifier: Quantity: 1 Physician Procedures CPT4 Code: [id number] Description: 51102 - WC PHYS SUBQ TISS 20 SQ CM ICD-10 Diagnosis Description L89.620 Pressure ulcer of left heel, unstageable L89.610 Pressure ulcer of right heel, unstageable Modifier: Quantity: 1 Electronic Signature(s) Signed: 11/29/2017 9:42:33 PM By: Worthy Keeler PA-C Entered By: Worthy Keeler on 11/29/2017 21:36:09

## 2017-12-09 ENCOUNTER — Encounter: Payer: PPO | Attending: Physician Assistant | Admitting: Physician Assistant

## 2017-12-09 DIAGNOSIS — L89610 Pressure ulcer of right heel, unstageable: Secondary | ICD-10-CM | POA: Diagnosis not present

## 2017-12-09 DIAGNOSIS — I10 Essential (primary) hypertension: Secondary | ICD-10-CM | POA: Diagnosis not present

## 2017-12-09 DIAGNOSIS — Z7901 Long term (current) use of anticoagulants: Secondary | ICD-10-CM | POA: Diagnosis not present

## 2017-12-09 DIAGNOSIS — L89613 Pressure ulcer of right heel, stage 3: Secondary | ICD-10-CM | POA: Diagnosis not present

## 2017-12-09 DIAGNOSIS — I482 Chronic atrial fibrillation: Secondary | ICD-10-CM | POA: Insufficient documentation

## 2017-12-09 DIAGNOSIS — Z87891 Personal history of nicotine dependence: Secondary | ICD-10-CM | POA: Insufficient documentation

## 2017-12-09 DIAGNOSIS — Z923 Personal history of irradiation: Secondary | ICD-10-CM | POA: Diagnosis not present

## 2017-12-09 DIAGNOSIS — L89620 Pressure ulcer of left heel, unstageable: Secondary | ICD-10-CM | POA: Diagnosis not present

## 2017-12-09 DIAGNOSIS — L89623 Pressure ulcer of left heel, stage 3: Secondary | ICD-10-CM | POA: Insufficient documentation

## 2017-12-10 ENCOUNTER — Other Ambulatory Visit: Payer: Self-pay | Admitting: *Deleted

## 2017-12-10 NOTE — Progress Notes (Addendum)
Custer, Drevon L. ([id number]) Visit Report for 12/09/2017 Arrival Information Details Patient Name: [name], [name] Date of Service: 12/09/2017 12:30 PM Medical Record Number: [id number] Patient Account Number: [id number] Date of Birth/Sex: 1925-10-18 (84 y.o. M) Treating RN: Secundino Ginger Primary Care Keyler Hoge: Ramonita Lab Other Clinician: Referring Yena Tisby: Ramonita Lab Treating Knolan Simien/Extender: Melburn Hake, HOYT Weeks in Treatment: 16 Visit Information History Since Last Visit All ordered tests and consults were completed: No Patient Arrived: Ambulatory Added or deleted any medications: No Arrival Time: 12:39 Any new allergies or adverse reactions: No Accompanied By: wife Had a fall or experienced change in No Transfer Assistance: None activities of daily living that may affect Patient Identification Verified: Yes risk of falls: Secondary Verification Process Yes Signs or symptoms of abuse/neglect since last visito No Completed: Hospitalized since last visit: No Patient Requires Transmission-Based No Has Dressing in Place as Prescribed: Yes Precautions: Has Compression in Place as Prescribed: Yes Patient Has Alerts: Yes Pain Present Now: No Patient Alerts: Patient on Blood Thinner Warfarin Electronic Signature(s) Signed: 12/10/2017 11:57:38 AM By: Secundino Ginger Entered By: Secundino Ginger on 12/09/2017 12:40:05 Zagal, Ismar L. ([id number]) -------------------------------------------------------------------------------- Complex / Palliative Patient Assessment Details Patient Name: Sheppard, Wylie L. Date of Service: 12/09/2017 12:30 PM Medical Record Number: [id number] Patient Account Number: [id number] Date of Birth/Sex: 1926/04/14 (84 y.o. M) Treating RN: Cornell Barman Primary Care Shervon Kerwin: Ramonita Lab Other Clinician: Referring Royer Cristobal: Ramonita Lab Treating Enis Riecke/Extender: Melburn Hake, HOYT Weeks in Treatment: 16 Palliative Management Criteria Complex Wound Management  Criteria Patient has remarkable or complex co-morbidities requiring medications or treatments that extend wound healing times. Examples: o Diabetes mellitus with chronic renal failure or end stage renal disease requiring dialysis o Advanced or poorly controlled rheumatoid arthritis o Diabetes mellitus and end stage chronic obstructive pulmonary disease o Active cancer with current chemo- or radiation therapy Pelvic fracture-immobility Care Approach Wound Care Plan: Complex Wound Management Electronic Signature(s) Signed: 12/10/2017 3:28:46 PM By: Gretta Cool, BSN, RN, CWS, Kim RN, BSN Signed: 12/11/2017 2:39:25 AM By: Worthy Keeler PA-C Entered By: Gretta Cool, BSN, RN, CWS, Kim on 12/10/2017 15:28:46 Lundahl, Rajveer L. ([id number]) -------------------------------------------------------------------------------- Encounter Discharge Information Details Patient Name: MAKELL, CYR. Date of Service: 12/09/2017 12:30 PM Medical Record Number: [id number] Patient Account Number: [id number] Date of Birth/Sex: 01/05/1926 (84 y.o. M) Treating RN: Ahmed Prima Primary Care Tela Kotecki: Ramonita Lab Other Clinician: Referring Tauri Ethington: Ramonita Lab Treating Shahzad Thomann/Extender: Melburn Hake, HOYT Weeks in Treatment: 16 Encounter Discharge Information Items Discharge Condition: Stable Ambulatory Status: Walker Discharge Destination: Home Transportation: Private Auto Accompanied By: wife Schedule Follow-up Appointment: Yes Clinical Summary of Care: Electronic Signature(s) Signed: 12/09/2017 5:18:34 PM By: Alric Quan Entered By: Alric Quan on 12/09/2017 13:33:44 Gero, Mikel L. ([id number]) -------------------------------------------------------------------------------- Lower Extremity Assessment Details Patient Name: Peak, Kendre L. Date of Service: 12/09/2017 12:30 PM Medical Record Number: [id number] Patient Account Number: [id number] Date of Birth/Sex: 1925-10-12 (84 y.o. M) Treating RN:  Secundino Ginger Primary Care Orvile Corona: Ramonita Lab Other Clinician: Referring Salah Nakamura: Ramonita Lab Treating Ammaar Encina/Extender: Melburn Hake, HOYT Weeks in Treatment: 16 Edema Assessment Assessed: [Left: No] [Right: No] [Left: Edema] [Right: :] Calf Left: Right: Point of Measurement: 34 cm From Medial Instep 31.5 cm 35 cm Ankle Left: Right: Point of Measurement: 11 cm From Medial Instep 22.5 cm 23.5 cm Vascular Assessment Claudication: Claudication Assessment [Left:None] [Right:None] Pulses: Dorsalis Pedis Palpable: [Left:Yes] [Right:Yes] Posterior Tibial Extremity colors, hair growth, and conditions: Extremity Color: [Left:Normal] [Right:Normal] Hair Growth on Extremity: [Left:No] [Right:No] Temperature of Extremity: [Right:Warm] Capillary Refill: [Left:<  3 seconds] [Right:< 3 seconds] Toe Nail Assessment Left: Right: Thick: Yes Yes Discolored: Yes Yes Deformed: Yes Yes Improper Length and Hygiene: No No Electronic Signature(s) Signed: 12/10/2017 11:57:38 AM By: Secundino Ginger Entered By: Secundino Ginger on 12/09/2017 13:00:36 Santo, Theus L. ([id number]) -------------------------------------------------------------------------------- Multi Wound Chart Details Patient Name: MCFARREN, Cleon L. Date of Service: 12/09/2017 12:30 PM Medical Record Number: [id number] Patient Account Number: [id number] Date of Birth/Sex: [birth year] (84 y.o. M) Treating RN: Roger Shelter Primary Care Lydell Moga: Ramonita Lab Other Clinician: Referring Jaton Eilers: Ramonita Lab Treating Kalanie Fewell/Extender: Melburn Hake, HOYT Weeks in Treatment: 16 Vital Signs Height(in): 70 Pulse(bpm): 85 Weight(lbs): 190 Blood Pressure(mmHg): 112/52 Body Mass Index(BMI): 27 Temperature(F): 98 Respiratory Rate 16 (breaths/min): Photos: [1:No Photos] [2:No Photos] [N/A:N/A] Wound Location: [1:Left Calcaneus] [2:Right Calcaneus] [N/A:N/A] Wounding Event: [1:Pressure Injury] [2:Pressure Injury] [N/A:N/A] Primary Etiology:  [1:Pressure Ulcer] [2:Pressure Ulcer] [N/A:N/A] Comorbid History: [1:Arrhythmia, Hypertension, Received Radiation] [2:Arrhythmia, Hypertension, Received Radiation] [N/A:N/A] Date Acquired: [1:08/12/2017] [2:08/12/2017] [N/A:N/A] Weeks of Treatment: [1:16] [2:16] [N/A:N/A] Wound Status: [1:Open] [2:Open] [N/A:N/A] Measurements L x W x D [1:3x3.5x0.2] [2:1.6x1.5x0.1] [N/A:N/A] (cm) Area (cm) : [1:8.247] [2:1.885] [N/A:N/A] Volume (cm) : [1:1.649] [2:0.188] [N/A:N/A] % Reduction in Area: [1:64.00%] [2:83.60%] [N/A:N/A] % Reduction in Volume: [1:64.00%] [2:91.80%] [N/A:N/A] Classification: [1:Unstageable/Unclassified] [2:Unstageable/Unclassified] [N/A:N/A] Exudate Amount: [1:Small] [2:Small] [N/A:N/A] Exudate Type: [1:Serous] [2:N/A] [N/A:N/A] Exudate Color: [1:amber] [2:N/A] [N/A:N/A] Granulation Amount: [1:Large (67-100%)] [2:Large (67-100%)] [N/A:N/A] Granulation Quality: [1:Pink, Hyper-granulation] [2:Red, Pink] [N/A:N/A] Necrotic Amount: [1:Small (1-33%)] [2:Small (1-33%)] [N/A:N/A] Exposed Structures: [1:Fat Layer (Subcutaneous Tissue) Exposed: Yes Fascia: No Tendon: No Muscle: No Joint: No Bone: No] [2:Fat Layer (Subcutaneous Tissue) Exposed: Yes Fascia: No Tendon: No Muscle: No Joint: No Bone: No] [N/A:N/A] Periwound Skin Texture: [1:Excoriation: Yes Induration: No Callus: No Crepitus: No Rash: No Scarring: No] [2:No Abnormalities Noted] [N/A:N/A] Periwound Skin Moisture: [1:No Abnormalities Noted] [2:No Abnormalities Noted] [N/A:N/A] Periwound Skin Color: [1:No Abnormalities Noted] [2:No Abnormalities Noted] [N/A:N/A] Tenderness on Palpation: No No N/A Wound Preparation: Ulcer Cleansing: Other: ns Ulcer Cleansing: Other: NS N/A Topical Anesthetic Applied: Topical Anesthetic Applied: Other: lidocaine 4% Other: lidocaine 4% Treatment Notes Electronic Signature(s) Signed: 12/09/2017 5:11:24 PM By: Roger Shelter Entered By: Roger Shelter on 12/09/2017 13:09:51 Edwards, [place]  ([id number]) -------------------------------------------------------------------------------- [place] Details Patient Name: ROBBIN, ESCHER. Date of Service: 12/09/2017 12:30 PM Medical Record Number: [id number] Patient Account Number: [id number] Date of Birth/Sex: 1926-04-03 (84 y.o. M) Treating RN: Roger Shelter Primary Care Kirtis Challis: Ramonita Lab Other Clinician: Referring Tanara Turvey: Ramonita Lab Treating Maruice Pieroni/Extender: Melburn Hake, HOYT Weeks in Treatment: 16 Active Inactive ` Abuse / Safety / Falls / Self Care Management Nursing Diagnoses: History of Falls Potential for falls Goals: Patient will not experience any injury related to falls Date Initiated: 08/19/2017 Target Resolution Date: 12/14/2017 Goal Status: Active Interventions: Assess Activities of Daily Living upon admission and as needed Assess fall risk on admission and as needed Assess: immobility, friction, shearing, incontinence upon admission and as needed Assess impairment of mobility on admission and as needed per policy Assess personal safety and home safety (as indicated) on admission and as needed Assess self care needs on admission and as needed Notes: ` Nutrition Nursing Diagnoses: Imbalanced nutrition Potential for alteratiion in Nutrition/Potential for imbalanced nutrition Goals: Patient/caregiver agrees to and verbalizes understanding of need to use nutritional supplements and/or vitamins as prescribed Date Initiated: 08/19/2017 Target Resolution Date: 11/16/2017 Goal Status: Active Interventions: Assess patient nutrition upon admission and as needed per policy Notes: ` Orientation to the Wound Care Program Nursing Diagnoses: SALAHUDDIN, ARISMENDEZ  L. ([id number]) Knowledge deficit related to the wound healing center program Goals: Patient/caregiver will verbalize understanding of the [facility] Date Initiated: 08/19/2017 Target Resolution Date: 09/14/2017 Goal Status:  Active Interventions: Provide education on orientation to the wound center Notes: ` Pain, Acute or Chronic Nursing Diagnoses: Pain, acute or chronic: actual or potential Potential alteration in comfort, pain Goals: Patient/caregiver will verbalize adequate pain control between visits Date Initiated: 08/19/2017 Target Resolution Date: 12/14/2017 Goal Status: Active Interventions: Complete pain assessment as per visit requirements Notes: ` Pressure Nursing Diagnoses: Knowledge deficit related to causes and risk factors for pressure ulcer development Knowledge deficit related to management of pressures ulcers Potential for impaired tissue integrity related to pressure, friction, moisture, and shear Goals: Patient will remain free from development of additional pressure ulcers Date Initiated: 08/19/2017 Target Resolution Date: 12/14/2017 Goal Status: Active Interventions: Assess: immobility, friction, shearing, incontinence upon admission and as needed Assess potential for pressure ulcer upon admission and as needed Notes: ` Wound/Skin Impairment Nursing Diagnoses: Impaired tissue integrity Knowledge deficit related to ulceration/compromised skin integrity Rosiles, Yanky L. ([id number]) Goals: Ulcer/skin breakdown will have a volume reduction of 80% by week 12 Date Initiated: 08/19/2017 Target Resolution Date: 12/14/2017 Goal Status: Active Interventions: Assess patient/caregiver ability to perform ulcer/skin care regimen upon admission and as needed Assess ulceration(s) every visit Notes: Electronic Signature(s) Signed: 12/09/2017 5:11:24 PM By: Roger Shelter Entered By: Roger Shelter on 12/09/2017 13:09:40 Peary, Elizandro L. ([id number]) -------------------------------------------------------------------------------- Pain Assessment Details Patient Name: DERISE, Eleazar L. Date of Service: 12/09/2017 12:30 PM Medical Record Number: [id number] Patient Account Number:  [id number] Date of Birth/Sex: 02-Sep-1925 (84 y.o. M) Treating RN: Secundino Ginger Primary Care Macie Baum: Ramonita Lab Other Clinician: Referring Rilyn Upshaw: Ramonita Lab Treating Latrell Reitan/Extender: Melburn Hake, HOYT Weeks in Treatment: 16 Active Problems Location of Pain Severity and Description of Pain Patient Has Paino No Site Locations Pain Management and Medication Current Pain Management: Electronic Signature(s) Signed: 12/10/2017 11:57:38 AM By: Secundino Ginger Entered By: Secundino Ginger on 12/09/2017 12:41:41 Eshbach, Devron L. ([id number]) -------------------------------------------------------------------------------- Patient/Caregiver Education Details Patient Name: AYDENN, GERVIN L. Date of Service: 12/09/2017 12:30 PM Medical Record Number: [id number] Patient Account Number: [id number] Date of Birth/Gender: [birth year] (84 y.o. M) Treating RN: Ahmed Prima Primary Care Physician: Ramonita Lab Other Clinician: Referring Physician: Ramonita Lab Treating Physician/Extender: Sharalyn Ink in Treatment: 16 Education Assessment Education Provided To: Patient and Caregiver wife Education Topics Provided Wound/Skin Impairment: Handouts: Caring for Your Ulcer, Skin Care Do's and Dont's, Other: change dressing as ordered Methods: Demonstration, Explain/Verbal Responses: State content correctly Electronic Signature(s) Signed: 12/09/2017 5:18:34 PM By: Alric Quan Entered By: Alric Quan on 12/09/2017 13:34:16 Carmicheal, Juandedios L. ([id number]) -------------------------------------------------------------------------------- Wound Assessment Details Patient Name: BOIKE, Andie L. Date of Service: 12/09/2017 12:30 PM Medical Record Number: [id number] Patient Account Number: [id number] Date of Birth/Sex: 09-May-1926 (84 y.o. M) Treating RN: Secundino Ginger Primary Care Alisson Rozell: Ramonita Lab Other Clinician: Referring Arlan Birks: Ramonita Lab Treating Jaquawn Saffran/Extender: Melburn Hake, HOYT Weeks in Treatment:  16 Wound Status Wound Number: 1 Primary Etiology: Pressure Ulcer Wound Location: Left Calcaneus Wound Status: Open Wounding Event: Pressure Injury Comorbid Arrhythmia, Hypertension, Received History: Radiation Date Acquired: 08/12/2017 Weeks Of Treatment: 16 Clustered Wound: No Photos Photo Uploaded By: Gretta Cool, BSN, RN, CWS, Kim on 12/09/2017 17:22:10 Wound Measurements Length: (cm) 3 % Reducti Width: (cm) 3.5 % Reducti Depth: (cm) 0.2 Tunneling Area: (cm) 8.247 Undermin Volume: (cm) 1.649 on in Area: 64% on in Volume: 64% : No ing: No Wound Description Classification: Category/Stage  III Foul Odor Wound Margin: Distinct, outline attached Slough/Fi Exudate Amount: Small Exudate Type: Serosanguineous Exudate Color: red, brown After Cleansing: No brino Yes Wound Bed Granulation Amount: Large (67-100%) Exposed Structure Granulation Quality: Pink, Hyper-granulation Fascia Exposed: No Necrotic Amount: Small (1-33%) Fat Layer (Subcutaneous Tissue) Exposed: Yes Necrotic Quality: Adherent Slough Tendon Exposed: No Muscle Exposed: No Joint Exposed: No Bone Exposed: No Periwound Skin Texture Bohle, Bernon L. ([id number]) Texture Color No Abnormalities Noted: No No Abnormalities Noted: Yes Callus: No Crepitus: No Excoriation: Yes Induration: No Rash: No Scarring: No Moisture No Abnormalities Noted: No Wound Preparation Ulcer Cleansing: Other: ns, Topical Anesthetic Applied: Other: lidocaine 4%, Treatment Notes Wound #1 (Left Calcaneus) 1. Cleansed with: Clean wound with Normal Saline 2. Anesthetic Topical Lidocaine 4% cream to wound bed prior to debridement 4. Dressing Applied: Iodoflex 5. Secondary Dressing Applied Dry Gauze Kerlix/Conform 7. Secured with Tape Notes heel cup Electronic Signature(s) Signed: 12/09/2017 9:07:56 PM By: Worthy Keeler PA-C Signed: 12/10/2017 11:57:38 AM By: Secundino Ginger Entered By: Worthy Keeler on 12/09/2017  13:36:19 Perz, Ric L. ([id number]) -------------------------------------------------------------------------------- Wound Assessment Details Patient Name: FENLON, Muriel L. Date of Service: 12/09/2017 12:30 PM Medical Record Number: [id number] Patient Account Number: [id number] Date of Birth/Sex: 05/04/1926 (84 y.o. M) Treating RN: Secundino Ginger Primary Care Eloise Picone: Ramonita Lab Other Clinician: Referring Ishita Mcnerney: Ramonita Lab Treating Shadasia Oldfield/Extender: Melburn Hake, HOYT Weeks in Treatment: 16 Wound Status Wound Number: 2 Primary Etiology: Pressure Ulcer Wound Location: Right Calcaneus Wound Status: Open Wounding Event: Pressure Injury Comorbid Arrhythmia, Hypertension, Received History: Radiation Date Acquired: 08/12/2017 Weeks Of Treatment: 16 Clustered Wound: No Photos Photo Uploaded By: Gretta Cool, BSN, RN, CWS, Kim on 12/09/2017 17:22:10 Wound Measurements Length: (cm) 1.6 Width: (cm) 1.5 Depth: (cm) 0.1 Area: (cm) 1.885 Volume: (cm) 0.188 % Reduction in Area: 83.6% % Reduction in Volume: 91.8% Wound Description Classification: Category/Stage III Wound Margin: Distinct, outline attached Exudate Amount: Small Foul Odor After Cleansing: No Slough/Fibrino Yes Wound Bed Granulation Amount: Large (67-100%) Exposed Structure Granulation Quality: Red, Pink Fascia Exposed: No Necrotic Amount: Small (1-33%) Fat Layer (Subcutaneous Tissue) Exposed: Yes Necrotic Quality: Adherent Slough Tendon Exposed: No Muscle Exposed: No Joint Exposed: No Bone Exposed: No Periwound Skin Texture Texture Color No Abnormalities Noted: No No Abnormalities Noted: No Kollman, [place] ([id number]) Moisture No Abnormalities Noted: No Wound Preparation Ulcer Cleansing: Other: NS, Topical Anesthetic Applied: Other: lidocaine 4%, Treatment Notes Wound #2 (Right Calcaneus) 1. Cleansed with: Clean wound with Normal Saline 2. Anesthetic Topical Lidocaine 4% cream to wound bed prior to  debridement 4. Dressing Applied: Prisma Ag 5. Secondary Dressing Applied Dry Gauze Kerlix/Conform Notes heel cup Electronic Signature(s) Signed: 12/09/2017 9:07:56 PM By: Worthy Keeler PA-C Signed: 12/10/2017 11:57:38 AM By: Secundino Ginger Entered By: Worthy Keeler on 12/09/2017 13:36:43 Fugitt, Sion L. ([id number]) -------------------------------------------------------------------------------- Vitals Details Patient Name: SYMES, Stanley L. Date of Service: 12/09/2017 12:30 PM Medical Record Number: [id number] Patient Account Number: [id number] Date of Birth/Sex: 08-13-1925 (84 y.o. M) Treating RN: Secundino Ginger Primary Care Jaidyn Usery: Ramonita Lab Other Clinician: Referring Kenji Mapel: Ramonita Lab Treating Skyleigh Windle/Extender: Melburn Hake, HOYT Weeks in Treatment: 16 Vital Signs Time Taken: 12:30 Temperature (F): 98 Height (in): 70 Pulse (bpm): 85 Weight (lbs): 190 Respiratory Rate (breaths/min): 16 Body Mass Index (BMI): 27.3 Blood Pressure (mmHg): 112/52 Reference Range: 80 - 120 mg / dl Electronic Signature(s) Signed: 12/10/2017 11:57:38 AM By: Secundino Ginger Entered BySecundino Ginger on 12/09/2017 12:42:25

## 2017-12-10 NOTE — Progress Notes (Signed)
Matsumura, Brylee L. ([id number]) Visit Report for 12/09/2017 Chief Complaint Document Details Patient Name: [name] [name], [name] [name]. Date of Service: 12/09/2017 12:30 PM Medical Record Number: [id number] Patient Account Number: [id number] Date of Birth/Sex: 17-Jan-1926 (84 y.o. M) Treating RN: Roger Shelter Primary Care Provider: Ramonita Lab Other Clinician: Referring Provider: Ramonita Lab Treating Provider/Extender: Melburn Hake, HOYT Weeks in Treatment: 16 Information Obtained from: Patient Chief Complaint Bilateral Heel pressure ulcers and bilateral great toe DTIs Electronic Signature(s) Signed: 12/09/2017 9:07:56 PM By: Worthy Keeler PA-C Entered By: Worthy Keeler on 12/09/2017 12:48:47 [name], [name] ([id number]) -------------------------------------------------------------------------------- Debridement Details Patient Name: [name], [name] L. Date of Service: 12/09/2017 12:30 PM Medical Record Number: [id number] Patient Account Number: [id number] Date of Birth/Sex: Jun 06, 1926 (84 y.o. M) Treating RN: Roger Shelter Primary Care Provider: Ramonita Lab Other Clinician: Referring Provider: Ramonita Lab Treating Provider/Extender: Melburn Hake, HOYT Weeks in Treatment: 16 Debridement Performed for Wound #2 Right Calcaneus Assessment: Performed By: Physician STONE III, HOYT E., PA-C Debridement Type: Debridement Pre-procedure Verification/Time Yes - 13:11 Out Taken: Start Time: 13:11 Pain Control: Other : lidocaine 4% Total Area Debrided (L x W): 1.6 (cm) x 1.5 (cm) = 2.4 (cm) Tissue and other material Viable, Non-Viable, Slough, Subcutaneous, Skin: Dermis , Skin: Epidermis, Biofilm, Slough debrided: Level: Skin/Subcutaneous Tissue Debridement Description: Excisional Instrument: Curette Bleeding: Minimum Hemostasis Achieved: Pressure End Time: 13:12 Procedural Pain: 0 Post Procedural Pain: 0 Response to Treatment: Procedure was tolerated well Level of Consciousness: Awake and  Alert Post Procedure Vitals: Temperature: 98.0 Pulse: 85 Respiratory Rate: 18 Blood Pressure: Systolic Blood Pressure: 629 Diastolic Blood Pressure: 52 Post Debridement Measurements of Total Wound Length: (cm) 1.6 Stage: Unstageable/Unclassified Width: (cm) 1.5 Depth: (cm) 0.2 Volume: (cm) 0.377 Character of Wound/Ulcer Post Stable Debridement: Post Procedure Diagnosis Same as Pre-procedure Electronic Signature(s) Signed: 12/09/2017 5:11:24 PM By: Roger Shelter Signed: 12/09/2017 9:07:56 PM By: Worthy Keeler PA-C Entered By: Roger Shelter on 12/09/2017 13:13:31 [name] [name] ([id number]) [name] [name] ([id number]) -------------------------------------------------------------------------------- Debridement Details Patient Name: [name], [name] L. Date of Service: 12/09/2017 12:30 PM Medical Record Number: [id number] Patient Account Number: [id number] Date of Birth/Sex: [birth year] (84 y.o. M) Treating RN: Roger Shelter Primary Care Provider: Ramonita Lab Other Clinician: Referring Provider: Ramonita Lab Treating Provider/Extender: Melburn Hake, HOYT Weeks in Treatment: 16 Debridement Performed for Wound #1 Left Calcaneus Assessment: Performed By: Physician STONE III, HOYT E., PA-C Debridement Type: Debridement Pre-procedure Verification/Time Yes - 13:11 Out Taken: Start Time: 13:11 Pain Control: Other : lidocaine 4% Total Area Debrided (L x W): 3 (cm) x 3.5 (cm) = 10.5 (cm) Tissue and other material Viable, Non-Viable, Slough, Subcutaneous, Skin: Dermis , Skin: Epidermis, Biofilm, Slough debrided: Level: Skin/Subcutaneous Tissue Debridement Description: Excisional Instrument: Curette Bleeding: Minimum Hemostasis Achieved: Pressure End Time: 13:12 Procedural Pain: 0 Post Procedural Pain: 0 Response to Treatment: Procedure was tolerated well Level of Consciousness: Awake and Alert Post Procedure Vitals: Temperature: 98.0 Pulse: 85 Respiratory Rate:  18 Blood Pressure: Systolic Blood Pressure: 595 Diastolic Blood Pressure: 52 Post Debridement Measurements of Total Wound Length: (cm) 3 Stage: Unstageable/Unclassified Width: (cm) 3.5 Depth: (cm) 0.3 Volume: (cm) 2.474 Character of Wound/Ulcer Post Stable Debridement: Post Procedure Diagnosis Same as Pre-procedure Electronic Signature(s) Signed: 12/09/2017 5:11:24 PM By: Roger Shelter Signed: 12/09/2017 9:07:56 PM By: Worthy Keeler PA-C Entered By: Roger Shelter on 12/09/2017 13:14:52 [name] [name] ([id number]) [name] [name] ([id number]) -------------------------------------------------------------------------------- HPI Details Patient Name: [name], [name] L. Date of Service: 12/09/2017 12:30 PM Medical Record Number: [id number] Patient Account  Number: [id number] Date of Birth/Sex: Aug 03, 1925 (84 y.o. M) Treating RN: Roger Shelter Primary Care Provider: Ramonita Lab Other Clinician: Referring Provider: Ramonita Lab Treating Provider/Extender: Melburn Hake, HOYT Weeks in Treatment: 16 History of Present Illness Associated Signs and Symptoms: Patient has a history of chronic atrophic relation, hypertension, and long-term use of anticoagulants. He also had a fall with pelvic fracture which occurred on January 2019. HPI Description: 08/19/17 patient presents today for initial evaluation and our clinic concerning issues he has been having with the bilateral heels as well as the lateral great toe was since he was admitted to the nursing facility following a pelvic fracture. Initially he was spending a significant amount of time in the bed although he is now up more often since the healing process has progressed along the way obviously. When he is up he's spending most of the time sitting in a wheelchair at this point. When he is in bed he lays on his back and the covers/sheets are pushing down on his toes bilaterally which I think it may be what's causing the issues with his  deep tissue injury to the bilateral great toes. With that being said the hills also appear to be pressure in nature and I think that laying in the bed getting pressure to the sites is what has led to the formation of these on stage will pressure ulcers. Fortunately there does not appear to be any infection in both areas of ulceration of the heel appear to be stable at this point. Patient does not have any significant pain at the site she does have some of the eschar along the edges which is starting to lift up and come all hopefully slowly but surely this will occur. Patient did have arterial studies performed April 2018 which showed a normal TBI on the right knee slightly depressed TBI on the left but this does not appear to have changed significantly at that point. Specifically this was 0.75 on the right and 0.61 on the left. 09/13/17 on evaluation today patient presents for a follow-up evaluation regarding his bilateral heal ulcers. He has been tolerating the dressing changes fairly well although he did have some bleeding earlier today when it appears some of the skin on the surrounding portion of the wound got pulled where this is starting to flake off. Subsequently there does not appear to be any bleeding right now. Since I last saw him he has been discharged from the facility and is now back at home. His wife is having a very difficult time being able to get him up and in the car in order to come for an appointment which is why they missed the last appointment have not been able to come back sooner. Fortunately the he does not have any signs of infection. No fevers chills noted. He does have difficulty walking due to pain in the bilateral heels. 10/11/17 on evaluation today patient presents for follow-up evaluation concerning his ongoing bilateral heal ulcers. He has been tolerating the dressing changes which were Betadine paints daily without complication. I have not seen him since September 13, 2017.  Pressure that I saw him August 19, 2017. Subsequently those are the only prior visits before today. His left heel actually seems to have a significant amount of eschar that is starting to lift up at this point and seems to be a little more loose as far as drainage building up underneath the eschar. The right eschar though there are some parts lifting up seems  to still be fairly stable which is good news. His toe ulcer is almost completely healed a lot of that eschar came off and it looks very well underneath. 10/25/17 on evaluation today patient presents today regarding his bilateral heal ulcers in his right great toe ulcer. At this point his heels actually have loosened and softened to the point that I think we do need to remove these at this time. With that being said I think this will actually help them to heal much better and much more quickly currently. With that being said patient was a little bit worried about the debridement as far as pain was concerned fortunately this seemed to go very well today. 11/08/17 evaluation today patient's hills actually appear to be doing better compared to last 2 week's evaluation. The right heel especially is doing great and show signs of excellent granulation at this time. With that being said the left heel is much deeper and I was able to clean away a lot of the slough and subcutaneous tissue at this point he still has some noted that will take time to clear away. With that being said he has been tolerating the dressing changes will complication the doesn't of been some issues with getting the proper supplies I'm not exactly sure what the issue is there however we are to contact home health and try to figure this out. In the meantime fortunately he does not seem to have any significant pain on the right he does have some pain walking on the left which makes sense. 11/15/17 on evaluation today patient appears to be doing well at this point in time in regard to  his bilateral heal ulcers. He does Burek, Elven L. ([id number]) still have slough noted over both especially the left although both seem to be making signs of improvement. Currently there is no evidence of infection he does have some discomfort on the left really nothing on the right. 11/22/17 on evaluation today patient appears to be doing very well in regard to his heels which are definitely making progress. This has been slow but nonetheless is definitely progressing. Fortunately there is no evidence of infection No fevers, chills, nausea, or vomiting noted at this time. We are still having issues with supplies and getting the Iodoflex for the patient at home. 11/29/17 on evaluation today patient's bilateral heels appear to be doing better. Especially the right heel which is making excellent progress and doing very well. Fortunately he shows no signs of infection which is great news. Overall I do feel like he is improving. Of note home health has been canceled at this point and now some supplies are being ordered from an outside company according to the patient and his wife. With that being said I'm not sure the company and I'm not sure when they may actually receive the supplies. Nonetheless I still think the Iodoflex has been beneficial for him in the interim when she runs out of the Iodoflex his wife is using Santyl which is also good in my pinion. 12/09/17 on evaluation today patient appears to be doing very well in regard to his bilateral heal ulcers. The right heel is actually filling in quite nicely he still has some depth right in the central portion of the wound but overall he is very little slough noted at this point. It will require some sharp debridement however. The left heel is also shown signs of improving although it's little bit lagging behind the other side it was also  larger to begin with. Overall I'm extremely pleased with the overall progress however. Electronic  Signature(s) Signed: 12/09/2017 9:07:56 PM By: Worthy Keeler PA-C Entered By: Worthy Keeler on 12/09/2017 13:23:46 Batrez, [name] L. ([id number]) -------------------------------------------------------------------------------- Physical Exam Details Patient Name: [name], [name] L. Date of Service: 12/09/2017 12:30 PM Medical Record Number: [id number] Patient Account Number: [id number] Date of Birth/Sex: 03-May-1926 (84 y.o. M) Treating RN: Roger Shelter Primary Care Provider: Ramonita Lab Other Clinician: Referring Provider: Ramonita Lab Treating Provider/Extender: Melburn Hake, HOYT Weeks in Treatment: 71 Constitutional Well-nourished and well-hydrated in no acute distress. Respiratory normal breathing without difficulty. Psychiatric this patient is able to make decisions and demonstrates good insight into disease process. Alert and Oriented x 3. pleasant and cooperative. Notes At this time at both locations. In regard to the right heel this cleared off all the slough and there appears to be a good granulation bed. In regard to left heel I was able to remove a majority of the slough although there still some work to be done in this regard. Overall I'm very pleased he had very little pain on the left which has traditionally been fairly tender he really did not feel much today in fact he told me "you must not be doing a good [name]" when I was the breeding the wound. Obviously I'm very happy that is not having the pain that he was having before and post debridement the wound did appear to be doing much better. Electronic Signature(s) Signed: 12/09/2017 9:07:56 PM By: Worthy Keeler PA-C Entered By: Worthy Keeler on 12/09/2017 13:24:48 Isley, Dareion L. ([id number]) -------------------------------------------------------------------------------- Physician Orders Details Patient Name: [name], [name] L. Date of Service: 12/09/2017 12:30 PM Medical Record Number: [id number] Patient Account Number:  [id number] Date of Birth/Sex: 02-16-1926 (84 y.o. M) Treating RN: Roger Shelter Primary Care Provider: Ramonita Lab Other Clinician: Referring Provider: Ramonita Lab Treating Provider/Extender: Melburn Hake, HOYT Weeks in Treatment: 65 Verbal / Phone Orders: No Diagnosis Coding ICD-10 Coding Code Description L89.620 Pressure ulcer of left heel, unstageable L89.610 Pressure ulcer of right heel, unstageable I48.2 Chronic atrial fibrillation I10 Essential (primary) hypertension Z79.01 Long term (current) use of anticoagulants Wound Cleansing Wound #1 Left Calcaneus o Clean wound with Normal Saline. o Cleanse wound with mild soap and water o May Shower, gently pat wound dry prior to applying new dressing. Wound #2 Right Calcaneus o Clean wound with Normal Saline. o Cleanse wound with mild soap and water o May Shower, gently pat wound dry prior to applying new dressing. Anesthetic (add to Medication List) Wound #1 Left Calcaneus o Topical Lidocaine 4% cream applied to wound bed prior to debridement (In Clinic Only). Wound #2 Right Calcaneus o Topical Lidocaine 4% cream applied to wound bed prior to debridement (In Clinic Only). Skin Barriers/Peri-Wound Care Wound #1 Left Calcaneus o Skin Prep Wound #2 Right Calcaneus o Skin Prep Primary Wound Dressing Wound #1 Left Calcaneus o Iodoflex Wound #2 Right Calcaneus o Silver Collagen Secondary Dressing Wound #1 Left Calcaneus Adamec, Weylyn L. ([id number]) o Dry Gauze o Boardered Foam Dressing Wound #2 Right Calcaneus o Dry Gauze o Boardered Foam Dressing Dressing Change Frequency Wound #1 Left Calcaneus o Change dressing every other day. Wound #2 Right Calcaneus o Change dressing every other day. Follow-up Appointments Wound #1 Left Calcaneus o Return Appointment in 1 week. Wound #2 Right Calcaneus o Return Appointment in 1 week. Off-Loading Wound #1 Left Calcaneus o Heel  suspension boot to: - Please order prevalon boots for bilateral  feet Please float heel when pt is in the bed (NO PRESSURE ON HEELS) o Turn and reposition every 2 hours Wound #2 Right Calcaneus o Heel suspension boot to: - Please order prevalon boots for bilateral feet Please float heel when pt is in the bed (NO PRESSURE ON HEELS) o Turn and reposition every 2 hours Additional Orders / Instructions Wound #1 Left Calcaneus o Increase protein intake. Wound #2 Right Calcaneus o Increase protein intake. Home Health Wound #2 Right Calcaneus o D/C Home Health Services Electronic Signature(s) Signed: 12/09/2017 5:11:24 PM By: Roger Shelter Signed: 12/09/2017 9:07:56 PM By: Worthy Keeler PA-C Entered By: Roger Shelter on 12/09/2017 13:18:00 Reine, Shamal L. ([id number]) -------------------------------------------------------------------------------- Problem List Details Patient Name: [name], [name] L. Date of Service: 12/09/2017 12:30 PM Medical Record Number: [id number] Patient Account Number: [id number] Date of Birth/Sex: 18-Aug-1925 (84 y.o. M) Treating RN: Roger Shelter Primary Care Provider: Ramonita Lab Other Clinician: Referring Provider: Ramonita Lab Treating Provider/Extender: Melburn Hake, HOYT Weeks in Treatment: 16 Active Problems ICD-10 Impacting Encounter Code Description Active Date Wound Healing Diagnosis L89.623 Pressure ulcer of left heel, stage 3 08/19/2017 No Yes L89.613 Pressure ulcer of right heel, stage 3 08/19/2017 No Yes I48.2 Chronic atrial fibrillation 08/19/2017 No Yes I10 Essential (primary) hypertension 08/19/2017 No Yes Z79.01 Long term (current) use of anticoagulants 08/19/2017 No Yes Inactive Problems Resolved Problems Electronic Signature(s) Signed: 12/09/2017 9:07:56 PM By: Worthy Keeler PA-C Entered By: Worthy Keeler on 12/09/2017 13:37:34 [name], [name] L.  ([id number]) -------------------------------------------------------------------------------- Progress Note Details Patient Name: [name], [name] L. Date of Service: 12/09/2017 12:30 PM Medical Record Number: [id number] Patient Account Number: [id number] Date of Birth/Sex: [birth year] (84 y.o. M) Treating RN: Roger Shelter Primary Care Provider: Ramonita Lab Other Clinician: Referring Provider: Ramonita Lab Treating Provider/Extender: Melburn Hake, HOYT Weeks in Treatment: 16 Subjective Chief Complaint Information obtained from Patient Bilateral Heel pressure ulcers and bilateral great toe DTIs History of Present Illness (HPI) The following HPI elements were documented for the patient's wound: Associated Signs and Symptoms: Patient has a history of chronic atrophic relation, hypertension, and long-term use of anticoagulants. He also had a fall with pelvic fracture which occurred on January 2019. 08/19/17 patient presents today for initial evaluation and our clinic concerning issues he has been having with the bilateral heels as well as the lateral great toe was since he was admitted to the nursing facility following a pelvic fracture. Initially he was spending a significant amount of time in the bed although he is now up more often since the healing process has progressed along the way obviously. When he is up he's spending most of the time sitting in a wheelchair at this point. When he is in bed he lays on his back and the covers/sheets are pushing down on his toes bilaterally which I think it may be what's causing the issues with his deep tissue injury to the bilateral great toes. With that being said the hills also appear to be pressure in nature and I think that laying in the bed getting pressure to the sites is what has led to the formation of these on stage will pressure ulcers. Fortunately there does not appear to be any infection in both areas of ulceration of the heel appear to be stable at  this point. Patient does not have any significant pain at the site she does have some of the eschar along the edges which is starting to lift up and come all hopefully slowly but surely this will  occur. Patient did have arterial studies performed April 2018 which showed a normal TBI on the right knee slightly depressed TBI on the left but this does not appear to have changed significantly at that point. Specifically this was 0.75 on the right and 0.61 on the left. 09/13/17 on evaluation today patient presents for a follow-up evaluation regarding his bilateral heal ulcers. He has been tolerating the dressing changes fairly well although he did have some bleeding earlier today when it appears some of the skin on the surrounding portion of the wound got pulled where this is starting to flake off. Subsequently there does not appear to be any bleeding right now. Since I last saw him he has been discharged from the facility and is now back at home. His wife is having a very difficult time being able to get him up and in the car in order to come for an appointment which is why they missed the last appointment have not been able to come back sooner. Fortunately the he does not have any signs of infection. No fevers chills noted. He does have difficulty walking due to pain in the bilateral heels. 10/11/17 on evaluation today patient presents for follow-up evaluation concerning his ongoing bilateral heal ulcers. He has been tolerating the dressing changes which were Betadine paints daily without complication. I have not seen him since September 13, 2017. Pressure that I saw him August 19, 2017. Subsequently those are the only prior visits before today. His left heel actually seems to have a significant amount of eschar that is starting to lift up at this point and seems to be a little more loose as far as drainage building up underneath the eschar. The right eschar though there are some parts lifting up seems to  still be fairly stable which is good news. His toe ulcer is almost completely healed a lot of that eschar came off and it looks very well underneath. 10/25/17 on evaluation today patient presents today regarding his bilateral heal ulcers in his right great toe ulcer. At this point his heels actually have loosened and softened to the point that I think we do need to remove these at this time. With that being said I think this will actually help them to heal much better and much more quickly currently. With that being said patient was a little bit worried about the debridement as far as pain was concerned fortunately this seemed to go very well today. 11/08/17 evaluation today patient's hills actually appear to be doing better compared to last 2 week's evaluation. The right heel Cregan, [place] ([id number]) especially is doing great and show signs of excellent granulation at this time. With that being said the left heel is much deeper and I was able to clean away a lot of the slough and subcutaneous tissue at this point he still has some noted that will take time to clear away. With that being said he has been tolerating the dressing changes will complication the doesn't of been some issues with getting the proper supplies I'm not exactly sure what the issue is there however we are to contact home health and try to figure this out. In the meantime fortunately he does not seem to have any significant pain on the right he does have some pain walking on the left which makes sense. 11/15/17 on evaluation today patient appears to be doing well at this point in time in regard to his bilateral heal ulcers. He does still have  slough noted over both especially the left although both seem to be making signs of improvement. Currently there is no evidence of infection he does have some discomfort on the left really nothing on the right. 11/22/17 on evaluation today patient appears to be doing very well in regard to  his heels which are definitely making progress. This has been slow but nonetheless is definitely progressing. Fortunately there is no evidence of infection No fevers, chills, nausea, or vomiting noted at this time. We are still having issues with supplies and getting the Iodoflex for the patient at home. 11/29/17 on evaluation today patient's bilateral heels appear to be doing better. Especially the right heel which is making excellent progress and doing very well. Fortunately he shows no signs of infection which is great news. Overall I do feel like he is improving. Of note home health has been canceled at this point and now some supplies are being ordered from an outside company according to the patient and his wife. With that being said I'm not sure the company and I'm not sure when they may actually receive the supplies. Nonetheless I still think the Iodoflex has been beneficial for him in the interim when she runs out of the Iodoflex his wife is using Santyl which is also good in my pinion. 12/09/17 on evaluation today patient appears to be doing very well in regard to his bilateral heal ulcers. The right heel is actually filling in quite nicely he still has some depth right in the central portion of the wound but overall he is very little slough noted at this point. It will require some sharp debridement however. The left heel is also shown signs of improving although it's little bit lagging behind the other side it was also larger to begin with. Overall I'm extremely pleased with the overall progress however. Patient History Information obtained from Patient. Family History No family history of Cancer, Diabetes, Heart Disease, Hereditary Spherocytosis, Hypertension, Kidney Disease, Lung Disease, Seizures, Stroke, Thyroid Problems, Tuberculosis. Social History Former smoker, Marital Status - Married, Alcohol Use - Rarely - 2oz day, Drug Use - No History, Caffeine Use - Daily. Review of  Systems (ROS) Constitutional Symptoms (General Health) Denies complaints or symptoms of Fever, Chills. Respiratory The patient has no complaints or symptoms. Cardiovascular The patient has no complaints or symptoms. Psychiatric The patient has no complaints or symptoms. Objective Constitutional [name], [name] L. ([id number]) Well-nourished and well-hydrated in no acute distress. Vitals Time Taken: 12:30 PM, Height: 70 in, Weight: 190 lbs, BMI: 27.3, Temperature: 98 F, Pulse: 85 bpm, Respiratory Rate: 16 breaths/min, Blood Pressure: 112/52 mmHg. Respiratory normal breathing without difficulty. Psychiatric this patient is able to make decisions and demonstrates good insight into disease process. Alert and Oriented x 3. pleasant and cooperative. General Notes: At this time at both locations. In regard to the right heel this cleared off all the slough and there appears to be a good granulation bed. In regard to left heel I was able to remove a majority of the slough although there still some work to be done in this regard. Overall I'm very pleased he had very little pain on the left which has traditionally been fairly tender he really did not feel much today in fact he told me "you must not be doing a good [name]" when I was the breeding the wound. Obviously I'm very happy that is not having the pain that he was having before and post debridement the wound did appear to  be doing much better. Integumentary (Hair, Skin) Wound #1 status is Open. Original cause of wound was Pressure Injury. The wound is located on the Left Calcaneus. The wound measures 3cm length x 3.5cm width x 0.2cm depth; 8.247cm^2 area and 1.649cm^3 volume. There is Fat Layer (Subcutaneous Tissue) Exposed exposed. There is no tunneling or undermining noted. There is a small amount of serosanguineous drainage noted. The wound margin is distinct with the outline attached to the wound base. There is large (67-100%) pink, hyper  - granulation within the wound bed. There is a small (1-33%) amount of necrotic tissue within the wound bed including Adherent Slough. The periwound skin appearance had no abnormalities noted for color. The periwound skin appearance exhibited: Excoriation. The periwound skin appearance did not exhibit: Callus, Crepitus, Induration, Rash, Scarring. Wound #2 status is Open. Original cause of wound was Pressure Injury. The wound is located on the Right Calcaneus. The wound measures 1.6cm length x 1.5cm width x 0.1cm depth; 1.885cm^2 area and 0.188cm^3 volume. There is Fat Layer (Subcutaneous Tissue) Exposed exposed. There is a small amount of drainage noted. The wound margin is distinct with the outline attached to the wound base. There is large (67-100%) red, pink granulation within the wound bed. There is a small (1-33%) amount of necrotic tissue within the wound bed including Adherent Slough. Assessment Active Problems ICD-10 Pressure ulcer of left heel, stage 3 Pressure ulcer of right heel, stage 3 Chronic atrial fibrillation Essential (primary) hypertension Long term (current) use of anticoagulants Procedures [name], [name] L. ([id number]) Wound #1 Pre-procedure diagnosis of Wound #1 is a Pressure Ulcer located on the Left Calcaneus . There was a Excisional Skin/Subcutaneous Tissue Debridement with a total area of 10.5 sq cm performed by STONE III, HOYT E., PA-C. With the following instrument(s): Curette to remove Viable and Non-Viable tissue/material. Material removed includes Subcutaneous Tissue, Slough, Skin: Dermis, Skin: Epidermis, and Biofilm after achieving pain control using Other (lidocaine 4%). No specimens were taken. A time out was conducted at 13:11, prior to the start of the procedure. A Minimum amount of bleeding was controlled with Pressure. The procedure was tolerated well with a pain level of 0 throughout and a pain level of 0 following the procedure. Patient s Level of  Consciousness post procedure was recorded as Awake and Alert. Post Debridement Measurements: 3cm length x 3.5cm width x 0.3cm depth; 2.474cm^3 volume. Post debridement Stage noted as Unstageable/Unclassified. Character of Wound/Ulcer Post Debridement is stable. Post procedure Diagnosis Wound #1: Same as Pre-Procedure Wound #2 Pre-procedure diagnosis of Wound #2 is a Pressure Ulcer located on the Right Calcaneus . There was a Excisional Skin/Subcutaneous Tissue Debridement with a total area of 2.4 sq cm performed by STONE III, HOYT E., PA-C. With the following instrument(s): Curette to remove Viable and Non-Viable tissue/material. Material removed includes Subcutaneous Tissue, Slough, Skin: Dermis, Skin: Epidermis, and Biofilm after achieving pain control using Other (lidocaine 4%). No specimens were taken. A time out was conducted at 13:11, prior to the start of the procedure. A Minimum amount of bleeding was controlled with Pressure. The procedure was tolerated well with a pain level of 0 throughout and a pain level of 0 following the procedure. Patient s Level of Consciousness post procedure was recorded as Awake and Alert. Post Debridement Measurements: 1.6cm length x 1.5cm width x 0.2cm depth; 0.377cm^3 volume. Post debridement Stage noted as Unstageable/Unclassified. Character of Wound/Ulcer Post Debridement is stable. Post procedure Diagnosis Wound #2: Same as Pre-Procedure Plan Wound Cleansing: Wound #1  Left Calcaneus: Clean wound with Normal Saline. Cleanse wound with mild soap and water May Shower, gently pat wound dry prior to applying new dressing. Wound #2 Right Calcaneus: Clean wound with Normal Saline. Cleanse wound with mild soap and water May Shower, gently pat wound dry prior to applying new dressing. Anesthetic (add to Medication List): Wound #1 Left Calcaneus: Topical Lidocaine 4% cream applied to wound bed prior to debridement (In Clinic Only). Wound #2 Right  Calcaneus: Topical Lidocaine 4% cream applied to wound bed prior to debridement (In Clinic Only). Skin Barriers/Peri-Wound Care: Wound #1 Left Calcaneus: Skin Prep Wound #2 Right Calcaneus: Skin Prep Primary Wound Dressing: Wound #1 Left Calcaneus: Iodoflex Wound #2 Right Calcaneus: Silver Collagen Smartt, Willliam L. ([id number]) Secondary Dressing: Wound #1 Left Calcaneus: Dry Gauze Boardered Foam Dressing Wound #2 Right Calcaneus: Dry Gauze Boardered Foam Dressing Dressing Change Frequency: Wound #1 Left Calcaneus: Change dressing every other day. Wound #2 Right Calcaneus: Change dressing every other day. Follow-up Appointments: Wound #1 Left Calcaneus: Return Appointment in 1 week. Wound #2 Right Calcaneus: Return Appointment in 1 week. Off-Loading: Wound #1 Left Calcaneus: Heel suspension boot to: - Please order prevalon boots for bilateral feet Please float heel when pt is in the bed (NO PRESSURE ON HEELS) Turn and reposition every 2 hours Wound #2 Right Calcaneus: Heel suspension boot to: - Please order prevalon boots for bilateral feet Please float heel when pt is in the bed (NO PRESSURE ON HEELS) Turn and reposition every 2 hours Additional Orders / Instructions: Wound #1 Left Calcaneus: Increase protein intake. Wound #2 Right Calcaneus: Increase protein intake. Home Health: Wound #2 Right Calcaneus: D/C Home Health Services At this time I'm gonna suggest continue with the Iodoflex for the left heel. We can attempt to order this for the patient. Subsequently in regard to the right heel I'm gonna initiate Prisma as the dressing of choice I think this will be best for him. He's in agreement with this plan. We will subsequently see were things stand at follow-up. Please see above for specific wound care orders. We will see patient for re-evaluation in 1 week(s) here in the clinic. If anything worsens or changes patient will contact our office for additional  recommendations. Electronic Signature(s) Signed: 12/09/2017 9:07:56 PM By: Worthy Keeler PA-C Entered By: Worthy Keeler on 12/09/2017 13:38:29 Fischler, Dewel L. ([id number]) -------------------------------------------------------------------------------- ROS/PFSH Details Patient Name: ROTHGEB, Keyontay L. Date of Service: 12/09/2017 12:30 PM Medical Record Number: [id number] Patient Account Number: [id number] Date of Birth/Sex: [birth year] (84 y.o. M) Treating RN: Roger Shelter Primary Care Provider: Ramonita Lab Other Clinician: Referring Provider: Ramonita Lab Treating Provider/Extender: Melburn Hake, HOYT Weeks in Treatment: 16 Information Obtained From Patient Wound History Do you currently have one or more open woundso Yes How many open wounds do you currently haveo 2 Approximately how long have you had your woundso 1 week How have you been treating your wound(s) until nowo nothing Has your wound(s) ever healed and then re-openedo No Have you had any lab work done in the past montho No Have you tested positive for osteomyelitis (bone infection)o No Have you had any tests for circulation on your legso No Have you had other problems associated with your woundso Swelling Constitutional Symptoms (General Health) Complaints and Symptoms: Negative for: Fever; Chills Respiratory Complaints and Symptoms: No Complaints or Symptoms Cardiovascular Complaints and Symptoms: No Complaints or Symptoms Medical History: Positive for: Arrhythmia - a-fib; Hypertension Oncologic Medical History: Positive for: Received Radiation Psychiatric Complaints  and Symptoms: No Complaints or Symptoms Immunizations Pneumococcal Vaccine: Received Pneumococcal Vaccination: Yes Implantable Devices Family and Social History Brosseau, Jahmarion L. ([id number]) Cancer: No; Diabetes: No; Heart Disease: No; Hereditary Spherocytosis: No; Hypertension: No; Kidney Disease: No; Lung Disease: No; Seizures: No; Stroke:  No; Thyroid Problems: No; Tuberculosis: No; Former smoker; Marital Status - Married; Alcohol Use: Rarely - 2oz day; Drug Use: No History; Caffeine Use: Daily; Financial Concerns: No; Food, Clothing or Shelter Needs: No; Support System Lacking: No; Transportation Concerns: No; Advanced Directives: Yes (Not Provided); Patient does not want information on Advanced Directives; Do not resuscitate: No; Living Will: Yes (Not Provided); Medical Power of Attorney: No Physician Affirmation I have reviewed and agree with the above information. Electronic Signature(s) Signed: 12/09/2017 5:11:24 PM By: Roger Shelter Signed: 12/09/2017 9:07:56 PM By: Worthy Keeler PA-C Entered By: Worthy Keeler on 12/09/2017 13:24:08 Spalla, Tivoli ([id number]) -------------------------------------------------------------------------------- SuperBill Details Patient Name: CODNER, Elizandro L. Date of Service: 12/09/2017 Medical Record Number: [id number] Patient Account Number: [id number] Date of Birth/Sex: 19-Jan-1926 (84 y.o. M) Treating RN: Roger Shelter Primary Care Provider: Ramonita Lab Other Clinician: Referring Provider: Ramonita Lab Treating Provider/Extender: Melburn Hake, HOYT Weeks in Treatment: 16 Diagnosis Coding ICD-10 Codes Code Description [phone] Pressure ulcer of left heel, stage 3 L89.613 Pressure ulcer of right heel, stage 3 I48.2 Chronic atrial fibrillation I10 Essential (primary) hypertension Z79.01 Long term (current) use of anticoagulants Facility Procedures CPT4 Code: [id number] Description: 29476 - DEB SUBQ TISSUE 20 SQ CM/< ICD-10 Diagnosis Description L89.623 Pressure ulcer of left heel, stage 3 L89.613 Pressure ulcer of right heel, stage 3 Modifier: Quantity: 1 Physician Procedures CPT4 Code: 5465035 Description: 11042 - WC PHYS SUBQ TISS 20 SQ CM ICD-10 Diagnosis Description L89.623 Pressure ulcer of left heel, stage 3 L89.613 Pressure ulcer of right heel, stage 3 Modifier: Quantity:  1 Electronic Signature(s) Signed: 12/09/2017 9:07:56 PM By: Worthy Keeler PA-C Entered By: Worthy Keeler on 12/09/2017 13:39:03

## 2017-12-10 NOTE — Patient Outreach (Signed)
[name] [facility]) Care Management  12/10/2017  [name] Nov 25, 1925 [id number]  Telephone follow up call   84year old male, on 1/7 ED admission after fall at home noted pubic rami fracture, no surgical plan, WBAT on RLE.  Discharged to [facility] on 1/9, DC to home on 08/23/17.  PMH includes : Hypertension , chronic atrial fib, bilateral heel ulcers, PVD, osteoarthritis.   5/22 Referral from [facility] utilization management department :  To recommend intervention to clarify how to best the plan can can assist in meeting member needs.   Successful outreach call to patient, HIPAA information verified.  Patient reports that he is feeling great, discussed that he continues to go to wound clinic weekly of wound care.  He discussed that he believes that there is a problem with getting all the supplies for wound care that he needs at home, patient requested that I return call in the next hours to speak with his wife.   Unsuccessful call attempt to patient home, attempt call times 3, rechecked number,  received phone message phone has been changed, disconnected or no longer in service.   Plan  Will plan return call in this week due to being unable to make contact with return call to home to speak with wife.   Will address care plan goals at next call, due to being unable to complete with patient due to limited phone conversation and request to speak with his wife.   Joylene Draft, RN, [place] Management Coordinator  [phone]- Mobile [phone]- Toll Free Main Office

## 2017-12-12 ENCOUNTER — Other Ambulatory Visit: Payer: Self-pay | Admitting: *Deleted

## 2017-12-12 NOTE — Patient Outreach (Signed)
[name] [facility]) Care Management  12/12/2017  [name] 01-30-1926 [id number]   Telephone follow up call   84year old male, on 1/7 ED admission after fall at home noted pubic rami fracture, no surgical plan, WBAT on RLE.  Discharged to [facility] on 1/9, DC to home on 08/23/17.  PMH includes : Hypertension , chronic atrial fib, bilateral heel ulcers, PVD, osteoarthritis.   5/22 Referral from [facility] utilization management department : To recommend intervention to clarify how to best the plan can assist in meeting member needs.   Successful outreach call to patient, able to speak with patient wife HIPPA identifier verified. Wife reports patient tolerating mobility around home, using his walker.  Wife reports wound is healing slowly, and current plan is weekly visit to wound clinic . Wife reports she changes dressings daily, she reports that she has all the supplies for wound care. She states nurse at wound center ordered supplies from [employer] and items arrived just a few days later.   Wife denies any other concerns at this time, agreeable to home visit.   Plan  Will plan home visit in the next 2 weeks for assessment and consider case closure as goals met.     Joylene Draft, RN, [place] Management Coordinator  [phone]- Mobile [phone]- Toll Free Main Office

## 2017-12-16 ENCOUNTER — Encounter: Payer: PPO | Admitting: Physician Assistant

## 2017-12-16 DIAGNOSIS — L89613 Pressure ulcer of right heel, stage 3: Secondary | ICD-10-CM | POA: Diagnosis not present

## 2017-12-16 DIAGNOSIS — L89623 Pressure ulcer of left heel, stage 3: Secondary | ICD-10-CM | POA: Diagnosis not present

## 2017-12-17 DIAGNOSIS — E559 Vitamin D deficiency, unspecified: Secondary | ICD-10-CM | POA: Diagnosis not present

## 2017-12-17 DIAGNOSIS — L89620 Pressure ulcer of left heel, unstageable: Secondary | ICD-10-CM | POA: Diagnosis not present

## 2017-12-17 DIAGNOSIS — S32511D Fracture of superior rim of right pubis, subsequent encounter for fracture with routine healing: Secondary | ICD-10-CM | POA: Diagnosis not present

## 2017-12-17 DIAGNOSIS — I482 Chronic atrial fibrillation: Secondary | ICD-10-CM | POA: Diagnosis not present

## 2017-12-17 DIAGNOSIS — I10 Essential (primary) hypertension: Secondary | ICD-10-CM | POA: Diagnosis not present

## 2017-12-17 DIAGNOSIS — K219 Gastro-esophageal reflux disease without esophagitis: Secondary | ICD-10-CM | POA: Diagnosis not present

## 2017-12-17 DIAGNOSIS — L89610 Pressure ulcer of right heel, unstageable: Secondary | ICD-10-CM | POA: Diagnosis not present

## 2017-12-17 DIAGNOSIS — Z8546 Personal history of malignant neoplasm of prostate: Secondary | ICD-10-CM | POA: Diagnosis not present

## 2017-12-19 NOTE — Progress Notes (Signed)
Ozanich, Atanacio L. ([id number]) Visit Report for 12/16/2017 Arrival Information Details Patient Name: [name], [name] Date of Service: 12/16/2017 12:30 PM Medical Record Number: [id number] Patient Account Number: [id number] Date of Birth/Sex: [birth year] (84 y.o. M) Treating RN: Montey Hora Primary Care Areyanna Figeroa: Ramonita Lab Other Clinician: Referring Keyry Iracheta: Ramonita Lab Treating Adley Castello/Extender: Melburn Hake, HOYT Weeks in Treatment: 34 Visit Information History Since Last Visit Added or deleted any medications: No Patient Arrived: Walker Any new allergies or adverse reactions: No Arrival Time: 12:42 Had a fall or experienced change in No Accompanied By: spouse activities of daily living that may affect Transfer Assistance: None risk of falls: Patient Identification Verified: Yes Signs or symptoms of abuse/neglect since last visito No Secondary Verification Process Yes Hospitalized since last visit: No Completed: Implantable device outside of the clinic excluding No Patient Requires Transmission-Based No cellular tissue based products placed in the center Precautions: since last visit: Patient Has Alerts: Yes Has Dressing in Place as Prescribed: Yes Patient Alerts: Patient on Blood Pain Present Now: No Thinner Warfarin Electronic Signature(s) Signed: 12/16/2017 5:19:07 PM By: Montey Hora Entered By: Montey Hora on 12/16/2017 12:43:19 Brazzel, Obbie L. ([id number]) -------------------------------------------------------------------------------- Encounter Discharge Information Details Patient Name: [name], [name]. Date of Service: 12/16/2017 12:30 PM Medical Record Number: [id number] Patient Account Number: [id number] Date of Birth/Sex: [birth year] (84 y.o. M) Treating RN: Cornell Barman Primary Care Zavien Clubb: Ramonita Lab Other Clinician: Referring Izola Teague: Ramonita Lab Treating Laryah Neuser/Extender: Melburn Hake, HOYT Weeks in Treatment: 17 Encounter Discharge Information  Items Discharge Condition: Stable Ambulatory Status: Walker Discharge Destination: Home Transportation: Private Auto Accompanied By: wife Schedule Follow-up Appointment: Yes Clinical Summary of Care: Electronic Signature(s) Signed: 12/16/2017 1:33:09 PM By: Gretta Cool, BSN, RN, CWS, Kim RN, BSN Entered By: Gretta Cool, BSN, RN, CWS, Kim on 12/16/2017 13:33:09 Zappone, Sircharles L. ([id number]) -------------------------------------------------------------------------------- Lower Extremity Assessment Details Patient Name: [name], [name] Date of Service: 12/16/2017 12:30 PM Medical Record Number: [id number] Patient Account Number: [id number] Date of Birth/Sex: Mar 13, 1926 (84 y.o. M) Treating RN: Montey Hora Primary Care Adriane Guglielmo: Ramonita Lab Other Clinician: Referring Vivien Barretto: Ramonita Lab Treating Latronda Spink/Extender: Melburn Hake, HOYT Weeks in Treatment: 17 Vascular Assessment Pulses: Dorsalis Pedis Palpable: [Left:Yes] [Right:Yes] Posterior Tibial Extremity colors, hair growth, and conditions: Extremity Color: [Left:Normal] [Right:Normal] Temperature of Extremity: [Left:Warm] [Right:Warm] Capillary Refill: [Left:< 3 seconds] [Right:< 3 seconds] Toe Nail Assessment Left: Right: Thick: Yes Yes Discolored: Yes Yes Deformed: Yes Yes Improper Length and Hygiene: No No Electronic Signature(s) Signed: 12/16/2017 5:19:07 PM By: Montey Hora Entered By: Montey Hora on 12/16/2017 12:50:28 Brecheisen, Jatavious L. ([id number]) -------------------------------------------------------------------------------- Multi Wound Chart Details Patient Name: [name], [name] Date of Service: 12/16/2017 12:30 PM Medical Record Number: [id number] Patient Account Number: [id number] Date of Birth/Sex: 1925/10/14 (84 y.o. M) Treating RN: Roger Shelter Primary Care Jonel Weldon: Ramonita Lab Other Clinician: Referring Anicka Stuckert: Ramonita Lab Treating Genesee Nase/Extender: Melburn Hake, HOYT Weeks in Treatment: 17 Vital  Signs Height(in): 70 Pulse(bpm): 70 Weight(lbs): 190 Blood Pressure(mmHg): 118/55 Body Mass Index(BMI): 27 Temperature(F): 97.7 Respiratory Rate 16 (breaths/min): Photos: [1:No Photos] [2:No Photos] [N/A:N/A] Wound Location: [1:Left Calcaneus] [2:Right Calcaneus] [N/A:N/A] Wounding Event: [1:Pressure Injury] [2:Pressure Injury] [N/A:N/A] Primary Etiology: [1:Pressure Ulcer] [2:Pressure Ulcer] [N/A:N/A] Comorbid History: [1:Arrhythmia, Hypertension, Received Radiation] [2:Arrhythmia, Hypertension, Received Radiation] [N/A:N/A] Date Acquired: [1:08/12/2017] [2:08/12/2017] [N/A:N/A] Weeks of Treatment: [1:17] [2:17] [N/A:N/A] Wound Status: [1:Open] [2:Open] [N/A:N/A] Measurements L x W x D [1:2.9x3x0.2] [2:1.4x1.7x0.1] [N/A:N/A] (cm) Area (cm) : [1:6.833] [2:1.869] [N/A:N/A] Volume (cm) : [1:1.367] [2:0.187] [N/A:N/A] % Reduction in Area: [1:70.10%] [2:83.70%] [N/A:N/A] % Reduction  in Volume: [1:70.10%] [2:91.90%] [N/A:N/A] Classification: [1:Category/Stage III] [2:Category/Stage III] [N/A:N/A] Exudate Amount: [1:Small] [2:Small] [N/A:N/A] Exudate Type: [1:Serosanguineous] [2:N/A] [N/A:N/A] Exudate Color: [1:red, brown] [2:N/A] [N/A:N/A] Wound Margin: [1:Distinct, outline attached] [2:Distinct, outline attached] [N/A:N/A] Granulation Amount: [1:Small (1-33%)] [2:Small (1-33%)] [N/A:N/A] Granulation Quality: [1:Pink] [2:Red, Pink] [N/A:N/A] Necrotic Amount: [1:Large (67-100%)] [2:Large (67-100%)] [N/A:N/A] Exposed Structures: [1:Fat Layer (Subcutaneous Tissue) Exposed: Yes Fascia: No Tendon: No Muscle: No Joint: No Bone: No] [2:Fat Layer (Subcutaneous Tissue) Exposed: Yes Fascia: No Tendon: No Muscle: No Joint: No Bone: No] [N/A:N/A] Epithelialization: [1:None] [2:None] [N/A:N/A] Periwound Skin Texture: [1:Excoriation: Yes Induration: No Callus: No Crepitus: No Rash: No Scarring: No] [2:No Abnormalities Noted] [N/A:N/A] Periwound Skin Moisture: No Abnormalities Noted No  Abnormalities Noted N/A Periwound Skin Color: No Abnormalities Noted No Abnormalities Noted N/A Tenderness on Palpation: No No N/A Wound Preparation: Ulcer Cleansing: Ulcer Cleansing: N/A Rinsed/Irrigated with Saline Rinsed/Irrigated with Saline Topical Anesthetic Applied: Topical Anesthetic Applied: Other: lidocaine 4% Other: lidocaine 4% Treatment Notes Electronic Signature(s) Signed: 12/17/2017 11:44:07 AM By: Roger Shelter Entered By: Roger Shelter on 12/16/2017 12:55:42 Cerney, Kou L. ([id number]) -------------------------------------------------------------------------------- [place] Details Patient Name: SRIKAR, CHIANG. Date of Service: 12/16/2017 12:30 PM Medical Record Number: [id number] Patient Account Number: [id number] Date of Birth/Sex: [birth year] (84 y.o. M) Treating RN: Roger Shelter Primary Care Elah Avellino: Ramonita Lab Other Clinician: Referring Roslind Michaux: Ramonita Lab Treating Khalif Stender/Extender: Melburn Hake, HOYT Weeks in Treatment: 17 Active Inactive ` Abuse / Safety / Falls / Self Care Management Nursing Diagnoses: History of Falls Potential for falls Goals: Patient will not experience any injury related to falls Date Initiated: 08/19/2017 Target Resolution Date: 12/14/2017 Goal Status: Active Interventions: Assess Activities of Daily Living upon admission and as needed Assess fall risk on admission and as needed Assess: immobility, friction, shearing, incontinence upon admission and as needed Assess impairment of mobility on admission and as needed per policy Assess personal safety and home safety (as indicated) on admission and as needed Assess self care needs on admission and as needed Notes: ` Nutrition Nursing Diagnoses: Imbalanced nutrition Potential for alteratiion in Nutrition/Potential for imbalanced nutrition Goals: Patient/caregiver agrees to and verbalizes understanding of need to use nutritional supplements and/or  vitamins as prescribed Date Initiated: 08/19/2017 Target Resolution Date: 11/16/2017 Goal Status: Active Interventions: Assess patient nutrition upon admission and as needed per policy Notes: ` Orientation to the Wound Care Program Nursing Diagnoses: Mccarroll, Key L. ([id number]) Knowledge deficit related to the wound healing center program Goals: Patient/caregiver will verbalize understanding of the [facility] Date Initiated: 08/19/2017 Target Resolution Date: 09/14/2017 Goal Status: Active Interventions: Provide education on orientation to the wound center Notes: ` Pain, Acute or Chronic Nursing Diagnoses: Pain, acute or chronic: actual or potential Potential alteration in comfort, pain Goals: Patient/caregiver will verbalize adequate pain control between visits Date Initiated: 08/19/2017 Target Resolution Date: 12/14/2017 Goal Status: Active Interventions: Complete pain assessment as per visit requirements Notes: ` Pressure Nursing Diagnoses: Knowledge deficit related to causes and risk factors for pressure ulcer development Knowledge deficit related to management of pressures ulcers Potential for impaired tissue integrity related to pressure, friction, moisture, and shear Goals: Patient will remain free from development of additional pressure ulcers Date Initiated: 08/19/2017 Target Resolution Date: 12/14/2017 Goal Status: Active Interventions: Assess: immobility, friction, shearing, incontinence upon admission and as needed Assess potential for pressure ulcer upon admission and as needed Notes: ` Wound/Skin Impairment Nursing Diagnoses: Impaired tissue integrity Knowledge deficit related to ulceration/compromised skin integrity Kowalczyk, Hollie L. ([id number]) Goals: Ulcer/skin  breakdown will have a volume reduction of 80% by week 12 Date Initiated: 08/19/2017 Target Resolution Date: 12/14/2017 Goal Status: Active Interventions: Assess  patient/caregiver ability to perform ulcer/skin care regimen upon admission and as needed Assess ulceration(s) every visit Notes: Electronic Signature(s) Signed: 12/17/2017 11:44:07 AM By: Roger Shelter Entered By: Roger Shelter on 12/16/2017 12:55:32 Garate, Paula L. ([id number]) -------------------------------------------------------------------------------- Pain Assessment Details Patient Name: BOEHLE, Theon L. Date of Service: 12/16/2017 12:30 PM Medical Record Number: [id number] Patient Account Number: [id number] Date of Birth/Sex: [birth year] (84 y.o. M) Treating RN: Montey Hora Primary Care Shuronda Santino: Ramonita Lab Other Clinician: Referring Alexy Heldt: Ramonita Lab Treating Raevon Broom/Extender: Melburn Hake, HOYT Weeks in Treatment: 17 Active Problems Location of Pain Severity and Description of Pain Patient Has Paino No Site Locations Pain Management and Medication Current Pain Management: Electronic Signature(s) Signed: 12/16/2017 5:19:07 PM By: Montey Hora Entered By: Montey Hora on 12/16/2017 12:43:26 Rickert, Raydan L. ([id number]) -------------------------------------------------------------------------------- Patient/Caregiver Education Details Patient Name: NIVAN, MELENDREZ L. Date of Service: 12/16/2017 12:30 PM Medical Record Number: [id number] Patient Account Number: [id number] Date of Birth/Gender: 08/20/1925 (84 y.o. M) Treating RN: Cornell Barman Primary Care Physician: Ramonita Lab Other Clinician: Referring Physician: Ramonita Lab Treating Physician/Extender: Sharalyn Ink in Treatment: 17 Education Assessment Education Provided To: Patient Education Topics Provided Offloading: Handouts: What is Offloadingo, Other: continue using heel off-loading shoe Methods: Explain/Verbal Responses: State content correctly Wound/Skin Impairment: Handouts: Caring for Your Ulcer, Other: continue wound care as prescribed Methods: Demonstration,  Explain/Verbal Responses: State content correctly Electronic Signature(s) Signed: 12/17/2017 3:21:59 PM By: Gretta Cool, BSN, RN, CWS, Kim RN, BSN Entered By: Gretta Cool, BSN, RN, CWS, Kim on 12/16/2017 13:34:05 Kroening, Sincere L. ([id number]) -------------------------------------------------------------------------------- Wound Assessment Details Patient Name: NICOTRA, Ahkeem L. Date of Service: 12/16/2017 12:30 PM Medical Record Number: [id number] Patient Account Number: [id number] Date of Birth/Sex: [birth year] (84 y.o. M) Treating RN: Montey Hora Primary Care Jamyrah Saur: Ramonita Lab Other Clinician: Referring Devi Hopman: Ramonita Lab Treating Jamariah Tony/Extender: Melburn Hake, HOYT Weeks in Treatment: 17 Wound Status Wound Number: 1 Primary Etiology: Pressure Ulcer Wound Location: Left Calcaneus Wound Status: Open Wounding Event: Pressure Injury Comorbid Arrhythmia, Hypertension, Received History: Radiation Date Acquired: 08/12/2017 Weeks Of Treatment: 17 Clustered Wound: No Photos Photo Uploaded By: Montey Hora on 12/16/2017 15:39:25 Wound Measurements Length: (cm) 2.9 Width: (cm) 3 Depth: (cm) 0.2 Area: (cm) 6.833 Volume: (cm) 1.367 % Reduction in Area: 70.1% % Reduction in Volume: 70.1% Epithelialization: None Tunneling: No Undermining: No Wound Description Classification: Category/Stage III Wound Margin: Distinct, outline attached Exudate Amount: Small Exudate Type: Serosanguineous Exudate Color: red, brown Foul Odor After Cleansing: No Slough/Fibrino Yes Wound Bed Granulation Amount: Small (1-33%) Exposed Structure Granulation Quality: Pink Fascia Exposed: No Necrotic Amount: Large (67-100%) Fat Layer (Subcutaneous Tissue) Exposed: Yes Necrotic Quality: Adherent Slough Tendon Exposed: No Muscle Exposed: No Joint Exposed: No Bone Exposed: No Periwound Skin Texture Sudol, Robb L. ([id number]) Texture Color No Abnormalities Noted: No No Abnormalities Noted:  Yes Callus: No Crepitus: No Excoriation: Yes Induration: No Rash: No Scarring: No Moisture No Abnormalities Noted: No Wound Preparation Ulcer Cleansing: Rinsed/Irrigated with Saline Topical Anesthetic Applied: Other: lidocaine 4%, Treatment Notes Wound #1 (Left Calcaneus) 1. Cleansed with: Clean wound with Normal Saline 2. Anesthetic Topical Lidocaine 4% cream to wound bed prior to debridement 4. Dressing Applied: Iodoflex 5. Secondary Dressing Applied Dry Gauze 6. Footwear/Offloading device applied Other footwear/offloading device applied (specify in notes) Notes Bordered foam dressing; heel off-loader Electronic Signature(s) Signed: 12/16/2017 5:19:07 PM By: Montey Hora Entered By: Montey Hora  on 12/16/2017 12:48:35 Molchan, Kalik L. ([id number]) -------------------------------------------------------------------------------- Wound Assessment Details Patient Name: MATSUMURA, Ryder L. Date of Service: 12/16/2017 12:30 PM Medical Record Number: [id number] Patient Account Number: [id number] Date of Birth/Sex: 16-Mar-1926 (84 y.o. M) Treating RN: Montey Hora Primary Care Keymiah Lyles: Ramonita Lab Other Clinician: Referring Ruhama Lehew: Ramonita Lab Treating Tauheedah Bok/Extender: Melburn Hake, HOYT Weeks in Treatment: 17 Wound Status Wound Number: 2 Primary Etiology: Pressure Ulcer Wound Location: Right Calcaneus Wound Status: Open Wounding Event: Pressure Injury Comorbid Arrhythmia, Hypertension, Received History: Radiation Date Acquired: 08/12/2017 Weeks Of Treatment: 17 Clustered Wound: No Photos Photo Uploaded By: Montey Hora on 12/16/2017 15:39:44 Wound Measurements Length: (cm) 1.4 Width: (cm) 1.7 Depth: (cm) 0.1 Area: (cm) 1.869 Volume: (cm) 0.187 % Reduction in Area: 83.7% % Reduction in Volume: 91.9% Epithelialization: None Tunneling: No Undermining: No Wound Description Classification: Category/Stage III Wound Margin: Distinct, outline  attached Exudate Amount: Small Foul Odor After Cleansing: No Slough/Fibrino Yes Wound Bed Granulation Amount: Small (1-33%) Exposed Structure Granulation Quality: Red, Pink Fascia Exposed: No Necrotic Amount: Large (67-100%) Fat Layer (Subcutaneous Tissue) Exposed: Yes Necrotic Quality: Adherent Slough Tendon Exposed: No Muscle Exposed: No Joint Exposed: No Bone Exposed: No Periwound Skin Texture Texture Color No Abnormalities Noted: No No Abnormalities Noted: No Schwartzkopf, Benton ([id number]) Moisture No Abnormalities Noted: No Wound Preparation Ulcer Cleansing: Rinsed/Irrigated with Saline Topical Anesthetic Applied: Other: lidocaine 4%, Treatment Notes Wound #2 (Right Calcaneus) 1. Cleansed with: Clean wound with Normal Saline 2. Anesthetic Topical Lidocaine 4% cream to wound bed prior to debridement 4. Dressing Applied: Prisma Ag 5. Secondary Dressing Applied Bordered Foam Dressing Dry Gauze Notes bordered foam dressing and heel off-loader Electronic Signature(s) Signed: 12/16/2017 5:19:07 PM By: Montey Hora Entered By: Montey Hora on 12/16/2017 12:49:35 Point, Cassey L. ([id number]) -------------------------------------------------------------------------------- Vitals Details Patient Name: THEBEAU, Ausencio L. Date of Service: 12/16/2017 12:30 PM Medical Record Number: [id number] Patient Account Number: [id number] Date of Birth/Sex: [birth year] (84 y.o. M) Treating RN: Montey Hora Primary Care Cannan Beeck: Ramonita Lab Other Clinician: Referring Euriah Matlack: Ramonita Lab Treating Jayziah Bankhead/Extender: Melburn Hake, HOYT Weeks in Treatment: 17 Vital Signs Time Taken: 12:43 Temperature (F): 97.7 Height (in): 70 Pulse (bpm): 70 Weight (lbs): 190 Respiratory Rate (breaths/min): 16 Body Mass Index (BMI): 27.3 Blood Pressure (mmHg): 118/55 Reference Range: 80 - 120 mg / dl Electronic Signature(s) Signed: 12/16/2017 5:19:07 PM By: Montey Hora Entered By: Montey Hora on 12/16/2017 12:44:44

## 2017-12-19 NOTE — Progress Notes (Signed)
[name], [name] ([id number]) Visit Report for 12/16/2017 Chief Complaint Document Details Patient Name: [name], [name] Date of Service: 12/16/2017 12:30 PM Medical Record Number: [id number] Patient Account Number: [id number] Date of Birth/Sex: 01/28/1926 (84 y.o. M) Treating RN: Primary Care Provider: Ramonita Lab Other Clinician: Referring Provider: Ramonita Lab Treating Provider/Extender: Melburn Hake, HOYT Weeks in Treatment: 17 Information Obtained from: Patient Chief Complaint Bilateral Heel pressure ulcers and bilateral great toe DTIs Electronic Signature(s) Signed: 12/16/2017 11:45:54 PM By: Worthy Keeler PA-C Entered By: Worthy Keeler on 12/16/2017 12:54:27 Battie, Denny L. ([id number]) -------------------------------------------------------------------------------- Debridement Details Patient Name: [name], [name] Date of Service: 12/16/2017 12:30 PM Medical Record Number: [id number] Patient Account Number: [id number] Date of Birth/Sex: 1926/05/21 (84 y.o. M) Treating RN: Roger Shelter Primary Care Provider: Ramonita Lab Other Clinician: Referring Provider: Ramonita Lab Treating Provider/Extender: Melburn Hake, HOYT Weeks in Treatment: 17 Debridement Performed for Wound #2 Right Calcaneus Assessment: Performed By: Physician STONE III, HOYT E., PA-C Debridement Type: Debridement Pre-procedure Verification/Time Yes - 12:58 Out Taken: Start Time: 12:58 Pain Control: Other : lidocaine 4% Total Area Debrided (L x W): 1.4 (cm) x 1.7 (cm) = 2.38 (cm) Tissue and other material Viable, Non-Viable, Slough, Subcutaneous, Biofilm, Slough debrided: Level: Skin/Subcutaneous Tissue Debridement Description: Excisional Instrument: Curette Bleeding: Minimum Hemostasis Achieved: Pressure End Time: 12:59 Procedural Pain: 0 Post Procedural Pain: 0 Response to Treatment: Procedure was tolerated well Level of Consciousness: Awake and Alert Post Procedure Vitals: Temperature:  97.7 Pulse: 70 Respiratory Rate: 16 Blood Pressure: Systolic Blood Pressure: 017 Diastolic Blood Pressure: 55 Post Debridement Measurements of Total Wound Length: (cm) 1.4 Stage: Category/Stage III Width: (cm) 1.7 Depth: (cm) 0.2 Volume: (cm) 0.374 Character of Wound/Ulcer Post Stable Debridement: Post Procedure Diagnosis Same as Pre-procedure Electronic Signature(s) Signed: 12/16/2017 11:45:54 PM By: Worthy Keeler PA-C Signed: 12/17/2017 11:44:07 AM By: Roger Shelter Entered By: Roger Shelter on 12/16/2017 12:59:56 Gallier, Davone L. ([id number]) Beddow, [place] ([id number]) -------------------------------------------------------------------------------- Debridement Details Patient Name: [name], [name] Date of Service: 12/16/2017 12:30 PM Medical Record Number: [id number] Patient Account Number: [id number] Date of Birth/Sex: April 08, 1926 (84 y.o. M) Treating RN: Roger Shelter Primary Care Provider: Ramonita Lab Other Clinician: Referring Provider: Ramonita Lab Treating Provider/Extender: Melburn Hake, HOYT Weeks in Treatment: 17 Debridement Performed for Wound #1 Left Calcaneus Assessment: Performed By: Physician STONE III, HOYT E., PA-C Debridement Type: Debridement Pre-procedure Verification/Time Yes - 12:58 Out Taken: Start Time: 12:58 Pain Control: Other : lidocaine 4% Total Area Debrided (L x W): 2.9 (cm) x 3 (cm) = 8.7 (cm) Tissue and other material Viable, Non-Viable, Slough, Subcutaneous, Biofilm, Slough debrided: Level: Skin/Subcutaneous Tissue Debridement Description: Excisional Instrument: Curette Bleeding: Minimum Hemostasis Achieved: Pressure End Time: 12:59 Procedural Pain: 0 Post Procedural Pain: 0 Response to Treatment: Procedure was tolerated well Level of Consciousness: Awake and Alert Post Procedure Vitals: Temperature: 97.7 Pulse: 70 Respiratory Rate: 16 Blood Pressure: Systolic Blood Pressure: 390 Diastolic Blood Pressure:  55 Post Debridement Measurements of Total Wound Length: (cm) 2.9 Stage: Category/Stage III Width: (cm) 3 Depth: (cm) 0.3 Volume: (cm) 2.05 Character of Wound/Ulcer Post Stable Debridement: Post Procedure Diagnosis Same as Pre-procedure Electronic Signature(s) Signed: 12/16/2017 11:45:54 PM By: Worthy Keeler PA-C Signed: 12/17/2017 11:44:07 AM By: Roger Shelter Entered By: Roger Shelter on 12/16/2017 13:00:38 [place], [place] ([id number]) Hiegel, [place] ([id number]) -------------------------------------------------------------------------------- HPI Details Patient Name: [name], [name] Date of Service: 12/16/2017 12:30 PM Medical Record Number: [id number] Patient Account Number: [id number] Date of Birth/Sex: August 25, 1925 (84 y.o. M) Treating  RN: Primary Care Provider: Ramonita Lab Other Clinician: Referring Provider: Ramonita Lab Treating Provider/Extender: Melburn Hake, HOYT Weeks in Treatment: 17 History of Present Illness Associated Signs and Symptoms: Patient has a history of chronic atrophic relation, hypertension, and long-term use of anticoagulants. He also had a fall with pelvic fracture which occurred on January 2019. HPI Description: 08/19/17 patient presents today for initial evaluation and our clinic concerning issues he has been having with the bilateral heels as well as the lateral great toe was since he was admitted to the nursing facility following a pelvic fracture. Initially he was spending a significant amount of time in the bed although he is now up more often since the healing process has progressed along the way obviously. When he is up he's spending most of the time sitting in a wheelchair at this point. When he is in bed he lays on his back and the covers/sheets are pushing down on his toes bilaterally which I think it may be what's causing the issues with his deep tissue injury to the bilateral great toes. With that being said the hills also appear to be  pressure in nature and I think that laying in the bed getting pressure to the sites is what has led to the formation of these on stage will pressure ulcers. Fortunately there does not appear to be any infection in both areas of ulceration of the heel appear to be stable at this point. Patient does not have any significant pain at the site she does have some of the eschar along the edges which is starting to lift up and come all hopefully slowly but surely this will occur. Patient did have arterial studies performed April 2018 which showed a normal TBI on the right knee slightly depressed TBI on the left but this does not appear to have changed significantly at that point. Specifically this was 0.75 on the right and 0.61 on the left. 09/13/17 on evaluation today patient presents for a follow-up evaluation regarding his bilateral heal ulcers. He has been tolerating the dressing changes fairly well although he did have some bleeding earlier today when it appears some of the skin on the surrounding portion of the wound got pulled where this is starting to flake off. Subsequently there does not appear to be any bleeding right now. Since I last saw him he has been discharged from the facility and is now back at home. His wife is having a very difficult time being able to get him up and in the car in order to come for an appointment which is why they missed the last appointment have not been able to come back sooner. Fortunately the he does not have any signs of infection. No fevers chills noted. He does have difficulty walking due to pain in the bilateral heels. 10/11/17 on evaluation today patient presents for follow-up evaluation concerning his ongoing bilateral heal ulcers. He has been tolerating the dressing changes which were Betadine paints daily without complication. I have not seen him since September 13, 2017. Pressure that I saw him August 19, 2017. Subsequently those are the only prior visits before  today. His left heel actually seems to have a significant amount of eschar that is starting to lift up at this point and seems to be a little more loose as far as drainage building up underneath the eschar. The right eschar though there are some parts lifting up seems to still be fairly stable which is good news. His toe ulcer  is almost completely healed a lot of that eschar came off and it looks very well underneath. 12/16/17 on evaluation today patient appears to be doing extremely well in regard to his bilateral heal ulcers. His right heel shows good signs of improvement with good epithelialization. Overall the Prisma seems to be of benefit for him. In regard to the left heel he is showing more granulation tissue than previously noted obviously this is what we want to see. This also appears to be more firm and solid than previously noted. Nonetheless I still think that we should utilize the Iodoflex for a time more at this point. Overall however I'm extremely pleased with his progress. Electronic Signature(s) Signed: 12/16/2017 11:45:54 PM By: Worthy Keeler PA-C Entered By: Worthy Keeler on 12/16/2017 13:07:15 Gatlin, Keithan L. ([id number]) -------------------------------------------------------------------------------- Physical Exam Details Patient Name: Perlow, Owynn L. Date of Service: 12/16/2017 12:30 PM Medical Record Number: [id number] Patient Account Number: [id number] Date of Birth/Sex: 06-02-1926 (84 y.o. M) Treating RN: Primary Care Provider: Ramonita Lab Other Clinician: Referring Provider: Ramonita Lab Treating Provider/Extender: STONE III, HOYT Weeks in Treatment: 46 Constitutional Well-nourished and well-hydrated in no acute distress. Respiratory normal breathing without difficulty. Psychiatric this patient is able to make decisions and demonstrates good insight into disease process. Alert and Oriented x 3. pleasant and cooperative. Notes Patient's wounds did both require  sharp debridement today and he tolerated this without complication. Post debridement the patient especially in regard to the right heel showed signs of an excellent wound bed. The left heel is actually doing much better as well although again it's a little bit more difficult to gauge this as he's had so much necrotic tissue it's more of a visual difference as well is a feeling and how the granulation tissue appears. In general I really think though he is doing excellent at this point fortunately he did get his supplies. Electronic Signature(s) Signed: 12/16/2017 11:45:54 PM By: Worthy Keeler PA-C Entered By: Worthy Keeler on 12/16/2017 13:08:07 Ybarra, [place] ([id number]) -------------------------------------------------------------------------------- Physician Orders Details Patient Name: KARDER, GOODIN L. Date of Service: 12/16/2017 12:30 PM Medical Record Number: [id number] Patient Account Number: [id number] Date of Birth/Sex: 04/12/1926 (84 y.o. M) Treating RN: Roger Shelter Primary Care Provider: Ramonita Lab Other Clinician: Referring Provider: Ramonita Lab Treating Provider/Extender: Melburn Hake, HOYT Weeks in Treatment: 37 Verbal / Phone Orders: No Diagnosis Coding ICD-10 Coding Code Description L89.623 Pressure ulcer of left heel, stage 3 L89.613 Pressure ulcer of right heel, stage 3 I48.2 Chronic atrial fibrillation I10 Essential (primary) hypertension Z79.01 Long term (current) use of anticoagulants Wound Cleansing Wound #1 Left Calcaneus o Clean wound with Normal Saline. o Cleanse wound with mild soap and water o May Shower, gently pat wound dry prior to applying new dressing. Wound #2 Right Calcaneus o Clean wound with Normal Saline. o Cleanse wound with mild soap and water o May Shower, gently pat wound dry prior to applying new dressing. Anesthetic (add to Medication List) Wound #1 Left Calcaneus o Topical Lidocaine 4% cream applied to wound bed  prior to debridement (In Clinic Only). Wound #2 Right Calcaneus o Topical Lidocaine 4% cream applied to wound bed prior to debridement (In Clinic Only). Skin Barriers/Peri-Wound Care Wound #1 Left Calcaneus o Skin Prep Wound #2 Right Calcaneus o Skin Prep Primary Wound Dressing Wound #1 Left Calcaneus o Iodoflex Wound #2 Right Calcaneus o Silver Collagen Secondary Dressing Wound #1 Left Calcaneus Stanke, Wyndell L. ([id number]) o Dry Gauze o Boardered Foam Dressing  Wound #2 Right Calcaneus o Dry Gauze o Boardered Foam Dressing Dressing Change Frequency Wound #1 Left Calcaneus o Change dressing every other day. Wound #2 Right Calcaneus o Change dressing every other day. Follow-up Appointments Wound #1 Left Calcaneus o Return Appointment in 1 week. Wound #2 Right Calcaneus o Return Appointment in 1 week. Off-Loading Wound #1 Left Calcaneus o Heel suspension boot to: - Please order prevalon boots for bilateral feet Please float heel when pt is in the bed (NO PRESSURE ON HEELS) o Turn and reposition every 2 hours Wound #2 Right Calcaneus o Heel suspension boot to: - Please order prevalon boots for bilateral feet Please float heel when pt is in the bed (NO PRESSURE ON HEELS) o Turn and reposition every 2 hours Additional Orders / Instructions Wound #1 Left Calcaneus o Increase protein intake. Wound #2 Right Calcaneus o Increase protein intake. Home Health Wound #1 Left Calcaneus o D/C Home Health Services Wound #2 Right Calcaneus o D/C Home Health Services Electronic Signature(s) Signed: 12/16/2017 11:45:54 PM By: Worthy Keeler PA-C Signed: 12/17/2017 11:44:07 AM By: Roger Shelter Entered By: Roger Shelter on 12/16/2017 13:08:13 Laker, [place] ([id number]) -------------------------------------------------------------------------------- Problem List Details Patient Name: SASSONE, Luqman L. Date of Service: 12/16/2017  12:30 PM Medical Record Number: [id number] Patient Account Number: [id number] Date of Birth/Sex: 1926/01/12 (84 y.o. M) Treating RN: Primary Care Provider: Ramonita Lab Other Clinician: Referring Provider: Ramonita Lab Treating Provider/Extender: Melburn Hake, HOYT Weeks in Treatment: 17 Active Problems ICD-10 Impacting Encounter Code Description Active Date Wound Healing Diagnosis L89.623 Pressure ulcer of left heel, stage 3 08/19/2017 No Yes L89.613 Pressure ulcer of right heel, stage 3 08/19/2017 No Yes I48.2 Chronic atrial fibrillation 08/19/2017 No Yes I10 Essential (primary) hypertension 08/19/2017 No Yes Z79.01 Long term (current) use of anticoagulants 08/19/2017 No Yes Inactive Problems Resolved Problems Electronic Signature(s) Signed: 12/16/2017 11:45:54 PM By: Worthy Keeler PA-C Entered By: Worthy Keeler on 12/16/2017 12:54:18 Pereira, [place] ([id number]) -------------------------------------------------------------------------------- Progress Note Details Patient Name: Ang, Dayan L. Date of Service: 12/16/2017 12:30 PM Medical Record Number: [id number] Patient Account Number: [id number] Date of Birth/Sex: 1925/11/28 (84 y.o. M) Treating RN: Primary Care Provider: Ramonita Lab Other Clinician: Referring Provider: Ramonita Lab Treating Provider/Extender: Melburn Hake, HOYT Weeks in Treatment: 17 Subjective Chief Complaint Information obtained from Patient Bilateral Heel pressure ulcers and bilateral great toe DTIs History of Present Illness (HPI) The following HPI elements were documented for the patient's wound: Associated Signs and Symptoms: Patient has a history of chronic atrophic relation, hypertension, and long-term use of anticoagulants. He also had a fall with pelvic fracture which occurred on January 2019. 08/19/17 patient presents today for initial evaluation and our clinic concerning issues he has been having with the bilateral heels as well as the lateral great toe was  since he was admitted to the nursing facility following a pelvic fracture. Initially he was spending a significant amount of time in the bed although he is now up more often since the healing process has progressed along the way obviously. When he is up he's spending most of the time sitting in a wheelchair at this point. When he is in bed he lays on his back and the covers/sheets are pushing down on his toes bilaterally which I think it may be what's causing the issues with his deep tissue injury to the bilateral great toes. With that being said the hills also appear to be pressure in nature and I think that laying in the bed  getting pressure to the sites is what has led to the formation of these on stage will pressure ulcers. Fortunately there does not appear to be any infection in both areas of ulceration of the heel appear to be stable at this point. Patient does not have any significant pain at the site she does have some of the eschar along the edges which is starting to lift up and come all hopefully slowly but surely this will occur. Patient did have arterial studies performed April 2018 which showed a normal TBI on the right knee slightly depressed TBI on the left but this does not appear to have changed significantly at that point. Specifically this was 0.75 on the right and 0.61 on the left. 09/13/17 on evaluation today patient presents for a follow-up evaluation regarding his bilateral heal ulcers. He has been tolerating the dressing changes fairly well although he did have some bleeding earlier today when it appears some of the skin on the surrounding portion of the wound got pulled where this is starting to flake off. Subsequently there does not appear to be any bleeding right now. Since I last saw him he has been discharged from the facility and is now back at home. His wife is having a very difficult time being able to get him up and in the car in order to come for an appointment which  is why they missed the last appointment have not been able to come back sooner. Fortunately the he does not have any signs of infection. No fevers chills noted. He does have difficulty walking due to pain in the bilateral heels. 10/11/17 on evaluation today patient presents for follow-up evaluation concerning his ongoing bilateral heal ulcers. He has been tolerating the dressing changes which were Betadine paints daily without complication. I have not seen him since September 13, 2017. Pressure that I saw him August 19, 2017. Subsequently those are the only prior visits before today. His left heel actually seems to have a significant amount of eschar that is starting to lift up at this point and seems to be a little more loose as far as drainage building up underneath the eschar. The right eschar though there are some parts lifting up seems to still be fairly stable which is good news. His toe ulcer is almost completely healed a lot of that eschar came off and it looks very well underneath. 12/16/17 on evaluation today patient appears to be doing extremely well in regard to his bilateral heal ulcers. His right heel shows good signs of improvement with good epithelialization. Overall the Prisma seems to be of benefit for him. In regard to the left heel he is showing more granulation tissue than previously noted obviously this is what we want to see. This also appears to be more firm and solid than previously noted. Nonetheless I still think that we should utilize the Iodoflex for a time more at this point. Overall however I'm extremely pleased with his progress. Sisney, Najee L. ([id number]) Patient History Information obtained from Patient. Family History No family history of Cancer, Diabetes, Heart Disease, Hereditary Spherocytosis, Hypertension, Kidney Disease, Lung Disease, Seizures, Stroke, Thyroid Problems, Tuberculosis. Social History Former smoker, Marital Status - Married, Alcohol Use -  Rarely - 2oz day, Drug Use - No History, Caffeine Use - Daily. Review of Systems (ROS) Constitutional Symptoms (General Health) Denies complaints or symptoms of Fever, Chills. Respiratory The patient has no complaints or symptoms. Cardiovascular The patient has no complaints or symptoms. Psychiatric The  patient has no complaints or symptoms. Objective Constitutional Well-nourished and well-hydrated in no acute distress. Vitals Time Taken: 12:43 PM, Height: 70 in, Weight: 190 lbs, BMI: 27.3, Temperature: 97.7 F, Pulse: 70 bpm, Respiratory Rate: 16 breaths/min, Blood Pressure: 118/55 mmHg. Respiratory normal breathing without difficulty. Psychiatric this patient is able to make decisions and demonstrates good insight into disease process. Alert and Oriented x 3. pleasant and cooperative. General Notes: Patient's wounds did both require sharp debridement today and he tolerated this without complication. Post debridement the patient especially in regard to the right heel showed signs of an excellent wound bed. The left heel is actually doing much better as well although again it's a little bit more difficult to gauge this as he's had so much necrotic tissue it's more of a visual difference as well is a feeling and how the granulation tissue appears. In general I really think though he is doing excellent at this point fortunately he did get his supplies. Integumentary (Hair, Skin) Wound #1 status is Open. Original cause of wound was Pressure Injury. The wound is located on the Left Calcaneus. The wound measures 2.9cm length x 3cm width x 0.2cm depth; 6.833cm^2 area and 1.367cm^3 volume. There is Fat Layer (Subcutaneous Tissue) Exposed exposed. There is no tunneling or undermining noted. There is a small amount of serosanguineous drainage noted. The wound margin is distinct with the outline attached to the wound base. There is small (1-33%) pink granulation within the wound bed. There is a  large (67-100%) amount of necrotic tissue within the wound bed including Adherent Slough. The periwound skin appearance had no abnormalities noted for color. The periwound skin Dyment, [place] ([id number]) appearance exhibited: Excoriation. The periwound skin appearance did not exhibit: Callus, Crepitus, Induration, Rash, Scarring. Wound #2 status is Open. Original cause of wound was Pressure Injury. The wound is located on the Right Calcaneus. The wound measures 1.4cm length x 1.7cm width x 0.1cm depth; 1.869cm^2 area and 0.187cm^3 volume. There is Fat Layer (Subcutaneous Tissue) Exposed exposed. There is no tunneling or undermining noted. There is a small amount of drainage noted. The wound margin is distinct with the outline attached to the wound base. There is small (1-33%) red, pink granulation within the wound bed. There is a large (67-100%) amount of necrotic tissue within the wound bed including Adherent Slough. Assessment Active Problems ICD-10 Pressure ulcer of left heel, stage 3 Pressure ulcer of right heel, stage 3 Chronic atrial fibrillation Essential (primary) hypertension Long term (current) use of anticoagulants Procedures Wound #1 Pre-procedure diagnosis of Wound #1 is a Pressure Ulcer located on the Left Calcaneus . There was a Excisional Skin/Subcutaneous Tissue Debridement with a total area of 8.7 sq cm performed by STONE III, HOYT E., PA-C. With the following instrument(s): Curette to remove Viable and Non-Viable tissue/material. Material removed includes Subcutaneous Tissue, Slough, and Biofilm after achieving pain control using Other (lidocaine 4%). No specimens were taken. A time out was conducted at 12:58, prior to the start of the procedure. A Minimum amount of bleeding was controlled with Pressure. The procedure was tolerated well with a pain level of 0 throughout and a pain level of 0 following the procedure. Patient s Level of Consciousness post procedure was  recorded as Awake and Alert. Post Debridement Measurements: 2.9cm length x 3cm width x 0.3cm depth; 2.05cm^3 volume. Post debridement Stage noted as Category/Stage III. Character of Wound/Ulcer Post Debridement is stable. Post procedure Diagnosis Wound #1: Same as Pre-Procedure Wound #2 Pre-procedure diagnosis  of Wound #2 is a Pressure Ulcer located on the Right Calcaneus . There was a Excisional Skin/Subcutaneous Tissue Debridement with a total area of 2.38 sq cm performed by STONE III, HOYT E., PA-C. With the following instrument(s): Curette to remove Viable and Non-Viable tissue/material. Material removed includes Subcutaneous Tissue, Slough, and Biofilm after achieving pain control using Other (lidocaine 4%). No specimens were taken. A time out was conducted at 12:58, prior to the start of the procedure. A Minimum amount of bleeding was controlled with Pressure. The procedure was tolerated well with a pain level of 0 throughout and a pain level of 0 following the procedure. Patient s Level of Consciousness post procedure was recorded as Awake and Alert. Post Debridement Measurements: 1.4cm length x 1.7cm width x 0.2cm depth; 0.374cm^3 volume. Post debridement Stage noted as Category/Stage III. Character of Wound/Ulcer Post Debridement is stable. Post procedure Diagnosis Wound #2: Same as Pre-Procedure Clayson, Gilliam ([id number]) Plan Wound Cleansing: Wound #1 Left Calcaneus: Clean wound with Normal Saline. Cleanse wound with mild soap and water May Shower, gently pat wound dry prior to applying new dressing. Wound #2 Right Calcaneus: Clean wound with Normal Saline. Cleanse wound with mild soap and water May Shower, gently pat wound dry prior to applying new dressing. Anesthetic (add to Medication List): Wound #1 Left Calcaneus: Topical Lidocaine 4% cream applied to wound bed prior to debridement (In Clinic Only). Wound #2 Right Calcaneus: Topical Lidocaine 4% cream applied to  wound bed prior to debridement (In Clinic Only). Skin Barriers/Peri-Wound Care: Wound #1 Left Calcaneus: Skin Prep Wound #2 Right Calcaneus: Skin Prep Primary Wound Dressing: Wound #1 Left Calcaneus: Iodoflex Wound #2 Right Calcaneus: Silver Collagen Secondary Dressing: Wound #1 Left Calcaneus: Dry Gauze Boardered Foam Dressing Wound #2 Right Calcaneus: Dry Gauze Boardered Foam Dressing Dressing Change Frequency: Wound #1 Left Calcaneus: Change dressing every other day. Wound #2 Right Calcaneus: Change dressing every other day. Follow-up Appointments: Wound #1 Left Calcaneus: Return Appointment in 1 week. Wound #2 Right Calcaneus: Return Appointment in 1 week. Off-Loading: Wound #1 Left Calcaneus: Heel suspension boot to: - Please order prevalon boots for bilateral feet Please float heel when pt is in the bed (NO PRESSURE ON HEELS) Turn and reposition every 2 hours Wound #2 Right Calcaneus: Heel suspension boot to: - Please order prevalon boots for bilateral feet Please float heel when pt is in the bed (NO PRESSURE ON HEELS) Turn and reposition every 2 hours Additional Orders / Instructions: Wound #1 Left Calcaneus: Increase protein intake. Wound #2 Right Calcaneus: [place], [place] ([id number]) Increase protein intake. Home Health: Wound #1 Left Calcaneus: D/C Home Health Services Wound #2 Right Calcaneus: D/C Home Health Services I'm gonna recommend currently that we go ahead and initiate treatment with the same dressings as before over the next weeks time. Patient is in agreement with plan. We will subsequently see were things stand at follow-up in one week. Hopefully will be able to switch over to Prisma at some point in the near future. That is in regard to the left heel. Please see above for specific wound care orders. We will see patient for re-evaluation in 1 week(s) here in the clinic. If anything worsens or changes patient will contact our office for  additional recommendations. Electronic Signature(s) Signed: 12/16/2017 11:45:54 PM By: Worthy Keeler PA-C Entered By: Worthy Keeler on 12/16/2017 13:08:39 Bosshart, [place] ([id number]) -------------------------------------------------------------------------------- ROS/PFSH Details Patient Name: DOMEIER, Russ L. Date of Service: 12/16/2017 12:30 PM Medical Record  Number: [id number] Patient Account Number: [id number] Date of Birth/Sex: 1925/11/18 (84 y.o. M) Treating RN: Primary Care Provider: Ramonita Lab Other Clinician: Referring Provider: Ramonita Lab Treating Provider/Extender: Melburn Hake, HOYT Weeks in Treatment: 17 Information Obtained From Patient Wound History Do you currently have one or more open woundso Yes How many open wounds do you currently haveo 2 Approximately how long have you had your woundso 1 week How have you been treating your wound(s) until nowo nothing Has your wound(s) ever healed and then re-openedo No Have you had any lab work done in the past montho No Have you tested positive for osteomyelitis (bone infection)o No Have you had any tests for circulation on your legso No Have you had other problems associated with your woundso Swelling Constitutional Symptoms (General Health) Complaints and Symptoms: Negative for: Fever; Chills Respiratory Complaints and Symptoms: No Complaints or Symptoms Cardiovascular Complaints and Symptoms: No Complaints or Symptoms Medical History: Positive for: Arrhythmia - a-fib; Hypertension Oncologic Medical History: Positive for: Received Radiation Psychiatric Complaints and Symptoms: No Complaints or Symptoms Immunizations Pneumococcal Vaccine: Received Pneumococcal Vaccination: Yes Implantable Devices Family and Social History Dilling, [place] L. ([id number]) Cancer: No; Diabetes: No; Heart Disease: No; Hereditary Spherocytosis: No; Hypertension: No; Kidney Disease: No; Lung Disease: No; Seizures: No; Stroke: No;  Thyroid Problems: No; Tuberculosis: No; Former smoker; Marital Status - Married; Alcohol Use: Rarely - 2oz day; Drug Use: No History; Caffeine Use: Daily; Financial Concerns: No; Food, Clothing or Shelter Needs: No; Support System Lacking: No; Transportation Concerns: No; Advanced Directives: Yes (Not Provided); Patient does not want information on Advanced Directives; Do not resuscitate: No; Living Will: Yes (Not Provided); Medical Power of Attorney: No Physician Affirmation I have reviewed and agree with the above information. Electronic Signature(s) Signed: 12/16/2017 11:45:54 PM By: Worthy Keeler PA-C Entered By: Worthy Keeler on 12/16/2017 13:07:40 Nicoll, Tharun L. ([id number]) -------------------------------------------------------------------------------- SuperBill Details Patient Name: COTHRAN, Stormy L. Date of Service: 12/16/2017 Medical Record Number: [id number] Patient Account Number: [id number] Date of Birth/Sex: 10/06/1925 (84 y.o. M) Treating RN: Primary Care Provider: Ramonita Lab Other Clinician: Referring Provider: Ramonita Lab Treating Provider/Extender: Melburn Hake, HOYT Weeks in Treatment: 17 Diagnosis Coding ICD-10 Codes Code Description [phone] Pressure ulcer of left heel, stage 3 L89.613 Pressure ulcer of right heel, stage 3 I48.2 Chronic atrial fibrillation I10 Essential (primary) hypertension Z79.01 Long term (current) use of anticoagulants Facility Procedures CPT4 Code: 56389373 Description: 42876 - DEB SUBQ TISSUE 20 SQ CM/< ICD-10 Diagnosis Description L89.623 Pressure ulcer of left heel, stage 3 L89.613 Pressure ulcer of right heel, stage 3 Modifier: Quantity: 1 Physician Procedures CPT4 Code: 8115726 Description: 20355 - WC PHYS SUBQ TISS 20 SQ CM ICD-10 Diagnosis Description L89.623 Pressure ulcer of left heel, stage 3 L89.613 Pressure ulcer of right heel, stage 3 Modifier: Quantity: 1 Electronic Signature(s) Signed: 12/16/2017 11:45:54 PM By: Worthy Keeler PA-C Entered By: Worthy Keeler on 12/16/2017 13:28:27

## 2017-12-20 ENCOUNTER — Ambulatory Visit: Payer: Self-pay | Admitting: *Deleted

## 2017-12-23 ENCOUNTER — Encounter: Payer: PPO | Admitting: Physician Assistant

## 2017-12-23 DIAGNOSIS — I482 Chronic atrial fibrillation: Secondary | ICD-10-CM | POA: Diagnosis not present

## 2017-12-23 DIAGNOSIS — L89613 Pressure ulcer of right heel, stage 3: Secondary | ICD-10-CM | POA: Diagnosis not present

## 2017-12-23 DIAGNOSIS — L89623 Pressure ulcer of left heel, stage 3: Secondary | ICD-10-CM | POA: Diagnosis not present

## 2017-12-23 DIAGNOSIS — I10 Essential (primary) hypertension: Secondary | ICD-10-CM | POA: Diagnosis not present

## 2017-12-23 DIAGNOSIS — Z7901 Long term (current) use of anticoagulants: Secondary | ICD-10-CM | POA: Diagnosis not present

## 2017-12-25 NOTE — Progress Notes (Signed)
Skarda, Mattheu L. ([id number]) Visit Report for 12/23/2017 Arrival Information Details Patient Name: [name], [name] Date of Service: 12/23/2017 1:30 PM Medical Record Number: [id number] Patient Account Number: [id number] Date of Birth/Sex: 06/03/1926 (84 y.o. M) Treating RN: Montey Hora Primary Care Ravindra Baranek: Ramonita Lab Other Clinician: Referring Salli Bodin: Ramonita Lab Treating Magdiel Bartles/Extender: Melburn Hake, HOYT Weeks in Treatment: 18 Visit Information History Since Last Visit Added or deleted any medications: No Patient Arrived: Walker Any new allergies or adverse reactions: No Arrival Time: 13:30 Had a fall or experienced change in No Accompanied By: spouse activities of daily living that may affect Transfer Assistance: None risk of falls: Patient Identification Verified: Yes Signs or symptoms of abuse/neglect since last visito No Secondary Verification Process Yes Hospitalized since last visit: No Completed: Implantable device outside of the clinic excluding No Patient Requires Transmission-Based No cellular tissue based products placed in the center Precautions: since last visit: Patient Has Alerts: Yes Has Dressing in Place as Prescribed: Yes Patient Alerts: Patient on Blood Pain Present Now: No Thinner Warfarin Electronic Signature(s) Signed: 12/23/2017 5:27:17 PM By: Montey Hora Entered By: Montey Hora on 12/23/2017 13:30:32 Wixon, Kamas ([id number]) -------------------------------------------------------------------------------- Encounter Discharge Information Details Patient Name: [name], [name] Date of Service: 12/23/2017 1:30 PM Medical Record Number: [id number] Patient Account Number: [id number] Date of Birth/Sex: Apr 22, 1926 (84 y.o. M) Treating RN: Montey Hora Primary Care Maleigh Bagot: [facility]ita Lab Other Clinician: Referring Cleburn Maiolo: Ramonita Lab Treating Sabrinna Yearwood/Extender: Melburn Hake, HOYT Weeks in Treatment: 64 Encounter Discharge Information  Items Discharge Condition: Stable Ambulatory Status: Walker Discharge Destination: Home Transportation: Private Auto Accompanied ByRaechel Chute Schedule Follow-up Appointment: Yes Clinical Summary of Care: Electronic Signature(s) Signed: 12/23/2017 6:57:18 PM By: Montey Hora Entered By: Montey Hora on 12/23/2017 18:57:18 Divelbiss, Colter L. ([id number]) -------------------------------------------------------------------------------- Lower Extremity Assessment Details Patient Name: [name], [name] Date of Service: 12/23/2017 1:30 PM Medical Record Number: [id number] Patient Account Number: [id number] Date of Birth/Sex: 09/10/1925 (84 y.o. M) Treating RN: Montey Hora Primary Care Shaya Altamura: [facility] Other Clinician: Referring Ancel Easler: Ramonita Lab Treating Oneil Behney/Extender: Melburn Hake, HOYT Weeks in Treatment: 18 Edema Assessment Assessed: [Left: No] [Right: No] Edema: [Left: No] [Right: No] Vascular Assessment Pulses: Dorsalis Pedis Palpable: [Left:Yes] [Right:Yes] Posterior Tibial Extremity colors, hair growth, and conditions: Extremity Color: [Left:Normal] [Right:Normal] Hair Growth on Extremity: [Left:No] [Right:No] Temperature of Extremity: [Left:Warm] [Right:Warm] Capillary Refill: [Left:< 3 seconds] [Right:< 3 seconds] Toe Nail Assessment Left: Right: Thick: Yes Yes Discolored: Yes Yes Deformed: No No Improper Length and Hygiene: No No Electronic Signature(s) Signed: 12/23/2017 5:27:17 PM By: Montey Hora Entered By: Montey Hora on 12/23/2017 13:42:16 Pinela, Price L. ([id number]) -------------------------------------------------------------------------------- Multi Wound Chart Details Patient Name: [name], [name] Date of Service: 12/23/2017 1:30 PM Medical Record Number: [id number] Patient Account Number: [id number] Date of Birth/Sex: [birth year] (84 y.o. M) Treating RN: Roger Shelter Primary Care Maydell Knoebel: [facility]ita Lab Other Clinician: Referring  Lorriane Dehart: Ramonita Lab Treating Ariauna Farabee/Extender: Melburn Hake, HOYT Weeks in Treatment: 18 Vital Signs Height(in): 70 Pulse(bpm): 70 Weight(lbs): 190 Blood Pressure(mmHg): 128/54 Body Mass Index(BMI): 27 Temperature(F): 97.5 Respiratory Rate 16 (breaths/min): Photos: [1:No Photos] [2:No Photos] [N/A:N/A] Wound Location: [1:Left Calcaneus] [2:Right Calcaneus] [N/A:N/A] Wounding Event: [1:Pressure Injury] [2:Pressure Injury] [N/A:N/A] Primary Etiology: [1:Pressure Ulcer] [2:Pressure Ulcer] [N/A:N/A] Comorbid History: [1:Arrhythmia, Hypertension, Received Radiation] [2:Arrhythmia, Hypertension, Received Radiation] [N/A:N/A] Date Acquired: [1:08/12/2017] [2:08/12/2017] [N/A:N/A] Weeks of Treatment: [1:18] [2:18] [N/A:N/A] Wound Status: [1:Open] [2:Open] [N/A:N/A] Measurements L x W x D [1:2.6x3.1x0.2] [2:1.1x1x0.1] [N/A:N/A] (cm) Area (cm) : [1:6.33] [2:0.864] [N/A:N/A] Volume (cm) : [1:1.266] [2:0.086] [  N/A:N/A] % Reduction in Area: [1:72.30%] [2:92.50%] [N/A:N/A] % Reduction in Volume: [1:72.30%] [2:96.30%] [N/A:N/A] Classification: [1:Category/Stage III] [2:Category/Stage III] [N/A:N/A] Exudate Amount: [1:Medium] [2:Small] [N/A:N/A] Exudate Type: [1:Serosanguineous] [2:N/A] [N/A:N/A] Exudate Color: [1:red, brown] [2:N/A] [N/A:N/A] Wound Margin: [1:Distinct, outline attached] [2:Distinct, outline attached] [N/A:N/A] Granulation Amount: [1:Small (1-33%)] [2:Medium (34-66%)] [N/A:N/A] Granulation Quality: [1:Pink] [2:Red, Pink] [N/A:N/A] Necrotic Amount: [1:Large (67-100%)] [2:Medium (34-66%)] [N/A:N/A] Exposed Structures: [1:Fat Layer (Subcutaneous Tissue) Exposed: Yes Fascia: No Tendon: No Muscle: No Joint: No Bone: No] [2:Fat Layer (Subcutaneous Tissue) Exposed: Yes Fascia: No Tendon: No Muscle: No Joint: No Bone: No] [N/A:N/A] Epithelialization: [1:None] [2:None] [N/A:N/A] Periwound Skin Texture: [1:Excoriation: Yes Induration: No Callus: No Crepitus: No Rash: No Scarring: No]  [2:No Abnormalities Noted] [N/A:N/A] Periwound Skin Moisture: No Abnormalities Noted No Abnormalities Noted N/A Periwound Skin Color: No Abnormalities Noted No Abnormalities Noted N/A Tenderness on Palpation: No No N/A Wound Preparation: Ulcer Cleansing: Ulcer Cleansing: N/A Rinsed/Irrigated with Saline Rinsed/Irrigated with Saline Topical Anesthetic Applied: Topical Anesthetic Applied: Other: lidocaine 4% Other: lidocaine 4% Treatment Notes Electronic Signature(s) Signed: 12/23/2017 5:06:21 PM By: Roger Shelter Entered By: Roger Shelter on 12/23/2017 14:32:54 [name], [name] ([id number]) -------------------------------------------------------------------------------- [place] Details Patient Name: [name], [name] Date of Service: 12/23/2017 1:30 PM Medical Record Number: [id number] Patient Account Number: [id number] Date of Birth/Sex: [birth year] (84 y.o. M) Treating RN: Roger Shelter Primary Care Takia Runyon: [facility] Other Clinician: Referring Izella Ybanez: [facility]ita [facility] Treating Juanya Villavicencio/Extender: Melburn Hake, HOYT Weeks in Treatment: 46 Active Inactive ` Abuse / Safety / Falls / Self Care Management Nursing Diagnoses: History of Falls Potential for falls Goals: Patient will not experience any injury related to falls Date Initiated: 08/19/2017 Target Resolution Date: 12/14/2017 Goal Status: Active Interventions: Assess Activities of Daily Living upon admission and as needed Assess fall risk on admission and as needed Assess: immobility, friction, shearing, incontinence upon admission and as needed Assess impairment of mobility on admission and as needed per policy Assess personal safety and home safety (as indicated) on admission and as needed Assess self care needs on admission and as needed Notes: ` Nutrition Nursing Diagnoses: Imbalanced nutrition Potential for alteratiion in Nutrition/Potential for imbalanced  nutrition Goals: Patient/caregiver agrees to and verbalizes understanding of need to use nutritional supplements and/or vitamins as prescribed Date Initiated: 08/19/2017 Target Resolution Date: 11/16/2017 Goal Status: Active Interventions: Assess patient nutrition upon admission and as needed per policy Notes: ` Orientation to the Wound Care Program Nursing Diagnoses: Sandridge, Lateef L. ([id number]) Knowledge deficit related to the wound healing center program Goals: Patient/caregiver will verbalize understanding of the [place] Date Initiated: 08/19/2017 Target Resolution Date: 09/14/2017 Goal Status: Active Interventions: Provide education on orientation to the wound center Notes: ` Pain, Acute or Chronic Nursing Diagnoses: Pain, acute or chronic: actual or potential Potential alteration in comfort, pain Goals: Patient/caregiver will verbalize adequate pain control between visits Date Initiated: 08/19/2017 Target Resolution Date: 12/14/2017 Goal Status: Active Interventions: Complete pain assessment as per visit requirements Notes: ` Pressure Nursing Diagnoses: Knowledge deficit related to causes and risk factors for pressure ulcer development Knowledge deficit related to management of pressures ulcers Potential for impaired tissue integrity related to pressure, friction, moisture, and shear Goals: Patient will remain free from development of additional pressure ulcers Date Initiated: 08/19/2017 Target Resolution Date: 12/14/2017 Goal Status: Active Interventions: Assess: immobility, friction, shearing, incontinence upon admission and as needed Assess potential for pressure ulcer upon admission and as needed Notes: ` Wound/Skin Impairment Nursing Diagnoses: Impaired tissue integrity Knowledge deficit related  to ulceration/compromised skin integrity Lapage, [place] ([id number]) Goals: Ulcer/skin breakdown will have a volume reduction of 80% by week  12 Date Initiated: 08/19/2017 Target Resolution Date: 12/14/2017 Goal Status: Active Interventions: Assess patient/caregiver ability to perform ulcer/skin care regimen upon admission and as needed Assess ulceration(s) every visit Notes: Electronic Signature(s) Signed: 12/23/2017 5:06:21 PM By: Roger Shelter Entered By: Roger Shelter on 12/23/2017 14:32:42 [name], [name] ([id number]) -------------------------------------------------------------------------------- Pain Assessment Details Patient Name: BRIGGS, Brenyn L. Date of Service: 12/23/2017 1:30 PM Medical Record Number: [id number] Patient Account Number: [id number] Date of Birth/Sex: 1925/11/26 (84 y.o. M) Treating RN: Montey Hora Primary Care Kerry Odonohue: [facility] Other Clinician: Referring Yanelly Cantrelle: Ramonita Lab Treating Brenae Lasecki/Extender: Melburn Hake, HOYT Weeks in Treatment: 18 Active Problems Location of Pain Severity and Description of Pain Patient Has Paino No Site Locations Pain Management and Medication Current Pain Management: Electronic Signature(s) Signed: 12/23/2017 5:27:17 PM By: Montey Hora Entered By: Montey Hora on 12/23/2017 13:30:37 [name], [name] ([id number]) -------------------------------------------------------------------------------- Patient/Caregiver Education Details Patient Name: KAZUMI, LACHNEY L. Date of Service: 12/23/2017 1:30 PM Medical Record Number: [id number] Patient Account Number: [id number] Date of Birth/Gender: [birth year] (84 y.o. M) Treating RN: Montey Hora Primary Care Physician: Ramonita Lab Other Clinician: Referring Physician: Ramonita Lab Treating Physician/Extender: Sharalyn Ink in Treatment: 60 Education Assessment Education Provided To: Patient and Caregiver Education Topics Provided Wound/Skin Impairment: Handouts: Other: Wound care as ordered Methods: Demonstration, Explain/Verbal Responses: State content correctly Electronic Signature(s) Signed:  12/24/2017 3:48:21 PM By: Montey Hora Entered By: Montey Hora on 12/23/2017 18:58:19 [name], [name] ([id number]) -------------------------------------------------------------------------------- Wound Assessment Details Patient Name: [name], [name] Date of Service: 12/23/2017 1:30 PM Medical Record Number: [id number] Patient Account Number: [id number] Date of Birth/Sex: 01/20/1926 (84 y.o. M) Treating RN: Montey Hora Primary Care Stellan Vick: [facility]ita [facility] Other Clinician: Referring Yulian Gosney: Ramonita Lab Treating Justino Boze/Extender: Melburn Hake, HOYT Weeks in Treatment: 18 Wound Status Wound Number: 1 Primary Etiology: Pressure Ulcer Wound Location: Left Calcaneus Wound Status: Open Wounding Event: Pressure Injury Comorbid Arrhythmia, Hypertension, Received History: Radiation Date Acquired: 08/12/2017 Weeks Of Treatment: 18 Clustered Wound: No Photos Photo Uploaded By: Montey Hora on 12/23/2017 16:00:16 Wound Measurements Length: (cm) 2.6 Width: (cm) 3.1 Depth: (cm) 0.2 Area: (cm) 6.33 Volume: (cm) 1.266 % Reduction in Area: 72.3% % Reduction in Volume: 72.3% Epithelialization: None Tunneling: No Undermining: No Wound Description Classification: Category/Stage III Wound Margin: Distinct, outline attached Exudate Amount: Medium Exudate Type: Serosanguineous Exudate Color: red, brown Foul Odor After Cleansing: No Slough/Fibrino Yes Wound Bed Granulation Amount: Small (1-33%) Exposed Structure Granulation Quality: Pink Fascia Exposed: No Necrotic Amount: Large (67-100%) Fat Layer (Subcutaneous Tissue) Exposed: Yes Necrotic Quality: Adherent Slough Tendon Exposed: No Muscle Exposed: No Joint Exposed: No Bone Exposed: No Periwound Skin Texture [name], [name] ([id number]) Texture Color No Abnormalities Noted: No No Abnormalities Noted: Yes Callus: No Crepitus: No Excoriation: Yes Induration: No Rash: No Scarring: No Moisture No Abnormalities Noted:  No Wound Preparation Ulcer Cleansing: Rinsed/Irrigated with Saline Topical Anesthetic Applied: Other: lidocaine 4%, Treatment Notes Wound #1 (Left Calcaneus) 1. Cleansed with: Clean wound with Normal Saline 2. Anesthetic Topical Lidocaine 4% cream to wound bed prior to debridement 4. Dressing Applied: Promogran 5. Secondary Dressing Applied Kerlix/Conform Non-Adherent pad 6. Footwear/Offloading device applied Other footwear/offloading device applied (specify in notes) Notes [employer] Electronic Signature(s) Signed: 12/23/2017 5:27:17 PM By: Montey Hora Entered By: Montey Hora on 12/23/2017 13:41:30 [name], [name] ([id number]) -------------------------------------------------------------------------------- Wound Assessment Details Patient Name: [name], [name] Date of Service: 12/23/2017 1:30  PM Medical Record Number: [id number] Patient Account Number: [id number] Date of Birth/Sex: 07/04/1926 (84 y.o. M) Treating RN: Montey Hora Primary Care Erubiel Manasco: [facility]ita Lab Other Clinician: Referring Shakelia Scrivner: Ramonita Lab Treating Anshika Pethtel/Extender: Melburn Hake, HOYT Weeks in Treatment: 18 Wound Status Wound Number: 2 Primary Etiology: Pressure Ulcer Wound Location: Right Calcaneus Wound Status: Open Wounding Event: Pressure Injury Comorbid Arrhythmia, Hypertension, Received History: Radiation Date Acquired: 08/12/2017 Weeks Of Treatment: 18 Clustered Wound: No Photos Photo Uploaded By: Montey Hora on 12/23/2017 16:00:29 Wound Measurements Length: (cm) 1.1 Width: (cm) 1 Depth: (cm) 0.1 Area: (cm) 0.864 Volume: (cm) 0.086 % Reduction in Area: 92.5% % Reduction in Volume: 96.3% Epithelialization: None Tunneling: No Undermining: No Wound Description Classification: Category/Stage III Wound Margin: Distinct, outline attached Exudate Amount: Small Foul Odor After Cleansing: No Slough/Fibrino Yes Wound Bed Granulation Amount: Medium (34-66%) Exposed  Structure Granulation Quality: Red, Pink Fascia Exposed: No Necrotic Amount: Medium (34-66%) Fat Layer (Subcutaneous Tissue) Exposed: Yes Necrotic Quality: Adherent Slough Tendon Exposed: No Muscle Exposed: No Joint Exposed: No Bone Exposed: No Periwound Skin Texture Texture Color No Abnormalities Noted: No No Abnormalities Noted: No [name], [place] ([id number]) Moisture No Abnormalities Noted: No Wound Preparation Ulcer Cleansing: Rinsed/Irrigated with Saline Topical Anesthetic Applied: Other: lidocaine 4%, Treatment Notes Wound #2 (Right Calcaneus) 1. Cleansed with: Clean wound with Normal Saline 2. Anesthetic Topical Lidocaine 4% cream to wound bed prior to debridement 4. Dressing Applied: Promogran 5. Secondary Dressing Applied Kerlix/Conform Non-Adherent pad 6. Footwear/Offloading device applied Other footwear/offloading device applied (specify in notes) Notes [employer] Electronic Signature(s) Signed: 12/23/2017 5:27:17 PM By: Montey Hora Entered By: Montey Hora on 12/23/2017 13:41:46 Brodt, Lakin L. ([id number]) -------------------------------------------------------------------------------- Vitals Details Patient Name: VENUTO, Simran L. Date of Service: 12/23/2017 1:30 PM Medical Record Number: [id number] Patient Account Number: [id number] Date of Birth/Sex: [birth year] (84 y.o. M) Treating RN: Montey Hora Primary Care Azarel Banner: [facility]onita Lab Other Clinician: Referring Xhaiden Coombs: Ramonita Lab Treating Kamarian Sahakian/Extender: Melburn Hake, HOYT Weeks in Treatment: 18 Vital Signs Time Taken: 13:31 Temperature (F): 97.5 Height (in): 70 Pulse (bpm): 70 Weight (lbs): 190 Respiratory Rate (breaths/min): 16 Body Mass Index (BMI): 27.3 Blood Pressure (mmHg): 128/54 Reference Range: 80 - 120 mg / dl Electronic Signature(s) Signed: 12/23/2017 5:27:17 PM By: Montey Hora Entered By: Montey Hora on 12/23/2017 13:36:14

## 2017-12-25 NOTE — Progress Notes (Signed)
Demarais, Pearson L. ([id number]) Visit Report for 12/23/2017 Chief Complaint Document Details Patient Name: PHINEHAS, GROUNDS. Date of Service: 12/23/2017 1:30 PM Medical Record Number: [id number] Patient Account Number: [id number] Date of Birth/Sex: May 21, 1926 (84 y.o. M) Treating RN: Roger Shelter Primary Care Provider: Ramonita Lab Other Clinician: Referring Provider: Ramonita Lab Treating Provider/Extender: Melburn Hake, Maxwell Martorano Weeks in Treatment: 18 Information Obtained from: Patient Chief Complaint Bilateral Heel pressure ulcers and bilateral great toe DTIs Electronic Signature(s) Signed: 12/24/2017 1:56:08 AM By: Worthy Keeler PA-C Entered By: Worthy Keeler on 12/23/2017 13:30:01 Laatsch, [place] ([id number]) -------------------------------------------------------------------------------- Debridement Details Patient Name: MURTY, Plato L. Date of Service: 12/23/2017 1:30 PM Medical Record Number: [id number] Patient Account Number: [id number] Date of Birth/Sex: 1926-02-29 (84 y.o. M) Treating RN: Roger Shelter Primary Care Provider: Ramonita Lab Other Clinician: Referring Provider: Ramonita Lab Treating Provider/Extender: Melburn Hake, Marlana Mckowen Weeks in Treatment: 18 Debridement Performed for Wound #2 Right Calcaneus Assessment: Performed By: Physician STONE III, Annalea Alguire E., PA-C Debridement Type: Debridement Pre-procedure Verification/Time Yes - 14:35 Out Taken: Start Time: 14:35 Pain Control: Other : lidocaIne 4% Total Area Debrided (L x W): 1.1 (cm) x 1 (cm) = 1.1 (cm) Tissue and other material Viable, Non-Viable, Callus, Slough, Subcutaneous, Biofilm, Slough debrided: Level: Skin/Subcutaneous Tissue Debridement Description: Excisional Instrument: Curette Bleeding: Minimum Hemostasis Achieved: Pressure End Time: 14:37 Procedural Pain: 0 Post Procedural Pain: 0 Response to Treatment: Procedure was tolerated well Level of Consciousness: Awake and Alert Post Procedure  Vitals: Temperature: 97.5 Pulse: 70 Respiratory Rate: 16 Blood Pressure: Systolic Blood Pressure: 875 Diastolic Blood Pressure: 54 Post Debridement Measurements of Total Wound Length: (cm) 1.1 Stage: Category/Stage III Width: (cm) 1 Depth: (cm) 0.2 Volume: (cm) 0.173 Character of Wound/Ulcer Post Stable Debridement: Post Procedure Diagnosis Same as Pre-procedure Electronic Signature(s) Signed: 12/23/2017 5:06:21 PM By: Roger Shelter Signed: 12/24/2017 1:56:08 AM By: Worthy Keeler PA-C Entered By: Roger Shelter on 12/23/2017 14:36:43 Whiteside, [place] ([id number]) Lariccia, [place] ([id number]) -------------------------------------------------------------------------------- Debridement Details Patient Name: GUERRANT, Pattrick L. Date of Service: 12/23/2017 1:30 PM Medical Record Number: [id number] Patient Account Number: [id number] Date of Birth/Sex: 1926/05/17 (84 y.o. M) Treating RN: Roger Shelter Primary Care Provider: Ramonita Lab Other Clinician: Referring Provider: Ramonita Lab Treating Provider/Extender: Melburn Hake, Dejah Droessler Weeks in Treatment: 18 Debridement Performed for Wound #1 Left Calcaneus Assessment: Performed By: Physician STONE III, Philip Kotlyar E., PA-C Debridement Type: Debridement Pre-procedure Verification/Time Yes - 14:35 Out Taken: Start Time: 14:35 Pain Control: Other : lidocaIne 4% Total Area Debrided (L x W): 2.6 (cm) x 3.1 (cm) = 8.06 (cm) Tissue and other material Viable, Non-Viable, Callus, Slough, Subcutaneous, Biofilm, Slough debrided: Level: Skin/Subcutaneous Tissue Debridement Description: Excisional Instrument: Curette Bleeding: Minimum Hemostasis Achieved: Pressure End Time: 14:37 Procedural Pain: 0 Post Procedural Pain: 0 Response to Treatment: Procedure was tolerated well Level of Consciousness: Awake and Alert Post Procedure Vitals: Temperature: 97.5 Pulse: 70 Respiratory Rate: 16 Blood Pressure: Systolic Blood Pressure:  557 Diastolic Blood Pressure: 54 Post Debridement Measurements of Total Wound Length: (cm) 2.6 Stage: Category/Stage III Width: (cm) 3.1 Depth: (cm) 0.2 Volume: (cm) 1.266 Character of Wound/Ulcer Post Stable Debridement: Post Procedure Diagnosis Same as Pre-procedure Electronic Signature(s) Signed: 12/23/2017 5:06:21 PM By: Roger Shelter Signed: 12/24/2017 1:56:08 AM By: Worthy Keeler PA-C Entered By: Roger Shelter on 12/23/2017 14:37:19 Nwosu, Jaymari L. ([id number]) Louks, McDougal ([id number]) -------------------------------------------------------------------------------- HPI Details Patient Name: DROTAR, Tyrees L. Date of Service: 12/23/2017 1:30 PM Medical Record Number: [id number] Patient Account Number: [id number] Date of Birth/Sex: Jul 25, 1925 ( 84 y.o. M) Treating RN: Roger Shelter Primary Care Provider: Ramonita Lab Other Clinician: Referring Provider: Ramonita Lab Treating Provider/Extender: Melburn Hake, Bevelyn Arriola Weeks in Treatment: 18 History of Present Illness Associated Signs and Symptoms: Patient has a history of chronic atrophic relation, hypertension, and long-term use of anticoagulants. He also had a fall with pelvic fracture which occurred on January 2019. HPI Description: 08/19/17 patient presents today for initial evaluation and our clinic concerning issues he has been having with the bilateral heels as well as the lateral great toe was since he was admitted to the nursing facility following a pelvic fracture. Initially he was spending a significant amount of time in the bed although he is now up more often since the healing process has progressed along the way obviously. When he is up he's spending most of the time sitting in a wheelchair at this point. When he is in bed he lays on his back and the covers/sheets are pushing down on his toes bilaterally which I think it may be what's causing the issues with his deep tissue injury to the bilateral great toes.  With that being said the hills also appear to be pressure in nature and I think that laying in the bed getting pressure to the sites is what has led to the formation of these on stage will pressure ulcers. Fortunately there does not appear to be any infection in both areas of ulceration of the heel appear to be stable at this point. Patient does not have any significant pain at the site she does have some of the eschar along the edges which is starting to lift up and come all hopefully slowly but surely this will occur. Patient did have arterial studies performed April 2018 which showed a normal TBI on the right knee slightly depressed TBI on the left but this does not appear to have changed significantly at that point. Specifically this was 0.75 on the right and 0.61 on the left. 09/13/17 on evaluation today patient presents for a follow-up evaluation regarding his bilateral heal ulcers. He has been tolerating the dressing changes fairly well although he did have some bleeding earlier today when it appears some of the skin on the surrounding portion of the wound got pulled where this is starting to flake off. Subsequently there does not appear to be any bleeding right now. Since I last saw him he has been discharged from the facility and is now back at home. His wife is having a very difficult time being able to get him up and in the car in order to come for an appointment which is why they missed the last appointment have not been able to come back sooner. Fortunately the he does not have any signs of infection. No fevers chills noted. He does have difficulty walking due to pain in the bilateral heels. 10/11/17 on evaluation today patient presents for follow-up evaluation concerning his ongoing bilateral heal ulcers. He has been tolerating the dressing changes which were Betadine paints daily without complication. I have not seen him since September 13, 2017. Pressure that I saw him August 19, 2017.  Subsequently those are the only prior visits before today. His left heel actually seems to have a significant amount of eschar that is starting to lift up at this point and seems to be a little more loose as far as drainage building up underneath the eschar. The right eschar though there are some parts lifting up seems to still be fairly stable which  is good news. His toe ulcer is almost completely healed a lot of that eschar came off and it looks very well underneath. 12/16/17 on evaluation today patient appears to be doing extremely well in regard to his bilateral heal ulcers. His right heel shows good signs of improvement with good epithelialization. Overall the Prisma seems to be of benefit for him. In regard to the left heel he is showing more granulation tissue than previously noted obviously this is what we want to see. This also appears to be more firm and solid than previously noted. Nonetheless I still think that we should utilize the Iodoflex for a time more at this point. Overall however I'm extremely pleased with his progress. 12/23/17 on evaluation today patient actually appears to be doing better in regard to his bilateral heal ulcers. He has been tolerating the dressing changes without complication and in fact tells me that he is having very little discomfort and pain which is great news. Overall I'm very happy with the progress he's been making in recent weeks. Electronic Signature(s) Signed: 12/24/2017 1:56:08 AM By: Worthy Keeler PA-C Entered By: Worthy Keeler on 12/24/2017 01:10:24 Preuss, [place] ([id number]) Deguzman, [place] ([id number]) -------------------------------------------------------------------------------- Physical Exam Details Patient Name: Obeirne, Alper L. Date of Service: 12/23/2017 1:30 PM Medical Record Number: [id number] Patient Account Number: [id number] Date of Birth/Sex: December 25, 1925 (84 y.o. M) Treating RN: Roger Shelter Primary Care Provider:  Ramonita Lab Other Clinician: Referring Provider: Ramonita Lab Treating Provider/Extender: Melburn Hake, Anae Hams Weeks in Treatment: 84 Constitutional Well-nourished and well-hydrated in no acute distress. Respiratory normal breathing without difficulty. Psychiatric this patient is able to make decisions and demonstrates good insight into disease process. Alert and Oriented x 3. pleasant and cooperative. Notes Patient's wound bed shows evidence of good granulation in regard to the right heel and there was just a little bit of slough that required sharp debridement on the surface of the wound. In regard to left heel this also appears to be doing significantly better he does still have more slough that required sharp debridement today but post debridement the wound bed looks the best it has and I think he may be ready to switch to Prisma at this location as well. Electronic Signature(s) Signed: 12/24/2017 1:56:08 AM By: Worthy Keeler PA-C Entered By: Worthy Keeler on 12/24/2017 01:11:01 Orban, Trace L. ([id number]) -------------------------------------------------------------------------------- Physician Orders Details Patient Name: AKSHAT, MINEHART L. Date of Service: 12/23/2017 1:30 PM Medical Record Number: [id number] Patient Account Number: [id number] Date of Birth/Sex: March 24, 1926 (84 y.o. M) Treating RN: Roger Shelter Primary Care Provider: Ramonita Lab Other Clinician: Referring Provider: Ramonita Lab Treating Provider/Extender: Melburn Hake, Zaynah Chawla Weeks in Treatment: 56 Verbal / Phone Orders: No Diagnosis Coding ICD-10 Coding Code Description L89.623 Pressure ulcer of left heel, stage 3 L89.613 Pressure ulcer of right heel, stage 3 I48.2 Chronic atrial fibrillation I10 Essential (primary) hypertension Z79.01 Long term (current) use of anticoagulants Wound Cleansing Wound #1 Left Calcaneus o Clean wound with Normal Saline. o Cleanse wound with mild soap and water o May  Shower, gently pat wound dry prior to applying new dressing. Wound #2 Right Calcaneus o Clean wound with Normal Saline. o Cleanse wound with mild soap and water o May Shower, gently pat wound dry prior to applying new dressing. Anesthetic (add to Medication List) Wound #1 Left Calcaneus o Topical Lidocaine 4% cream applied to wound bed prior to debridement (In Clinic Only). Wound #2 Right Calcaneus o Topical Lidocaine 4% cream applied to  wound bed prior to debridement (In Clinic Only). Skin Barriers/Peri-Wound Care Wound #1 Left Calcaneus o Skin Prep Wound #2 Right Calcaneus o Skin Prep Primary Wound Dressing Wound #1 Left Calcaneus o Silver Collagen Wound #2 Right Calcaneus o Silver Collagen Secondary Dressing Wound #1 Left Calcaneus Hunt, Errol L. ([id number]) o Dry Gauze o Boardered Foam Dressing Wound #2 Right Calcaneus o Dry Gauze o Boardered Foam Dressing Dressing Change Frequency Wound #1 Left Calcaneus o Change dressing every other day. Wound #2 Right Calcaneus o Change dressing every other day. Follow-up Appointments Wound #1 Left Calcaneus o Return Appointment in 1 week. Wound #2 Right Calcaneus o Return Appointment in 1 week. Off-Loading Wound #1 Left Calcaneus o Heel suspension boot to: - Please order prevalon boots for bilateral feet Please float heel when pt is in the bed (NO PRESSURE ON HEELS) o Turn and reposition every 2 hours Wound #2 Right Calcaneus o Heel suspension boot to: - Please order prevalon boots for bilateral feet Please float heel when pt is in the bed (NO PRESSURE ON HEELS) o Turn and reposition every 2 hours Additional Orders / Instructions Wound #1 Left Calcaneus o Increase protein intake. Wound #2 Right Calcaneus o Increase protein intake. Home Health Wound #1 Left Calcaneus o D/C Home Health Services Wound #2 Right Calcaneus o D/C Home Health Services Electronic  Signature(s) Signed: 12/23/2017 5:06:21 PM By: Roger Shelter Signed: 12/24/2017 1:56:08 AM By: Worthy Keeler PA-C Entered By: Roger Shelter on 12/23/2017 14:40:25 Quarry, Kostantinos L. ([id number]) -------------------------------------------------------------------------------- Problem List Details Patient Name: LANGLINAIS, Abraham L. Date of Service: 12/23/2017 1:30 PM Medical Record Number: [id number] Patient Account Number: [id number] Date of Birth/Sex: 1925-11-07 (84 y.o. M) Treating RN: Roger Shelter Primary Care Provider: Ramonita Lab Other Clinician: Referring Provider: Ramonita Lab Treating Provider/Extender: Melburn Hake, Nicolet Griffy Weeks in Treatment: 73 Active Problems ICD-10 Impacting Encounter Code Description Active Date Wound Healing Diagnosis L89.623 Pressure ulcer of left heel, stage 3 08/19/2017 No Yes L89.613 Pressure ulcer of right heel, stage 3 08/19/2017 No Yes I48.2 Chronic atrial fibrillation 08/19/2017 No Yes I10 Essential (primary) hypertension 08/19/2017 No Yes Z79.01 Long term (current) use of anticoagulants 08/19/2017 No Yes Inactive Problems Resolved Problems Electronic Signature(s) Signed: 12/24/2017 1:56:08 AM By: Worthy Keeler PA-C Entered By: Worthy Keeler on 12/23/2017 13:29:54 Salas, Ambrose L. ([id number]) -------------------------------------------------------------------------------- Progress Note Details Patient Name: Meikle, Decari L. Date of Service: 12/23/2017 1:30 PM Medical Record Number: [id number] Patient Account Number: [id number] Date of Birth/Sex: 07-29-1925 (84 y.o. M) Treating RN: Roger Shelter Primary Care Provider: Ramonita Lab Other Clinician: Referring Provider: Ramonita Lab Treating Provider/Extender: Melburn Hake, Birch Farino Weeks in Treatment: 18 Subjective Chief Complaint Information obtained from Patient Bilateral Heel pressure ulcers and bilateral great toe DTIs History of Present Illness (HPI) The following HPI elements were documented  for the patient's wound: Associated Signs and Symptoms: Patient has a history of chronic atrophic relation, hypertension, and long-term use of anticoagulants. He also had a fall with pelvic fracture which occurred on January 2019. 08/19/17 patient presents today for initial evaluation and our clinic concerning issues he has been having with the bilateral heels as well as the lateral great toe was since he was admitted to the nursing facility following a pelvic fracture. Initially he was spending a significant amount of time in the bed although he is now up more often since the healing process has progressed along the way obviously. When he is up he's spending most of the time sitting in a wheelchair at  this point. When he is in bed he lays on his back and the covers/sheets are pushing down on his toes bilaterally which I think it may be what's causing the issues with his deep tissue injury to the bilateral great toes. With that being said the hills also appear to be pressure in nature and I think that laying in the bed getting pressure to the sites is what has led to the formation of these on stage will pressure ulcers. Fortunately there does not appear to be any infection in both areas of ulceration of the heel appear to be stable at this point. Patient does not have any significant pain at the site she does have some of the eschar along the edges which is starting to lift up and come all hopefully slowly but surely this will occur. Patient did have arterial studies performed April 2018 which showed a normal TBI on the right knee slightly depressed TBI on the left but this does not appear to have changed significantly at that point. Specifically this was 0.75 on the right and 0.61 on the left. 09/13/17 on evaluation today patient presents for a follow-up evaluation regarding his bilateral heal ulcers. He has been tolerating the dressing changes fairly well although he did have some bleeding earlier  today when it appears some of the skin on the surrounding portion of the wound got pulled where this is starting to flake off. Subsequently there does not appear to be any bleeding right now. Since I last saw him he has been discharged from the facility and is now back at home. His wife is having a very difficult time being able to get him up and in the car in order to come for an appointment which is why they missed the last appointment have not been able to come back sooner. Fortunately the he does not have any signs of infection. No fevers chills noted. He does have difficulty walking due to pain in the bilateral heels. 10/11/17 on evaluation today patient presents for follow-up evaluation concerning his ongoing bilateral heal ulcers. He has been tolerating the dressing changes which were Betadine paints daily without complication. I have not seen him since September 13, 2017. Pressure that I saw him August 19, 2017. Subsequently those are the only prior visits before today. His left heel actually seems to have a significant amount of eschar that is starting to lift up at this point and seems to be a little more loose as far as drainage building up underneath the eschar. The right eschar though there are some parts lifting up seems to still be fairly stable which is good news. His toe ulcer is almost completely healed a lot of that eschar came off and it looks very well underneath. 12/16/17 on evaluation today patient appears to be doing extremely well in regard to his bilateral heal ulcers. His right heel shows good signs of improvement with good epithelialization. Overall the Prisma seems to be of benefit for him. In regard to the left heel he is showing more granulation tissue than previously noted obviously this is what we want to see. This also appears to be more firm and solid than previously noted. Nonetheless I still think that we should utilize the Iodoflex for a time more at this point.  Overall however I'm extremely pleased with his progress. Man[place]ang, Kacen L. ([id number]) 12/23/17 on evaluation today patient actually appears to be doing better in regard to his bilateral heal ulcers. He has been tolerating  the dressing changes without complication and in fact tells me that he is having very little discomfort and pain which is great news. Overall I'm very happy with the progress he's been making in recent weeks. Patient History Information obtained from Patient. Family History No family history of Cancer, Diabetes, Heart Disease, Hereditary Spherocytosis, Hypertension, Kidney Disease, Lung Disease, Seizures, Stroke, Thyroid Problems, Tuberculosis. Social History Former smoker, Marital Status - Married, Alcohol Use - Rarely - 2oz day, Drug Use - No History, Caffeine Use - Daily. Review of Systems (ROS) Constitutional Symptoms (General Health) Denies complaints or symptoms of Fever, Chills. Respiratory The patient has no complaints or symptoms. Cardiovascular The patient has no complaints or symptoms. Psychiatric The patient has no complaints or symptoms. Objective Constitutional Well-nourished and well-hydrated in no acute distress. Vitals Time Taken: 1:31 PM, Height: 70 in, Weight: 190 lbs, BMI: 27.3, Temperature: 97.5 F, Pulse: 70 bpm, Respiratory Rate: 16 breaths/min, Blood Pressure: 128/54 mmHg. Respiratory normal breathing without difficulty. Psychiatric this patient is able to make decisions and demonstrates good insight into disease process. Alert and Oriented x 3. pleasant and cooperative. General Notes: Patient's wound bed shows evidence of good granulation in regard to the right heel and there was just a little bit of slough that required sharp debridement on the surface of the wound. In regard to left heel this also appears to be doing significantly better he does still have more slough that required sharp debridement today but post debridement the wound  bed looks the best it has and I think he may be ready to switch to Prisma at this location as well. Integumentary (Hair, Skin) Wound #1 status is Open. Original cause of wound was Pressure Injury. The wound is located on the Left Calcaneus. The wound measures 2.6cm length x 3.1cm width x 0.2cm depth; 6.33cm^2 area and 1.266cm^3 volume. There is Fat Layer (Subcutaneous Tissue) Exposed exposed. There is no tunneling or undermining noted. There is a medium amount of Shelvin, Joseangel L. ([id number]) serosanguineous drainage noted. The wound margin is distinct with the outline attached to the wound base. There is small (1-33%) pink granulation within the wound bed. There is a large (67-100%) amount of necrotic tissue within the wound bed including Adherent Slough. The periwound skin appearance had no abnormalities noted for color. The periwound skin appearance exhibited: Excoriation. The periwound skin appearance did not exhibit: Callus, Crepitus, Induration, Rash, Scarring. Wound #2 status is Open. Original cause of wound was Pressure Injury. The wound is located on the Right Calcaneus. The wound measures 1.1cm length x 1cm width x 0.1cm depth; 0.864cm^2 area and 0.086cm^3 volume. There is Fat Layer (Subcutaneous Tissue) Exposed exposed. There is no tunneling or undermining noted. There is a small amount of drainage noted. The wound margin is distinct with the outline attached to the wound base. There is medium (34-66%) red, pink granulation within the wound bed. There is a medium (34-66%) amount of necrotic tissue within the wound bed including Adherent Slough. Assessment Active Problems ICD-10 Pressure ulcer of left heel, stage 3 Pressure ulcer of right heel, stage 3 Chronic atrial fibrillation Essential (primary) hypertension Long term (current) use of anticoagulants Procedures Wound #1 Pre-procedure diagnosis of Wound #1 is a Pressure Ulcer located on the Left Calcaneus . There was a  Excisional Skin/Subcutaneous Tissue Debridement with a total area of 8.06 sq cm performed by STONE III, Asani Mcburney E., PA-C. With the following instrument(s): Curette to remove Viable and Non-Viable tissue/material. Material removed includes Callus, Subcutaneous Tissue, Slough,  and Biofilm after achieving pain control using Other (lidocaIne 4%). No specimens were taken. A time out was conducted at 14:35, prior to the start of the procedure. A Minimum amount of bleeding was controlled with Pressure. The procedure was tolerated well with a pain level of 0 throughout and a pain level of 0 following the procedure. Patient s Level of Consciousness post procedure was recorded as Awake and Alert. Post Debridement Measurements: 2.6cm length x 3.1cm width x 0.2cm depth; 1.266cm^3 volume. Post debridement Stage noted as Category/Stage III. Character of Wound/Ulcer Post Debridement is stable. Post procedure Diagnosis Wound #1: Same as Pre-Procedure Wound #2 Pre-procedure diagnosis of Wound #2 is a Pressure Ulcer located on the Right Calcaneus . There was a Excisional Skin/Subcutaneous Tissue Debridement with a total area of 1.1 sq cm performed by STONE III, Avyukth Bontempo E., PA-C. With the following instrument(s): Curette to remove Viable and Non-Viable tissue/material. Material removed includes Callus, Subcutaneous Tissue, Slough, and Biofilm after achieving pain control using Other (lidocaIne 4%). No specimens were taken. A time out was conducted at 14:35, prior to the start of the procedure. A Minimum amount of bleeding was controlled with Pressure. The procedure was tolerated well with a pain level of 0 throughout and a pain level of 0 following the procedure. Patient s Level of Consciousness post procedure was recorded as Awake and Alert. Post Debridement Measurements: 1.1cm length x 1cm width x 0.2cm depth; 0.173cm^3 volume. Post debridement Stage noted as Category/Stage III. Character of Wound/Ulcer Post Debridement  is stable. Post procedure Diagnosis Wound #2: Same as Pre-Procedure [place], [place] ([id number]) Plan Wound Cleansing: Wound #1 Left Calcaneus: Clean wound with Normal Saline. Cleanse wound with mild soap and water May Shower, gently pat wound dry prior to applying new dressing. Wound #2 Right Calcaneus: Clean wound with Normal Saline. Cleanse wound with mild soap and water May Shower, gently pat wound dry prior to applying new dressing. Anesthetic (add to Medication List): Wound #1 Left Calcaneus: Topical Lidocaine 4% cream applied to wound bed prior to debridement (In Clinic Only). Wound #2 Right Calcaneus: Topical Lidocaine 4% cream applied to wound bed prior to debridement (In Clinic Only). Skin Barriers/Peri-Wound Care: Wound #1 Left Calcaneus: Skin Prep Wound #2 Right Calcaneus: Skin Prep Primary Wound Dressing: Wound #1 Left Calcaneus: Silver Collagen Wound #2 Right Calcaneus: Silver Collagen Secondary Dressing: Wound #1 Left Calcaneus: Dry Gauze Boardered Foam Dressing Wound #2 Right Calcaneus: Dry Gauze Boardered Foam Dressing Dressing Change Frequency: Wound #1 Left Calcaneus: Change dressing every other day. Wound #2 Right Calcaneus: Change dressing every other day. Follow-up Appointments: Wound #1 Left Calcaneus: Return Appointment in 1 week. Wound #2 Right Calcaneus: Return Appointment in 1 week. Off-Loading: Wound #1 Left Calcaneus: Heel suspension boot to: - Please order prevalon boots for bilateral feet Please float heel when pt is in the bed (NO PRESSURE ON HEELS) Turn and reposition every 2 hours Wound #2 Right Calcaneus: Heel suspension boot to: - Please order prevalon boots for bilateral feet Please float heel when pt is in the bed (NO PRESSURE ON HEELS) Turn and reposition every 2 hours Additional Orders / Instructions: Wiacek, Maurie L. ([id number]) Wound #1 Left Calcaneus: Increase protein intake. Wound #2 Right Calcaneus: Increase  protein intake. Home Health: Wound #1 Left Calcaneus: D/C Home Health Services Wound #2 Right Calcaneus: D/C Home Health Services I am going to recommend that we go ahead and initiate the above wound care orders for the next week. Hopefully this will help the  wound on the left especially to help speed along as far as healing and granulating is concerned. We will see were things stand in one weeks time. Please see above for specific wound care orders. We will see patient for re-evaluation in 1 week(s) here in the clinic. If anything worsens or changes patient will contact our office for additional recommendations. Electronic Signature(s) Signed: 12/24/2017 1:56:08 AM By: Worthy Keeler PA-C Entered By: Worthy Keeler on 12/24/2017 01:11:26 Gang, [place] ([id number]) -------------------------------------------------------------------------------- ROS/PFSH Details Patient Name: MALTER, Shon L. Date of Service: 12/23/2017 1:30 PM Medical Record Number: [id number] Patient Account Number: [id number] Date of Birth/Sex: [birth year] (84 y.o. M) Treating RN: Roger Shelter Primary Care Provider: Ramonita Lab Other Clinician: Referring Provider: Ramonita Lab Treating Provider/Extender: Melburn Hake, Jerren Flinchbaugh Weeks in Treatment: 18 Information Obtained From Patient Wound History Do you currently have one or more open woundso Yes How many open wounds do you currently haveo 2 Approximately how long have you had your woundso 1 week How have you been treating your wound(s) until nowo nothing Has your wound(s) ever healed and then re-openedo No Have you had any lab work done in the past montho No Have you tested positive for osteomyelitis (bone infection)o No Have you had any tests for circulation on your legso No Have you had other problems associated with your woundso Swelling Constitutional Symptoms (General Health) Complaints and Symptoms: Negative for: Fever; Chills Respiratory Complaints and  Symptoms: No Complaints or Symptoms Cardiovascular Complaints and Symptoms: No Complaints or Symptoms Medical History: Positive for: Arrhythmia - a-fib; Hypertension Oncologic Medical History: Positive for: Received Radiation Psychiatric Complaints and Symptoms: No Complaints or Symptoms Immunizations Pneumococcal Vaccine: Received Pneumococcal Vaccination: Yes Implantable Devices Family and Social History Corral, Quinn L. ([id number]) Cancer: No; Diabetes: No; Heart Disease: No; Hereditary Spherocytosis: No; Hypertension: No; Kidney Disease: No; Lung Disease: No; Seizures: No; Stroke: No; Thyroid Problems: No; Tuberculosis: No; Former smoker; Marital Status - Married; Alcohol Use: Rarely - 2oz day; Drug Use: No History; Caffeine Use: Daily; Financial Concerns: No; Food, Clothing or Shelter Needs: No; Support System Lacking: No; Transportation Concerns: No; Advanced Directives: Yes (Not Provided); Patient does not want information on Advanced Directives; Do not resuscitate: No; Living Will: Yes (Not Provided); Medical Power of Attorney: No Physician Affirmation I have reviewed and agree with the above information. Electronic Signature(s) Signed: 12/24/2017 1:56:08 AM By: Worthy Keeler PA-C Signed: 12/24/2017 12:50:22 PM By: Roger Shelter Entered By: Worthy Keeler on 12/24/2017 01:10:46 Gittings, [place] ([id number]) -------------------------------------------------------------------------------- SuperBill Details Patient Name: STEIN, Glen L. Date of Service: 12/23/2017 Medical Record Number: [id number] Patient Account Number: [id number] Date of Birth/Sex: 09/06/1925 (84 y.o. M) Treating RN: Roger Shelter Primary Care Provider: Ramonita Lab Other Clinician: Referring Provider: Ramonita Lab Treating Provider/Extender: Melburn Hake, Acacia Latorre Weeks in Treatment: 18 Diagnosis Coding ICD-10 Codes Code Description [phone] Pressure ulcer of left heel, stage 3 L89.613 Pressure  ulcer of right heel, stage 3 I48.2 Chronic atrial fibrillation I10 Essential (primary) hypertension Z79.01 Long term (current) use of anticoagulants Facility Procedures CPT4 Code: [id number] Description: 29937 - DEB SUBQ TISSUE 20 SQ CM/< ICD-10 Diagnosis Description L89.623 Pressure ulcer of left heel, stage 3 L89.613 Pressure ulcer of right heel, stage 3 Modifier: Quantity: 1 Physician Procedures CPT4 Code: 1696789 Description: 11042 - WC PHYS SUBQ TISS 20 SQ CM ICD-10 Diagnosis Description L89.623 Pressure ulcer of left heel, stage 3 L89.613 Pressure ulcer of right heel, stage 3 Modifier: Quantity: 1 Electronic Signature(s) Signed: 12/24/2017 1:56:08 AM By:  Melburn Hake, Bracen Schum PA-C Entered By: Worthy Keeler on 12/24/2017 01:11:47

## 2017-12-30 ENCOUNTER — Encounter: Payer: PPO | Admitting: Physician Assistant

## 2017-12-30 ENCOUNTER — Other Ambulatory Visit: Payer: Self-pay | Admitting: *Deleted

## 2017-12-30 ENCOUNTER — Encounter: Payer: Self-pay | Admitting: *Deleted

## 2017-12-30 DIAGNOSIS — L89613 Pressure ulcer of right heel, stage 3: Secondary | ICD-10-CM | POA: Diagnosis not present

## 2017-12-30 DIAGNOSIS — L89623 Pressure ulcer of left heel, stage 3: Secondary | ICD-10-CM | POA: Diagnosis not present

## 2017-12-30 NOTE — Patient Outreach (Signed)
[name]) Care Management   12/30/2017  [name] [birth year] [id number]  [name] is an 84 y.o. male   84year old male, on 1/7 ED admission after fall at home noted pubic rami fracture, no surgical plan, WBAT on RLE.  Discharged to [facility] on 1/9, DC to home on 08/23/17.  PMH includes : Hypertension , chronic atrial fib, bilateral heel ulcers, PVD, osteoarthritis.   5/22 Referral from [facility] utilization management department : To recommend intervention to clarify how to best the plan can can assist in meeting member needs.   Subjective:    Patient reports feeling pretty good.  Wife reports patient with healing of bilateral heels, and at this time they continue to visit wound center weekly has a visit on today  and with plans to spread out visits as he continues to show progress with healing.   Wife reports they have all the supplies that they need and the  wound clinic has assisted with ordering needed supplies for home delivery.   Wife discussed patient has not had coumadin check since last month she plans to call for a monthly lab draw.   Objective: BP 122/70 (BP Location: Left Arm, Patient Position: Sitting, Cuff Size: Normal)   Pulse 72   Resp 18   SpO2 97%  Review of Systems  HENT: Negative.   Eyes: Negative.   Respiratory: Negative.   Cardiovascular: Negative.   Gastrointestinal: Negative.   Genitourinary: Negative.   Musculoskeletal: Negative.   Skin: Negative.   Neurological: Negative.   Endo/Heme/Allergies: Negative.   Psychiatric/Behavioral: Negative.     Physical Exam  Constitutional: He is oriented to person, place, and time. He appears well-developed and well-nourished.  Cardiovascular: Normal rate and normal heart sounds.  Respiratory: Effort normal and breath sounds normal.  GI: Soft.  Neurological: He is alert and oriented to person, place, and time.  Skin: Skin is warm and dry.     Psychiatric:  He has a normal mood and affect. His behavior is normal. Judgment and thought content normal.    Encounter Medications:   Outpatient Encounter Medications as of 12/30/2017  Medication Sig Note  . amLODipine (NORVASC) 5 MG tablet    . bisacodyl (DULCOLAX) 5 MG EC tablet Take 1 tablet (5 mg total) by mouth daily as needed for moderate constipation. (Patient not taking: Reported on 08/30/2017)   . cephALEXin (KEFLEX) 500 MG capsule Take 500 mg by mouth 3 (three) times daily.   . Cholecalciferol (VITAMIN D-3 PO) Take by mouth.   . diphenhydramine-acetaminophen (TYLENOL PM) 25-500 MG TABS tablet 1-1/2 tablets at bedtime.   Marland Kitchen HYDROcodone-acetaminophen (NORCO/VICODIN) 5-325 MG tablet Take 1-2 tablets by mouth every 4 (four) hours as needed for moderate pain. (Patient not taking: Reported on 08/30/2017)   . lovastatin (MEVACOR) 40 MG tablet Take by mouth at bedtime.   . methocarbamol (ROBAXIN) 500 MG tablet Take 1 tablet (500 mg total) by mouth every 6 (six) hours as needed for muscle spasms. (Patient not taking: Reported on 08/30/2017)   . Multiple Vitamin (MULTIVITAMIN) capsule Take 1 capsule by mouth daily.   . pantoprazole (PROTONIX) 40 MG tablet TAKE 1 TABLET BY MOUTH EVERY DAY   . terazosin (HYTRIN) 2 MG capsule Take 2 mg by mouth at bedtime. 2TABS DAILY   . vitamin B-12 (CYANOCOBALAMIN) 1000 MCG tablet Take 1,000 mcg by mouth daily.   Marland Kitchen warfarin (COUMADIN) 2 MG tablet Take 4 mg by mouth daily. 2  TABS DAILY  08/30/2017: Currently taking '3mg'$  daily   No facility-administered encounter medications on file as of 12/30/2017.     Functional Status:   In your present state of health, do you have any difficulty performing the following activities: 09/03/2017 07/15/2017  Hearing? Y N  Vision? N N  Difficulty concentrating or making decisions? N N  Walking or climbing stairs? Y N  Comment unable, home health PT working with patient  -  Dressing or bathing? Y N  Comment wife helps  -  Doing errands,  shopping? Y N  Comment wife drives, decreased mobility at this time  -  Conservation officer, nature and eating ? N -  Comment wife assist  -  Using the Toilet? Y -  Comment assist from wife  -  In the past six months, have you accidently leaked urine? Y -  Do you have problems with loss of bowel control? N -  Managing your Medications? Y -  Comment wife helps  -  Managing your Finances? N -  Housekeeping or managing your Housekeeping? N -  Comment wife does house work.  -  Some recent data might be hidden    Fall/Depression Screening:    Fall Risk  09/03/2017  Falls in the past year? Yes  Number falls in past yr: 2 or more  Injury with Fall? Yes  Risk Factor Category  High Fall Risk  Risk for fall due to : History of fall(s);Impaired balance/gait  Follow up Falls prevention discussed   PHQ 2/9 Scores 09/05/2017  PHQ - 2 Score 0     Assessment: Routine home visit   History of Fall - no recurrent fall, using rollator for increased tolerance of  mobility in home.  Heel wound - progressive healing, continued wound care visits, has needed supplies for wound care, wife managing home wound care as prescribed. . Patient drinking nutrition supplement ensure or boost daily.   Advanced directive - patient does have living well.  Patient taking medications as prescribed.   Wife denies any new concerns, states they are managing well at home, goals met, discussed case closure with wife and she  agreeable. Provided wife again with [facility] care management contact info for further . Living will scanned for entry in [place] .   Plan:  Will close case goals have been met.  Will send PCP case closure letter.    Joylene Draft, RN, [facility] Management Coordinator  [phone]- Mobile [phone]- Toll Free Main Office

## 2017-12-31 NOTE — Progress Notes (Signed)
Coomer, Octavious L. ([id number]) Visit Report for 12/30/2017 Arrival Information Details Patient Name: [name], [name] Date of Service: 12/30/2017 3:15 PM Medical Record Number: [id number] Patient Account Number: [id number] Date of Birth/Sex: 1926-02-23 (84 y.o. M) Treating RN: Montey Hora Primary Care Abrianna Sidman: Ramonita Lab Other Clinician: Referring Lurine Imel: Ramonita Lab Treating Lael Pilch/Extender: Melburn Hake, HOYT Weeks in Treatment: 74 Visit Information History Since Last Visit Added or deleted any medications: No Patient Arrived: Walker Any new allergies or adverse reactions: No Arrival Time: 15:28 Had a fall or experienced change in No Accompanied By: spouse activities of daily living that may affect Transfer Assistance: None risk of falls: Patient Identification Verified: Yes Signs or symptoms of abuse/neglect since last visito No Secondary Verification Process Yes Hospitalized since last visit: No Completed: Implantable device outside of the clinic excluding No Patient Requires Transmission-Based No cellular tissue based products placed in the center Precautions: since last visit: Patient Has Alerts: Yes Has Dressing in Place as Prescribed: Yes Patient Alerts: Patient on Blood Pain Present Now: No Thinner Warfarin Electronic Signature(s) Signed: 12/30/2017 4:53:29 PM By: Montey Hora Entered By: Montey Hora on 12/30/2017 15:28:23 Yodice, Adell L. ([id number]) -------------------------------------------------------------------------------- Encounter Discharge Information Details Patient Name: [name], [name]. Date of Service: 12/30/2017 3:15 PM Medical Record Number: [id number] Patient Account Number: [id number] Date of Birth/Sex: 05-05-1926 (84 y.o. M) Treating RN: Cornell Barman Primary Care Luwanda Starr: Ramonita Lab Other Clinician: Referring Helton Oleson: Ramonita Lab Treating Blanche Gallien/Extender: Melburn Hake, HOYT Weeks in Treatment: 78 Encounter Discharge Information  Items Discharge Condition: Stable Ambulatory Status: Walker Discharge Destination: Home Transportation: Private Auto Accompanied By: wife Schedule Follow-up Appointment: Yes Clinical Summary of Care: Electronic Signature(s) Signed: 12/30/2017 5:13:00 PM By: Gretta Cool, BSN, RN, CWS, Kim RN, BSN Entered By: Gretta Cool, BSN, RN, CWS, Kim on 12/30/2017 17:12:59 Maheu, Isaiha L. ([id number]) -------------------------------------------------------------------------------- Lower Extremity Assessment Details Patient Name: Threat, [name] Date of Service: 12/30/2017 3:15 PM Medical Record Number: [id number] Patient Account Number: [id number] Date of Birth/Sex: 1925-09-27 (84 y.o. M) Treating RN: Montey Hora Primary Care Karla Vines: Ramonita Lab Other Clinician: Referring Indie Nickerson: Ramonita Lab Treating Teandra Harlan/Extender: Melburn Hake, HOYT Weeks in Treatment: 19 Vascular Assessment Pulses: Dorsalis Pedis Palpable: [Left:Yes] [Right:Yes] Posterior Tibial Extremity colors, hair growth, and conditions: Extremity Color: [Left:Normal] [Right:Normal] Hair Growth on Extremity: [Left:No] [Right:No] Temperature of Extremity: [Left:Warm] [Right:Warm] Capillary Refill: [Left:< 3 seconds] [Right:< 3 seconds] Toe Nail Assessment Left: Right: Thick: Yes Yes Discolored: Yes Yes Deformed: No No Improper Length and Hygiene: No No Electronic Signature(s) Signed: 12/30/2017 4:53:29 PM By: Montey Hora Entered By: Montey Hora on 12/30/2017 15:37:15 Mulford, Emmette L. ([id number]) -------------------------------------------------------------------------------- Multi Wound Chart Details Patient Name: DYKE, [name] Date of Service: 12/30/2017 3:15 PM Medical Record Number: [id number] Patient Account Number: [id number] Date of Birth/Sex: 11-12-1925 (84 y.o. M) Treating RN: Roger Shelter Primary Care Aracely Rickett: Ramonita Lab Other Clinician: Referring Etrulia Zarr: Ramonita Lab Treating Joni Norrod/Extender: Melburn Hake,  HOYT Weeks in Treatment: 19 Vital Signs Height(in): 70 Pulse(bpm): 71 Weight(lbs): 190 Blood Pressure(mmHg): 120/55 Body Mass Index(BMI): 27 Temperature(F): 97.6 Respiratory Rate 16 (breaths/min): Photos: [1:No Photos] [2:No Photos] [N/A:N/A] Wound Location: [1:Left Calcaneus] [2:Right Calcaneus] [N/A:N/A] Wounding Event: [1:Pressure Injury] [2:Pressure Injury] [N/A:N/A] Primary Etiology: [1:Pressure Ulcer] [2:Pressure Ulcer] [N/A:N/A] Comorbid History: [1:Arrhythmia, Hypertension, Received Radiation] [2:Arrhythmia, Hypertension, Received Radiation] [N/A:N/A] Date Acquired: [1:08/12/2017] [2:08/12/2017] [N/A:N/A] Weeks of Treatment: [1:19] [2:19] [N/A:N/A] Wound Status: [1:Open] [2:Open] [N/A:N/A] Measurements L x W x D [1:2.1x2.4x0.2] [2:1.1x0.8x0.1] [N/A:N/A] (cm) Area (cm) : [1:3.958] [2:0.691] [N/A:N/A] Volume (cm) : [1:0.792] [2:0.069] [N/A:N/A] % Reduction in  Area: [1:82.70%] [2:94.00%] [N/A:N/A] % Reduction in Volume: [1:82.70%] [2:97.00%] [N/A:N/A] Classification: [1:Category/Stage III] [2:Category/Stage III] [N/A:N/A] Exudate Amount: [1:Medium] [2:Small] [N/A:N/A] Exudate Type: [1:Serosanguineous] [2:N/A] [N/A:N/A] Exudate Color: [1:red, brown] [2:N/A] [N/A:N/A] Wound Margin: [1:Distinct, outline attached] [2:Distinct, outline attached] [N/A:N/A] Granulation Amount: [1:Medium (34-66%)] [2:Large (67-100%)] [N/A:N/A] Granulation Quality: [1:Pink] [2:Red, Pink] [N/A:N/A] Necrotic Amount: [1:Medium (34-66%)] [2:Small (1-33%)] [N/A:N/A] Exposed Structures: [1:Fat Layer (Subcutaneous Tissue) Exposed: Yes Fascia: No Tendon: No Muscle: No Joint: No Bone: No] [2:Fat Layer (Subcutaneous Tissue) Exposed: Yes Fascia: No Tendon: No Muscle: No Joint: No Bone: No] [N/A:N/A] Epithelialization: [1:None] [2:None] [N/A:N/A] Periwound Skin Texture: [1:Excoriation: Yes Induration: No Callus: No Crepitus: No Rash: No Scarring: No] [2:Excoriation: No Induration: No Callus: No Crepitus: No  Rash: No Scarring: No] [N/A:N/A] Periwound Skin Moisture: No Abnormalities Noted Maceration: No N/A Dry/Scaly: No Periwound Skin Color: No Abnormalities Noted Atrophie Blanche: No N/A Cyanosis: No Ecchymosis: No Erythema: No Hemosiderin Staining: No Mottled: No Pallor: No Rubor: No Temperature: N/A No Abnormality N/A Tenderness on Palpation: No No N/A Wound Preparation: Ulcer Cleansing: Ulcer Cleansing: N/A Rinsed/Irrigated with Saline Rinsed/Irrigated with Saline Topical Anesthetic Applied: Topical Anesthetic Applied: Other: lidocaine 4% Other: lidocaine 4% Treatment Notes Electronic Signature(s) Signed: 12/30/2017 4:53:36 PM By: Roger Shelter Entered By: Roger Shelter on 12/30/2017 15:44:13 Derasmo, Asotin ([id number]) -------------------------------------------------------------------------------- [place] Details Patient Name: COYT, GOVONI. Date of Service: 12/30/2017 3:15 PM Medical Record Number: [id number] Patient Account Number: [id number] Date of Birth/Sex: Jan 27, 1926 (84 y.o. M) Treating RN: Roger Shelter Primary Care Altamese Deguire: Ramonita Lab Other Clinician: Referring Yudith Norlander: Ramonita Lab Treating Robbert Langlinais/Extender: Melburn Hake, HOYT Weeks in Treatment: 75 Active Inactive ` Abuse / Safety / Falls / Self Care Management Nursing Diagnoses: History of Falls Potential for falls Goals: Patient will not experience any injury related to falls Date Initiated: 08/19/2017 Target Resolution Date: 12/14/2017 Goal Status: Active Interventions: Assess Activities of Daily Living upon admission and as needed Assess fall risk on admission and as needed Assess: immobility, friction, shearing, incontinence upon admission and as needed Assess impairment of mobility on admission and as needed per policy Assess personal safety and home safety (as indicated) on admission and as needed Assess self care needs on admission and as  needed Notes: ` Nutrition Nursing Diagnoses: Imbalanced nutrition Potential for alteratiion in Nutrition/Potential for imbalanced nutrition Goals: Patient/caregiver agrees to and verbalizes understanding of need to use nutritional supplements and/or vitamins as prescribed Date Initiated: 08/19/2017 Target Resolution Date: 11/16/2017 Goal Status: Active Interventions: Assess patient nutrition upon admission and as needed per policy Notes: ` Orientation to the Wound Care Program Nursing Diagnoses: Kuper, Luismiguel L. ([id number]) Knowledge deficit related to the wound healing center program Goals: Patient/caregiver will verbalize understanding of the [place] Date Initiated: 08/19/2017 Target Resolution Date: 09/14/2017 Goal Status: Active Interventions: Provide education on orientation to the wound center Notes: ` Pain, Acute or Chronic Nursing Diagnoses: Pain, acute or chronic: actual or potential Potential alteration in comfort, pain Goals: Patient/caregiver will verbalize adequate pain control between visits Date Initiated: 08/19/2017 Target Resolution Date: 12/14/2017 Goal Status: Active Interventions: Complete pain assessment as per visit requirements Notes: ` Pressure Nursing Diagnoses: Knowledge deficit related to causes and risk factors for pressure ulcer development Knowledge deficit related to management of pressures ulcers Potential for impaired tissue integrity related to pressure, friction, moisture, and shear Goals: Patient will remain free from development of additional pressure ulcers Date Initiated: 08/19/2017 Target Resolution Date: 12/14/2017 Goal Status: Active Interventions: Assess: immobility, friction, shearing, incontinence upon  admission and as needed Assess potential for pressure ulcer upon admission and as needed Notes: ` Wound/Skin Impairment Nursing Diagnoses: Impaired tissue integrity Knowledge deficit related to  ulceration/compromised skin integrity Trang, Demauri L. ([id number]) Goals: Ulcer/skin breakdown will have a volume reduction of 80% by week 12 Date Initiated: 08/19/2017 Target Resolution Date: 12/14/2017 Goal Status: Active Interventions: Assess patient/caregiver ability to perform ulcer/skin care regimen upon admission and as needed Assess ulceration(s) every visit Notes: Electronic Signature(s) Signed: 12/30/2017 4:53:36 PM By: Roger Shelter Entered By: Roger Shelter on 12/30/2017 15:44:00 Slane, Gevork L. ([id number]) -------------------------------------------------------------------------------- Pain Assessment Details Patient Name: BOCOCK, Clevland L. Date of Service: 12/30/2017 3:15 PM Medical Record Number: [id number] Patient Account Number: [id number] Date of Birth/Sex: [birth year] (84 y.o. M) Treating RN: Montey Hora Primary Care Mattilyn Crites: Ramonita Lab Other Clinician: Referring Belvia Gotschall: Ramonita Lab Treating Alajiah Dutkiewicz/Extender: Melburn Hake, HOYT Weeks in Treatment: 10 Active Problems Location of Pain Severity and Description of Pain Patient Has Paino Yes Site Locations Pain Location: Pain in Ulcers With Dressing Change: Yes Duration of the Pain. Constant / Intermittento Intermittent Character of Pain Describe the Pain: Other: tender Pain Management and Medication Current Pain Management: Electronic Signature(s) Signed: 12/30/2017 4:53:29 PM By: Montey Hora Entered By: Montey Hora on 12/30/2017 15:28:45 [name], Leovardo L. ([id number]) -------------------------------------------------------------------------------- Patient/Caregiver Education Details Patient Name: KAVIR, SAVOCA L. Date of Service: 12/30/2017 3:15 PM Medical Record Number: [id number] Patient Account Number: [id number] Date of Birth/Gender: 06-21-1926 (84 y.o. M) Treating RN: Cornell Barman Primary Care Physician: Ramonita Lab Other Clinician: Referring Physician: Ramonita Lab Treating  Physician/Extender: Sharalyn Ink in Treatment: 85 Education Assessment Education Provided To: Patient Education Topics Provided Wound/Skin Impairment: Handouts: Caring for Your Ulcer Methods: Demonstration, Explain/Verbal Responses: State content correctly Electronic Signature(s) Signed: 12/30/2017 5:17:30 PM By: Gretta Cool, BSN, RN, CWS, Kim RN, BSN Entered By: Gretta Cool, BSN, RN, CWS, Kim on 12/30/2017 17:13:13 Banka, Roen L. ([id number]) -------------------------------------------------------------------------------- Wound Assessment Details Patient Name: ARNALL, Hezikiah L. Date of Service: 12/30/2017 3:15 PM Medical Record Number: [id number] Patient Account Number: [id number] Date of Birth/Sex: 10/29/1925 (84 y.o. M) Treating RN: Montey Hora Primary Care Asad Keeven: Ramonita Lab Other Clinician: Referring Jerald Hennington: Ramonita Lab Treating Ariauna Farabee/Extender: Melburn Hake, HOYT Weeks in Treatment: 19 Wound Status Wound Number: 1 Primary Etiology: Pressure Ulcer Wound Location: Left Calcaneus Wound Status: Open Wounding Event: Pressure Injury Comorbid Arrhythmia, Hypertension, Received History: Radiation Date Acquired: 08/12/2017 Weeks Of Treatment: 19 Clustered Wound: No Photos Photo Uploaded By: Montey Hora on 12/30/2017 15:58:58 Wound Measurements Length: (cm) 2.1 Width: (cm) 2.4 Depth: (cm) 0.2 Area: (cm) 3.958 Volume: (cm) 0.792 % Reduction in Area: 82.7% % Reduction in Volume: 82.7% Epithelialization: None Tunneling: No Undermining: No Wound Description Classification: Category/Stage III Wound Margin: Distinct, outline attached Exudate Amount: Medium Exudate Type: Serosanguineous Exudate Color: red, brown Foul Odor After Cleansing: No Slough/Fibrino Yes Wound Bed Granulation Amount: Medium (34-66%) Exposed Structure Granulation Quality: Pink Fascia Exposed: No Necrotic Amount: Medium (34-66%) Fat Layer (Subcutaneous Tissue) Exposed: Yes Necrotic  Quality: Adherent Slough Tendon Exposed: No Muscle Exposed: No Joint Exposed: No Bone Exposed: No Periwound Skin Texture Skop, Chandan L. ([id number]) Texture Color No Abnormalities Noted: No No Abnormalities Noted: Yes Callus: No Crepitus: No Excoriation: Yes Induration: No Rash: No Scarring: No Moisture No Abnormalities Noted: No Wound Preparation Ulcer Cleansing: Rinsed/Irrigated with Saline Topical Anesthetic Applied: Other: lidocaine 4%, Treatment Notes Wound #1 (Left Calcaneus) 1. Cleansed with: Clean wound with Normal Saline 2. Anesthetic Topical Lidocaine 4% cream to wound bed prior to debridement 4. Dressing  Applied: Prisma Ag 5. Secondary Dressing Applied Bordered Foam Dressing 6. Footwear/Offloading device applied Other footwear/offloading device applied (specify in notes) Electronic Signature(s) Signed: 12/30/2017 4:53:29 PM By: Montey Hora Entered By: Montey Hora on 12/30/2017 15:35:56 Arista, Brydan L. ([id number]) -------------------------------------------------------------------------------- Wound Assessment Details Patient Name: MARBAN, Patric L. Date of Service: 12/30/2017 3:15 PM Medical Record Number: [id number] Patient Account Number: [id number] Date of Birth/Sex: 23-Mar-1926 (84 y.o. M) Treating RN: Montey Hora Primary Care Tanji Storrs: Ramonita Lab Other Clinician: Referring Tywaun Hiltner: Ramonita Lab Treating Shadee Rathod/Extender: Melburn Hake, HOYT Weeks in Treatment: 19 Wound Status Wound Number: 2 Primary Etiology: Pressure Ulcer Wound Location: Right Calcaneus Wound Status: Open Wounding Event: Pressure Injury Comorbid Arrhythmia, Hypertension, Received History: Radiation Date Acquired: 08/12/2017 Weeks Of Treatment: 19 Clustered Wound: No Photos Photo Uploaded By: Montey Hora on 12/30/2017 15:58:59 Wound Measurements Length: (cm) 1.1 Width: (cm) 0.8 Depth: (cm) 0.1 Area: (cm) 0.691 Volume: (cm) 0.069 % Reduction in Area: 94% %  Reduction in Volume: 97% Epithelialization: None Tunneling: No Undermining: No Wound Description Classification: Category/Stage III Wound Margin: Distinct, outline attached Exudate Amount: Small Foul Odor After Cleansing: No Slough/Fibrino Yes Wound Bed Granulation Amount: Large (67-100%) Exposed Structure Granulation Quality: Red, Pink Fascia Exposed: No Necrotic Amount: Small (1-33%) Fat Layer (Subcutaneous Tissue) Exposed: Yes Necrotic Quality: Adherent Slough Tendon Exposed: No Muscle Exposed: No Joint Exposed: No Bone Exposed: No Periwound Skin Texture Texture Color No Abnormalities Noted: No No Abnormalities Noted: No Vitali, [place] ([id number]) Callus: No Atrophie Blanche: No Crepitus: No Cyanosis: No Excoriation: No Ecchymosis: No Induration: No Erythema: No Rash: No Hemosiderin Staining: No Scarring: No Mottled: No Pallor: No Moisture Rubor: No No Abnormalities Noted: No Dry / Scaly: No Temperature / Pain Maceration: No Temperature: No Abnormality Wound Preparation Ulcer Cleansing: Rinsed/Irrigated with Saline Topical Anesthetic Applied: Other: lidocaine 4%, Treatment Notes Wound #2 (Right Calcaneus) 1. Cleansed with: Clean wound with Normal Saline 2. Anesthetic Topical Lidocaine 4% cream to wound bed prior to debridement 4. Dressing Applied: Prisma Ag 5. Secondary Dressing Applied Bordered Foam Dressing 6. Footwear/Offloading device applied Other footwear/offloading device applied (specify in notes) Electronic Signature(s) Signed: 12/30/2017 4:53:29 PM By: Montey Hora Entered By: Montey Hora on 12/30/2017 15:36:17 Kitts, Brion L. ([id number]) -------------------------------------------------------------------------------- Vitals Details Patient Name: BURMESTER, Leanord L. Date of Service: 12/30/2017 3:15 PM Medical Record Number: [id number] Patient Account Number: [id number] Date of Birth/Sex: Nov 25, 1925 (84 y.o. M) Treating RN: Montey Hora Primary Care Artavia Jeanlouis: Ramonita Lab Other Clinician: Referring Kelsay Haggard: Ramonita Lab Treating Merrel Crabbe/Extender: Melburn Hake, HOYT Weeks in Treatment: 19 Vital Signs Time Taken: 15:31 Temperature (F): 97.6 Height (in): 70 Pulse (bpm): 71 Weight (lbs): 190 Respiratory Rate (breaths/min): 16 Body Mass Index (BMI): 27.3 Blood Pressure (mmHg): 120/55 Reference Range: 80 - 120 mg / dl Electronic Signature(s) Signed: 12/30/2017 4:53:29 PM By: Montey Hora Entered By: Montey Hora on 12/30/2017 15:32:05

## 2017-12-31 NOTE — Progress Notes (Signed)
[place], Codylee L. ([id number]) Visit Report for 12/30/2017 Chief Complaint Document Details Patient Name: [name], [name] Date of Service: 12/30/2017 3:15 PM Medical Record Number: [id number] Patient Account Number: [id number] Date of Birth/Sex: 1926-02-06 (84 y.o. M) Treating RN: Roger Shelter Primary Care Provider: Ramonita Lab Other Clinician: Referring Provider: Ramonita Lab Treating Provider/Extender: Melburn Hake, Kaveh Kissinger Weeks in Treatment: 23 Information Obtained from: Patient Chief Complaint Bilateral Heel pressure ulcers and bilateral great toe DTIs Electronic Signature(s) Signed: 12/30/2017 11:25:57 PM By: Worthy Keeler PA-C Entered By: Worthy Keeler on 12/30/2017 15:30:41 Keelan, [place] ([id number]) -------------------------------------------------------------------------------- Debridement Details Patient Name: [name], [name] Date of Service: 12/30/2017 3:15 PM Medical Record Number: [id number] Patient Account Number: [id number] Date of Birth/Sex: Dec 16, 1925 (84 y.o. M) Treating RN: Roger Shelter Primary Care Provider: Ramonita Lab Other Clinician: Referring Provider: Ramonita Lab Treating Provider/Extender: Melburn Hake, Rube Sanchez Weeks in Treatment: 19 Debridement Performed for Wound #2 Right Calcaneus Assessment: Performed By: Physician STONE III, Karrigan Messamore E., PA-C Debridement Type: Debridement Pre-procedure Verification/Time Yes - 15:46 Out Taken: Start Time: 15:46 Pain Control: Other : lidocaine 4% Total Area Debrided (L x W): 1.1 (cm) x 0.8 (cm) = 0.88 (cm) Tissue and other material Viable, Non-Viable, Slough, Subcutaneous, Biofilm, Slough debrided: Level: Skin/Subcutaneous Tissue Debridement Description: Excisional Instrument: Curette Bleeding: Minimum Hemostasis Achieved: Pressure End Time: 15:47 Procedural Pain: 0 Post Procedural Pain: 0 Response to Treatment: Procedure was tolerated well Level of Consciousness: Awake and Alert Post Procedure  Vitals: Temperature: 97.6 Pulse: 71 Respiratory Rate: 16 Blood Pressure: Systolic Blood Pressure: 063 Diastolic Blood Pressure: 55 Post Debridement Measurements of Total Wound Length: (cm) 1.1 Stage: Category/Stage III Width: (cm) 0.8 Depth: (cm) 0.2 Volume: (cm) 0.138 Character of Wound/Ulcer Post Stable Debridement: Post Procedure Diagnosis Same as Pre-procedure Electronic Signature(s) Signed: 12/30/2017 4:53:36 PM By: Roger Shelter Signed: 12/30/2017 11:25:57 PM By: Worthy Keeler PA-C Entered By: Roger Shelter on 12/30/2017 15:47:38 Dugdale, Chaos L. ([id number]) Gar[place], [place] ([id number]) -------------------------------------------------------------------------------- Debridement Details Patient Name: [name], [name] Date of Service: 12/30/2017 3:15 PM Medical Record Number: [id number] Patient Account Number: [id number] Date of Birth/Sex: 12/15/1925 (84 y.o. M) Treating RN: Roger Shelter Primary Care Provider: Ramonita Lab Other Clinician: Referring Provider: Ramonita Lab Treating Provider/Extender: Melburn Hake, Alekzander Cardell Weeks in Treatment: 19 Debridement Performed for Wound #1 Left Calcaneus Assessment: Performed By: Physician STONE III, Roxene Alviar E., PA-C Debridement Type: Debridement Pre-procedure Verification/Time Yes - 15:46 Out Taken: Start Time: 15:46 Pain Control: Other : lidocaine 4% Total Area Debrided (L x W): 2.1 (cm) x 2.4 (cm) = 5.04 (cm) Tissue and other material Viable, Non-Viable, Slough, Subcutaneous, Biofilm, Slough debrided: Level: Skin/Subcutaneous Tissue Debridement Description: Excisional Instrument: Curette Bleeding: Minimum Hemostasis Achieved: Pressure End Time: 15:47 Procedural Pain: 0 Post Procedural Pain: 0 Response to Treatment: Procedure was tolerated well Level of Consciousness: Awake and Alert Post Procedure Vitals: Temperature: 97.6 Pulse: 71 Respiratory Rate: 16 Blood Pressure: Systolic Blood Pressure:  628 Diastolic Blood Pressure: 55 Post Debridement Measurements of Total Wound Length: (cm) 2.1 Stage: Category/Stage III Width: (cm) 2.4 Depth: (cm) 0.3 Volume: (cm) 1.188 Character of Wound/Ulcer Post Stable Debridement: Post Procedure Diagnosis Same as Pre-procedure Electronic Signature(s) Signed: 12/30/2017 4:53:36 PM By: Roger Shelter Signed: 12/30/2017 11:25:57 PM By: Worthy Keeler PA-C Entered By: Roger Shelter on 12/30/2017 15:48:30 [name], Yaser L. ([id number]) Geeslin, [place] ([id number]) -------------------------------------------------------------------------------- HPI Details Patient Name: [name], [name] Date of Service: 12/30/2017 3:15 PM Medical Record Number: [id number] Patient Account Number: [id number] Date of Birth/Sex: Jul 07, 1926 (84 y.o.  M) Treating RN: Roger Shelter Primary Care Provider: Ramonita Lab Other Clinician: Referring Provider: Ramonita Lab Treating Provider/Extender: Melburn Hake, Lauris Serviss Weeks in Treatment: 19 History of Present Illness Associated Signs and Symptoms: Patient has a history of chronic atrophic relation, hypertension, and long-term use of anticoagulants. He also had a fall with pelvic fracture which occurred on January 2019. HPI Description: 08/19/17 patient presents today for initial evaluation and our clinic concerning issues he has been having with the bilateral heels as well as the lateral great toe was since he was admitted to the nursing facility following a pelvic fracture. Initially he was spending a significant amount of time in the bed although he is now up more often since the healing process has progressed along the way obviously. When he is up he's spending most of the time sitting in a wheelchair at this point. When he is in bed he lays on his back and the covers/sheets are pushing down on his toes bilaterally which I think it may be what's causing the issues with his deep tissue injury to the bilateral great toes.  With that being said the hills also appear to be pressure in nature and I think that laying in the bed getting pressure to the sites is what has led to the formation of these on stage will pressure ulcers. Fortunately there does not appear to be any infection in both areas of ulceration of the heel appear to be stable at this point. Patient does not have any significant pain at the site she does have some of the eschar along the edges which is starting to lift up and come all hopefully slowly but surely this will occur. Patient did have arterial studies performed April 2018 which showed a normal TBI on the right knee slightly depressed TBI on the left but this does not appear to have changed significantly at that point. Specifically this was 0.75 on the right and 0.61 on the left. 09/13/17 on evaluation today patient presents for a follow-up evaluation regarding his bilateral heal ulcers. He has been tolerating the dressing changes fairly well although he did have some bleeding earlier today when it appears some of the skin on the surrounding portion of the wound got pulled where this is starting to flake off. Subsequently there does not appear to be any bleeding right now. Since I last saw him he has been discharged from the facility and is now back at home. His wife is having a very difficult time being able to get him up and in the car in order to come for an appointment which is why they missed the last appointment have not been able to come back sooner. Fortunately the he does not have any signs of infection. No fevers chills noted. He does have difficulty walking due to pain in the bilateral heels. 10/11/17 on evaluation today patient presents for follow-up evaluation concerning his ongoing bilateral heal ulcers. He has been tolerating the dressing changes which were Betadine paints daily without complication. I have not seen him since September 13, 2017. Pressure that I saw him August 19, 2017.  Subsequently those are the only prior visits before today. His left heel actually seems to have a significant amount of eschar that is starting to lift up at this point and seems to be a little more loose as far as drainage building up underneath the eschar. The right eschar though there are some parts lifting up seems to still be fairly stable which is good  news. His toe ulcer is almost completely healed a lot of that eschar came off and it looks very well underneath. 12/16/17 on evaluation today patient appears to be doing extremely well in regard to his bilateral heal ulcers. His right heel shows good signs of improvement with good epithelialization. Overall the Prisma seems to be of benefit for him. In regard to the left heel he is showing more granulation tissue than previously noted obviously this is what we want to see. This also appears to be more firm and solid than previously noted. Nonetheless I still think that we should utilize the Iodoflex for a time more at this point. Overall however I'm extremely pleased with his progress. 12/23/17 on evaluation today patient actually appears to be doing better in regard to his bilateral heal ulcers. He has been tolerating the dressing changes without complication and in fact tells me that he is having very little discomfort and pain which is great news. Overall I'm very happy with the progress he's been making in recent weeks. 12/30/17 on evaluation today patient appears to show signs of improvement in regard to his ulcers of the bilateral lower extremities especially the right. Really both are already doing much better. The patient has been tolerating the dressing changes without complication. In general I'm extremely pleased with the overall appearance of his wounds. Fewell, Cas L. ([id number]) Electronic Signature(s) Signed: 12/30/2017 11:25:57 PM By: Worthy Keeler PA-C Entered By: Worthy Keeler on 12/30/2017 16:19:40 Vice, Kaspar L.  ([id number]) -------------------------------------------------------------------------------- Physical Exam Details Patient Name: [name], [name] Date of Service: 12/30/2017 3:15 PM Medical Record Number: [id number] Patient Account Number: [id number] Date of Birth/Sex: 1925/09/30 (84 y.o. M) Treating RN: Roger Shelter Primary Care Provider: Ramonita Lab Other Clinician: Referring Provider: Ramonita Lab Treating Provider/Extender: Melburn Hake, Ezana Hubbert Weeks in Treatment: 47 Constitutional Well-nourished and well-hydrated in no acute distress. Respiratory normal breathing without difficulty. Psychiatric this patient is able to make decisions and demonstrates good insight into disease process. Alert and Oriented x 3. pleasant and cooperative. Notes Patient's wound did require some sharp debridement today which he tolerated without complication. Post debridement the wound beds appear to all be doing much better and in general I'm extremely pleased with the granulation and filling in of the wound beds. The right ulcer I feel will likely heal shortly. Electronic Signature(s) Signed: 12/30/2017 11:25:57 PM By: Worthy Keeler PA-C Entered By: Worthy Keeler on 12/30/2017 16:20:53 [name], [name] ([id number]) -------------------------------------------------------------------------------- Physician Orders Details Patient Name: [name], [name] Date of Service: 12/30/2017 3:15 PM Medical Record Number: [id number] Patient Account Number: [id number] Date of Birth/Sex: 01/13/1926 (84 y.o. M) Treating RN: Roger Shelter Primary Care Provider: Ramonita Lab Other Clinician: Referring Provider: Ramonita Lab Treating Provider/Extender: Melburn Hake, Catrina Fellenz Weeks in Treatment: 26 Verbal / Phone Orders: No Diagnosis Coding Wound Cleansing Wound #1 Left Calcaneus o Clean wound with Normal Saline. o Cleanse wound with mild soap and water o May Shower, gently pat wound dry prior to applying new  dressing. Wound #2 Right Calcaneus o Clean wound with Normal Saline. o Cleanse wound with mild soap and water o May Shower, gently pat wound dry prior to applying new dressing. Anesthetic (add to Medication List) Wound #1 Left Calcaneus o Topical Lidocaine 4% cream applied to wound bed prior to debridement (In Clinic Only). Wound #2 Right Calcaneus o Topical Lidocaine 4% cream applied to wound bed prior to debridement (In Clinic Only). Skin Barriers/Peri-Wound Care Wound #1 Left Calcaneus o Skin Prep  Wound #2 Right Calcaneus o Skin Prep Primary Wound Dressing Wound #1 Left Calcaneus o Silver Collagen Wound #2 Right Calcaneus o Silver Collagen Secondary Dressing Wound #1 Left Calcaneus o Dry Gauze o Boardered Foam Dressing Wound #2 Right Calcaneus o Dry Gauze o Boardered Foam Dressing Dressing Change Frequency Wound #1 Left Calcaneus [name], [name] ([id number]) o Change dressing every other day. Wound #2 Right Calcaneus o Change dressing every other day. Follow-up Appointments Wound #1 Left Calcaneus o Return Appointment in 1 week. Wound #2 Right Calcaneus o Return Appointment in 1 week. Off-Loading Wound #1 Left Calcaneus o Turn and reposition every 2 hours Wound #2 Right Calcaneus o Turn and reposition every 2 hours Additional Orders / Instructions Wound #1 Left Calcaneus o Increase protein intake. Wound #2 Right Calcaneus o Increase protein intake. Electronic Signature(s) Signed: 12/30/2017 4:53:36 PM By: Roger Shelter Signed: 12/30/2017 11:25:57 PM By: Worthy Keeler PA-C Entered By: Roger Shelter on 12/30/2017 15:52:02 [name], [name] ([id number]) -------------------------------------------------------------------------------- Problem List Details Patient Name: [name], [name] Date of Service: 12/30/2017 3:15 PM Medical Record Number: [id number] Patient Account Number: [id number] Date of Birth/Sex:  [birth year] (84 y.o. M) Treating RN: Roger Shelter Primary Care Provider: Ramonita Lab Other Clinician: Referring Provider: Ramonita Lab Treating Provider/Extender: Melburn Hake, Oluwateniola Leitch Weeks in Treatment: 77 Active Problems ICD-10 Evaluated Encounter Code Description Active Date Today Diagnosis L89.623 Pressure ulcer of left heel, stage 3 08/19/2017 No Yes L89.613 Pressure ulcer of right heel, stage 3 08/19/2017 No Yes I48.2 Chronic atrial fibrillation 08/19/2017 No Yes I10 Essential (primary) hypertension 08/19/2017 No Yes Z79.01 Long term (current) use of anticoagulants 08/19/2017 No Yes Inactive Problems Resolved Problems Electronic Signature(s) Signed: 12/30/2017 11:25:57 PM By: Worthy Keeler PA-C Entered By: Worthy Keeler on 12/30/2017 15:30:35 [name], [name] ([id number]) -------------------------------------------------------------------------------- Progress Note Details Patient Name: [name], [name] Date of Service: 12/30/2017 3:15 PM Medical Record Number: [id number] Patient Account Number: [id number] Date of Birth/Sex: Oct 19, 1925 (84 y.o. M) Treating RN: Roger Shelter Primary Care Provider: Ramonita Lab Other Clinician: Referring Provider: Ramonita Lab Treating Provider/Extender: Melburn Hake, Timmey Lamba Weeks in Treatment: 19 Subjective Chief Complaint Information obtained from Patient Bilateral Heel pressure ulcers and bilateral great toe DTIs History of Present Illness (HPI) The following HPI elements were documented for the patient's wound: Associated Signs and Symptoms: Patient has a history of chronic atrophic relation, hypertension, and long-term use of anticoagulants. He also had a fall with pelvic fracture which occurred on January 2019. 08/19/17 patient presents today for initial evaluation and our clinic concerning issues he has been having with the bilateral heels as well as the lateral great toe was since he was admitted to the nursing facility following a pelvic  fracture. Initially he was spending a significant amount of time in the bed although he is now up more often since the healing process has progressed along the way obviously. When he is up he's spending most of the time sitting in a wheelchair at this point. When he is in bed he lays on his back and the covers/sheets are pushing down on his toes bilaterally which I think it may be what's causing the issues with his deep tissue injury to the bilateral great toes. With that being said the hills also appear to be pressure in nature and I think that laying in the bed getting pressure to the sites is what has led to the formation of these on stage will pressure ulcers. Fortunately there does not appear to be any infection  in both areas of ulceration of the heel appear to be stable at this point. Patient does not have any significant pain at the site she does have some of the eschar along the edges which is starting to lift up and come all hopefully slowly but surely this will occur. Patient did have arterial studies performed April 2018 which showed a normal TBI on the right knee slightly depressed TBI on the left but this does not appear to have changed significantly at that point. Specifically this was 0.75 on the right and 0.61 on the left. 09/13/17 on evaluation today patient presents for a follow-up evaluation regarding his bilateral heal ulcers. He has been tolerating the dressing changes fairly well although he did have some bleeding earlier today when it appears some of the skin on the surrounding portion of the wound got pulled where this is starting to flake off. Subsequently there does not appear to be any bleeding right now. Since I last saw him he has been discharged from the facility and is now back at home. His wife is having a very difficult time being able to get him up and in the car in order to come for an appointment which is why they missed the last appointment have not been able to  come back sooner. Fortunately the he does not have any signs of infection. No fevers chills noted. He does have difficulty walking due to pain in the bilateral heels. 10/11/17 on evaluation today patient presents for follow-up evaluation concerning his ongoing bilateral heal ulcers. He has been tolerating the dressing changes which were Betadine paints daily without complication. I have not seen him since September 13, 2017. Pressure that I saw him August 19, 2017. Subsequently those are the only prior visits before today. His left heel actually seems to have a significant amount of eschar that is starting to lift up at this point and seems to be a little more loose as far as drainage building up underneath the eschar. The right eschar though there are some parts lifting up seems to still be fairly stable which is good news. His toe ulcer is almost completely healed a lot of that eschar came off and it looks very well underneath. 12/16/17 on evaluation today patient appears to be doing extremely well in regard to his bilateral heal ulcers. His right heel shows good signs of improvement with good epithelialization. Overall the Prisma seems to be of benefit for him. In regard to the left heel he is showing more granulation tissue than previously noted obviously this is what we want to see. This also appears to be more firm and solid than previously noted. Nonetheless I still think that we should utilize the Iodoflex for a time more at this point. Overall however I'm extremely pleased with his progress. Maeda, Jacarius L. ([id number]) 12/23/17 on evaluation today patient actually appears to be doing better in regard to his bilateral heal ulcers. He has been tolerating the dressing changes without complication and in fact tells me that he is having very little discomfort and pain which is great news. Overall I'm very happy with the progress he's been making in recent weeks. 12/30/17 on evaluation today patient  appears to show signs of improvement in regard to his ulcers of the bilateral lower extremities especially the right. Really both are already doing much better. The patient has been tolerating the dressing changes without complication. In general I'm extremely pleased with the overall appearance of his wounds. Patient History  Information obtained from Patient. Family History No family history of Cancer, Diabetes, Heart Disease, Hereditary Spherocytosis, Hypertension, Kidney Disease, Lung Disease, Seizures, Stroke, Thyroid Problems, Tuberculosis. Social History Former smoker, Marital Status - Married, Alcohol Use - Rarely - 2oz day, Drug Use - No History, Caffeine Use - Daily. Review of Systems (ROS) Constitutional Symptoms (General Health) Denies complaints or symptoms of Fever, Chills. Respiratory The patient has no complaints or symptoms. Cardiovascular The patient has no complaints or symptoms. Psychiatric The patient has no complaints or symptoms. Objective Constitutional Well-nourished and well-hydrated in no acute distress. Vitals Time Taken: 3:31 PM, Height: 70 in, Weight: 190 lbs, BMI: 27.3, Temperature: 97.6 F, Pulse: 71 bpm, Respiratory Rate: 16 breaths/min, Blood Pressure: 120/55 mmHg. Respiratory normal breathing without difficulty. Psychiatric this patient is able to make decisions and demonstrates good insight into disease process. Alert and Oriented x 3. pleasant and cooperative. General Notes: Patient's wound did require some sharp debridement today which he tolerated without complication. Post debridement the wound beds appear to all be doing much better and in general I'm extremely pleased with the granulation and filling in of the wound beds. The right ulcer I feel will likely heal shortly. Integumentary (Hair, Skin) [name], [name] ([id number]) Wound #1 status is Open. Original cause of wound was Pressure Injury. The wound is located on the Left Calcaneus.  The wound measures 2.1cm length x 2.4cm width x 0.2cm depth; 3.958cm^2 area and 0.792cm^3 volume. There is Fat Layer (Subcutaneous Tissue) Exposed exposed. There is no tunneling or undermining noted. There is a medium amount of serosanguineous drainage noted. The wound margin is distinct with the outline attached to the wound base. There is medium (34-66%) pink granulation within the wound bed. There is a medium (34-66%) amount of necrotic tissue within the wound bed including Adherent Slough. The periwound skin appearance had no abnormalities noted for color. The periwound skin appearance exhibited: Excoriation. The periwound skin appearance did not exhibit: Callus, Crepitus, Induration, Rash, Scarring. Wound #2 status is Open. Original cause of wound was Pressure Injury. The wound is located on the Right Calcaneus. The wound measures 1.1cm length x 0.8cm width x 0.1cm depth; 0.691cm^2 area and 0.069cm^3 volume. There is Fat Layer (Subcutaneous Tissue) Exposed exposed. There is no tunneling or undermining noted. There is a small amount of drainage noted. The wound margin is distinct with the outline attached to the wound base. There is large (67-100%) red, pink granulation within the wound bed. There is a small (1-33%) amount of necrotic tissue within the wound bed including Adherent Slough. The periwound skin appearance did not exhibit: Callus, Crepitus, Excoriation, Induration, Rash, Scarring, Dry/Scaly, Maceration, Atrophie Blanche, Cyanosis, Ecchymosis, Hemosiderin Staining, Mottled, Pallor, Rubor, Erythema. Periwound temperature was noted as No Abnormality. Assessment Active Problems ICD-10 Pressure ulcer of left heel, stage 3 Pressure ulcer of right heel, stage 3 Chronic atrial fibrillation Essential (primary) hypertension Long term (current) use of anticoagulants Procedures Wound #1 Pre-procedure diagnosis of Wound #1 is a Pressure Ulcer located on the Left Calcaneus . There was a  Excisional Skin/Subcutaneous Tissue Debridement with a total area of 5.04 sq cm performed by STONE III, Tammie Ellsworth E., PA-C. With the following instrument(s): Curette to remove Viable and Non-Viable tissue/material. Material removed includes Subcutaneous Tissue, Slough, and Biofilm after achieving pain control using Other (lidocaine 4%). No specimens were taken. A time out was conducted at 15:46, prior to the start of the procedure. A Minimum amount of bleeding was controlled with Pressure. The procedure was tolerated  well with a pain level of 0 throughout and a pain level of 0 following the procedure. Patient s Level of Consciousness post procedure was recorded as Awake and Alert. Post Debridement Measurements: 2.1cm length x 2.4cm width x 0.3cm depth; 1.188cm^3 volume. Post debridement Stage noted as Category/Stage III. Character of Wound/Ulcer Post Debridement is stable. Post procedure Diagnosis Wound #1: Same as Pre-Procedure Wound #2 Pre-procedure diagnosis of Wound #2 is a Pressure Ulcer located on the Right Calcaneus . There was a Excisional Skin/Subcutaneous Tissue Debridement with a total area of 0.88 sq cm performed by STONE III, Denali Sharma E., PA-C. With the following instrument(s): Curette to remove Viable and Non-Viable tissue/material. Material removed includes Subcutaneous Tissue, Slough, and Biofilm after achieving pain control using Other (lidocaine 4%). No specimens were taken. A time out was conducted at 15:46, prior to the start of the procedure. A Minimum amount of bleeding was controlled with Pressure. The [place]izer, [place] ([id number]) procedure was tolerated well with a pain level of 0 throughout and a pain level of 0 following the procedure. Patient s Level of Consciousness post procedure was recorded as Awake and Alert. Post Debridement Measurements: 1.1cm length x 0.8cm width x 0.2cm depth; 0.138cm^3 volume. Post debridement Stage noted as Category/Stage III. Character of  Wound/Ulcer Post Debridement is stable. Post procedure Diagnosis Wound #2: Same as Pre-Procedure Plan Wound Cleansing: Wound #1 Left Calcaneus: Clean wound with Normal Saline. Cleanse wound with mild soap and water May Shower, gently pat wound dry prior to applying new dressing. Wound #2 Right Calcaneus: Clean wound with Normal Saline. Cleanse wound with mild soap and water May Shower, gently pat wound dry prior to applying new dressing. Anesthetic (add to Medication List): Wound #1 Left Calcaneus: Topical Lidocaine 4% cream applied to wound bed prior to debridement (In Clinic Only). Wound #2 Right Calcaneus: Topical Lidocaine 4% cream applied to wound bed prior to debridement (In Clinic Only). Skin Barriers/Peri-Wound Care: Wound #1 Left Calcaneus: Skin Prep Wound #2 Right Calcaneus: Skin Prep Primary Wound Dressing: Wound #1 Left Calcaneus: Silver Collagen Wound #2 Right Calcaneus: Silver Collagen Secondary Dressing: Wound #1 Left Calcaneus: Dry Gauze Boardered Foam Dressing Wound #2 Right Calcaneus: Dry Gauze Boardered Foam Dressing Dressing Change Frequency: Wound #1 Left Calcaneus: Change dressing every other day. Wound #2 Right Calcaneus: Change dressing every other day. Follow-up Appointments: Wound #1 Left Calcaneus: Return Appointment in 1 week. Wound #2 Right Calcaneus: Return Appointment in 1 week. Off-Loading: Wound #1 Left Calcaneus: Turn and reposition every 2 hours Wound #2 Right Calcaneus: Turn and reposition every 2 hours Sigmon, Nayden L. ([id number]) Additional Orders / Instructions: Wound #1 Left Calcaneus: Increase protein intake. Wound #2 Right Calcaneus: Increase protein intake. We will continue with the Current wound care measures for the next week. Patient is in agreement that plan. Subsequently I will see were things stand at follow-up in one weeks time. Please see above for specific wound care orders. We will see patient for  re-evaluation in 1 week(s) here in the clinic. If anything worsens or changes patient will contact our office for additional recommendations. Electronic Signature(s) Signed: 12/30/2017 11:25:57 PM By: Worthy Keeler PA-C Entered By: Worthy Keeler on 12/30/2017 16:22:02 Blow, Kiefer L. ([id number]) -------------------------------------------------------------------------------- ROS/PFSH Details Patient Name: HANTON, Vaughn L. Date of Service: 12/30/2017 3:15 PM Medical Record Number: [id number] Patient Account Number: [id number] Date of Birth/Sex: December 22, 1925 (84 y.o. M) Treating RN: Roger Shelter Primary Care Provider: Ramonita Lab Other Clinician: Referring Provider: Ramonita Lab Treating Provider/Extender:  STONE III, Haris Baack Weeks in Treatment: 19 Information Obtained From Patient Wound History Do you currently have one or more open woundso Yes How many open wounds do you currently haveo 2 Approximately how long have you had your woundso 1 week How have you been treating your wound(s) until nowo nothing Has your wound(s) ever healed and then re-openedo No Have you had any lab work done in the past montho No Have you tested positive for osteomyelitis (bone infection)o No Have you had any tests for circulation on your legso No Have you had other problems associated with your woundso Swelling Constitutional Symptoms (General Health) Complaints and Symptoms: Negative for: Fever; Chills Respiratory Complaints and Symptoms: No Complaints or Symptoms Cardiovascular Complaints and Symptoms: No Complaints or Symptoms Medical History: Positive for: Arrhythmia - a-fib; Hypertension Oncologic Medical History: Positive for: Received Radiation Psychiatric Complaints and Symptoms: No Complaints or Symptoms Immunizations Pneumococcal Vaccine: Received Pneumococcal Vaccination: Yes Implantable Devices Family and Social History Fountain, Shreyan L. ([id number]) Cancer: No; Diabetes: No;  Heart Disease: No; Hereditary Spherocytosis: No; Hypertension: No; Kidney Disease: No; Lung Disease: No; Seizures: No; Stroke: No; Thyroid Problems: No; Tuberculosis: No; Former smoker; Marital Status - Married; Alcohol Use: Rarely - 2oz day; Drug Use: No History; Caffeine Use: Daily; Financial Concerns: No; Food, Clothing or Shelter Needs: No; Support System Lacking: No; Transportation Concerns: No; Advanced Directives: Yes (Not Provided); Patient does not want information on Advanced Directives; Do not resuscitate: No; Living Will: Yes (Not Provided); Medical Power of Attorney: No Physician Affirmation I have reviewed and agree with the above information. Electronic Signature(s) Signed: 12/30/2017 4:53:36 PM By: Roger Shelter Signed: 12/30/2017 11:25:57 PM By: Worthy Keeler PA-C Entered By: Worthy Keeler on 12/30/2017 16:20:07 Edsall, Howe ([id number]) -------------------------------------------------------------------------------- SuperBill Details Patient Name: MONK, Maclean L. Date of Service: 12/30/2017 Medical Record Number: [id number] Patient Account Number: [id number] Date of Birth/Sex: Feb 16, 1926 (84 y.o. M) Treating RN: Roger Shelter Primary Care Provider: Ramonita Lab Other Clinician: Referring Provider: Ramonita Lab Treating Provider/Extender: Melburn Hake, Yarelin Reichardt Weeks in Treatment: 19 Diagnosis Coding ICD-10 Codes Code Description [phone] Pressure ulcer of left heel, stage 3 L89.613 Pressure ulcer of right heel, stage 3 I48.2 Chronic atrial fibrillation I10 Essential (primary) hypertension Z79.01 Long term (current) use of anticoagulants Facility Procedures CPT4 Code: [id number] Description: 03888 - DEB SUBQ TISSUE 20 SQ CM/< ICD-10 Diagnosis Description L89.623 Pressure ulcer of left heel, stage 3 L89.613 Pressure ulcer of right heel, stage 3 Modifier: Quantity: 1 Physician Procedures CPT4 Code: [id number] Description: 17915 - WC PHYS SUBQ TISS 20 SQ CM ICD-10  Diagnosis Description L89.623 Pressure ulcer of left heel, stage 3 L89.613 Pressure ulcer of right heel, stage 3 Modifier: Quantity: 1 Electronic Signature(s) Signed: 12/30/2017 11:50:23 PM By: Worthy Keeler PA-C Entered By: Worthy Keeler on 12/30/2017 23:49:25

## 2018-01-06 ENCOUNTER — Encounter: Payer: PPO | Attending: Physician Assistant | Admitting: Physician Assistant

## 2018-01-06 DIAGNOSIS — I10 Essential (primary) hypertension: Secondary | ICD-10-CM | POA: Insufficient documentation

## 2018-01-06 DIAGNOSIS — I482 Chronic atrial fibrillation: Secondary | ICD-10-CM | POA: Diagnosis not present

## 2018-01-06 DIAGNOSIS — L89613 Pressure ulcer of right heel, stage 3: Secondary | ICD-10-CM | POA: Insufficient documentation

## 2018-01-06 DIAGNOSIS — Z7901 Long term (current) use of anticoagulants: Secondary | ICD-10-CM | POA: Diagnosis not present

## 2018-01-06 DIAGNOSIS — L89623 Pressure ulcer of left heel, stage 3: Secondary | ICD-10-CM | POA: Diagnosis not present

## 2018-01-06 DIAGNOSIS — Z87891 Personal history of nicotine dependence: Secondary | ICD-10-CM | POA: Diagnosis not present

## 2018-01-09 NOTE — Progress Notes (Signed)
Ask, [name] ([id number]) Visit Report for 01/06/2018 Arrival Information Details Patient Name: [name], [name] Date of Service: 01/06/2018 11:30 AM Medical Record Number: [id number] Patient Account Number: [id number] Date of Birth/Sex: 05/30/1926 (84 y.o. M) Treating RN: Ahmed Prima Primary Care [name]: [name] Other Clinician: Referring [name]: [name] Treating [name]: [name] Weeks in Treatment: 20 Visit Information History Since Last Visit All ordered tests and consults were completed: No Patient Arrived: [name] Added or deleted any medications: No Arrival Time: 11:25 Any new allergies or adverse reactions: No Accompanied By: spouse Had a fall or experienced change in No Transfer Assistance: EasyPivot Patient activities of daily living that may affect Lift risk of falls: Patient Identification Verified: Yes Signs or symptoms of abuse/neglect since last visito No Secondary Verification Process Yes Hospitalized since last visit: No Completed: Implantable device outside of the clinic excluding No Patient Requires Transmission-Based No cellular tissue based products placed in the center Precautions: since last visit: Patient Has Alerts: Yes Has Dressing in Place as Prescribed: Yes Patient Alerts: Patient on Blood Pain Present Now: No Thinner Warfarin Electronic Signature(s) Signed: 01/06/2018 5:11:46 PM By: Alric Quan Entered By: Alric Quan on 01/06/2018 11:26:45 [name], [name] ([id number]) -------------------------------------------------------------------------------- Encounter Discharge Information Details Patient Name: [name], [name] Date of Service: 01/06/2018 11:30 AM Medical Record Number: [id number] Patient Account Number: [id number] Date of Birth/Sex: March 14, 1926 (84 y.o. M) Treating RN: Roger Shelter Primary Care Meridee Branum: [name] Other Clinician: Referring [name]: [name] Treating  [name]: [name] Weeks in Treatment: 20 Encounter Discharge Information Items Discharge Condition: Stable Ambulatory Status: Walker Discharge Destination: Home Transportation: Private Auto Accompanied By: wife Schedule Follow-up Appointment: Yes Clinical Summary of Care: Electronic Signature(s) Signed: 01/08/2018 3:34:51 PM By: Roger Shelter Entered By: Roger Shelter on 01/06/2018 12:26:30 [name], [name] ([id number]) -------------------------------------------------------------------------------- Lower Extremity Assessment Details Patient Name: [name], [name] Date of Service: 01/06/2018 11:30 AM Medical Record Number: [id number] Patient Account Number: [id number] Date of Birth/Sex: [birth year] (84 y.o. M) Treating RN: Ahmed Prima Primary Care Gwendy Boeder: [name] Other Clinician: Referring Melton Walls: [name] Treating [name]: [name] Weeks in Treatment: 20 Vascular Assessment Pulses: Dorsalis Pedis Palpable: [Left:Yes] [Right:Yes] Posterior Tibial Extremity colors, hair growth, and conditions: Extremity Color: [Left:Normal] [Right:Normal] Temperature of Extremity: [Left:Warm] [Right:Warm] Capillary Refill: [Left:< 3 seconds] [Right:< 3 seconds] Toe Nail Assessment Left: Right: Thick: Yes Yes Discolored: Yes Yes Deformed: No No Improper Length and Hygiene: No No Electronic Signature(s) Signed: 01/06/2018 5:11:46 PM By: Alric Quan Entered By: Alric Quan on 01/06/2018 11:40:54 [name], [name] ([id number]) -------------------------------------------------------------------------------- Multi Wound Chart Details Patient Name: [name], [name] Date of Service: 01/06/2018 11:30 AM Medical Record Number: [id number] Patient Account Number: [id number] Date of Birth/Sex: 10/12/1925 (84 y.o. M) Treating RN: Roger Shelter Primary Care Kamarah Bilotta: [name] Other Clinician: Referring Jemari Hallum: [name] Treating  [name]: [name] Weeks in Treatment: 20 Vital Signs Height(in): 70 Pulse(bpm): 46 Weight(lbs): 190 Blood Pressure(mmHg): 119/52 Body Mass Index(BMI): 27 Temperature(F): 97.6 Respiratory Rate 16 (breaths/min): Photos: [1:No Photos] [2:No Photos] [N/A:N/A] Wound Location: [1:Left Calcaneus] [2:Right Calcaneus] [N/A:N/A] Wounding Event: [1:Pressure Injury] [2:Pressure Injury] [N/A:N/A] Primary Etiology: [1:Pressure Ulcer] [2:Pressure Ulcer] [N/A:N/A] Comorbid History: [1:Arrhythmia, Hypertension, Received Radiation] [2:Arrhythmia, Hypertension, Received Radiation] [N/A:N/A] Date Acquired: [1:08/12/2017] [2:08/12/2017] [N/A:N/A] Weeks of Treatment: [1:20] [2:20] [N/A:N/A] Wound Status: [1:Open] [2:Open] [N/A:N/A] Measurements L x W x D [1:2.6x2.7x0.8] [2:0.8x0.5x0.2] [N/A:N/A] (cm) Area (cm) : [1:5.513] [6:7.591] [N/A:N/A] Volume (cm) : [1:4.411] [2:0.063] [N/A:N/A] % Reduction in Area: [1:75.90%] [2:97.30%] [N/A:N/A] %  Reduction in Volume: [1:3.60%] [2:97.30%] [N/A:N/A] Position 1 (o'clock): [1:5] Maximum Distance 1 (cm): [1:0.7] Tunneling: [1:Yes] [2:No] [N/A:N/A] Classification: [1:Category/Stage III] [2:Category/Stage III] [N/A:N/A] Exudate Amount: [1:Large] [2:Medium] [N/A:N/A] Exudate Type: [1:Purulent] [2:Serosanguineous] [N/A:N/A] Exudate Color: [1:yellow, brown, green] [2:red, brown] [N/A:N/A] Foul Odor After Cleansing: [1:Yes] [2:No] [N/A:N/A] Odor Anticipated Due to [1:No] [2:N/A] [N/A:N/A] Product Use: Wound Margin: [1:Distinct, outline attached] [2:Distinct, outline attached] [N/A:N/A] Granulation Amount: [1:Medium (34-66%)] [2:Medium (34-66%)] [N/A:N/A] Granulation Quality: [1:Pink] [2:Red, Pink] [N/A:N/A] Necrotic Amount: [1:Medium (34-66%)] [2:Medium (34-66%)] [N/A:N/A] Exposed Structures: [1:Fat Layer (Subcutaneous Tissue) Exposed: Yes Fascia: No Tendon: No Muscle: No Joint: No Bone: No] [2:Fat Layer (Subcutaneous Tissue) Exposed: Yes Fascia:  No Tendon: No Muscle: No Joint: No Bone: No] [N/A:N/A] Epithelialization: [1:None] [2:None] [N/A:N/A] Periwound Skin Texture: Excoriation: Yes Excoriation: No N/A Induration: No Induration: No Callus: No Callus: No Crepitus: No Crepitus: No Rash: No Rash: No Scarring: No Scarring: No Periwound Skin Moisture: No Abnormalities Noted Maceration: No N/A Dry/Scaly: No Periwound Skin Color: No Abnormalities Noted Atrophie Blanche: No N/A Cyanosis: No Ecchymosis: No Erythema: No Hemosiderin Staining: No Mottled: No Pallor: No Rubor: No Temperature: No Abnormality No Abnormality N/A Tenderness on Palpation: Yes Yes N/A Wound Preparation: Ulcer Cleansing: Ulcer Cleansing: N/A Rinsed/Irrigated with Saline Rinsed/Irrigated with Saline Topical Anesthetic Applied: Topical Anesthetic Applied: Other: lidocaine 4% Other: lidocaine 4% Treatment Notes Electronic Signature(s) Signed: 01/08/2018 3:34:51 PM By: Roger Shelter Entered By: Roger Shelter on 01/06/2018 12:17:04 [name], [name] ([id number]) -------------------------------------------------------------------------------- [place] Details Patient Name: CARMICHAEL, BURDETTE. Date of Service: 01/06/2018 11:30 AM Medical Record Number: [id number] Patient Account Number: [id number] Date of Birth/Sex: 26-Apr-1926 (84 y.o. M) Treating RN: Roger Shelter Primary Care Eunie Lawn: [name] Other Clinician: Referring Abelardo Seidner: [name] Treating Conal Shetley/Extender: [name] Weeks in Treatment: 20 Active Inactive ` Abuse / Safety / Falls / Self Care Management Nursing Diagnoses: History of Falls Potential for falls Goals: Patient will not experience any injury related to falls Date Initiated: 08/19/2017 Target Resolution Date: 12/14/2017 Goal Status: Active Interventions: Assess Activities of Daily Living upon admission and as needed Assess fall risk on admission and as needed Assess: immobility,  friction, shearing, incontinence upon admission and as needed Assess impairment of mobility on admission and as needed per policy Assess personal safety and home safety (as indicated) on admission and as needed Assess self care needs on admission and as needed Notes: ` Nutrition Nursing Diagnoses: Imbalanced nutrition Potential for alteratiion in Nutrition/Potential for imbalanced nutrition Goals: Patient/caregiver agrees to and verbalizes understanding of need to use nutritional supplements and/or vitamins as prescribed Date Initiated: 08/19/2017 Target Resolution Date: 11/16/2017 Goal Status: Active Interventions: Assess patient nutrition upon admission and as needed per policy Notes: ` Orientation to the Wound Care Program Nursing Diagnoses: Jenkinson, Shunsuke L. ([id number]) Knowledge deficit related to the wound healing center program Goals: Patient/caregiver will verbalize understanding of the [facility] Date Initiated: 08/19/2017 Target Resolution Date: 09/14/2017 Goal Status: Active Interventions: Provide education on orientation to the wound center Notes: ` Pain, Acute or Chronic Nursing Diagnoses: Pain, acute or chronic: actual or potential Potential alteration in comfort, pain Goals: Patient/caregiver will verbalize adequate pain control between visits Date Initiated: 08/19/2017 Target Resolution Date: 12/14/2017 Goal Status: Active Interventions: Complete pain assessment as per visit requirements Notes: ` Pressure Nursing Diagnoses: Knowledge deficit related to causes and risk factors for pressure ulcer development Knowledge deficit related to management of pressures ulcers Potential for impaired tissue integrity related to pressure, friction, moisture, and shear Goals:  Patient will remain free from development of additional pressure ulcers Date Initiated: 08/19/2017 Target Resolution Date: 12/14/2017 Goal Status: Active Interventions: Assess:  immobility, friction, shearing, incontinence upon admission and as needed Assess potential for pressure ulcer upon admission and as needed Notes: ` Wound/Skin Impairment Nursing Diagnoses: Impaired tissue integrity Knowledge deficit related to ulceration/compromised skin integrity Esquilin, Byford L. ([id number]) Goals: Ulcer/skin breakdown will have a volume reduction of 80% by week 12 Date Initiated: 08/19/2017 Target Resolution Date: 12/14/2017 Goal Status: Active Interventions: Assess patient/caregiver ability to perform ulcer/skin care regimen upon admission and as needed Assess ulceration(s) every visit Notes: Electronic Signature(s) Signed: 01/08/2018 3:34:51 PM By: Roger Shelter Entered By: Roger Shelter on 01/06/2018 12:16:58 Cleckley, Jacorie L. ([id number]) -------------------------------------------------------------------------------- Pain Assessment Details Patient Name: PILSON, Kaydon L. Date of Service: 01/06/2018 11:30 AM Medical Record Number: [id number] Patient Account Number: [id number] Date of Birth/Sex: 1925/10/19 (84 y.o. M) Treating RN: Ahmed Prima Primary Care Emerald Gehres: [name] Other Clinician: Referring Tonatiuh Mallon: [name] Treating Alrick Cubbage/Extender: [name] Weeks in Treatment: 20 Active Problems Location of Pain Severity and Description of Pain Patient Has Paino No Site Locations Pain Management and Medication Current Pain Management: Electronic Signature(s) Signed: 01/06/2018 5:11:46 PM By: Alric Quan Entered By: Alric Quan on 01/06/2018 11:26:51 Wilcher, Ivy ([id number]) -------------------------------------------------------------------------------- Patient/Caregiver Education Details Patient Name: CONNOR, MEACHAM L. Date of Service: 01/06/2018 11:30 AM Medical Record Number: [id number] Patient Account Number: [id number] Date of Birth/Gender: [birth year] (84 y.o. M) Treating RN: Roger Shelter Primary Care Physician:  [name] Other Clinician: Referring Physician: Ramonita [name] Treating Physician/Extender: Sharalyn Ink in Treatment: 20 Education Assessment Education Provided To: Patient and Caregiver Education Topics Provided Wound Debridement: Handouts: Wound Debridement Methods: Explain/Verbal Responses: State content correctly Wound/Skin Impairment: Handouts: Caring for Your Ulcer Methods: Explain/Verbal Responses: State content correctly Electronic Signature(s) Signed: 01/08/2018 3:34:51 PM By: Roger Shelter Entered By: Roger Shelter on 01/06/2018 12:26:47 [name], Zohair L. ([id number]) -------------------------------------------------------------------------------- Wound Assessment Details Patient Name: [name], [name] Date of Service: 01/06/2018 11:30 AM Medical Record Number: [id number] Patient Account Number: [id number] Date of Birth/Sex: 1926-04-25 (84 y.o. M) Treating RN: Ahmed Prima Primary Care Katora Fini: [name] Other Clinician: Referring Navia Lindahl: [name] Treating Kaja Jackowski/Extender: [name] Weeks in Treatment: 20 Wound Status Wound Number: 1 Primary Etiology: Pressure Ulcer Wound Location: Left Calcaneus Wound Status: Open Wounding Event: Pressure Injury Comorbid Arrhythmia, Hypertension, Received History: Radiation Date Acquired: 08/12/2017 Weeks Of Treatment: 20 Clustered Wound: No Photos Photo Uploaded By: Gretta Cool, BSN, RN, CWS, Kim on 01/06/2018 14:51:18 Wound Measurements Length: (cm) 2.6 % Reducti Width: (cm) 2.7 % Reducti Depth: (cm) 0.8 Epithelia Area: (cm) 5.513 Tunnelin Volume: (cm) 4.411 Posit Maximu on in Area: 75.9% on in Volume: 3.6% lization: None g: Yes ion (o'clock): 5 m Distance: (cm) 0.7 Wound Description Classification: Category/Stage III Foul Odor Wound Margin: Distinct, outline attached Due to Pr Exudate Amount: Large Slough/Fi Exudate Type: Purulent Exudate Color: yellow, brown, green After Cleansing:  Yes oduct Use: No brino Yes Wound Bed Granulation Amount: Medium (34-66%) Exposed Structure Granulation Quality: Pink Fascia Exposed: No Necrotic Amount: Medium (34-66%) Fat Layer (Subcutaneous Tissue) Exposed: Yes Necrotic Quality: Adherent Slough Tendon Exposed: No Muscle Exposed: No Joint Exposed: No Bone Exposed: No [name], [name] ([id number]) Periwound Skin Texture Texture Color No Abnormalities Noted: No No Abnormalities Noted: Yes Callus: No Temperature / Pain Crepitus: No Temperature: No Abnormality Excoriation: Yes Tenderness on Palpation: Yes Induration: No Rash: No Scarring: No Moisture No Abnormalities Noted: No Wound Preparation Ulcer  Cleansing: Rinsed/Irrigated with Saline Topical Anesthetic Applied: Other: lidocaine 4%, Treatment Notes Wound #1 (Left Calcaneus) 1. Cleansed with: Clean wound with Normal Saline 2. Anesthetic Topical Lidocaine 4% cream to wound bed prior to debridement 4. Dressing Applied: Prisma Ag 5. Secondary Dressing Applied Bordered Foam Dressing Electronic Signature(s) Signed: 01/06/2018 5:11:46 PM By: Alric Quan Entered By: Alric Quan on 01/06/2018 11:38:29 [name], [name] ([id number]) -------------------------------------------------------------------------------- Wound Assessment Details Patient Name: [name], [name] Date of Service: 01/06/2018 11:30 AM Medical Record Number: [id number] Patient Account Number: [id number] Date of Birth/Sex: 1926/02/28 (84 y.o. M) Treating RN: Ahmed Prima Primary Care Johnie Makki: [name] Other Clinician: Referring Aissatou Fronczak: [name] Treating Grayton Lobo/Extender: [name] Weeks in Treatment: 20 Wound Status Wound Number: 2 Primary Etiology: Pressure Ulcer Wound Location: Right Calcaneus Wound Status: Open Wounding Event: Pressure Injury Comorbid Arrhythmia, Hypertension, Received History: Radiation Date Acquired: 08/12/2017 Weeks Of Treatment: 20 Clustered  Wound: No Photos Photo Uploaded By: Gretta Cool, BSN, RN, CWS, Kim on 01/06/2018 14:51:39 Wound Measurements Length: (cm) 0.8 Width: (cm) 0.5 Depth: (cm) 0.2 Area: (cm) 0.314 Volume: (cm) 0.063 % Reduction in Area: 97.3% % Reduction in Volume: 97.3% Epithelialization: None Tunneling: No Undermining: No Wound Description Classification: Category/Stage III Foul Odor Wound Margin: Distinct, outline attached Slough/Fi Exudate Amount: Medium Exudate Type: Serosanguineous Exudate Color: red, brown After Cleansing: No brino Yes Wound Bed Granulation Amount: Medium (34-66%) Exposed Structure Granulation Quality: Red, Pink Fascia Exposed: No Necrotic Amount: Medium (34-66%) Fat Layer (Subcutaneous Tissue) Exposed: Yes Necrotic Quality: Adherent Slough Tendon Exposed: No Muscle Exposed: No Joint Exposed: No Bone Exposed: No Periwound Skin Texture [name], [name] ([id number]) Texture Color No Abnormalities Noted: No No Abnormalities Noted: No Callus: No Atrophie Blanche: No Crepitus: No Cyanosis: No Excoriation: No Ecchymosis: No Induration: No Erythema: No Rash: No Hemosiderin Staining: No Scarring: No Mottled: No Pallor: No Moisture Rubor: No No Abnormalities Noted: No Dry / Scaly: No Temperature / Pain Maceration: No Temperature: No Abnormality Tenderness on Palpation: Yes Wound Preparation Ulcer Cleansing: Rinsed/Irrigated with Saline Topical Anesthetic Applied: Other: lidocaine 4%, Treatment Notes Wound #2 (Right Calcaneus) 1. Cleansed with: Clean wound with Normal Saline 2. Anesthetic Topical Lidocaine 4% cream to wound bed prior to debridement 4. Dressing Applied: Prisma Ag 5. Secondary Dressing Applied Bordered Foam Dressing Electronic Signature(s) Signed: 01/06/2018 5:11:46 PM By: Alric Quan Entered By: Alric Quan on 01/06/2018 11:34:50 Garbett, Shea L.  ([id number]) -------------------------------------------------------------------------------- Vitals Details Patient Name: [name], [name] Date of Service: 01/06/2018 11:30 AM Medical Record Number: [id number] Patient Account Number: [id number] Date of Birth/Sex: May 20, 1926 (84 y.o. M) Treating RN: Ahmed Prima Primary Care Azaya Goedde: [name] Other Clinician: Referring Chantee Cerino: [name] Treating Jesika Men/Extender: [name] Weeks in Treatment: 20 Vital Signs Time Taken: 11:26 Temperature (F): 97.6 Height (in): 70 Pulse (bpm): 68 Weight (lbs): 190 Respiratory Rate (breaths/min): 16 Body Mass Index (BMI): 27.3 Blood Pressure (mmHg): 119/52 Reference Range: 80 - 120 mg / dl Electronic Signature(s) Signed: 01/06/2018 5:11:46 PM By: Alric Quan Entered By: Alric Quan on 01/06/2018 11:28:57

## 2018-01-09 NOTE — Progress Notes (Signed)
[name], [name] ([id number]) Visit Report for 01/06/2018 Chief Complaint Document Details Patient Name: [name], [name] Date of Service: 01/06/2018 11:30 AM Medical Record Number: [id number] Patient Account Number: [id number] Date of Birth/Sex: 08/27/1925 (84 y.o. M) Treating RN: Roger Shelter Primary Care Provider: Ramonita Lab Other Clinician: Referring Provider: Ramonita Lab Treating Provider/Extender: Melburn Hake, Haddy Mullinax Weeks in Treatment: 20 Information Obtained from: Patient Chief Complaint Bilateral Heel pressure ulcers and bilateral great toe DTIs Electronic Signature(s) Signed: 01/07/2018 8:11:30 AM By: Worthy Keeler PA-C Entered By: Worthy Keeler on 01/06/2018 11:28:46 [name], [name] ([id number]) -------------------------------------------------------------------------------- Debridement Details Patient Name: [name], [name] Date of Service: 01/06/2018 11:30 AM Medical Record Number: [id number] Patient Account Number: [id number] Date of Birth/Sex: 07-27-1925 (84 y.o. M) Treating RN: Roger Shelter Primary Care Provider: Ramonita Lab Other Clinician: Referring Provider: Ramonita Lab Treating Provider/Extender: Melburn Hake, Shalom Ware Weeks in Treatment: 20 Debridement Performed for Wound #1 Left Calcaneus Assessment: Performed By: Physician STONE III, Kahle Mcqueen E., PA-C Debridement Type: Debridement Pre-procedure Verification/Time Yes - 12:20 Out Taken: Start Time: 12:20 Pain Control: Other : lidocaine 4% Total Area Debrided (L x W): 2.6 (cm) x 2.7 (cm) = 7.02 (cm) Tissue and other material Viable, Non-Viable, Callus, Slough, Subcutaneous, Biofilm, Slough debrided: Level: Skin/Subcutaneous Tissue Debridement Description: Excisional Instrument: Curette Bleeding: Minimum Hemostasis Achieved: Pressure End Time: 12:20 Procedural Pain: 0 Post Procedural Pain: 0 Response to Treatment: Procedure was tolerated well Level of Consciousness: Awake and Alert Post Procedure  Vitals: Temperature: 97.6 Pulse: 68 Respiratory Rate: 16 Blood Pressure: Systolic Blood Pressure: 081 Diastolic Blood Pressure: 52 Post Debridement Measurements of Total Wound Length: (cm) 2.6 Stage: Category/Stage III Width: (cm) 2.7 Depth: (cm) 0.8 Volume: (cm) 4.411 Character of Wound/Ulcer Post Stable Debridement: Post Procedure Diagnosis Same as Pre-procedure Electronic Signature(s) Signed: 01/07/2018 8:11:30 AM By: Worthy Keeler PA-C Signed: 01/08/2018 3:34:51 PM By: Roger Shelter Entered By: Roger Shelter on 01/06/2018 12:21:47 [name], [name] ([id number]) [name], [name] ([id number]) -------------------------------------------------------------------------------- HPI Details Patient Name: [name], [name] Date of Service: 01/06/2018 11:30 AM Medical Record Number: [id number] Patient Account Number: [id number] Date of Birth/Sex: 29-Dec-1925 (84 y.o. M) Treating RN: Roger Shelter Primary Care Provider: Ramonita Lab Other Clinician: Referring Provider: Ramonita Lab Treating Provider/Extender: Melburn Hake, Richanda Darin Weeks in Treatment: 20 History of Present Illness Associated Signs and Symptoms: Patient has a history of chronic atrophic relation, hypertension, and long-term use of anticoagulants. He also had a fall with pelvic fracture which occurred on January 2019. HPI Description: 08/19/17 patient presents today for initial evaluation and our clinic concerning issues he has been having with the bilateral heels as well as the lateral great toe was since he was admitted to the nursing facility following a pelvic fracture. Initially he was spending a significant amount of time in the bed although he is now up more often since the healing process has progressed along the way obviously. When he is up he's spending most of the time sitting in a wheelchair at this point. When he is in bed he lays on his back and the covers/sheets are pushing down on his toes bilaterally which I  think it may be what's causing the issues with his deep tissue injury to the bilateral great toes. With that being said the hills also appear to be pressure in nature and I think that laying in the bed getting pressure to the sites is what has led to the formation of these on stage will pressure ulcers. Fortunately there does not appear to  be any infection in both areas of ulceration of the heel appear to be stable at this point. Patient does not have any significant pain at the site she does have some of the eschar along the edges which is starting to lift up and come all hopefully slowly but surely this will occur. Patient did have arterial studies performed April 2018 which showed a normal TBI on the right knee slightly depressed TBI on the left but this does not appear to have changed significantly at that point. Specifically this was 0.75 on the right and 0.61 on the left. 09/13/17 on evaluation today patient presents for a follow-up evaluation regarding his bilateral heal ulcers. He has been tolerating the dressing changes fairly well although he did have some bleeding earlier today when it appears some of the skin on the surrounding portion of the wound got pulled where this is starting to flake off. Subsequently there does not appear to be any bleeding right now. Since I last saw him he has been discharged from the facility and is now back at home. His wife is having a very difficult time being able to get him up and in the car in order to come for an appointment which is why they missed the last appointment have not been able to come back sooner. Fortunately the he does not have any signs of infection. No fevers chills noted. He does have difficulty walking due to pain in the bilateral heels. 10/11/17 on evaluation today patient presents for follow-up evaluation concerning his ongoing bilateral heal ulcers. He has been tolerating the dressing changes which were Betadine paints daily without  complication. I have not seen him since September 13, 2017. Pressure that I saw him August 19, 2017. Subsequently those are the only prior visits before today. His left heel actually seems to have a significant amount of eschar that is starting to lift up at this point and seems to be a little more loose as far as drainage building up underneath the eschar. The right eschar though there are some parts lifting up seems to still be fairly stable which is good news. His toe ulcer is almost completely healed a lot of that eschar came off and it looks very well u 01/06/18 on evaluation today patient actually appears to be doing excellent in regard to his bilateral heal ulcers. The right heel is very close to completely closing and overall this is doing excellent. Left heel #2 great improvement as it has been for the past several weeks. I'm extremely pleased in this regard. Overall the patient is also happy to his wife seems to be doing an excellent job caring for him in my pinion. Electronic Signature(s) Signed: 01/07/2018 8:11:30 AM By: Worthy Keeler PA-C Entered By: Worthy Keeler on 01/06/2018 13:52:24 [name], [name] ([id number]) -------------------------------------------------------------------------------- Physical Exam Details Patient Name: [name], [name] Date of Service: 01/06/2018 11:30 AM Medical Record Number: [id number] Patient Account Number: [id number] Date of Birth/Sex: 07/21/1925 (84 y.o. M) Treating RN: Roger Shelter Primary Care Provider: Ramonita Lab Other Clinician: Referring Provider: Ramonita Lab Treating Provider/Extender: Melburn Hake, Ramiel Forti Weeks in Treatment: 25 Constitutional Well-nourished and well-hydrated in no acute distress. Respiratory normal breathing without difficulty. Psychiatric this patient is able to make decisions and demonstrates good insight into disease process. Alert and Oriented x 3. pleasant and cooperative. Notes Patient's wound bed does have some  Slough noted in regard to the left heel although the right heel to not have slough and did  not require sharp debridement today. Post debridement on the left the wound bed appears to be doing excellent with good granulation I'm very pleased in this regard. Electronic Signature(s) Signed: 01/07/2018 8:11:30 AM By: Worthy Keeler PA-C Entered By: Worthy Keeler on 01/06/2018 13:53:08 [name], [name] ([id number]) -------------------------------------------------------------------------------- Physician Orders Details Patient Name: [name], [name] Date of Service: 01/06/2018 11:30 AM Medical Record Number: [id number] Patient Account Number: [id number] Date of Birth/Sex: 1926-03-27 (84 y.o. M) Treating RN: Roger Shelter Primary Care Provider: Ramonita Lab Other Clinician: Referring Provider: Ramonita Lab Treating Provider/Extender: Melburn Hake, Marsha Hillman Weeks in Treatment: 20 Verbal / Phone Orders: No Diagnosis Coding ICD-10 Coding Code Description [phone] Pressure ulcer of left heel, stage 3 L89.613 Pressure ulcer of right heel, stage 3 I48.2 Chronic atrial fibrillation I10 Essential (primary) hypertension Z79.01 Long term (current) use of anticoagulants Wound Cleansing Wound #1 Left Calcaneus o Clean wound with Normal Saline. o Cleanse wound with mild soap and water o May Shower, gently pat wound dry prior to applying new dressing. Wound #2 Right Calcaneus o Clean wound with Normal Saline. o Cleanse wound with mild soap and water o May Shower, gently pat wound dry prior to applying new dressing. Anesthetic (add to Medication List) Wound #1 Left Calcaneus o Topical Lidocaine 4% cream applied to wound bed prior to debridement (In Clinic Only). Wound #2 Right Calcaneus o Topical Lidocaine 4% cream applied to wound bed prior to debridement (In Clinic Only). Skin Barriers/Peri-Wound Care Wound #1 Left Calcaneus o Skin Prep Wound #2 Right Calcaneus o Skin Prep Primary  Wound Dressing Wound #1 Left Calcaneus o Silver Collagen Wound #2 Right Calcaneus o Silver Collagen Secondary Dressing Wound #1 Left Calcaneus Dedmon, Syaire L. ([id number]) o Dry Gauze o Boardered Foam Dressing Wound #2 Right Calcaneus o Dry Gauze o Boardered Foam Dressing Dressing Change Frequency Wound #1 Left Calcaneus o Change dressing every other day. Wound #2 Right Calcaneus o Change dressing every other day. Follow-up Appointments Wound #1 Left Calcaneus o Return Appointment in 1 week. Wound #2 Right Calcaneus o Return Appointment in 1 week. Off-Loading Wound #1 Left Calcaneus o Turn and reposition every 2 hours Wound #2 Right Calcaneus o Turn and reposition every 2 hours Additional Orders / Instructions Wound #1 Left Calcaneus o Increase protein intake. Wound #2 Right Calcaneus o Increase protein intake. Electronic Signature(s) Signed: 01/07/2018 8:11:30 AM By: Worthy Keeler PA-C Signed: 01/08/2018 3:34:51 PM By: Roger Shelter Entered By: Roger Shelter on 01/06/2018 12:19:46 Feldt, Nara Visa ([id number]) -------------------------------------------------------------------------------- Problem List Details Patient Name: [name], [name] Date of Service: 01/06/2018 11:30 AM Medical Record Number: [id number] Patient Account Number: [id number] Date of Birth/Sex: 1926-02-18 (84 y.o. M) Treating RN: Roger Shelter Primary Care Provider: Ramonita Lab Other Clinician: Referring Provider: Ramonita Lab Treating Provider/Extender: Melburn Hake, Sung Parodi Weeks in Treatment: 20 Active Problems ICD-10 Evaluated Encounter Code Description Active Date Today Diagnosis L89.623 Pressure ulcer of left heel, stage 3 08/19/2017 No Yes L89.613 Pressure ulcer of right heel, stage 3 08/19/2017 No Yes I48.2 Chronic atrial fibrillation 08/19/2017 No Yes I10 Essential (primary) hypertension 08/19/2017 No Yes Z79.01 Long term (current) use of anticoagulants  08/19/2017 No Yes Inactive Problems Resolved Problems Electronic Signature(s) Signed: 01/07/2018 8:11:30 AM By: Worthy Keeler PA-C Entered By: Worthy Keeler on 01/06/2018 11:28:36 Clauson, Leevon L. ([id number]) -------------------------------------------------------------------------------- Progress Note Details Patient Name: [name], [name] Date of Service: 01/06/2018 11:30 AM Medical Record Number: [id number] Patient Account Number: [id number] Date of Birth/Sex: 09/30/1925 (84 y.o. M)  Treating RN: Roger Shelter Primary Care Provider: Ramonita Lab Other Clinician: Referring Provider: Ramonita Lab Treating Provider/Extender: Melburn Hake, Terez Freimark Weeks in Treatment: 20 Subjective Chief Complaint Information obtained from Patient Bilateral Heel pressure ulcers and bilateral great toe DTIs History of Present Illness (HPI) The following HPI elements were documented for the patient's wound: Associated Signs and Symptoms: Patient has a history of chronic atrophic relation, hypertension, and long-term use of anticoagulants. He also had a fall with pelvic fracture which occurred on January 2019. 08/19/17 patient presents today for initial evaluation and our clinic concerning issues he has been having with the bilateral heels as well as the lateral great toe was since he was admitted to the nursing facility following a pelvic fracture. Initially he was spending a significant amount of time in the bed although he is now up more often since the healing process has progressed along the way obviously. When he is up he's spending most of the time sitting in a wheelchair at this point. When he is in bed he lays on his back and the covers/sheets are pushing down on his toes bilaterally which I think it may be what's causing the issues with his deep tissue injury to the bilateral great toes. With that being said the hills also appear to be pressure in nature and I think that laying in the bed getting pressure  to the sites is what has led to the formation of these on stage will pressure ulcers. Fortunately there does not appear to be any infection in both areas of ulceration of the heel appear to be stable at this point. Patient does not have any significant pain at the site she does have some of the eschar along the edges which is starting to lift up and come all hopefully slowly but surely this will occur. Patient did have arterial studies performed April 2018 which showed a normal TBI on the right knee slightly depressed TBI on the left but this does not appear to have changed significantly at that point. Specifically this was 0.75 on the right and 0.61 on the left. 09/13/17 on evaluation today patient presents for a follow-up evaluation regarding his bilateral heal ulcers. He has been tolerating the dressing changes fairly well although he did have some bleeding earlier today when it appears some of the skin on the surrounding portion of the wound got pulled where this is starting to flake off. Subsequently there does not appear to be any bleeding right now. Since I last saw him he has been discharged from the facility and is now back at home. His wife is having a very difficult time being able to get him up and in the car in order to come for an appointment which is why they missed the last appointment have not been able to come back sooner. Fortunately the he does not have any signs of infection. No fevers chills noted. He does have difficulty walking due to pain in the bilateral heels. 10/11/17 on evaluation today patient presents for follow-up evaluation concerning his ongoing bilateral heal ulcers. He has been tolerating the dressing changes which were Betadine paints daily without complication. I have not seen him since September 13, 2017. Pressure that I saw him August 19, 2017. Subsequently those are the only prior visits before today. His left heel actually seems to have a significant amount of  eschar that is starting to lift up at this point and seems to be a little more loose as far as drainage  building up underneath the eschar. The right eschar though there are some parts lifting up seems to still be fairly stable which is good news. His toe ulcer is almost completely healed a lot of that eschar came off and it looks very well u 01/06/18 on evaluation today patient actually appears to be doing excellent in regard to his bilateral heal ulcers. The right heel is very close to completely closing and overall this is doing excellent. Left heel #2 great improvement as it has been for the past several weeks. I'm extremely pleased in this regard. Overall the patient is also happy to his wife seems to be doing an excellent job caring for him in my pinion. Patient History [name], [name] ([id number]) Information obtained from Patient. Family History No family history of Cancer, Diabetes, Heart Disease, Hereditary Spherocytosis, Hypertension, Kidney Disease, Lung Disease, Seizures, Stroke, Thyroid Problems, Tuberculosis. Social History Former smoker, Marital Status - Married, Alcohol Use - Rarely - 2oz day, Drug Use - No History, Caffeine Use - Daily. Review of Systems (ROS) Constitutional Symptoms (General Health) Denies complaints or symptoms of Fever, Chills. Respiratory The patient has no complaints or symptoms. Cardiovascular The patient has no complaints or symptoms. Psychiatric The patient has no complaints or symptoms. Objective Constitutional Well-nourished and well-hydrated in no acute distress. Vitals Time Taken: 11:26 AM, Height: 70 in, Weight: 190 lbs, BMI: 27.3, Temperature: 97.6 F, Pulse: 68 bpm, Respiratory Rate: 16 breaths/min, Blood Pressure: 119/52 mmHg. Respiratory normal breathing without difficulty. Psychiatric this patient is able to make decisions and demonstrates good insight into disease process. Alert and Oriented x 3. pleasant and cooperative. General  Notes: Patient's wound bed does have some Slough noted in regard to the left heel although the right heel to not have slough and did not require sharp debridement today. Post debridement on the left the wound bed appears to be doing excellent with good granulation I'm very pleased in this regard. Integumentary (Hair, Skin) Wound #1 status is Open. Original cause of wound was Pressure Injury. The wound is located on the Left Calcaneus. The wound measures 2.6cm length x 2.7cm width x 0.8cm depth; 5.513cm^2 area and 4.411cm^3 volume. There is Fat Layer (Subcutaneous Tissue) Exposed exposed. There is tunneling at 5:00 with a maximum distance of 0.7cm. There is a large amount of purulent drainage noted. Foul odor after cleansing was noted. The wound margin is distinct with the outline attached to the wound base. There is medium (34-66%) pink granulation within the wound bed. There is a medium (34-66%) amount of necrotic tissue within the wound bed including Adherent Slough. The periwound skin appearance had no abnormalities noted for color. The periwound skin appearance exhibited: Excoriation. The periwound skin appearance did not exhibit: Callus, Crepitus, Induration, Rash, Scarring. Periwound temperature was noted as No Abnormality. The periwound has tenderness on palpation. Boike, Kingdavid L. ([id number]) Wound #2 status is Open. Original cause of wound was Pressure Injury. The wound is located on the Right Calcaneus. The wound measures 0.8cm length x 0.5cm width x 0.2cm depth; 0.314cm^2 area and 0.063cm^3 volume. There is Fat Layer (Subcutaneous Tissue) Exposed exposed. There is no tunneling or undermining noted. There is a medium amount of serosanguineous drainage noted. The wound margin is distinct with the outline attached to the wound base. There is medium (34-66%) red, pink granulation within the wound bed. There is a medium (34-66%) amount of necrotic tissue within the wound bed including  Adherent Slough. The periwound skin appearance did not exhibit: Callus, Crepitus,  Excoriation, Induration, Rash, Scarring, Dry/Scaly, Maceration, Atrophie Blanche, Cyanosis, Ecchymosis, Hemosiderin Staining, Mottled, Pallor, Rubor, Erythema. Periwound temperature was noted as No Abnormality. The periwound has tenderness on palpation. Assessment Active Problems ICD-10 Pressure ulcer of left heel, stage 3 Pressure ulcer of right heel, stage 3 Chronic atrial fibrillation Essential (primary) hypertension Long term (current) use of anticoagulants Procedures Wound #1 Pre-procedure diagnosis of Wound #1 is a Pressure Ulcer located on the Left Calcaneus . There was a Excisional Skin/Subcutaneous Tissue Debridement with a total area of 7.02 sq cm performed by STONE III, Arjuna Doeden E., PA-C. With the following instrument(s): Curette to remove Viable and Non-Viable tissue/material. Material removed includes Callus, Subcutaneous Tissue, Slough, and Biofilm after achieving pain control using Other (lidocaine 4%). No specimens were taken. A time out was conducted at 12:20, prior to the start of the procedure. A Minimum amount of bleeding was controlled with Pressure. The procedure was tolerated well with a pain level of 0 throughout and a pain level of 0 following the procedure. Patient s Level of Consciousness post procedure was recorded as Awake and Alert. Post Debridement Measurements: 2.6cm length x 2.7cm width x 0.8cm depth; 4.411cm^3 volume. Post debridement Stage noted as Category/Stage III. Character of Wound/Ulcer Post Debridement is stable. Post procedure Diagnosis Wound #1: Same as Pre-Procedure Plan Wound Cleansing: Wound #1 Left Calcaneus: Clean wound with Normal Saline. Cleanse wound with mild soap and water May Shower, gently pat wound dry prior to applying new dressing. Wound #2 Right Calcaneus: Clean wound with Normal Saline. Cleanse wound with mild soap and water May Shower, gently pat  wound dry prior to applying new dressing. Cafarella, Stalin L. ([id number]) Anesthetic (add to Medication List): Wound #1 Left Calcaneus: Topical Lidocaine 4% cream applied to wound bed prior to debridement (In Clinic Only). Wound #2 Right Calcaneus: Topical Lidocaine 4% cream applied to wound bed prior to debridement (In Clinic Only). Skin Barriers/Peri-Wound Care: Wound #1 Left Calcaneus: Skin Prep Wound #2 Right Calcaneus: Skin Prep Primary Wound Dressing: Wound #1 Left Calcaneus: Silver Collagen Wound #2 Right Calcaneus: Silver Collagen Secondary Dressing: Wound #1 Left Calcaneus: Dry Gauze Boardered Foam Dressing Wound #2 Right Calcaneus: Dry Gauze Boardered Foam Dressing Dressing Change Frequency: Wound #1 Left Calcaneus: Change dressing every other day. Wound #2 Right Calcaneus: Change dressing every other day. Follow-up Appointments: Wound #1 Left Calcaneus: Return Appointment in 1 week. Wound #2 Right Calcaneus: Return Appointment in 1 week. Off-Loading: Wound #1 Left Calcaneus: Turn and reposition every 2 hours Wound #2 Right Calcaneus: Turn and reposition every 2 hours Additional Orders / Instructions: Wound #1 Left Calcaneus: Increase protein intake. Wound #2 Right Calcaneus: Increase protein intake. At this point I'm gonna suggest we go ahead and proceed with the above wound care orders for the next week. Patient is in agreement the plan. We will subsequently see were things stand at follow-up. Please see above for specific wound care orders. We will see patient for re-evaluation in 1 week(s) here in the clinic. If anything worsens or changes patient will contact our office for additional recommendations. Electronic Signature(s) Signed: 01/07/2018 8:11:30 AM By: Worthy Keeler PA-C Entered By: Worthy Keeler on 01/06/2018 13:53:40 Ignasiak, Destyn L. ([id number]) -------------------------------------------------------------------------------- ROS/PFSH  Details Patient Name: BECKFORD, Keiondre L. Date of Service: 01/06/2018 11:30 AM Medical Record Number: [id number] Patient Account Number: [id number] Date of Birth/Sex: Aug 27, 1925 (84 y.o. M) Treating RN: Roger Shelter Primary Care Provider: Ramonita Lab Other Clinician: Referring Provider: Ramonita Lab Treating Provider/Extender: Melburn Hake, Karyssa Amaral Weeks  in Treatment: 20 Information Obtained From Patient Wound History Do you currently have one or more open woundso Yes How many open wounds do you currently haveo 2 Approximately how long have you had your woundso 1 week How have you been treating your wound(s) until nowo nothing Has your wound(s) ever healed and then re-openedo No Have you had any lab work done in the past montho No Have you tested positive for osteomyelitis (bone infection)o No Have you had any tests for circulation on your legso No Have you had other problems associated with your woundso Swelling Constitutional Symptoms (General Health) Complaints and Symptoms: Negative for: Fever; Chills Respiratory Complaints and Symptoms: No Complaints or Symptoms Cardiovascular Complaints and Symptoms: No Complaints or Symptoms Medical History: Positive for: Arrhythmia - a-fib; Hypertension Oncologic Medical History: Positive for: Received Radiation Psychiatric Complaints and Symptoms: No Complaints or Symptoms Immunizations Pneumococcal Vaccine: Received Pneumococcal Vaccination: Yes Implantable Devices Family and Social History Franson, Omarian L. ([id number]) Cancer: No; Diabetes: No; Heart Disease: No; Hereditary Spherocytosis: No; Hypertension: No; Kidney Disease: No; Lung Disease: No; Seizures: No; Stroke: No; Thyroid Problems: No; Tuberculosis: No; Former smoker; Marital Status - Married; Alcohol Use: Rarely - 2oz day; Drug Use: No History; Caffeine Use: Daily; Financial Concerns: No; Food, Clothing or Shelter Needs: No; Support System Lacking: No; Transportation  Concerns: No; Advanced Directives: Yes (Not Provided); Patient does not want information on Advanced Directives; Do not resuscitate: No; Living Will: Yes (Not Provided); Medical Power of Attorney: No Physician Affirmation I have reviewed and agree with the above information. Electronic Signature(s) Signed: 01/07/2018 8:11:30 AM By: Worthy Keeler PA-C Signed: 01/08/2018 3:34:51 PM By: Roger Shelter Entered By: Worthy Keeler on 01/06/2018 13:52:49 Douthitt, Kai L. ([id number]) -------------------------------------------------------------------------------- SuperBill Details Patient Name: PAUTZ, Juluis L. Date of Service: 01/06/2018 Medical Record Number: [id number] Patient Account Number: [id number] Date of Birth/Sex: [birth year] (84 y.o. M) Treating RN: Roger Shelter Primary Care Provider: Ramonita Lab Other Clinician: Referring Provider: Ramonita Lab Treating Provider/Extender: Melburn Hake, Farryn Linares Weeks in Treatment: 20 Diagnosis Coding ICD-10 Codes Code Description [phone] Pressure ulcer of left heel, stage 3 L89.613 Pressure ulcer of right heel, stage 3 I48.2 Chronic atrial fibrillation I10 Essential (primary) hypertension Z79.01 Long term (current) use of anticoagulants Facility Procedures CPT4 Code: [id number] Description: 06269 - DEB SUBQ TISSUE 20 SQ CM/< ICD-10 Diagnosis Description L89.623 Pressure ulcer of left heel, stage 3 Modifier: Quantity: 1 Physician Procedures CPT4 Code: [id number] Description: 03500 - WC PHYS SUBQ TISS 20 SQ CM ICD-10 Diagnosis Description L89.623 Pressure ulcer of left heel, stage 3 Modifier: Quantity: 1 Electronic Signature(s) Signed: 01/07/2018 8:11:30 AM By: Worthy Keeler PA-C Entered By: Worthy Keeler on 01/06/2018 13:54:06

## 2018-01-13 ENCOUNTER — Ambulatory Visit: Payer: PPO | Admitting: Physician Assistant

## 2018-01-14 ENCOUNTER — Encounter: Payer: PPO | Admitting: Physician Assistant

## 2018-01-14 DIAGNOSIS — L89313 Pressure ulcer of right buttock, stage 3: Secondary | ICD-10-CM | POA: Diagnosis not present

## 2018-01-14 DIAGNOSIS — L89623 Pressure ulcer of left heel, stage 3: Secondary | ICD-10-CM | POA: Diagnosis not present

## 2018-01-14 DIAGNOSIS — L89323 Pressure ulcer of left buttock, stage 3: Secondary | ICD-10-CM | POA: Diagnosis not present

## 2018-01-14 DIAGNOSIS — L89620 Pressure ulcer of left heel, unstageable: Secondary | ICD-10-CM | POA: Diagnosis not present

## 2018-01-15 NOTE — Progress Notes (Signed)
Coberly, Shontez L. ([id number]) Visit Report for 01/14/2018 Arrival Information Details Patient Name: [name], [name] Date of Service: 01/14/2018 1:15 PM Medical Record Number: [id number] Patient Account Number: [id number] Date of Birth/Sex: April 01, 1926 (84 y.o. M) Treating RN: Roger Shelter Primary Care Rikki Trosper: Ramonita Lab Other Clinician: Referring Nashonda Limberg: Ramonita Lab Treating Kamalani Mastro/Extender: Melburn Hake, HOYT Weeks in Treatment: 21 Visit Information History Since Last Visit All ordered tests and consults were completed: No Patient Arrived: Gilford Rile Added or deleted any medications: No Arrival Time: 13:19 Any new allergies or adverse reactions: No Accompanied By: spouse Had a fall or experienced change in No Transfer Assistance: None activities of daily living that may affect Patient Identification Verified: Yes risk of falls: Secondary Verification Process Yes Signs or symptoms of abuse/neglect since last visito No Completed: Hospitalized since last visit: No Patient Requires Transmission-Based No Implantable device outside of the clinic excluding No Precautions: cellular tissue based products placed in the center Patient Has Alerts: Yes since last visit: Patient Alerts: Patient on Blood Pain Present Now: No Thinner Warfarin Electronic Signature(s) Signed: 01/14/2018 4:55:40 PM By: Roger Shelter Entered By: Roger Shelter on 01/14/2018 13:25:34 Rorke, Antoinne L. ([id number]) -------------------------------------------------------------------------------- Encounter Discharge Information Details Patient Name: [name], [name] Date of Service: 01/14/2018 1:15 PM Medical Record Number: [id number] Patient Account Number: [id number] Date of Birth/Sex: [birth year] (84 y.o. M) Treating RN: Ahmed Prima Primary Care Maryagnes Carrasco: Ramonita Lab Other Clinician: Referring Galen Russman: Ramonita Lab Treating Renwick Asman/Extender: Melburn Hake, HOYT Weeks in Treatment: 21 Encounter Discharge  Information Items Discharge Condition: Stable Ambulatory Status: Unstable Discharge Destination: Home Transportation: Private Auto Schedule Follow-up Appointment: Yes Clinical Summary of Care: Electronic Signature(s) Signed: 01/14/2018 5:13:20 PM By: Alric Quan Entered By: Alric Quan on 01/14/2018 14:17:39 Cronkright, Alister L. ([id number]) -------------------------------------------------------------------------------- Lower Extremity Assessment Details Patient Name: Thurmond, [name] Date of Service: 01/14/2018 1:15 PM Medical Record Number: [id number] Patient Account Number: [id number] Date of Birth/Sex: Oct 21, 1925 (84 y.o. M) Treating RN: Roger Shelter Primary Care Ivy Puryear: Ramonita Lab Other Clinician: Referring Raeli Wiens: Ramonita Lab Treating Destyni Hoppel/Extender: Melburn Hake, HOYT Weeks in Treatment: 21 Edema Assessment Assessed: [Left: No] [Right: No] Edema: [Left: N] [Right: o] Vascular Assessment Claudication: Claudication Assessment [Left:None] [Right:None] Pulses: Dorsalis Pedis Palpable: [Left:Yes] [Right:Yes] Posterior Tibial Extremity colors, hair growth, and conditions: Extremity Color: [Left:Normal] [Right:Normal] Hair Growth on Extremity: [Left:No] [Right:No] Temperature of Extremity: [Right:Warm] Capillary Refill: [Left:< 3 seconds] [Right:< 3 seconds] Toe Nail Assessment Left: Right: Thick: No No Discolored: No No Deformed: No No Improper Length and Hygiene: Yes Yes Electronic Signature(s) Signed: 01/14/2018 4:55:40 PM By: Roger Shelter Entered By: Roger Shelter on 01/14/2018 13:27:41 [place], [place] ([id number]) -------------------------------------------------------------------------------- Multi Wound Chart Details Patient Name: [name], [name] Date of Service: 01/14/2018 1:15 PM Medical Record Number: [id number] Patient Account Number: [id number] Date of Birth/Sex: 1925/10/19 (84 y.o. M) Treating RN: Ahmed Prima Primary Care Bellina Tokarczyk:  Ramonita Lab Other Clinician: Referring Trisa Cranor: Ramonita Lab Treating Jurnei Latini/Extender: Melburn Hake, HOYT Weeks in Treatment: 21 Vital Signs Height(in): 70 Pulse(bpm): 71 Weight(lbs): 190 Blood Pressure(mmHg): 110/56 Body Mass Index(BMI): 27 Temperature(F): 97.5 Respiratory Rate 16 (breaths/min): Photos: [1:No Photos] [2:No Photos] [N/A:N/A] Wound Location: [1:Left Calcaneus] [2:Right Calcaneus] [N/A:N/A] Wounding Event: [1:Pressure Injury] [2:Pressure Injury] [N/A:N/A] Primary Etiology: [1:Pressure Ulcer] [2:Pressure Ulcer] [N/A:N/A] Comorbid History: [1:Arrhythmia, Hypertension, Received Radiation] [2:Arrhythmia, Hypertension, Received Radiation] [N/A:N/A] Date Acquired: [1:08/12/2017] [2:08/12/2017] [N/A:N/A] Weeks of Treatment: [1:21] [2:21] [N/A:N/A] Wound Status: [1:Open] [2:Open] [N/A:N/A] Measurements L x W x D [1:2.2x2.5x0.6] [2:1x0.8x0.2] [N/A:N/A] (cm) Area (cm) : [1:4.32] [2:0.628] [N/A:N/A] Volume (cm) : [  1:2.592] [2:0.126] [N/A:N/A] % Reduction in Area: [1:81.10%] [2:94.50%] [N/A:N/A] % Reduction in Volume: [1:43.40%] [2:94.50%] [N/A:N/A] Classification: [1:Category/Stage III] [2:Category/Stage III] [N/A:N/A] Exudate Amount: [1:Large] [2:Medium] [N/A:N/A] Exudate Type: [1:Purulent] [2:Serosanguineous] [N/A:N/A] Exudate Color: [1:yellow, brown, green] [2:red, brown] [N/A:N/A] Foul Odor After Cleansing: [1:Yes] [2:No] [N/A:N/A] Odor Anticipated Due to [1:No] [2:N/A] [N/A:N/A] Product Use: Wound Margin: [1:Distinct, outline attached] [2:Distinct, outline attached] [N/A:N/A] Granulation Amount: [1:Medium (34-66%)] [2:Medium (34-66%)] [N/A:N/A] Granulation Quality: [1:Pink] [2:Red, Pink] [N/A:N/A] Necrotic Amount: [1:Medium (34-66%)] [2:Medium (34-66%)] [N/A:N/A] Exposed Structures: [1:Fat Layer (Subcutaneous Tissue) Exposed: Yes Fascia: No Tendon: No Muscle: No Joint: No Bone: No] [2:Fat Layer (Subcutaneous Tissue) Exposed: Yes Fascia: No Tendon: No Muscle: No  Joint: No Bone: No] [N/A:N/A] Epithelialization: [1:None] [2:Medium (34-66%)] [N/A:N/A] Periwound Skin Texture: [1:Excoriation: Yes Induration: No Callus: No] [2:Excoriation: No Induration: No Callus: No] [N/A:N/A] Crepitus: No Crepitus: No Rash: No Rash: No Scarring: No Scarring: No Periwound Skin Moisture: No Abnormalities Noted Maceration: No N/A Dry/Scaly: No Periwound Skin Color: No Abnormalities Noted Atrophie Blanche: No N/A Cyanosis: No Ecchymosis: No Erythema: No Hemosiderin Staining: No Mottled: No Pallor: No Rubor: No Temperature: No Abnormality No Abnormality N/A Tenderness on Palpation: Yes Yes N/A Wound Preparation: Ulcer Cleansing: Ulcer Cleansing: N/A Rinsed/Irrigated with Saline Rinsed/Irrigated with Saline Topical Anesthetic Applied: Topical Anesthetic Applied: Other: lidocaine 4% Other: lidocaine 4% Treatment Notes Electronic Signature(s) Signed: 01/14/2018 5:13:20 PM By: Alric Quan Entered By: Alric Quan on 01/14/2018 14:02:13 Steffey, Dejean L. ([id number]) -------------------------------------------------------------------------------- Cameron Details Patient Name: SLAYTER, [name] Date of Service: 01/14/2018 1:15 PM Medical Record Number: [id number] Patient Account Number: [id number] Date of Birth/Sex: 1925-09-21 (84 y.o. M) Treating RN: Ahmed Prima Primary Care Domenica Weightman: Ramonita Lab Other Clinician: Referring Decker Cogdell: Ramonita Lab Treating Verlin Duke/Extender: Melburn Hake, HOYT Weeks in Treatment: 21 Active Inactive ` Abuse / Safety / Falls / Self Care Management Nursing Diagnoses: History of Falls Potential for falls Goals: Patient will not experience any injury related to falls Date Initiated: 08/19/2017 Target Resolution Date: 12/14/2017 Goal Status: Active Interventions: Assess Activities of Daily Living upon admission and as needed Assess fall risk on admission and as needed Assess: immobility, friction,  shearing, incontinence upon admission and as needed Assess impairment of mobility on admission and as needed per policy Assess personal safety and home safety (as indicated) on admission and as needed Assess self care needs on admission and as needed Notes: ` Nutrition Nursing Diagnoses: Imbalanced nutrition Potential for alteratiion in Nutrition/Potential for imbalanced nutrition Goals: Patient/caregiver agrees to and verbalizes understanding of need to use nutritional supplements and/or vitamins as prescribed Date Initiated: 08/19/2017 Target Resolution Date: 11/16/2017 Goal Status: Active Interventions: Assess patient nutrition upon admission and as needed per policy Notes: ` Orientation to the Wound Care Program Nursing Diagnoses: Carpenito, [name] ([id number]) Knowledge deficit related to the wound healing center program Goals: Patient/caregiver will verbalize understanding of the [facility] Date Initiated: 08/19/2017 Target Resolution Date: 09/14/2017 Goal Status: Active Interventions: Provide education on orientation to the wound center Notes: ` Pain, Acute or Chronic Nursing Diagnoses: Pain, acute or chronic: actual or potential Potential alteration in comfort, pain Goals: Patient/caregiver will verbalize adequate pain control between visits Date Initiated: 08/19/2017 Target Resolution Date: 12/14/2017 Goal Status: Active Interventions: Complete pain assessment as per visit requirements Notes: ` Pressure Nursing Diagnoses: Knowledge deficit related to causes and risk factors for pressure ulcer development Knowledge deficit related to management of pressures ulcers Potential for impaired tissue integrity related to pressure, friction, moisture, and shear Goals: Patient  will remain free from development of additional pressure ulcers Date Initiated: 08/19/2017 Target Resolution Date: 12/14/2017 Goal Status: Active Interventions: Assess: immobility,  friction, shearing, incontinence upon admission and as needed Assess potential for pressure ulcer upon admission and as needed Notes: ` Wound/Skin Impairment Nursing Diagnoses: Impaired tissue integrity Knowledge deficit related to ulceration/compromised skin integrity Iwasaki, Neel L. ([id number]) Goals: Ulcer/skin breakdown will have a volume reduction of 80% by week 12 Date Initiated: 08/19/2017 Target Resolution Date: 12/14/2017 Goal Status: Active Interventions: Assess patient/caregiver ability to perform ulcer/skin care regimen upon admission and as needed Assess ulceration(s) every visit Notes: Electronic Signature(s) Signed: 01/14/2018 5:13:20 PM By: Alric Quan Entered By: Alric Quan on 01/14/2018 14:02:04 Markel, Nikan L. ([id number]) -------------------------------------------------------------------------------- Pain Assessment Details Patient Name: RUFENER, [name] Date of Service: 01/14/2018 1:15 PM Medical Record Number: [id number] Patient Account Number: [id number] Date of Birth/Sex: December 27, 1925 (84 y.o. M) Treating RN: Roger Shelter Primary Care Sagan Maselli: Ramonita Lab Other Clinician: Referring Vanessa Alesi: Ramonita Lab Treating Bless Lisenby/Extender: Melburn Hake, HOYT Weeks in Treatment: 21 Active Problems Location of Pain Severity and Description of Pain Patient Has Paino No Site Locations Pain Management and Medication Current Pain Management: Electronic Signature(s) Signed: 01/14/2018 4:55:40 PM By: Roger Shelter Entered By: Roger Shelter on 01/14/2018 13:25:42 Cumpton, [name] ([id number]) -------------------------------------------------------------------------------- Patient/Caregiver Education Details Patient Name: [name], [name] Date of Service: 01/14/2018 1:15 PM Medical Record Number: [id number] Patient Account Number: [id number] Date of Birth/Gender: [birth year] (84 y.o. M) Treating RN: Ahmed Prima Primary Care Physician: Ramonita Lab Other  Clinician: Referring Physician: Ramonita Lab Treating Physician/Extender: Sharalyn Ink in Treatment: 21 Education Assessment Education Provided To: Patient Education Topics Provided Wound Debridement: Handouts: Wound Debridement Methods: Explain/Verbal Responses: State content correctly Wound/Skin Impairment: Handouts: Caring for Your Ulcer Methods: Explain/Verbal Responses: State content correctly Electronic Signature(s) Signed: 01/14/2018 5:13:20 PM By: Alric Quan Entered By: Alric Quan on 01/14/2018 14:17:57 Rappleye, [name] ([id number]) -------------------------------------------------------------------------------- Wound Assessment Details Patient Name: KOWAL, [name] Date of Service: 01/14/2018 1:15 PM Medical Record Number: [id number] Patient Account Number: [id number] Date of Birth/Sex: [birth year] (84 y.o. M) Treating RN: Roger Shelter Primary Care Darcey Cardy: Ramonita Lab Other Clinician: Referring Hurshel Bouillon: Ramonita Lab Treating Maximillion Gill/Extender: Melburn Hake, HOYT Weeks in Treatment: 21 Wound Status Wound Number: 1 Primary Etiology: Pressure Ulcer Wound Location: Left Calcaneus Wound Status: Open Wounding Event: Pressure Injury Comorbid Arrhythmia, Hypertension, Received History: Radiation Date Acquired: 08/12/2017 Weeks Of Treatment: 21 Clustered Wound: No Photos Photo Uploaded By: Roger Shelter on 01/14/2018 16:54:01 Wound Measurements Length: (cm) 2.2 Width: (cm) 2.5 Depth: (cm) 0.6 Area: (cm) 4.32 Volume: (cm) 2.592 % Reduction in Area: 81.1% % Reduction in Volume: 43.4% Epithelialization: None Tunneling: No Undermining: No Wound Description Classification: Category/Stage III Wound Margin: Distinct, outline attached Exudate Amount: Large Exudate Type: Purulent Exudate Color: yellow, brown, green Foul Odor After Cleansing: Yes Due to Product Use: No Slough/Fibrino Yes Wound Bed Granulation Amount: Medium (34-66%) Exposed  Structure Granulation Quality: Pink Fascia Exposed: No Necrotic Amount: Medium (34-66%) Fat Layer (Subcutaneous Tissue) Exposed: Yes Necrotic Quality: Adherent Slough Tendon Exposed: No Muscle Exposed: No Joint Exposed: No Bone Exposed: No Periwound Skin Texture Schey, Delos L. ([id number]) Texture Color No Abnormalities Noted: No No Abnormalities Noted: Yes Callus: No Temperature / Pain Crepitus: No Temperature: No Abnormality Excoriation: Yes Tenderness on Palpation: Yes Induration: No Rash: No Scarring: No Moisture No Abnormalities Noted: No Wound Preparation Ulcer Cleansing: Rinsed/Irrigated with Saline Topical Anesthetic Applied: Other: lidocaine 4%, Treatment Notes Wound #1 (Left Calcaneus) 1. Cleansed with:  Clean wound with Normal Saline 2. Anesthetic Topical Lidocaine 4% cream to wound bed prior to debridement 4. Dressing Applied: Prisma Ag 5. Secondary Dressing Applied Bordered Foam Dressing Electronic Signature(s) Signed: 01/14/2018 4:55:40 PM By: Roger Shelter Entered By: Roger Shelter on 01/14/2018 13:29:41 Crisco, Abdifatah L. ([id number]) -------------------------------------------------------------------------------- Wound Assessment Details Patient Name: SPENGLER, [name] Date of Service: 01/14/2018 1:15 PM Medical Record Number: [id number] Patient Account Number: [id number] Date of Birth/Sex: 27-Jul-1925 (84 y.o. M) Treating RN: Roger Shelter Primary Care Maytte Jacot: Ramonita Lab Other Clinician: Referring Stryder Poitra: Ramonita Lab Treating Linkyn Gobin/Extender: Melburn Hake, HOYT Weeks in Treatment: 21 Wound Status Wound Number: 2 Primary Etiology: Pressure Ulcer Wound Location: Right Calcaneus Wound Status: Open Wounding Event: Pressure Injury Comorbid Arrhythmia, Hypertension, Received History: Radiation Date Acquired: 08/12/2017 Weeks Of Treatment: 21 Clustered Wound: No Photos Photo Uploaded By: Roger Shelter on 01/14/2018 16:54:01 Wound  Measurements Length: (cm) 1 Width: (cm) 0.8 Depth: (cm) 0.2 Area: (cm) 0.628 Volume: (cm) 0.126 % Reduction in Area: 94.5% % Reduction in Volume: 94.5% Epithelialization: Medium (34-66%) Tunneling: No Undermining: No Wound Description Classification: Category/Stage III Wound Margin: Distinct, outline attached Exudate Amount: Medium Exudate Type: Serosanguineous Exudate Color: red, brown Foul Odor After Cleansing: No Slough/Fibrino Yes Wound Bed Granulation Amount: Medium (34-66%) Exposed Structure Granulation Quality: Red, Pink Fascia Exposed: No Necrotic Amount: Medium (34-66%) Fat Layer (Subcutaneous Tissue) Exposed: Yes Necrotic Quality: Adherent Slough Tendon Exposed: No Muscle Exposed: No Joint Exposed: No Bone Exposed: No Periwound Skin Texture Viveros, [name] ([id number]) Texture Color No Abnormalities Noted: No No Abnormalities Noted: No Callus: No Atrophie Blanche: No Crepitus: No Cyanosis: No Excoriation: No Ecchymosis: No Induration: No Erythema: No Rash: No Hemosiderin Staining: No Scarring: No Mottled: No Pallor: No Moisture Rubor: No No Abnormalities Noted: No Dry / Scaly: No Temperature / Pain Maceration: No Temperature: No Abnormality Tenderness on Palpation: Yes Wound Preparation Ulcer Cleansing: Rinsed/Irrigated with Saline Topical Anesthetic Applied: Other: lidocaine 4%, Treatment Notes Wound #2 (Right Calcaneus) 1. Cleansed with: Clean wound with Normal Saline 2. Anesthetic Topical Lidocaine 4% cream to wound bed prior to debridement 4. Dressing Applied: Hydrogel Prisma Ag 5. Secondary Dressing Applied Bordered Foam Dressing Electronic Signature(s) Signed: 01/14/2018 4:55:40 PM By: Roger Shelter Entered By: Roger Shelter on 01/14/2018 13:30:11 Mariner, [name] ([id number]) -------------------------------------------------------------------------------- Vitals Details Patient Name: SCULLY, [name] Date of  Service: 01/14/2018 1:15 PM Medical Record Number: [id number] Patient Account Number: [id number] Date of Birth/Sex: [birth year] (84 y.o. M) Treating RN: Roger Shelter Primary Care Rachna Schonberger: Ramonita Lab Other Clinician: Referring Willow Shidler: Ramonita Lab Treating Teyana Pierron/Extender: Melburn Hake, HOYT Weeks in Treatment: 21 Vital Signs Time Taken: 13:25 Temperature (F): 97.5 Height (in): 70 Pulse (bpm): 71 Weight (lbs): 190 Respiratory Rate (breaths/min): 16 Body Mass Index (BMI): 27.3 Blood Pressure (mmHg): 110/56 Reference Range: 80 - 120 mg / dl Electronic Signature(s) Signed: 01/14/2018 4:55:40 PM By: Roger Shelter Entered By: Roger Shelter on 01/14/2018 13:26:05

## 2018-01-16 NOTE — Progress Notes (Signed)
[name], [name] ([id number]) Visit Report for 01/14/2018 Chief Complaint Document Details Patient Name: [name], [name] Date of Service: 01/14/2018 1:15 PM Medical Record Number: [id number] Patient Account Number: [id number] Date of Birth/Sex: Feb 28, 1926 (84 y.o. M) Treating RN: Ahmed Prima Primary Care Provider: Ramonita Lab Other Clinician: Referring Provider: Ramonita Lab Treating Provider/Extender: Melburn Hake, HOYT Weeks in Treatment: 21 Information Obtained from: Patient Chief Complaint Bilateral Heel pressure ulcers and bilateral great toe DTIs Electronic Signature(s) Signed: 01/14/2018 11:53:57 PM By: Worthy Keeler PA-C Entered By: Worthy Keeler on 01/14/2018 13:54:38 [name], [name] ([id number]) -------------------------------------------------------------------------------- Debridement Details Patient Name: [name], [name] Date of Service: 01/14/2018 1:15 PM Medical Record Number: [id number] Patient Account Number: [id number] Date of Birth/Sex: 1926-03-22 (84 y.o. M) Treating RN: Ahmed Prima Primary Care Provider: Ramonita Lab Other Clinician: Referring Provider: Ramonita Lab Treating Provider/Extender: Melburn Hake, HOYT Weeks in Treatment: 21 Debridement Performed for Wound #2 Right Calcaneus Assessment: Performed By: Physician STONE III, HOYT E., PA-C Debridement Type: Debridement Pre-procedure Verification/Time Yes - 14:03 Out Taken: Start Time: 14:03 Pain Control: Lidocaine 4% Topical Solution Total Area Debrided (L x W): 1 (cm) x 0.8 (cm) = 0.8 (cm) Tissue and other material Viable, Non-Viable, Callus, Slough, Subcutaneous, Fibrin/Exudate, Slough debrided: Level: Skin/Subcutaneous Tissue Debridement Description: Excisional Instrument: Curette Bleeding: Minimum Hemostasis Achieved: Pressure End Time: 14:05 Procedural Pain: 0 Post Procedural Pain: 0 Response to Treatment: Procedure was tolerated well Level of Consciousness: Awake and Alert Post  Debridement Measurements of Total Wound Length: (cm) 1 Stage: Category/Stage III Width: (cm) 0.8 Depth: (cm) 0.3 Volume: (cm) 0.188 Character of Wound/Ulcer Post Requires Further Debridement Debridement: Post Procedure Diagnosis Same as Pre-procedure Electronic Signature(s) Signed: 01/14/2018 5:13:20 PM By: Alric Quan Signed: 01/14/2018 11:53:57 PM By: Worthy Keeler PA-C Entered By: Alric Quan on 01/14/2018 14:07:40 [name], [name] ([id number]) -------------------------------------------------------------------------------- Debridement Details Patient Name: [name], [name] Date of Service: 01/14/2018 1:15 PM Medical Record Number: [id number] Patient Account Number: [id number] Date of Birth/Sex: 1926-03-15 (84 y.o. M) Treating RN: Ahmed Prima Primary Care Provider: Ramonita Lab Other Clinician: Referring Provider: Ramonita Lab Treating Provider/Extender: Melburn Hake, HOYT Weeks in Treatment: 21 Debridement Performed for Wound #1 Left Calcaneus Assessment: Performed By: Physician STONE III, HOYT E., PA-C Debridement Type: Debridement Pre-procedure Verification/Time Yes - 14:03 Out Taken: Start Time: 14:07 Pain Control: Lidocaine 4% Topical Solution Total Area Debrided (L x W): 2.2 (cm) x 2.5 (cm) = 5.5 (cm) Tissue and other material Viable, Non-Viable, Callus, Slough, Subcutaneous, Fibrin/Exudate, Slough debrided: Level: Skin/Subcutaneous Tissue Debridement Description: Excisional Instrument: Curette Bleeding: Minimum Hemostasis Achieved: Pressure End Time: 14:10 Procedural Pain: 0 Post Procedural Pain: 0 Response to Treatment: Procedure was tolerated well Level of Consciousness: Awake and Alert Post Debridement Measurements of Total Wound Length: (cm) 2.2 Stage: Category/Stage III Width: (cm) 2.5 Depth: (cm) 0.7 Volume: (cm) 3.024 Character of Wound/Ulcer Post Requires Further Debridement Debridement: Post Procedure Diagnosis Same as  Pre-procedure Electronic Signature(s) Signed: 01/14/2018 5:13:20 PM By: Alric Quan Signed: 01/14/2018 11:53:57 PM By: Worthy Keeler PA-C Entered By: Alric Quan on 01/14/2018 14:10:47 [name], [name] ([id number]) -------------------------------------------------------------------------------- HPI Details Patient Name: [name], [name] Date of Service: 01/14/2018 1:15 PM Medical Record Number: [id number] Patient Account Number: [id number] Date of Birth/Sex: May 13, 1926 (84 y.o. M) Treating RN: Ahmed Prima Primary Care Provider: Ramonita Lab Other Clinician: Referring Provider: Ramonita Lab Treating Provider/Extender: Melburn Hake, HOYT Weeks in Treatment: 21 History of Present Illness Associated Signs and Symptoms: Patient has a history of chronic atrophic relation, hypertension,  and long-term use of anticoagulants. He also had a fall with pelvic fracture which occurred on January 2019. HPI Description: 08/19/17 patient presents today for initial evaluation and our clinic concerning issues he has been having with the bilateral heels as well as the lateral great toe was since he was admitted to the nursing facility following a pelvic fracture. Initially he was spending a significant amount of time in the bed although he is now up more often since the healing process has progressed along the way obviously. When he is up he's spending most of the time sitting in a wheelchair at this point. When he is in bed he lays on his back and the covers/sheets are pushing down on his toes bilaterally which I think it may be what's causing the issues with his deep tissue injury to the bilateral great toes. With that being said the hills also appear to be pressure in nature and I think that laying in the bed getting pressure to the sites is what has led to the formation of these on stage will pressure ulcers. Fortunately there does not appear to be any infection in both areas of ulceration of the heel  appear to be stable at this point. Patient does not have any significant pain at the site she does have some of the eschar along the edges which is starting to lift up and come all hopefully slowly but surely this will occur. Patient did have arterial studies performed April 2018 which showed a normal TBI on the right knee slightly depressed TBI on the left but this does not appear to have changed significantly at that point. Specifically this was 0.75 on the right and 0.61 on the left. 09/13/17 on evaluation today patient presents for a follow-up evaluation regarding his bilateral heal ulcers. He has been tolerating the dressing changes fairly well although he did have some bleeding earlier today when it appears some of the skin on the surrounding portion of the wound got pulled where this is starting to flake off. Subsequently there does not appear to be any bleeding right now. Since I last saw him he has been discharged from the facility and is now back at home. His wife is having a very difficult time being able to get him up and in the car in order to come for an appointment which is why they missed the last appointment have not been able to come back sooner. Fortunately the he does not have any signs of infection. No fevers chills noted. He does have difficulty walking due to pain in the bilateral heels. 10/11/17 on evaluation today patient presents for follow-up evaluation concerning his ongoing bilateral heal ulcers. He has been tolerating the dressing changes which were Betadine paints daily without complication. I have not seen him since September 13, 2017. Pressure that I saw him August 19, 2017. Subsequently those are the only prior visits before today. His left heel actually seems to have a significant amount of eschar that is starting to lift up at this point and seems to be a little more loose as far as drainage building up underneath the eschar. The right eschar though there are some parts  lifting up seems to still be fairly stable which is good news. His toe ulcer is almost completely healed a lot of that eschar came off and it looks very well u 01/06/18 on evaluation today patient actually appears to be doing excellent in regard to his bilateral heal ulcers. The right heel  is very close to completely closing and overall this is doing excellent. Left heel #2 great improvement as it has been for the past several weeks. I'm extremely pleased in this regard. Overall the patient is also happy to his wife seems to be doing an excellent job caring for him in my pinion. 01/14/18 on evaluation today patient actually appears to be doing better in regard to both heal ulcers. The right heel is almost completely healed there was some callous buildup around the edge of the wound but overall I feel like things are progressing quite nicely. In regard to the left heel also feel like there's good epithelialization however this has been obviously somewhat slower it was also much larger initially when the eschar was removed so again I'm not really too worried about it I think it's progressing in a good fashion. Overall I feel like the patient is doing excellent. Electronic Signature(s) Signed: 01/14/2018 11:53:57 PM By: Worthy Keeler PA-C Entered By: Worthy Keeler on 01/14/2018 23:44:07 [name], [name] ([id number]) [name], [name] ([id number]) -------------------------------------------------------------------------------- Physical Exam Details Patient Name: [name], [name] Date of Service: 01/14/2018 1:15 PM Medical Record Number: [id number] Patient Account Number: [id number] Date of Birth/Sex: Nov 25, 1925 (84 y.o. M) Treating RN: Ahmed Prima Primary Care Provider: Ramonita Lab Other Clinician: Referring Provider: Ramonita Lab Treating Provider/Extender: STONE III, HOYT Weeks in Treatment: 56 Constitutional Well-nourished and well-hydrated in no acute distress. Respiratory normal breathing  without difficulty. Psychiatric this patient is able to make decisions and demonstrates good insight into disease process. Alert and Oriented x 3. pleasant and cooperative. Notes Patient's wound beds both did have some Slough requiring debridement as well as callous surrounding the surface of the wound especially on the right this was cleaned away post debridement the wound beds appear to be much better. Electronic Signature(s) Signed: 01/14/2018 11:53:57 PM By: Worthy Keeler PA-C Entered By: Worthy Keeler on 01/14/2018 23:44:55 Alcocer, Keontre L. ([id number]) -------------------------------------------------------------------------------- Physician Orders Details Patient Name: BAYLOR, CORTEZ L. Date of Service: 01/14/2018 1:15 PM Medical Record Number: [id number] Patient Account Number: [id number] Date of Birth/Sex: 16-Mar-1926 (84 y.o. M) Treating RN: Ahmed Prima Primary Care Provider: Ramonita Lab Other Clinician: Referring Provider: Ramonita Lab Treating Provider/Extender: Melburn Hake, HOYT Weeks in Treatment: 21 Verbal / Phone Orders: Yes Clinician: Carolyne Fiscal, Debi Read Back and Verified: Yes Diagnosis Coding ICD-10 Coding Code Description L89.623 Pressure ulcer of left heel, stage 3 L89.613 Pressure ulcer of right heel, stage 3 I48.2 Chronic atrial fibrillation I10 Essential (primary) hypertension Z79.01 Long term (current) use of anticoagulants Wound Cleansing Wound #1 Left Calcaneus o Clean wound with Normal Saline. o Cleanse wound with mild soap and water o May Shower, gently pat wound dry prior to applying new dressing. Wound #2 Right Calcaneus o Clean wound with Normal Saline. o Cleanse wound with mild soap and water o May Shower, gently pat wound dry prior to applying new dressing. Anesthetic (add to Medication List) Wound #1 Left Calcaneus o Topical Lidocaine 4% cream applied to wound bed prior to debridement (In Clinic Only). Wound #2 Right  Calcaneus o Topical Lidocaine 4% cream applied to wound bed prior to debridement (In Clinic Only). Skin Barriers/Peri-Wound Care Wound #1 Left Calcaneus o Skin Prep Wound #2 Right Calcaneus o Skin Prep Primary Wound Dressing Wound #1 Left Calcaneus o Silver Collagen - place in wound dry Wound #2 Right Calcaneus o Hydrogel - or [place] Jelly o Silver Collagen Secondary Dressing [place]con, [place] ([id number]) Wound #1 Left Calcaneus   o Dry Gauze o Boardered Foam Dressing Wound #2 Right Calcaneus o Dry Gauze o Boardered Foam Dressing Dressing Change Frequency Wound #1 Left Calcaneus o Change dressing every other day. Wound #2 Right Calcaneus o Change dressing every other day. Follow-up Appointments Wound #1 Left Calcaneus o Return Appointment in 1 week. Wound #2 Right Calcaneus o Return Appointment in 1 week. Off-Loading Wound #1 Left Calcaneus o Turn and reposition every 2 hours Wound #2 Right Calcaneus o Turn and reposition every 2 hours Additional Orders / Instructions Wound #1 Left Calcaneus o Increase protein intake. Wound #2 Right Calcaneus o Increase protein intake. Patient Medications Allergies: No Known Allergies Notifications Medication Indication Start End lidocaine DOSE 1 - topical 4 % cream - 1 cream topical Electronic Signature(s) Signed: 01/14/2018 5:13:20 PM By: Alric Quan Signed: 01/14/2018 11:53:57 PM By: Worthy Keeler PA-C Entered By: Alric Quan on 01/14/2018 14:09:35 [name], [name] [place] ([id number]) -------------------------------------------------------------------------------- Prescription 01/14/2018 Patient Name: DILYN, SMILES L. Provider: Worthy Keeler PA-C Date of Birth: 08/07/1925 NPI#: [id number] Sex: Jerilynn Mages DEA#: [id number] Phone #: [phone] License #: Patient Address: [facility] [place], [place] [address] [address], [place], [place] [address] [phone] Allergies No Known Allergies Medication Medication: Route: Strength: Form: lidocaine 4 % topical cream topical 4% cream Class: TOPICAL LOCAL ANESTHETICS Dose: Frequency / Time: Indication: 1 1 cream topical Number of Refills: Number of Units: 0 Generic Substitution: Start Date: End Date: One Time Use: Substitution Permitted No Note to Pharmacy: Signature(s): Date(s): Electronic Signature(s) Signed: 01/14/2018 5:13:20 PM By: Alric Quan Signed: 01/14/2018 11:53:57 PM By: Worthy Keeler PA-C Entered By: Alric Quan on 01/14/2018 14:09:37 [name], [name] ([id number]) --------------------------------------------------------------------------------  Problem List Details Patient Name: CRUZAN, [name] Date of Service: 01/14/2018 1:15 PM Medical Record Number: [id number] Patient Account Number: [id number] Date of Birth/Sex: Dec 31, 1925 (84 y.o. M) Treating RN: Ahmed Prima Primary Care Provider: Ramonita Lab Other Clinician: Referring Provider: Ramonita Lab Treating Provider/Extender: Melburn Hake, HOYT Weeks in Treatment: 21 Active Problems ICD-10 Evaluated Encounter Code Description Active Date Today Diagnosis L89.623 Pressure ulcer of left heel, stage 3 08/19/2017 No Yes L89.613 Pressure ulcer of right heel, stage 3 08/19/2017 No Yes I48.2 Chronic atrial fibrillation 08/19/2017 No Yes I10 Essential (primary) hypertension 08/19/2017 No Yes Z79.01 Long term (current) use of anticoagulants 08/19/2017 No Yes Inactive Problems Resolved Problems Electronic Signature(s) Signed: 01/14/2018 11:53:57 PM By: Worthy Keeler PA-C Entered By: Worthy Keeler on 01/14/2018 13:54:30 Carrol, Brenan L. ([id number]) -------------------------------------------------------------------------------- Progress Note Details Patient Name: Old, Khameron L. Date of Service: 01/14/2018 1:15 PM Medical Record Number: [id number] Patient  Account Number: [id number] Date of Birth/Sex: 07-31-1925 (84 y.o. M) Treating RN: Ahmed Prima Primary Care Provider: Ramonita Lab Other Clinician: Referring Provider: Ramonita Lab Treating Provider/Extender: Melburn Hake, HOYT Weeks in Treatment: 21 Subjective Chief Complaint Information obtained from Patient Bilateral Heel pressure ulcers and bilateral great toe DTIs History of Present Illness (HPI) The following HPI elements were documented for the patient's wound: Associated Signs and Symptoms: Patient has a history of chronic atrophic relation, hypertension, and long-term use of anticoagulants. He also had a fall with pelvic fracture which occurred on January 2019. 08/19/17 patient presents today for initial evaluation and our clinic concerning issues he has been having with the bilateral heels as well as the lateral great toe was since he was admitted to the nursing facility following a pelvic fracture. Initially he was spending a significant  amount of time in the bed although he is now up more often since the healing process has progressed along the way obviously. When he is up he's spending most of the time sitting in a wheelchair at this point. When he is in bed he lays on his back and the covers/sheets are pushing down on his toes bilaterally which I think it may be what's causing the issues with his deep tissue injury to the bilateral great toes. With that being said the hills also appear to be pressure in nature and I think that laying in the bed getting pressure to the sites is what has led to the formation of these on stage will pressure ulcers. Fortunately there does not appear to be any infection in both areas of ulceration of the heel appear to be stable at this point. Patient does not have any significant pain at the site she does have some of the eschar along the edges which is starting to lift up and come all hopefully slowly but surely this will occur. Patient did have  arterial studies performed April 2018 which showed a normal TBI on the right knee slightly depressed TBI on the left but this does not appear to have changed significantly at that point. Specifically this was 0.75 on the right and 0.61 on the left. 09/13/17 on evaluation today patient presents for a follow-up evaluation regarding his bilateral heal ulcers. He has been tolerating the dressing changes fairly well although he did have some bleeding earlier today when it appears some of the skin on the surrounding portion of the wound got pulled where this is starting to flake off. Subsequently there does not appear to be any bleeding right now. Since I last saw him he has been discharged from the facility and is now back at home. His wife is having a very difficult time being able to get him up and in the car in order to come for an appointment which is why they missed the last appointment have not been able to come back sooner. Fortunately the he does not have any signs of infection. No fevers chills noted. He does have difficulty walking due to pain in the bilateral heels. 10/11/17 on evaluation today patient presents for follow-up evaluation concerning his ongoing bilateral heal ulcers. He has been tolerating the dressing changes which were Betadine paints daily without complication. I have not seen him since September 13, 2017. Pressure that I saw him August 19, 2017. Subsequently those are the only prior visits before today. His left heel actually seems to have a significant amount of eschar that is starting to lift up at this point and seems to be a little more loose as far as drainage building up underneath the eschar. The right eschar though there are some parts lifting up seems to still be fairly stable which is good news. His toe ulcer is almost completely healed a lot of that eschar came off and it looks very well u 01/06/18 on evaluation today patient actually appears to be doing excellent in regard  to his bilateral heal ulcers. The right heel is very close to completely closing and overall this is doing excellent. Left heel #2 great improvement as it has been for the past several weeks. I'm extremely pleased in this regard. Overall the patient is also happy to his wife seems to be doing an excellent job caring for him in my pinion. 01/14/18 on evaluation today patient actually appears to be doing better in  regard to both heal ulcers. The right heel is almost completely healed there was some callous buildup around the edge of the wound but overall I feel like things are progressing Schoenberger, [place] ([id number]) quite nicely. In regard to the left heel also feel like there's good epithelialization however this has been obviously somewhat slower it was also much larger initially when the eschar was removed so again I'm not really too worried about it I think it's progressing in a good fashion. Overall I feel like the patient is doing excellent. Patient History Information obtained from Patient. Family History No family history of Cancer, Diabetes, Heart Disease, Hereditary Spherocytosis, Hypertension, Kidney Disease, Lung Disease, Seizures, Stroke, Thyroid Problems, Tuberculosis. Social History Former smoker, Marital Status - Married, Alcohol Use - Rarely - 2oz day, Drug Use - No History, Caffeine Use - Daily. Review of Systems (ROS) Constitutional Symptoms (General Health) Denies complaints or symptoms of Fever, Chills, Marked Weight Change. Respiratory The patient has no complaints or symptoms. Cardiovascular The patient has no complaints or symptoms. Psychiatric The patient has no complaints or symptoms. Objective Constitutional Well-nourished and well-hydrated in no acute distress. Vitals Time Taken: 1:25 PM, Height: 70 in, Weight: 190 lbs, BMI: 27.3, Temperature: 97.5 F, Pulse: 71 bpm, Respiratory Rate: 16 breaths/min, Blood Pressure: 110/56 mmHg. Respiratory normal breathing  without difficulty. Psychiatric this patient is able to make decisions and demonstrates good insight into disease process. Alert and Oriented x 3. pleasant and cooperative. General Notes: Patient's wound beds both did have some Slough requiring debridement as well as callous surrounding the surface of the wound especially on the right this was cleaned away post debridement the wound beds appear to be much better. Integumentary (Hair, Skin) Wound #1 status is Open. Original cause of wound was Pressure Injury. The wound is located on the Left Calcaneus. The wound measures 2.2cm length x 2.5cm width x 0.6cm depth; 4.32cm^2 area and 2.592cm^3 volume. There is Fat Layer (Subcutaneous Tissue) Exposed exposed. There is no tunneling or undermining noted. There is a large amount of purulent drainage noted. Foul odor after cleansing was noted. The wound margin is distinct with the outline attached to the wound Suh, [place] ([id number]) base. There is medium (34-66%) pink granulation within the wound bed. There is a medium (34-66%) amount of necrotic tissue within the wound bed including Adherent Slough. The periwound skin appearance had no abnormalities noted for color. The periwound skin appearance exhibited: Excoriation. The periwound skin appearance did not exhibit: Callus, Crepitus, Induration, Rash, Scarring. Periwound temperature was noted as No Abnormality. The periwound has tenderness on palpation. Wound #2 status is Open. Original cause of wound was Pressure Injury. The wound is located on the Right Calcaneus. The wound measures 1cm length x 0.8cm width x 0.2cm depth; 0.628cm^2 area and 0.126cm^3 volume. There is Fat Layer (Subcutaneous Tissue) Exposed exposed. There is no tunneling or undermining noted. There is a medium amount of serosanguineous drainage noted. The wound margin is distinct with the outline attached to the wound base. There is medium (34-66%) red, pink granulation within  the wound bed. There is a medium (34-66%) amount of necrotic tissue within the wound bed including Adherent Slough. The periwound skin appearance did not exhibit: Callus, Crepitus, Excoriation, Induration, Rash, Scarring, Dry/Scaly, Maceration, Atrophie Blanche, Cyanosis, Ecchymosis, Hemosiderin Staining, Mottled, Pallor, Rubor, Erythema. Periwound temperature was noted as No Abnormality. The periwound has tenderness on palpation. Assessment Active Problems ICD-10 Pressure ulcer of left heel, stage 3 Pressure ulcer of right heel,  stage 3 Chronic atrial fibrillation Essential (primary) hypertension Long term (current) use of anticoagulants Procedures Wound #1 Pre-procedure diagnosis of Wound #1 is a Pressure Ulcer located on the Left Calcaneus . There was a Excisional Skin/Subcutaneous Tissue Debridement with a total area of 5.5 sq cm performed by STONE III, HOYT E., PA-C. With the following instrument(s): Curette to remove Viable and Non-Viable tissue/material. Material removed includes Callus, Subcutaneous Tissue, Slough, and Fibrin/Exudate after achieving pain control using Lidocaine 4% Topical Solution. No specimens were taken. A time out was conducted at 14:03, prior to the start of the procedure. A Minimum amount of bleeding was controlled with Pressure. The procedure was tolerated well with a pain level of 0 throughout and a pain level of 0 following the procedure. Patient s Level of Consciousness post procedure was recorded as Awake and Alert. Post Debridement Measurements: 2.2cm length x 2.5cm width x 0.7cm depth; 3.024cm^3 volume. Post debridement Stage noted as Category/Stage III. Character of Wound/Ulcer Post Debridement requires further debridement. Post procedure Diagnosis Wound #1: Same as Pre-Procedure Wound #2 Pre-procedure diagnosis of Wound #2 is a Pressure Ulcer located on the Right Calcaneus . There was a Excisional Skin/Subcutaneous Tissue Debridement with a total area  of 0.8 sq cm performed by STONE III, HOYT E., PA-C. With the following instrument(s): Curette to remove Viable and Non-Viable tissue/material. Material removed includes Callus, Subcutaneous Tissue, Slough, and Fibrin/Exudate after achieving pain control using Lidocaine 4% Topical Solution. No specimens were taken. A time out was conducted at 14:03, prior to the start of the procedure. A Minimum amount of bleeding was controlled with Pressure. The procedure was tolerated well with a pain level of 0 throughout and a pain level of 0 following the procedure. Patient s Level of Consciousness post procedure was recorded as Awake and Alert. Post Collar, [place] ([id number]) Debridement Measurements: 1cm length x 0.8cm width x 0.3cm depth; 0.188cm^3 volume. Post debridement Stage noted as Category/Stage III. Character of Wound/Ulcer Post Debridement requires further debridement. Post procedure Diagnosis Wound #2: Same as Pre-Procedure Plan Wound Cleansing: Wound #1 Left Calcaneus: Clean wound with Normal Saline. Cleanse wound with mild soap and water May Shower, gently pat wound dry prior to applying new dressing. Wound #2 Right Calcaneus: Clean wound with Normal Saline. Cleanse wound with mild soap and water May Shower, gently pat wound dry prior to applying new dressing. Anesthetic (add to Medication List): Wound #1 Left Calcaneus: Topical Lidocaine 4% cream applied to wound bed prior to debridement (In Clinic Only). Wound #2 Right Calcaneus: Topical Lidocaine 4% cream applied to wound bed prior to debridement (In Clinic Only). Skin Barriers/Peri-Wound Care: Wound #1 Left Calcaneus: Skin Prep Wound #2 Right Calcaneus: Skin Prep Primary Wound Dressing: Wound #1 Left Calcaneus: Silver Collagen - place in wound dry Wound #2 Right Calcaneus: Hydrogel - or [place] Jelly Silver Collagen Secondary Dressing: Wound #1 Left Calcaneus: Dry Gauze Boardered Foam Dressing Wound #2 Right  Calcaneus: Dry Gauze Boardered Foam Dressing Dressing Change Frequency: Wound #1 Left Calcaneus: Change dressing every other day. Wound #2 Right Calcaneus: Change dressing every other day. Follow-up Appointments: Wound #1 Left Calcaneus: Return Appointment in 1 week. Wound #2 Right Calcaneus: Return Appointment in 1 week. Off-Loading: Wound #1 Left Calcaneus: Turn and reposition every 2 hours Wound #2 Right Calcaneus: Turn and reposition every 2 hours Maqueda, Missael L. ([id number]) Additional Orders / Instructions: Wound #1 Left Calcaneus: Increase protein intake. Wound #2 Right Calcaneus: Increase protein intake. The following medication(s) was prescribed: lidocaine topical  4 % cream 1 1 cream topical was prescribed at facility I'm gonna suggest at this point that we continue with the current wound care measures although we will use hydrogel to moisten the Prisma for the right heel and for the left heel we will utilize the Prisma dry in order to avoid adding any additional moisture. Patient's wife was advised as to what to do in this regard. Otherwise I think things are progressing quite nicely and we will continue at this point with the same course. Please see above for specific wound care orders. We will see patient for re-evaluation in 1 week(s) here in the clinic. If anything worsens or changes patient will contact our office for additional recommendations. Electronic Signature(s) Signed: 01/14/2018 11:53:57 PM By: Worthy Keeler PA-C Entered By: Worthy Keeler on 01/14/2018 23:45:06 Thall, Kaylen L. ([id number]) -------------------------------------------------------------------------------- ROS/PFSH Details Patient Name: DEESE, Dayan L. Date of Service: 01/14/2018 1:15 PM Medical Record Number: [id number] Patient Account Number: [id number] Date of Birth/Sex: 09/12/1925 (84 y.o. M) Treating RN: Ahmed Prima Primary Care Provider: Ramonita Lab Other Clinician: Referring  Provider: Ramonita Lab Treating Provider/Extender: Melburn Hake, HOYT Weeks in Treatment: 21 Information Obtained From Patient Wound History Do you currently have one or more open woundso Yes How many open wounds do you currently haveo 2 Approximately how long have you had your woundso 1 week How have you been treating your wound(s) until nowo nothing Has your wound(s) ever healed and then re-openedo No Have you had any lab work done in the past montho No Have you tested positive for osteomyelitis (bone infection)o No Have you had any tests for circulation on your legso No Have you had other problems associated with your woundso Swelling Constitutional Symptoms (General Health) Complaints and Symptoms: Negative for: Fever; Chills; Marked Weight Change Respiratory Complaints and Symptoms: No Complaints or Symptoms Cardiovascular Complaints and Symptoms: No Complaints or Symptoms Medical History: Positive for: Arrhythmia - a-fib; Hypertension Oncologic Medical History: Positive for: Received Radiation Psychiatric Complaints and Symptoms: No Complaints or Symptoms Immunizations Pneumococcal Vaccine: Received Pneumococcal Vaccination: Yes Implantable Devices Family and Social History Jowett, Trennon L. ([id number]) Cancer: No; Diabetes: No; Heart Disease: No; Hereditary Spherocytosis: No; Hypertension: No; Kidney Disease: No; Lung Disease: No; Seizures: No; Stroke: No; Thyroid Problems: No; Tuberculosis: No; Former smoker; Marital Status - Married; Alcohol Use: Rarely - 2oz day; Drug Use: No History; Caffeine Use: Daily; Financial Concerns: No; Food, Clothing or Shelter Needs: No; Support System Lacking: No; Transportation Concerns: No; Advanced Directives: Yes (Not Provided); Patient does not want information on Advanced Directives; Do not resuscitate: No; Living Will: Yes (Not Provided); Medical Power of Attorney: No Physician Affirmation I have reviewed and agree with the above  information. Electronic Signature(s) Signed: 01/14/2018 11:53:57 PM By: Worthy Keeler PA-C Signed: 01/15/2018 5:25:23 PM By: Alric Quan Entered By: Worthy Keeler on 01/14/2018 23:44:30 Hofmeister, Naszir L. ([id number]) -------------------------------------------------------------------------------- SuperBill Details Patient Name: FERNS, Lc L. Date of Service: 01/14/2018 Medical Record Number: [id number] Patient Account Number: [id number] Date of Birth/Sex: Jan 24, 1926 (84 y.o. M) Treating RN: Ahmed Prima Primary Care Provider: Ramonita Lab Other Clinician: Referring Provider: Ramonita Lab Treating Provider/Extender: Melburn Hake, HOYT Weeks in Treatment: 21 Diagnosis Coding ICD-10 Codes Code Description [phone] Pressure ulcer of left heel, stage 3 L89.613 Pressure ulcer of right heel, stage 3 I48.2 Chronic atrial fibrillation I10 Essential (primary) hypertension Z79.01 Long term (current) use of anticoagulants Facility Procedures CPT4 Code: [id number] Description: 11042 - DEB SUBQ TISSUE 20 SQ CM/< ICD-10  Diagnosis Description L89.623 Pressure ulcer of left heel, stage 3 L89.613 Pressure ulcer of right heel, stage 3 Modifier: Quantity: 1 Physician Procedures CPT4 Code: [id number] Description: 55974 - WC PHYS SUBQ TISS 20 SQ CM ICD-10 Diagnosis Description L89.623 Pressure ulcer of left heel, stage 3 L89.613 Pressure ulcer of right heel, stage 3 Modifier: Quantity: 1 Electronic Signature(s) Signed: 01/14/2018 11:53:57 PM By: Worthy Keeler PA-C Entered By: Worthy Keeler on 01/14/2018 23:45:18

## 2018-01-21 ENCOUNTER — Encounter: Payer: PPO | Admitting: Physician Assistant

## 2018-01-21 DIAGNOSIS — L89623 Pressure ulcer of left heel, stage 3: Secondary | ICD-10-CM | POA: Diagnosis not present

## 2018-01-21 DIAGNOSIS — L89613 Pressure ulcer of right heel, stage 3: Secondary | ICD-10-CM | POA: Diagnosis not present

## 2018-01-23 NOTE — Progress Notes (Signed)
Standage, Ona L. ([id number]) Visit Report for 01/21/2018 Arrival Information Details Patient Name: [name], [name] Date of Service: 01/21/2018 1:30 PM Medical Record Number: [id number] Patient Account Number: [id number] Date of Birth/Sex: May 02, 1926 (84 y.o. M) Treating RN: Montey Hora Primary Care Leatta Alewine: Ramonita Lab Other Clinician: Referring Mercy Leppla: Ramonita Lab Treating Wallie Lagrand/Extender: Melburn Hake, HOYT Weeks in Treatment: 22 Visit Information History Since Last Visit Added or deleted any medications: No Patient Arrived: Walker Any new allergies or adverse reactions: No Arrival Time: 13:31 Had a fall or experienced change in No Accompanied By: spouse activities of daily living that may affect Transfer Assistance: None risk of falls: Patient Identification Verified: Yes Signs or symptoms of abuse/neglect since last visito No Secondary Verification Process Yes Hospitalized since last visit: No Completed: Implantable device outside of the clinic excluding No Patient Requires Transmission-Based No cellular tissue based products placed in the center Precautions: since last visit: Patient Has Alerts: Yes Has Dressing in Place as Prescribed: Yes Patient Alerts: Patient on Blood Pain Present Now: No Thinner Warfarin Electronic Signature(s) Signed: 01/21/2018 5:46:04 PM By: Montey Hora Entered By: Montey Hora on 01/21/2018 13:35:13 Dafoe, [name] ([id number]) -------------------------------------------------------------------------------- Encounter Discharge Information Details Patient Name: [name], [name] Date of Service: 01/21/2018 1:30 PM Medical Record Number: [id number] Patient Account Number: [id number] Date of Birth/Sex: [birth year] (84 y.o. M) Treating RN: Roger Shelter Primary Care Earvin Blazier: Ramonita Lab Other Clinician: Referring Gyselle Matthew: Ramonita Lab Treating Evalee Gerard/Extender: Melburn Hake, HOYT Weeks in Treatment: 22 Encounter Discharge  Information Items Discharge Condition: Stable Ambulatory Status: Walker Discharge Destination: Home Transportation: Private Auto Schedule Follow-up Appointment: Yes Clinical Summary of Care: Electronic Signature(s) Signed: 01/21/2018 4:58:15 PM By: Roger Shelter Entered By: Roger Shelter on 01/21/2018 14:23:18 Wickliff, [name] ([id number]) -------------------------------------------------------------------------------- Lower Extremity Assessment Details Patient Name: [name], [name] Date of Service: 01/21/2018 1:30 PM Medical Record Number: [id number] Patient Account Number: [id number] Date of Birth/Sex: 1925-10-09 (84 y.o. M) Treating RN: Montey Hora Primary Care Avyan Livesay: Ramonita Lab Other Clinician: Referring Amaria Mundorf: Ramonita Lab Treating Idania Desouza/Extender: Melburn Hake, HOYT Weeks in Treatment: 22 Vascular Assessment Pulses: Dorsalis Pedis Palpable: [Left:Yes] [Right:Yes] Posterior Tibial Extremity colors, hair growth, and conditions: Extremity Color: [Left:Normal] [Right:Normal] Hair Growth on Extremity: [Left:No] [Right:No] Temperature of Extremity: [Left:Warm] [Right:Warm] Capillary Refill: [Left:< 3 seconds] [Right:< 3 seconds] Toe Nail Assessment Left: Right: Thick: Yes Yes Discolored: Yes Yes Deformed: No No Improper Length and Hygiene: No No Electronic Signature(s) Signed: 01/21/2018 5:46:04 PM By: Montey Hora Entered By: Montey Hora on 01/21/2018 13:44:37 Nghiem, Mychal L. ([id number]) -------------------------------------------------------------------------------- Multi Wound Chart Details Patient Name: [name], [name] Date of Service: 01/21/2018 1:30 PM Medical Record Number: [id number] Patient Account Number: [id number] Date of Birth/Sex: Mar 29, 1926 (84 y.o. M) Treating RN: Ahmed Prima Primary Care Kal Chait: Ramonita Lab Other Clinician: Referring Chris Cripps: Ramonita Lab Treating Larkin Morelos/Extender: Melburn Hake, HOYT Weeks in Treatment: 22 Vital  Signs Height(in): 70 Pulse(bpm): 75 Weight(lbs): 190 Blood Pressure(mmHg): 106/65 Body Mass Index(BMI): 27 Temperature(F): 97.9 Respiratory Rate 16 (breaths/min): Photos: [1:No Photos] [2:No Photos] [N/A:N/A] Wound Location: [1:Left Calcaneus] [2:Right Calcaneus] [N/A:N/A] Wounding Event: [1:Pressure Injury] [2:Pressure Injury] [N/A:N/A] Primary Etiology: [1:Pressure Ulcer] [2:Pressure Ulcer] [N/A:N/A] Comorbid History: [1:Arrhythmia, Hypertension, Received Radiation] [2:Arrhythmia, Hypertension, Received Radiation] [N/A:N/A] Date Acquired: [1:08/12/2017] [2:08/12/2017] [N/A:N/A] Weeks of Treatment: [1:22] [2:22] [N/A:N/A] Wound Status: [1:Open] [2:Open] [N/A:N/A] Measurements L x W x D [1:1.3x1.9x0.1] [2:1.1x0.3x0.1] [N/A:N/A] (cm) Area (cm) : [1:1.94] [8:5.631] [N/A:N/A] Volume (cm) : [1:0.194] [2:0.026] [N/A:N/A] % Reduction in Area: [1:91.50%] [2:97.70%] [N/A:N/A] % Reduction in Volume: [1:95.80%] [2:98.90%] [N/A:N/A]  Classification: [1:Category/Stage III] [2:Category/Stage III] [N/A:N/A] Exudate Amount: [1:Large] [2:None Present] [N/A:N/A] Exudate Type: [1:Purulent] [2:N/A] [N/A:N/A] Exudate Color: [1:yellow, brown, green] [2:N/A] [N/A:N/A] Foul Odor After Cleansing: [1:Yes] [2:No] [N/A:N/A] Odor Anticipated Due to [1:No] [2:N/A] [N/A:N/A] Product Use: Wound Margin: [1:Distinct, outline attached] [2:Distinct, outline attached] [N/A:N/A] Granulation Amount: [1:Medium (34-66%)] [2:None Present (0%)] [N/A:N/A] Granulation Quality: [1:Pink] [2:N/A] [N/A:N/A] Necrotic Amount: [1:Medium (34-66%)] [2:Large (67-100%)] [N/A:N/A] Necrotic Tissue: [1:Adherent Slough] [2:Eschar] [N/A:N/A] Exposed Structures: [1:Fat Layer (Subcutaneous Tissue) Exposed: Yes Fascia: No Tendon: No Muscle: No Joint: No Bone: No] [2:Fat Layer (Subcutaneous Tissue) Exposed: Yes Fascia: No Tendon: No Muscle: No Joint: No Bone: No] [N/A:N/A] Epithelialization: [1:None] [2:Medium (34-66%)] [N/A:N/A] Periwound  Skin Texture: [1:Excoriation: Yes Induration: No] [2:Excoriation: No Induration: No] [N/A:N/A] Callus: No Callus: No Crepitus: No Crepitus: No Rash: No Rash: No Scarring: No Scarring: No Periwound Skin Moisture: No Abnormalities Noted Maceration: No N/A Dry/Scaly: No Periwound Skin Color: No Abnormalities Noted Atrophie Blanche: No N/A Cyanosis: No Ecchymosis: No Erythema: No Hemosiderin Staining: No Mottled: No Pallor: No Rubor: No Temperature: No Abnormality No Abnormality N/A Tenderness on Palpation: Yes Yes N/A Wound Preparation: Ulcer Cleansing: Ulcer Cleansing: N/A Rinsed/Irrigated with Saline Rinsed/Irrigated with Saline Topical Anesthetic Applied: Topical Anesthetic Applied: Other: lidocaine 4% Other: lidocaine 4% Treatment Notes Electronic Signature(s) Signed: 01/22/2018 5:07:56 PM By: Alric Quan Entered By: Alric Quan on 01/21/2018 13:58:20 Gentle, Yoel L. ([id number]) -------------------------------------------------------------------------------- [place] Details Patient Name: [name], [name] Date of Service: 01/21/2018 1:30 PM Medical Record Number: [id number] Patient Account Number: [id number] Date of Birth/Sex: 07/02/1926 (84 y.o. M) Treating RN: Ahmed Prima Primary Care Chaunda Vandergriff: Ramonita Lab Other Clinician: Referring Celisa Schoenberg: Ramonita Lab Treating Mckynlee Luse/Extender: Melburn Hake, HOYT Weeks in Treatment: 22 Active Inactive ` Abuse / Safety / Falls / Self Care Management Nursing Diagnoses: History of Falls Potential for falls Goals: Patient will not experience any injury related to falls Date Initiated: 08/19/2017 Target Resolution Date: 12/14/2017 Goal Status: Active Interventions: Assess Activities of Daily Living upon admission and as needed Assess fall risk on admission and as needed Assess: immobility, friction, shearing, incontinence upon admission and as needed Assess impairment of mobility on admission  and as needed per policy Assess personal safety and home safety (as indicated) on admission and as needed Assess self care needs on admission and as needed Notes: ` Nutrition Nursing Diagnoses: Imbalanced nutrition Potential for alteratiion in Nutrition/Potential for imbalanced nutrition Goals: Patient/caregiver agrees to and verbalizes understanding of need to use nutritional supplements and/or vitamins as prescribed Date Initiated: 08/19/2017 Target Resolution Date: 11/16/2017 Goal Status: Active Interventions: Assess patient nutrition upon admission and as needed per policy Notes: ` Orientation to the Wound Care Program Nursing Diagnoses: Aubert, Per L. ([id number]) Knowledge deficit related to the wound healing center program Goals: Patient/caregiver will verbalize understanding of the [place] Date Initiated: 08/19/2017 Target Resolution Date: 09/14/2017 Goal Status: Active Interventions: Provide education on orientation to the wound center Notes: ` Pain, Acute or Chronic Nursing Diagnoses: Pain, acute or chronic: actual or potential Potential alteration in comfort, pain Goals: Patient/caregiver will verbalize adequate pain control between visits Date Initiated: 08/19/2017 Target Resolution Date: 12/14/2017 Goal Status: Active Interventions: Complete pain assessment as per visit requirements Notes: ` Pressure Nursing Diagnoses: Knowledge deficit related to causes and risk factors for pressure ulcer development Knowledge deficit related to management of pressures ulcers Potential for impaired tissue integrity related to pressure, friction, moisture, and shear Goals: Patient will remain free from development of additional pressure ulcers Date Initiated:  08/19/2017 Target Resolution Date: 12/14/2017 Goal Status: Active Interventions: Assess: immobility, friction, shearing, incontinence upon admission and as needed Assess potential for pressure  ulcer upon admission and as needed Notes: ` Wound/Skin Impairment Nursing Diagnoses: Impaired tissue integrity Knowledge deficit related to ulceration/compromised skin integrity Deziel, Ryun L. ([id number]) Goals: Ulcer/skin breakdown will have a volume reduction of 80% by week 12 Date Initiated: 08/19/2017 Target Resolution Date: 12/14/2017 Goal Status: Active Interventions: Assess patient/caregiver ability to perform ulcer/skin care regimen upon admission and as needed Assess ulceration(s) every visit Notes: Electronic Signature(s) Signed: 01/22/2018 5:07:56 PM By: Alric Quan Entered By: Alric Quan on 01/21/2018 13:58:10 Alderman, Emery L. ([id number]) -------------------------------------------------------------------------------- Pain Assessment Details Patient Name: [name], [name] Date of Service: 01/21/2018 1:30 PM Medical Record Number: [id number] Patient Account Number: [id number] Date of Birth/Sex: 05/25/1926 (84 y.o. M) Treating RN: Montey Hora Primary Care Khari Mally: Ramonita Lab Other Clinician: Referring Ossiel Marchio: Ramonita Lab Treating Markelle Asaro/Extender: Melburn Hake, HOYT Weeks in Treatment: 22 Active Problems Location of Pain Severity and Description of Pain Patient Has Paino No Site Locations Pain Management and Medication Current Pain Management: Electronic Signature(s) Signed: 01/21/2018 5:46:04 PM By: Montey Hora Entered By: Montey Hora on 01/21/2018 13:37:20 Weniger, Imer L. ([id number]) -------------------------------------------------------------------------------- Patient/Caregiver Education Details Patient Name: [name], [name] Date of Service: 01/21/2018 1:30 PM Medical Record Number: [id number] Patient Account Number: [id number] Date of Birth/Gender: 1926-03-28 (84 y.o. M) Treating RN: Roger Shelter Primary Care Physician: Ramonita Lab Other Clinician: Referring Physician: Ramonita Lab Treating Physician/Extender: Sharalyn Ink in Treatment: 22 Education Assessment Education Provided To: Patient and Caregiver Education Topics Provided Wound Debridement: Handouts: Wound Debridement Methods: Explain/Verbal Responses: State content correctly Wound/Skin Impairment: Handouts: Caring for Your Ulcer Methods: Explain/Verbal Responses: State content correctly Electronic Signature(s) Signed: 01/21/2018 4:58:15 PM By: Roger Shelter Entered By: Roger Shelter on 01/21/2018 14:23:33 Jorgensen, Taher L. ([id number]) -------------------------------------------------------------------------------- Wound Assessment Details Patient Name: [name], [name] Date of Service: 01/21/2018 1:30 PM Medical Record Number: [id number] Patient Account Number: [id number] Date of Birth/Sex: [birth year] (84 y.o. M) Treating RN: Montey Hora Primary Care Neyda Durango: Ramonita Lab Other Clinician: Referring Izeah Vossler: Ramonita Lab Treating Vandella Ord/Extender: Melburn Hake, HOYT Weeks in Treatment: 22 Wound Status Wound Number: 1 Primary Etiology: Pressure Ulcer Wound Location: Left Calcaneus Wound Status: Open Wounding Event: Pressure Injury Comorbid Arrhythmia, Hypertension, Received History: Radiation Date Acquired: 08/12/2017 Weeks Of Treatment: 22 Clustered Wound: No Wound Measurements Length: (cm) 1.3 Width: (cm) 1.9 Depth: (cm) 0.1 Area: (cm) 1.94 Volume: (cm) 0.194 % Reduction in Area: 91.5% % Reduction in Volume: 95.8% Epithelialization: None Tunneling: No Undermining: No Wound Description Classification: Category/Stage III Wound Margin: Distinct, outline attached Exudate Amount: Large Exudate Type: Purulent Exudate Color: yellow, brown, green Foul Odor After Cleansing: Yes Due to Product Use: No Slough/Fibrino Yes Wound Bed Granulation Amount: Medium (34-66%) Exposed Structure Granulation Quality: Pink Fascia Exposed: No Necrotic Amount: Medium (34-66%) Fat Layer (Subcutaneous Tissue) Exposed:  Yes Necrotic Quality: Adherent Slough Tendon Exposed: No Muscle Exposed: No Joint Exposed: No Bone Exposed: No Periwound Skin Texture Texture Color No Abnormalities Noted: No No Abnormalities Noted: Yes Callus: No Temperature / Pain Crepitus: No Temperature: No Abnormality Excoriation: Yes Tenderness on Palpation: Yes Induration: No Rash: No Scarring: No Moisture No Abnormalities Noted: No Wound Preparation Ulcer Cleansing: Rinsed/Irrigated with Saline Kloeppel, Marquail L. ([id number]) Topical Anesthetic Applied: Other: lidocaine 4%, Treatment Notes Wound #1 (Left Calcaneus) 1. Cleansed with: Clean wound with Normal Saline 2. Anesthetic Topical Lidocaine 4% cream to wound bed prior to debridement 4.  Dressing Applied: Prisma Ag Plain packing gauze 5. Secondary Dressing Applied Bordered Foam Dressing Notes prisma dry application Electronic Signature(s) Signed: 01/21/2018 5:46:04 PM By: Montey Hora Entered By: Montey Hora on 01/21/2018 13:43:53 Crossett, Eldrick L. ([id number]) -------------------------------------------------------------------------------- Wound Assessment Details Patient Name: [name], [name] Date of Service: 01/21/2018 1:30 PM Medical Record Number: [id number] Patient Account Number: [id number] Date of Birth/Sex: 1925-11-07 (84 y.o. M) Treating RN: Montey Hora Primary Care Toinette Lackie: Ramonita Lab Other Clinician: Referring Fawn Desrocher: Ramonita Lab Treating Devanta Daniel/Extender: Melburn Hake, HOYT Weeks in Treatment: 22 Wound Status Wound Number: 2 Primary Etiology: Pressure Ulcer Wound Location: Right Calcaneus Wound Status: Open Wounding Event: Pressure Injury Comorbid Arrhythmia, Hypertension, Received History: Radiation Date Acquired: 08/12/2017 Weeks Of Treatment: 22 Clustered Wound: No Wound Measurements Length: (cm) 1.1 Width: (cm) 0.3 Depth: (cm) 0.1 Area: (cm) 0.259 Volume: (cm) 0.026 % Reduction in Area: 97.7% % Reduction in Volume:  98.9% Epithelialization: Medium (34-66%) Tunneling: No Undermining: No Wound Description Classification: Category/Stage III Wound Margin: Distinct, outline attached Exudate Amount: None Present Foul Odor After Cleansing: No Slough/Fibrino Yes Wound Bed Granulation Amount: None Present (0%) Exposed Structure Necrotic Amount: Large (67-100%) Fascia Exposed: No Necrotic Quality: Eschar Fat Layer (Subcutaneous Tissue) Exposed: Yes Tendon Exposed: No Muscle Exposed: No Joint Exposed: No Bone Exposed: No Periwound Skin Texture Texture Color No Abnormalities Noted: No No Abnormalities Noted: No Callus: No Atrophie Blanche: No Crepitus: No Cyanosis: No Excoriation: No Ecchymosis: No Induration: No Erythema: No Rash: No Hemosiderin Staining: No Scarring: No Mottled: No Pallor: No Moisture Rubor: No No Abnormalities Noted: No Dry / Scaly: No Temperature / Pain Maceration: No Temperature: No Abnormality Tenderness on Palpation: Yes Wound Preparation [name], [name] ([id number]) Ulcer Cleansing: Rinsed/Irrigated with Saline Topical Anesthetic Applied: Other: lidocaine 4%, Treatment Notes Wound #2 (Right Calcaneus) 1. Cleansed with: Clean wound with Normal Saline 2. Anesthetic Topical Lidocaine 4% cream to wound bed prior to debridement 4. Dressing Applied: Hydrogel Prisma Ag 5. Secondary Dressing Applied Bordered Foam Dressing Electronic Signature(s) Signed: 01/21/2018 5:46:04 PM By: Montey Hora Entered By: Montey Hora on 01/21/2018 13:44:13 [name], [name] ([id number]) -------------------------------------------------------------------------------- Vitals Details Patient Name: [name], [name] Date of Service: 01/21/2018 1:30 PM Medical Record Number: [id number] Patient Account Number: [id number] Date of Birth/Sex: 1926/05/28 (84 y.o. M) Treating RN: Montey Hora Primary Care Regla Fitzgibbon: Ramonita Lab Other Clinician: Referring Maddock Finigan: Ramonita Lab Treating Kyrie Bun/Extender: Melburn Hake, HOYT Weeks in Treatment: 22 Vital Signs Time Taken: 13:37 Temperature (F): 97.9 Height (in): 70 Pulse (bpm): 75 Weight (lbs): 190 Respiratory Rate (breaths/min): 16 Body Mass Index (BMI): 27.3 Blood Pressure (mmHg): 106/65 Reference Range: 80 - 120 mg / dl Electronic Signature(s) Signed: 01/21/2018 5:46:04 PM By: Montey Hora Entered By: Montey Hora on 01/21/2018 13:37:35

## 2018-01-23 NOTE — Progress Notes (Signed)
Hench, Damarea L. ([id number]) Visit Report for 01/21/2018 Chief Complaint Document Details Patient Name: [name], [name] Date of Service: 01/21/2018 1:30 PM Medical Record Number: [id number] Patient Account Number: [id number] Date of Birth/Sex: 03-22-1926 (84 y.o. M) Treating RN: Ahmed Prima Primary Care Provider: Ramonita Lab Other Clinician: Referring Provider: Ramonita Lab Treating Provider/Extender: Melburn Hake, Khaliya Golinski Weeks in Treatment: 22 Information Obtained from: Patient Chief Complaint Bilateral Heel pressure ulcers and bilateral great toe DTIs Electronic Signature(s) Signed: 01/22/2018 1:39:21 PM By: Worthy Keeler PA-C Entered By: Worthy Keeler on 01/21/2018 14:14:59 Norbeck, Xavyer L. ([id number]) -------------------------------------------------------------------------------- Debridement Details Patient Name: MOTTA, Ziggy L. Date of Service: 01/21/2018 1:30 PM Medical Record Number: [id number] Patient Account Number: [id number] Date of Birth/Sex: 1926/03/05 (84 y.o. M) Treating RN: Ahmed Prima Primary Care Provider: Ramonita Lab Other Clinician: Referring Provider: Ramonita Lab Treating Provider/Extender: Melburn Hake, Nalu Troublefield Weeks in Treatment: 22 Debridement Performed for Wound #2 Right Calcaneus Assessment: Performed By: Physician STONE III, Ayme Short E., PA-C Debridement Type: Debridement Pre-procedure Verification/Time Yes - 14:01 Out Taken: Start Time: 14:01 Pain Control: Lidocaine 4% Topical Solution Total Area Debrided (L x W): 1.1 (cm) x 0.3 (cm) = 0.33 (cm) Tissue and other material Viable, Non-Viable, Slough, Subcutaneous, Fibrin/Exudate, Slough debrided: Level: Skin/Subcutaneous Tissue Debridement Description: Excisional Instrument: Curette Bleeding: Minimum Hemostasis Achieved: Pressure End Time: 14:04 Procedural Pain: 0 Post Procedural Pain: 0 Response to Treatment: Procedure was tolerated well Level of Consciousness: Awake and Alert Post  Debridement Measurements of Total Wound Length: (cm) 0.2 Stage: Category/Stage III Width: (cm) 0.2 Depth: (cm) 0.2 Volume: (cm) 0.006 Character of Wound/Ulcer Post Requires Further Debridement Debridement: Post Procedure Diagnosis Same as Pre-procedure Electronic Signature(s) Signed: 01/22/2018 1:39:21 PM By: Worthy Keeler PA-C Signed: 01/22/2018 5:07:56 PM By: Alric Quan Entered By: Alric Quan on 01/21/2018 14:04:16 [place] ([id number]) -------------------------------------------------------------------------------- Debridement Details Patient Name: MANGANO, Chadrick L. Date of Service: 01/21/2018 1:30 PM Medical Record Number: [id number] Patient Account Number: [id number] Date of Birth/Sex: Dec 11, 1925 (84 y.o. M) Treating RN: Ahmed Prima Primary Care Provider: Ramonita Lab Other Clinician: Referring Provider: Ramonita Lab Treating Provider/Extender: Melburn Hake, Pacen Watford Weeks in Treatment: 22 Debridement Performed for Wound #1 Left Calcaneus Assessment: Performed By: Physician STONE III, Sanora Cunanan E., PA-C Debridement Type: Debridement Pre-procedure Verification/Time Yes - 14:01 Out Taken: Start Time: 14:05 Pain Control: Lidocaine 4% Topical Solution Total Area Debrided (L x W): 1.3 (cm) x 1.9 (cm) = 2.47 (cm) Tissue and other material Viable, Non-Viable, Slough, Subcutaneous, Fibrin/Exudate, Slough debrided: Level: Skin/Subcutaneous Tissue Debridement Description: Excisional Instrument: Curette Bleeding: Minimum Hemostasis Achieved: Pressure End Time: 14:10 Procedural Pain: 0 Post Procedural Pain: 0 Response to Treatment: Procedure was tolerated well Level of Consciousness: Awake and Alert Post Debridement Measurements of Total Wound Length: (cm) 1.3 Stage: Category/Stage III Width: (cm) 1.9 Depth: (cm) 1 Volume: (cm) 1.94 Character of Wound/Ulcer Post Requires Further Debridement Debridement: Post Procedure Diagnosis Same as  Pre-procedure Electronic Signature(s) Signed: 01/22/2018 1:39:21 PM By: Worthy Keeler PA-C Signed: 01/22/2018 5:07:56 PM By: Alric Quan Entered By: Alric Quan on 01/21/2018 [place] ([id number]) -------------------------------------------------------------------------------- HPI Details Patient Name: SKILLMAN, Richerd L. Date of Service: 01/21/2018 1:30 PM Medical Record Number: [id number] Patient Account Number: [id number] Date of Birth/Sex: 1926-03-13 (84 y.o. M) Treating RN: Ahmed Prima Primary Care Provider: Ramonita Lab Other Clinician: Referring Provider: Ramonita Lab Treating Provider/Extender: Melburn Hake, Tashika Goodin Weeks in Treatment: 22 History of Present Illness Associated Signs and Symptoms: Patient has a history of chronic atrophic relation, hypertension, and long-term  use of anticoagulants. He also had a fall with pelvic fracture which occurred on January 2019. HPI Description: 08/19/17 patient presents today for initial evaluation and our clinic concerning issues he has been having with the bilateral heels as well as the lateral great toe was since he was admitted to the nursing facility following a pelvic fracture. Initially he was spending a significant amount of time in the bed although he is now up more often since the healing process has progressed along the way obviously. When he is up he's spending most of the time sitting in a wheelchair at this point. When he is in bed he lays on his back and the covers/sheets are pushing down on his toes bilaterally which I think it may be what's causing the issues with his deep tissue injury to the bilateral great toes. With that being said the hills also appear to be pressure in nature and I think that laying in the bed getting pressure to the sites is what has led to the formation of these on stage will pressure ulcers. Fortunately there does not appear to be any infection in both areas of ulceration of the heel  appear to be stable at this point. Patient does not have any significant pain at the site she does have some of the eschar along the edges which is starting to lift up and come all hopefully slowly but surely this will occur. Patient did have arterial studies performed April 2018 which showed a normal TBI on the right knee slightly depressed TBI on the left but this does not appear to have changed significantly at that point. Specifically this was 0.75 on the right and 0.61 on the left. 09/13/17 on evaluation today patient presents for a follow-up evaluation regarding his bilateral heal ulcers. He has been tolerating the dressing changes fairly well although he did have some bleeding earlier today when it appears some of the skin on the surrounding portion of the wound got pulled where this is starting to flake off. Subsequently there does not appear to be any bleeding right now. Since I last saw him he has been discharged from the facility and is now back at home. His wife is having a very difficult time being able to get him up and in the car in order to come for an appointment which is why they missed the last appointment have not been able to come back sooner. Fortunately the he does not have any signs of infection. No fevers chills noted. He does have difficulty walking due to pain in the bilateral heels. 10/11/17 on evaluation today patient presents for follow-up evaluation concerning his ongoing bilateral heal ulcers. He has been tolerating the dressing changes which were Betadine paints daily without complication. I have not seen him since September 13, 2017. Pressure that I saw him August 19, 2017. Subsequently those are the only prior visits before today. His left heel actually seems to have a significant amount of eschar that is starting to lift up at this point and seems to be a little more loose as far as drainage building up underneath the eschar. The right eschar though there are some parts  lifting up seems to still be fairly stable which is good news. His toe ulcer is almost completely healed a lot of that eschar came off and it looks very well u 01/06/18 on evaluation today patient actually appears to be doing excellent in regard to his bilateral heal ulcers. The right heel is very  close to completely closing and overall this is doing excellent. Left heel #2 great improvement as it has been for the past several weeks. I'm extremely pleased in this regard. Overall the patient is also happy to his wife seems to be doing an excellent job caring for him in my pinion. 01/14/18 on evaluation today patient actually appears to be doing better in regard to both heal ulcers. The right heel is almost completely healed there was some callous buildup around the edge of the wound but overall I feel like things are progressing quite nicely. In regard to the left heel also feel like there's good epithelialization however this has been obviously somewhat slower it was also much larger initially when the eschar was removed so again I'm not really too worried about it I think it's progressing in a good fashion. Overall I feel like the patient is doing excellent. 01/21/18 on evaluation today patient appears to actually be doing rather well in regard to his lower extremity heels. His right lower extremity actually was almost completely healed although there was a very small opening still remaining that was noted during debridement today. Nonetheless overall I feel like he is making good progress. No fevers, chills, nausea, or vomiting noted at this time. Molock, Markis L. ([id number]) Electronic Signature(s) Signed: 01/22/2018 1:39:21 PM By: Worthy Keeler PA-C Entered By: Worthy Keeler on 01/22/2018 13:35:31 Loftin, Rockland L. ([id number]) -------------------------------------------------------------------------------- Physical Exam Details Patient Name: Knecht, Reg L. Date of Service: 01/21/2018 1:30  PM Medical Record Number: [id number] Patient Account Number: [id number] Date of Birth/Sex: Sep 04, 1925 (84 y.o. M) Treating RN: Ahmed Prima Primary Care Provider: Ramonita Lab Other Clinician: Referring Provider: Ramonita Lab Treating Provider/Extender: STONE III, Niharika Savino Weeks in Treatment: 28 Constitutional Well-nourished and well-hydrated in no acute distress. Respiratory normal breathing without difficulty. Psychiatric this patient is able to make decisions and demonstrates good insight into disease process. Alert and Oriented x 3. pleasant and cooperative. Notes On evaluation at this point patient's wounds seem to be showing signs of improvement he still has some undermining on the plantar aspect of the left heel which I think we may need to pack Prisma into and possibly add a small packing strip in order to help hold it in place and keep fluid from collecting in this region. Patient's wife was shown how to do this she is the one taking care of him at this point. Electronic Signature(s) Signed: 01/22/2018 1:39:21 PM By: Worthy Keeler PA-C Entered By: Worthy Keeler on 01/22/2018 13:36:15 Matsunaga, Kharee L. ([id number]) -------------------------------------------------------------------------------- Physician Orders Details Patient Name: MURVIN, GIFT L. Date of Service: 01/21/2018 1:30 PM Medical Record Number: [id number] Patient Account Number: [id number] Date of Birth/Sex: 1926-03-18 (84 y.o. M) Treating RN: Ahmed Prima Primary Care Provider: Ramonita Lab Other Clinician: Referring Provider: Ramonita Lab Treating Provider/Extender: Melburn Hake, Valentine Barney Weeks in Treatment: 22 Verbal / Phone Orders: Yes Clinician: Carolyne Fiscal, Debi Read Back and Verified: Yes Diagnosis Coding Wound Cleansing Wound #1 Left Calcaneus o Clean wound with Normal Saline. o Cleanse wound with mild soap and water o May Shower, gently pat wound dry prior to applying new dressing. Wound #2 Right  Calcaneus o Clean wound with Normal Saline. o Cleanse wound with mild soap and water o May Shower, gently pat wound dry prior to applying new dressing. Anesthetic (add to Medication List) Wound #1 Left Calcaneus o Topical Lidocaine 4% cream applied to wound bed prior to debridement (In Clinic Only). Wound #2 Right Calcaneus o Topical Lidocaine  4% cream applied to wound bed prior to debridement (In Clinic Only). Skin Barriers/Peri-Wound Care Wound #1 Left Calcaneus o Skin Prep Wound #2 Right Calcaneus o Skin Prep Primary Wound Dressing Wound #1 Left Calcaneus o Silver Collagen - place in wound dry o Plain packing gauze Wound #2 Right Calcaneus o Hydrogel - or [place] Jelly o Silver Collagen Secondary Dressing Wound #1 Left Calcaneus o Boardered Foam Dressing Wound #2 Right Calcaneus o Boardered Foam Dressing Dressing Change Frequency Wound #1 Left Calcaneus Barse, Calahan L. ([id number]) o Change dressing every other day. Wound #2 Right Calcaneus o Change dressing every other day. Follow-up Appointments Wound #1 Left Calcaneus o Return Appointment in 1 week. Wound #2 Right Calcaneus o Return Appointment in 1 week. Off-Loading Wound #1 Left Calcaneus o Turn and reposition every 2 hours Wound #2 Right Calcaneus o Turn and reposition every 2 hours Additional Orders / Instructions Wound #1 Left Calcaneus o Increase protein intake. Wound #2 Right Calcaneus o Increase protein intake. Patient Medications Allergies: No Known Allergies Notifications Medication Indication Start End lidocaine DOSE 1 - topical 4 % cream - 1 cream topical Electronic Signature(s) Signed: 01/22/2018 1:39:21 PM By: Worthy Keeler PA-C Signed: 01/22/2018 5:07:56 PM By: Alric Quan Entered By: Alric Quan on 01/21/2018 14:13:08 Moustafa, [place] ([id number]) -------------------------------------------------------------------------------- Prescription  01/21/2018 Patient Name: JULEN, RUBERT L. Provider: Worthy Keeler PA-C Date of Birth: 03-15-1926 NPI#: [id number] Sex: Jerilynn Mages DEA#: [id number] Phone #: [phone] License #: Patient Address: [facility] [place], [place] [address] [address], [place],  [address] [phone] Allergies No Known Allergies Medication Medication: Route: Strength: Form: lidocaine 4 % topical cream topical 4% cream Class: TOPICAL LOCAL ANESTHETICS Dose: Frequency / Time: Indication: 1 1 cream topical Number of Refills: Number of Units: 0 Generic Substitution: Start Date: End Date: One Time Use: Substitution Permitted No Note to Pharmacy: Signature(s): Date(s): Electronic Signature(s) Signed: 01/22/2018 1:39:21 PM By: Worthy Keeler PA-C Signed: 01/22/2018 5:07:56 PM By: Alric Quan Entered By: Alric Quan on 01/21/2018 14:13:08 [place], [place] ([id number]) --------------------------------------------------------------------------------  Problem List Details Patient Name: WEARE, Morrie L. Date of Service: 01/21/2018 1:30 PM Medical Record Number: [id number] Patient Account Number: [id number] Date of Birth/Sex: [birth year] (84 y.o. M) Treating RN: Ahmed Prima Primary Care Provider: Ramonita Lab Other Clinician: Referring Provider: Ramonita Lab Treating Provider/Extender: Melburn Hake, Baily Serpe Weeks in Treatment: 22 Active Problems ICD-10 Evaluated Encounter Code Description Active Date Today Diagnosis L89.623 Pressure ulcer of left heel, stage 3 08/19/2017 No Yes L89.613 Pressure ulcer of right heel, stage 3 08/19/2017 No Yes I48.2 Chronic atrial fibrillation 08/19/2017 No Yes I10 Essential (primary) hypertension 08/19/2017 No Yes Z79.01 Long term (current) use of anticoagulants 08/19/2017 No Yes Inactive Problems Resolved Problems Electronic Signature(s) Signed: 01/22/2018 1:39:21 PM  By: Worthy Keeler PA-C Entered By: Worthy Keeler on 01/21/2018 14:14:53 Bitting, Clayson L. ([id number]) -------------------------------------------------------------------------------- Progress Note Details Patient Name: Stanko, Alik L. Date of Service: 01/21/2018 1:30 PM Medical Record Number: [id number] Patient Account Number: [id number] Date of Birth/Sex: June 29, 1926 (84 y.o. M) Treating RN: Ahmed Prima Primary Care Provider: Ramonita Lab Other Clinician: Referring Provider: Ramonita Lab Treating Provider/Extender: Melburn Hake, Shmiel Morton Weeks in Treatment: 22 Subjective Chief Complaint Information obtained from Patient Bilateral Heel pressure ulcers and bilateral great toe DTIs History of Present Illness (HPI) The following HPI elements were documented for the patient's wound: Associated Signs and Symptoms: Patient has a history of chronic atrophic relation,  hypertension, and long-term use of anticoagulants. He also had a fall with pelvic fracture which occurred on January 2019. 08/19/17 patient presents today for initial evaluation and our clinic concerning issues he has been having with the bilateral heels as well as the lateral great toe was since he was admitted to the nursing facility following a pelvic fracture. Initially he was spending a significant amount of time in the bed although he is now up more often since the healing process has progressed along the way obviously. When he is up he's spending most of the time sitting in a wheelchair at this point. When he is in bed he lays on his back and the covers/sheets are pushing down on his toes bilaterally which I think it may be what's causing the issues with his deep tissue injury to the bilateral great toes. With that being said the hills also appear to be pressure in nature and I think that laying in the bed getting pressure to the sites is what has led to the formation of these on stage will pressure ulcers. Fortunately there does  not appear to be any infection in both areas of ulceration of the heel appear to be stable at this point. Patient does not have any significant pain at the site she does have some of the eschar along the edges which is starting to lift up and come all hopefully slowly but surely this will occur. Patient did have arterial studies performed April 2018 which showed a normal TBI on the right knee slightly depressed TBI on the left but this does not appear to have changed significantly at that point. Specifically this was 0.75 on the right and 0.61 on the left. 09/13/17 on evaluation today patient presents for a follow-up evaluation regarding his bilateral heal ulcers. He has been tolerating the dressing changes fairly well although he did have some bleeding earlier today when it appears some of the skin on the surrounding portion of the wound got pulled where this is starting to flake off. Subsequently there does not appear to be any bleeding right now. Since I last saw him he has been discharged from the facility and is now back at home. His wife is having a very difficult time being able to get him up and in the car in order to come for an appointment which is why they missed the last appointment have not been able to come back sooner. Fortunately the he does not have any signs of infection. No fevers chills noted. He does have difficulty walking due to pain in the bilateral heels. 10/11/17 on evaluation today patient presents for follow-up evaluation concerning his ongoing bilateral heal ulcers. He has been tolerating the dressing changes which were Betadine paints daily without complication. I have not seen him since September 13, 2017. Pressure that I saw him August 19, 2017. Subsequently those are the only prior visits before today. His left heel actually seems to have a significant amount of eschar that is starting to lift up at this point and seems to be a little more loose as far as drainage building  up underneath the eschar. The right eschar though there are some parts lifting up seems to still be fairly stable which is good news. His toe ulcer is almost completely healed a lot of that eschar came off and it looks very well u 01/06/18 on evaluation today patient actually appears to be doing excellent in regard to his bilateral heal ulcers. The right heel is  very close to completely closing and overall this is doing excellent. Left heel #2 great improvement as it has been for the past several weeks. I'm extremely pleased in this regard. Overall the patient is also happy to his wife seems to be doing an excellent job caring for him in my pinion. 01/14/18 on evaluation today patient actually appears to be doing better in regard to both heal ulcers. The right heel is almost completely healed there was some callous buildup around the edge of the wound but overall I feel like things are progressing Salser, Ramey ([id number]) quite nicely. In regard to the left heel also feel like there's good epithelialization however this has been obviously somewhat slower it was also much larger initially when the eschar was removed so again I'm not really too worried about it I think it's progressing in a good fashion. Overall I feel like the patient is doing excellent. 01/21/18 on evaluation today patient appears to actually be doing rather well in regard to his lower extremity heels. His right lower extremity actually was almost completely healed although there was a very small opening still remaining that was noted during debridement today. Nonetheless overall I feel like he is making good progress. No fevers, chills, nausea, or vomiting noted at this time. Patient History Information obtained from Patient. Family History No family history of Cancer, Diabetes, Heart Disease, Hereditary Spherocytosis, Hypertension, Kidney Disease, Lung Disease, Seizures, Stroke, Thyroid Problems, Tuberculosis. Social  History Former smoker, Marital Status - Married, Alcohol Use - Rarely - 2oz day, Drug Use - No History, Caffeine Use - Daily. Review of Systems (ROS) Constitutional Symptoms (General Health) Denies complaints or symptoms of Fever, Chills. Respiratory The patient has no complaints or symptoms. Cardiovascular The patient has no complaints or symptoms. Psychiatric The patient has no complaints or symptoms. Objective Constitutional Well-nourished and well-hydrated in no acute distress. Vitals Time Taken: 1:37 PM, Height: 70 in, Weight: 190 lbs, BMI: 27.3, Temperature: 97.9 F, Pulse: 75 bpm, Respiratory Rate: 16 breaths/min, Blood Pressure: 106/65 mmHg. Respiratory normal breathing without difficulty. Psychiatric this patient is able to make decisions and demonstrates good insight into disease process. Alert and Oriented x 3. pleasant and cooperative. General Notes: On evaluation at this point patient's wounds seem to be showing signs of improvement he still has some undermining on the plantar aspect of the left heel which I think we may need to pack Prisma into and possibly add a small packing strip in order to help hold it in place and keep fluid from collecting in this region. Patient's wife was shown how to do this she is the one taking care of him at this point. Bodey, Ganon L. ([id number]) Integumentary (Hair, Skin) Wound #1 status is Open. Original cause of wound was Pressure Injury. The wound is located on the Left Calcaneus. The wound measures 1.3cm length x 1.9cm width x 0.1cm depth; 1.94cm^2 area and 0.194cm^3 volume. There is Fat Layer (Subcutaneous Tissue) Exposed exposed. There is no tunneling or undermining noted. There is a large amount of purulent drainage noted. Foul odor after cleansing was noted. The wound margin is distinct with the outline attached to the wound base. There is medium (34-66%) pink granulation within the wound bed. There is a medium (34-66%) amount of  necrotic tissue within the wound bed including Adherent Slough. The periwound skin appearance had no abnormalities noted for color. The periwound skin appearance exhibited: Excoriation. The periwound skin appearance did not exhibit: Callus, Crepitus, Induration, Rash, Scarring. Periwound  temperature was noted as No Abnormality. The periwound has tenderness on palpation. Wound #2 status is Open. Original cause of wound was Pressure Injury. The wound is located on the Right Calcaneus. The wound measures 1.1cm length x 0.3cm width x 0.1cm depth; 0.259cm^2 area and 0.026cm^3 volume. There is Fat Layer (Subcutaneous Tissue) Exposed exposed. There is no tunneling or undermining noted. There is a none present amount of drainage noted. The wound margin is distinct with the outline attached to the wound base. There is no granulation within the wound bed. There is a large (67-100%) amount of necrotic tissue within the wound bed including Eschar. The periwound skin appearance did not exhibit: Callus, Crepitus, Excoriation, Induration, Rash, Scarring, Dry/Scaly, Maceration, Atrophie Blanche, Cyanosis, Ecchymosis, Hemosiderin Staining, Mottled, Pallor, Rubor, Erythema. Periwound temperature was noted as No Abnormality. The periwound has tenderness on palpation. Assessment Active Problems ICD-10 Pressure ulcer of left heel, stage 3 Pressure ulcer of right heel, stage 3 Chronic atrial fibrillation Essential (primary) hypertension Long term (current) use of anticoagulants Procedures Wound #1 Pre-procedure diagnosis of Wound #1 is a Pressure Ulcer located on the Left Calcaneus . There was a Excisional Skin/Subcutaneous Tissue Debridement with a total area of 2.47 sq cm performed by STONE III, Denina Rieger E., PA-C. With the following instrument(s): Curette to remove Viable and Non-Viable tissue/material. Material removed includes Subcutaneous Tissue, Slough, and Fibrin/Exudate after achieving pain control using  Lidocaine 4% Topical Solution. No specimens were taken. A time out was conducted at 14:01, prior to the start of the procedure. A Minimum amount of bleeding was controlled with Pressure. The procedure was tolerated well with a pain level of 0 throughout and a pain level of 0 following the procedure. Patient s Level of Consciousness post procedure was recorded as Awake and Alert. Post Debridement Measurements: 1.3cm length x 1.9cm width x 1cm depth; 1.94cm^3 volume. Post debridement Stage noted as Category/Stage III. Character of Wound/Ulcer Post Debridement requires further debridement. Post procedure Diagnosis Wound #1: Same as Pre-Procedure Wound #2 Pre-procedure diagnosis of Wound #2 is a Pressure Ulcer located on the Right Calcaneus . There was a Excisional [place], [place] ([id number]) Skin/Subcutaneous Tissue Debridement with a total area of 0.33 sq cm performed by STONE III, Neveyah Garzon E., PA-C. With the following instrument(s): Curette to remove Viable and Non-Viable tissue/material. Material removed includes Subcutaneous Tissue, Slough, and Fibrin/Exudate after achieving pain control using Lidocaine 4% Topical Solution. No specimens were taken. A time out was conducted at 14:01, prior to the start of the procedure. A Minimum amount of bleeding was controlled with Pressure. The procedure was tolerated well with a pain level of 0 throughout and a pain level of 0 following the procedure. Patient s Level of Consciousness post procedure was recorded as Awake and Alert. Post Debridement Measurements: 0.2cm length x 0.2cm width x 0.2cm depth; 0.006cm^3 volume. Post debridement Stage noted as Category/Stage III. Character of Wound/Ulcer Post Debridement requires further debridement. Post procedure Diagnosis Wound #2: Same as Pre-Procedure Plan Wound Cleansing: Wound #1 Left Calcaneus: Clean wound with Normal Saline. Cleanse wound with mild soap and water May Shower, gently pat wound dry prior to  applying new dressing. Wound #2 Right Calcaneus: Clean wound with Normal Saline. Cleanse wound with mild soap and water May Shower, gently pat wound dry prior to applying new dressing. Anesthetic (add to Medication List): Wound #1 Left Calcaneus: Topical Lidocaine 4% cream applied to wound bed prior to debridement (In Clinic Only). Wound #2 Right Calcaneus: Topical Lidocaine 4% cream applied  to wound bed prior to debridement (In Clinic Only). Skin Barriers/Peri-Wound Care: Wound #1 Left Calcaneus: Skin Prep Wound #2 Right Calcaneus: Skin Prep Primary Wound Dressing: Wound #1 Left Calcaneus: Silver Collagen - place in wound dry Plain packing gauze Wound #2 Right Calcaneus: Hydrogel - or [place] Jelly Silver Collagen Secondary Dressing: Wound #1 Left Calcaneus: Boardered Foam Dressing Wound #2 Right Calcaneus: Boardered Foam Dressing Dressing Change Frequency: Wound #1 Left Calcaneus: Change dressing every other day. Wound #2 Right Calcaneus: Change dressing every other day. Follow-up Appointments: Wound #1 Left Calcaneus: Return Appointment in 1 week. Wound #2 Right Calcaneus: Return Appointment in 1 week. Reth, Khair L. ([id number]) Off-Loading: Wound #1 Left Calcaneus: Turn and reposition every 2 hours Wound #2 Right Calcaneus: Turn and reposition every 2 hours Additional Orders / Instructions: Wound #1 Left Calcaneus: Increase protein intake. Wound #2 Right Calcaneus: Increase protein intake. The following medication(s) was prescribed: lidocaine topical 4 % cream 1 1 cream topical was prescribed at facility I'm in a recommend that we actually continue with the Current wound care measures for the next week. The patient is in agreement with the plan as is his wife. Please see above. I will see him back for follow-up. Please see above for specific wound care orders. We will see patient for re-evaluation in 1 week(s) here in the clinic. If anything worsens or changes  patient will contact our office for additional recommendations. Electronic Signature(s) Signed: 01/22/2018 1:39:21 PM By: Worthy Keeler PA-C Entered By: Worthy Keeler on 01/22/2018 13:36:30 Siegel, Rion L. ([id number]) -------------------------------------------------------------------------------- ROS/PFSH Details Patient Name: BEAGLEY, Gedalya L. Date of Service: 01/21/2018 1:30 PM Medical Record Number: [id number] Patient Account Number: [id number] Date of Birth/Sex: 08-11-1925 (84 y.o. M) Treating RN: Ahmed Prima Primary Care Provider: Ramonita Lab Other Clinician: Referring Provider: Ramonita Lab Treating Provider/Extender: Melburn Hake, Tivis Wherry Weeks in Treatment: 22 Information Obtained From Patient Wound History Do you currently have one or more open woundso Yes How many open wounds do you currently haveo 2 Approximately how long have you had your woundso 1 week How have you been treating your wound(s) until nowo nothing Has your wound(s) ever healed and then re-openedo No Have you had any lab work done in the past montho No Have you tested positive for osteomyelitis (bone infection)o No Have you had any tests for circulation on your legso No Have you had other problems associated with your woundso Swelling Constitutional Symptoms (General Health) Complaints and Symptoms: Negative for: Fever; Chills Respiratory Complaints and Symptoms: No Complaints or Symptoms Cardiovascular Complaints and Symptoms: No Complaints or Symptoms Medical History: Positive for: Arrhythmia - a-fib; Hypertension Oncologic Medical History: Positive for: Received Radiation Psychiatric Complaints and Symptoms: No Complaints or Symptoms Immunizations Pneumococcal Vaccine: Received Pneumococcal Vaccination: Yes Implantable Devices Family and Social History Palencia, Anuj L. ([id number]) Cancer: No; Diabetes: No; Heart Disease: No; Hereditary Spherocytosis: No; Hypertension: No; Kidney Disease:  No; Lung Disease: No; Seizures: No; Stroke: No; Thyroid Problems: No; Tuberculosis: No; Former smoker; Marital Status - Married; Alcohol Use: Rarely - 2oz day; Drug Use: No History; Caffeine Use: Daily; Financial Concerns: No; Food, Clothing or Shelter Needs: No; Support System Lacking: No; Transportation Concerns: No; Advanced Directives: Yes (Not Provided); Patient does not want information on Advanced Directives; Do not resuscitate: No; Living Will: Yes (Not Provided); Medical Power of Attorney: No Physician Affirmation I have reviewed and agree with the above information. Electronic Signature(s) Signed: 01/22/2018 1:39:21 PM By: Worthy Keeler PA-C Signed: 01/22/2018 5:07:56 PM  By: Alric Quan Entered By: Worthy Keeler on 01/22/2018 13:35:53 Cloward, Beren L. ([id number]) -------------------------------------------------------------------------------- SuperBill Details Patient Name: IVERSEN, Patterson L. Date of Service: 01/21/2018 Medical Record Number: [id number] Patient Account Number: [id number] Date of Birth/Sex: 03-09-1926 (84 y.o. M) Treating RN: Ahmed Prima Primary Care Provider: Ramonita Lab Other Clinician: Referring Provider: Ramonita Lab Treating Provider/Extender: Melburn Hake, [place] Weeks in Treatment: 22 Diagnosis Coding ICD-10 Codes Code Description [phone] Pressure ulcer of left heel, stage 3 L89.613 Pressure ulcer of right heel, stage 3 I48.2 Chronic atrial fibrillation I10 Essential (primary) hypertension Z79.01 Long term (current) use of anticoagulants Facility Procedures CPT4 Code: [id number] Description: 82993 - DEB SUBQ TISSUE 20 SQ CM/< ICD-10 Diagnosis Description L89.623 Pressure ulcer of left heel, stage 3 L89.613 Pressure ulcer of right heel, stage 3 Modifier: Quantity: 1 Physician Procedures CPT4 Code: [id number] Description: 11042 - WC PHYS SUBQ TISS 20 SQ CM ICD-10 Diagnosis Description L89.623 Pressure ulcer of left heel, stage 3 L89.613 Pressure ulcer  of right heel, stage 3 Modifier: Quantity: 1 Electronic Signature(s) Signed: 01/22/2018 1:39:21 PM By: Worthy Keeler PA-C Entered By: Worthy Keeler on 01/22/2018 13:36:52

## 2018-01-27 DIAGNOSIS — I739 Peripheral vascular disease, unspecified: Secondary | ICD-10-CM | POA: Diagnosis not present

## 2018-01-27 DIAGNOSIS — I482 Chronic atrial fibrillation: Secondary | ICD-10-CM | POA: Diagnosis not present

## 2018-01-27 DIAGNOSIS — I10 Essential (primary) hypertension: Secondary | ICD-10-CM | POA: Diagnosis not present

## 2018-01-27 DIAGNOSIS — D692 Other nonthrombocytopenic purpura: Secondary | ICD-10-CM | POA: Diagnosis not present

## 2018-01-28 ENCOUNTER — Encounter: Payer: PPO | Admitting: Physician Assistant

## 2018-01-28 DIAGNOSIS — L89623 Pressure ulcer of left heel, stage 3: Secondary | ICD-10-CM | POA: Diagnosis not present

## 2018-01-28 DIAGNOSIS — L89613 Pressure ulcer of right heel, stage 3: Secondary | ICD-10-CM | POA: Diagnosis not present

## 2018-02-03 DIAGNOSIS — L89893 Pressure ulcer of other site, stage 3: Secondary | ICD-10-CM | POA: Diagnosis not present

## 2018-02-03 DIAGNOSIS — I739 Peripheral vascular disease, unspecified: Secondary | ICD-10-CM | POA: Diagnosis not present

## 2018-02-03 DIAGNOSIS — I482 Chronic atrial fibrillation: Secondary | ICD-10-CM | POA: Diagnosis not present

## 2018-02-03 DIAGNOSIS — K219 Gastro-esophageal reflux disease without esophagitis: Secondary | ICD-10-CM | POA: Diagnosis not present

## 2018-02-03 DIAGNOSIS — I10 Essential (primary) hypertension: Secondary | ICD-10-CM | POA: Diagnosis not present

## 2018-02-03 DIAGNOSIS — D519 Vitamin B12 deficiency anemia, unspecified: Secondary | ICD-10-CM | POA: Diagnosis not present

## 2018-02-03 DIAGNOSIS — I700 Atherosclerosis of aorta: Secondary | ICD-10-CM | POA: Diagnosis not present

## 2018-02-03 DIAGNOSIS — Z7901 Long term (current) use of anticoagulants: Secondary | ICD-10-CM | POA: Diagnosis not present

## 2018-02-03 DIAGNOSIS — I2510 Atherosclerotic heart disease of native coronary artery without angina pectoris: Secondary | ICD-10-CM | POA: Diagnosis not present

## 2018-02-03 DIAGNOSIS — E785 Hyperlipidemia, unspecified: Secondary | ICD-10-CM | POA: Diagnosis not present

## 2018-02-03 DIAGNOSIS — M8000XD Age-related osteoporosis with current pathological fracture, unspecified site, subsequent encounter for fracture with routine healing: Secondary | ICD-10-CM | POA: Diagnosis not present

## 2018-02-03 DIAGNOSIS — D692 Other nonthrombocytopenic purpura: Secondary | ICD-10-CM | POA: Diagnosis not present

## 2018-02-04 ENCOUNTER — Encounter: Payer: PPO | Admitting: Physician Assistant

## 2018-02-04 DIAGNOSIS — L89623 Pressure ulcer of left heel, stage 3: Secondary | ICD-10-CM | POA: Diagnosis not present

## 2018-02-11 ENCOUNTER — Encounter: Payer: PPO | Attending: Nurse Practitioner | Admitting: Nurse Practitioner

## 2018-02-11 DIAGNOSIS — Z87891 Personal history of nicotine dependence: Secondary | ICD-10-CM | POA: Diagnosis not present

## 2018-02-11 DIAGNOSIS — I482 Chronic atrial fibrillation: Secondary | ICD-10-CM | POA: Diagnosis not present

## 2018-02-11 DIAGNOSIS — Z923 Personal history of irradiation: Secondary | ICD-10-CM | POA: Diagnosis not present

## 2018-02-11 DIAGNOSIS — Z7901 Long term (current) use of anticoagulants: Secondary | ICD-10-CM | POA: Insufficient documentation

## 2018-02-11 DIAGNOSIS — I10 Essential (primary) hypertension: Secondary | ICD-10-CM | POA: Diagnosis not present

## 2018-02-11 DIAGNOSIS — R791 Abnormal coagulation profile: Secondary | ICD-10-CM | POA: Diagnosis not present

## 2018-02-11 DIAGNOSIS — L89623 Pressure ulcer of left heel, stage 3: Secondary | ICD-10-CM | POA: Insufficient documentation

## 2018-02-16 NOTE — Progress Notes (Signed)
Swearengin, Daquann L. ([id number]) Visit Report for 02/04/2018 Arrival Information Details Patient Name: [name], [name] Date of Service: 02/04/2018 2:30 PM Medical Record Number: [id number] Patient Account Number: [id number] Date of Birth/Sex: [birth year] (84 y.o. M) Treating RN: Montey Hora Primary Care Valerio Pinard: Ramonita Lab Other Clinician: Referring Hubbard Seldon: Ramonita Lab Treating Kunta Hilleary/Extender: Melburn Hake, HOYT Weeks in Treatment: 24 Visit Information History Since Last Visit Added or deleted any medications: No Patient Arrived: Walker Any new allergies or adverse reactions: No Arrival Time: 14:44 Had a fall or experienced change in No Accompanied By: spouse activities of daily living that may affect Transfer Assistance: None risk of falls: Patient Identification Verified: Yes Signs or symptoms of abuse/neglect since last visito No Secondary Verification Process Yes Hospitalized since last visit: No Completed: Implantable device outside of the clinic excluding No Patient Requires Transmission-Based No cellular tissue based products placed in the center Precautions: since last visit: Patient Has Alerts: Yes Has Dressing in Place as Prescribed: Yes Patient Alerts: Patient on Blood Pain Present Now: No Thinner Warfarin Electronic Signature(s) Signed: 02/04/2018 5:54:46 PM By: Montey Hora Entered By: Montey Hora on 02/04/2018 14:44:45 Cooprider, Courtenay L. ([id number]) -------------------------------------------------------------------------------- Encounter Discharge Information Details Patient Name: [name], [name] Date of Service: 02/04/2018 2:30 PM Medical Record Number: [id number] Patient Account Number: [id number] Date of Birth/Sex: 25-Jan-1926 (84 y.o. M) Treating RN: Roger Shelter Primary Care Bandon Sherwin: Ramonita Lab Other Clinician: Referring Houa Ackert: Ramonita Lab Treating Maleka Contino/Extender: Melburn Hake, HOYT Weeks in Treatment: 24 Encounter Discharge  Information Items Discharge Condition: Stable Ambulatory Status: Walker Discharge Destination: Home Transportation: Private Auto Schedule Follow-up Appointment: Yes Clinical Summary of Care: Electronic Signature(s) Signed: 02/05/2018 5:09:20 PM By: Roger Shelter Entered By: Roger Shelter on 02/04/2018 15:13:51 Heppler, Nyal L. ([id number]) -------------------------------------------------------------------------------- Lower Extremity Assessment Details Patient Name: [name], [name] Date of Service: 02/04/2018 2:30 PM Medical Record Number: [id number] Patient Account Number: [id number] Date of Birth/Sex: Jan 09, 1926 (84 y.o. M) Treating RN: Montey Hora Primary Care Jameson Morrow: Ramonita Lab Other Clinician: Referring Jrue Yambao: Ramonita Lab Treating Beryle Bagsby/Extender: Melburn Hake, HOYT Weeks in Treatment: 24 Vascular Assessment Pulses: Dorsalis Pedis Palpable: [Left:Yes] [Right:Yes] Posterior Tibial Extremity colors, hair growth, and conditions: Extremity Color: [Left:Normal] [Right:Normal] Hair Growth on Extremity: [Left:No] [Right:No] Temperature of Extremity: [Left:Warm] [Right:Warm] Capillary Refill: [Left:< 3 seconds] [Right:< 3 seconds] Toe Nail Assessment Left: Right: Thick: Yes Yes Discolored: Yes Yes Deformed: Yes Yes Improper Length and Hygiene: No No Electronic Signature(s) Signed: 02/04/2018 5:54:46 PM By: Montey Hora Entered By: Montey Hora on 02/04/2018 14:51:20 Langenfeld, April L. ([id number]) -------------------------------------------------------------------------------- Multi Wound Chart Details Patient Name: [name], [name] Date of Service: 02/04/2018 2:30 PM Medical Record Number: [id number] Patient Account Number: [id number] Date of Birth/Sex: 1926-03-13 (84 y.o. M) Treating RN: Cornell Barman Primary Care Bryla Burek: Ramonita Lab Other Clinician: Referring Davelle Anselmi: Ramonita Lab Treating Idona Stach/Extender: Melburn Hake, HOYT Weeks in Treatment: 24 Vital  Signs Height(in): 70 Pulse(bpm): 21 Weight(lbs): 190 Blood Pressure(mmHg): 134/61 Body Mass Index(BMI): 27 Temperature(F): 97.9 Respiratory Rate 16 (breaths/min): Photos: [1:No Photos] [2:No Photos] [N/A:N/A] Wound Location: [1:Left Calcaneus] [2:Right Calcaneus] [N/A:N/A] Wounding Event: [1:Pressure Injury] [2:Pressure Injury] [N/A:N/A] Primary Etiology: [1:Pressure Ulcer] [2:Pressure Ulcer] [N/A:N/A] Comorbid History: [1:Arrhythmia, Hypertension, Received Radiation] [2:N/A] [N/A:N/A] Date Acquired: [1:08/12/2017] [2:08/12/2017] [N/A:N/A] Weeks of Treatment: [1:24] [2:24] [N/A:N/A] Wound Status: [1:Open] [2:Open] [N/A:N/A] Measurements L x W x D [1:1x1.3x0.3] [2:0x0x0] [N/A:N/A] (cm) Area (cm) : [1:1.021] [2:0] [N/A:N/A] Volume (cm) : [1:0.306] [2:0] [N/A:N/A] % Reduction in Area: [1:95.50%] [2:100.00%] [N/A:N/A] % Reduction in Volume: [1:93.30%] [2:100.00%] [N/A:N/A] Classification: [1:Category/Stage III] [  2:Category/Stage III] [N/A:N/A] Exudate Amount: [1:Large] [2:N/A] [N/A:N/A] Exudate Type: [1:Purulent] [2:N/A] [N/A:N/A] Exudate Color: [1:yellow, brown, green] [2:N/A] [N/A:N/A] Foul Odor After Cleansing: [1:Yes] [2:N/A] [N/A:N/A] Odor Anticipated Due to [1:No] [2:N/A] [N/A:N/A] Product Use: Wound Margin: [1:Distinct, outline attached] [2:N/A] [N/A:N/A] Granulation Amount: [1:Large (67-100%)] [2:N/A] [N/A:N/A] Granulation Quality: [1:Pink] [2:N/A] [N/A:N/A] Necrotic Amount: [1:Small (1-33%)] [2:N/A] [N/A:N/A] Exposed Structures: [1:Fat Layer (Subcutaneous Tissue) Exposed: Yes Fascia: No Tendon: No Muscle: No Joint: No Bone: No] [2:N/A] [N/A:N/A] Epithelialization: [1:None] [2:N/A] [N/A:N/A] Periwound Skin Texture: [1:Excoriation: Yes Induration: No Callus: No] [2:No Abnormalities Noted] [N/A:N/A] Crepitus: No Rash: No Scarring: No Periwound Skin Moisture: No Abnormalities Noted No Abnormalities Noted N/A Periwound Skin Color: No Abnormalities Noted No Abnormalities  Noted N/A Temperature: No Abnormality N/A N/A Tenderness on Palpation: Yes No N/A Wound Preparation: Ulcer Cleansing: N/A N/A Rinsed/Irrigated with Saline Topical Anesthetic Applied: Other: lidocaine 4% Treatment Notes Electronic Signature(s) Signed: 02/07/2018 6:17:48 PM By: [name], BSN, RN, CWS, Kim RN, BSN Entered By: [name], BSN, RN, CWS, Kim on 02/04/2018 14:55:45 [name], [name] ([id number]) -------------------------------------------------------------------------------- [place] Details Patient Name: [name], [name] Date of Service: 02/04/2018 2:30 PM Medical Record Number: [id number] Patient Account Number: [id number] Date of Birth/Sex: 06-Mar-1926 (84 y.o. M) Treating RN: Cornell Barman Primary Care Lalonnie Shaffer: Ramonita Lab Other Clinician: Referring Jonavon Trieu: Ramonita Lab Treating Staisha Winiarski/Extender: Melburn Hake, HOYT Weeks in Treatment: 24 Active Inactive ` Abuse / Safety / Falls / Self Care Management Nursing Diagnoses: History of Falls Potential for falls Goals: Patient will not experience any injury related to falls Date Initiated: 08/19/2017 Target Resolution Date: 12/14/2017 Goal Status: Active Interventions: Assess Activities of Daily Living upon admission and as needed Assess fall risk on admission and as needed Assess: immobility, friction, shearing, incontinence upon admission and as needed Assess impairment of mobility on admission and as needed per policy Assess personal safety and home safety (as indicated) on admission and as needed Assess self care needs on admission and as needed Notes: ` Nutrition Nursing Diagnoses: Imbalanced nutrition Potential for alteratiion in Nutrition/Potential for imbalanced nutrition Goals: Patient/caregiver agrees to and verbalizes understanding of need to use nutritional supplements and/or vitamins as prescribed Date Initiated: 08/19/2017 Target Resolution Date: 11/16/2017 Goal Status:  Active Interventions: Assess patient nutrition upon admission and as needed per policy Notes: ` Orientation to the Wound Care Program Nursing Diagnoses: Esther, Maximilian L. ([id number]) Knowledge deficit related to the wound healing center program Goals: Patient/caregiver will verbalize understanding of the [place] Date Initiated: 08/19/2017 Target Resolution Date: 09/14/2017 Goal Status: Active Interventions: Provide education on orientation to the wound center Notes: ` Pain, Acute or Chronic Nursing Diagnoses: Pain, acute or chronic: actual or potential Potential alteration in comfort, pain Goals: Patient/caregiver will verbalize adequate pain control between visits Date Initiated: 08/19/2017 Target Resolution Date: 12/14/2017 Goal Status: Active Interventions: Complete pain assessment as per visit requirements Notes: ` Pressure Nursing Diagnoses: Knowledge deficit related to causes and risk factors for pressure ulcer development Knowledge deficit related to management of pressures ulcers Potential for impaired tissue integrity related to pressure, friction, moisture, and shear Goals: Patient will remain free from development of additional pressure ulcers Date Initiated: 08/19/2017 Target Resolution Date: 12/14/2017 Goal Status: Active Interventions: Assess: immobility, friction, shearing, incontinence upon admission and as needed Assess potential for pressure ulcer upon admission and as needed Notes: ` Wound/Skin Impairment Nursing Diagnoses: Impaired tissue integrity Knowledge deficit related to ulceration/compromised skin integrity [name], [name] ([id number]) Goals: Ulcer/skin breakdown will have a volume reduction of 80%  by week 12 Date Initiated: 08/19/2017 Target Resolution Date: 12/14/2017 Goal Status: Active Interventions: Assess patient/caregiver ability to perform ulcer/skin care regimen upon admission and as needed Assess ulceration(s)  every visit Notes: Electronic Signature(s) Signed: 02/07/2018 6:17:48 PM By: [name], BSN, RN, CWS, Kim RN, BSN Entered By: [name], BSN, RN, CWS, Kim on 02/04/2018 14:55:11 [name], [name] ([id number]) -------------------------------------------------------------------------------- Pain Assessment Details Patient Name: [name], [name] Date of Service: 02/04/2018 2:30 PM Medical Record Number: [id number] Patient Account Number: [id number] Date of Birth/Sex: [birth year] (84 y.o. M) Treating RN: Montey Hora Primary Care Zephyra Bernardi: Ramonita Lab Other Clinician: Referring Laquan Beier: Ramonita Lab Treating Yoneko Talerico/Extender: Melburn Hake, HOYT Weeks in Treatment: 24 Active Problems Location of Pain Severity and Description of Pain Patient Has Paino No Site Locations Pain Management and Medication Current Pain Management: Electronic Signature(s) Signed: 02/04/2018 5:54:46 PM By: Montey Hora Entered By: Montey Hora on 02/04/2018 14:45:00 [name], [name] ([id number]) -------------------------------------------------------------------------------- Patient/Caregiver Education Details Patient Name: [name], [name] Date of Service: 02/04/2018 2:30 PM Medical Record Number: [id number] Patient Account Number: [id number] Date of Birth/Gender: 05/31/1926 (84 y.o. M) Treating RN: Roger Shelter Primary Care Physician: Ramonita Lab Other Clinician: Referring Physician: Ramonita Lab Treating Physician/Extender: Sharalyn Ink in Treatment: 84 Education Assessment Education Provided To: Patient Education Topics Provided Wound Debridement: Handouts: Wound Debridement Methods: Explain/Verbal Responses: State content correctly Wound/Skin Impairment: Handouts: Caring for Your Ulcer Methods: Explain/Verbal Responses: State content correctly Electronic Signature(s) Signed: 02/05/2018 5:09:20 PM By: Roger Shelter Entered By: Roger Shelter on 02/04/2018 15:14:17 [name], [name]  ([id number]) -------------------------------------------------------------------------------- Wound Assessment Details Patient Name: [name], [name] Date of Service: 02/04/2018 2:30 PM Medical Record Number: [id number] Patient Account Number: [id number] Date of Birth/Sex: [birth year] (84 y.o. M) Treating RN: Montey Hora Primary Care Aryaman Haliburton: Ramonita Lab Other Clinician: Referring Damarian Priola: Ramonita Lab Treating Icey Tello/Extender: Melburn Hake, HOYT Weeks in Treatment: 24 Wound Status Wound Number: 1 Primary Etiology: Pressure Ulcer Wound Location: Left Calcaneus Wound Status: Open Wounding Event: Pressure Injury Comorbid Arrhythmia, Hypertension, Received History: Radiation Date Acquired: 08/12/2017 Weeks Of Treatment: 24 Clustered Wound: No Wound Measurements Length: (cm) 1 Width: (cm) 1.3 Depth: (cm) 0.3 Area: (cm) 1.021 Volume: (cm) 0.306 % Reduction in Area: 95.5% % Reduction in Volume: 93.3% Epithelialization: None Tunneling: No Undermining: No Wound Description Classification: Category/Stage III Wound Margin: Distinct, outline attached Exudate Amount: Large Exudate Type: Purulent Exudate Color: yellow, brown, green Foul Odor After Cleansing: Yes Due to Product Use: No Slough/Fibrino Yes Wound Bed Granulation Amount: Large (67-100%) Exposed Structure Granulation Quality: Pink Fascia Exposed: No Necrotic Amount: Small (1-33%) Fat Layer (Subcutaneous Tissue) Exposed: Yes Necrotic Quality: Adherent Slough Tendon Exposed: No Muscle Exposed: No Joint Exposed: No Bone Exposed: No Periwound Skin Texture Texture Color No Abnormalities Noted: No No Abnormalities Noted: Yes Callus: No Temperature / Pain Crepitus: No Temperature: No Abnormality Excoriation: Yes Tenderness on Palpation: Yes Induration: No Rash: No Scarring: No Moisture No Abnormalities Noted: No Wound Preparation Ulcer Cleansing: Rinsed/Irrigated with Saline Bonenberger, Rogan L.  ([id number]) Topical Anesthetic Applied: Other: lidocaine 4%, Electronic Signature(s) Signed: 02/04/2018 5:54:46 PM By: Montey Hora Entered By: Montey Hora on 02/04/2018 14:50:04 Chieffo, Abdullah L. ([id number]) -------------------------------------------------------------------------------- Wound Assessment Details Patient Name: CLEARY, Tonnie L. Date of Service: 02/04/2018 2:30 PM Medical Record Number: [id number] Patient Account Number: [id number] Date of Birth/Sex: May 07, 1926 (84 y.o. M) Treating RN: Montey Hora Primary Care Rut Betterton: Ramonita Lab Other Clinician: Referring Lattie Riege: Ramonita Lab Treating Alaster Asfaw/Extender: Melburn Hake, HOYT Weeks in Treatment: 24 Wound Status Wound Number: 2 Primary  Etiology: Pressure Ulcer Wound Location: Right Calcaneus Wound Status: Open Wounding Event: Pressure Injury Date Acquired: 08/12/2017 Weeks Of Treatment: 24 Clustered Wound: No Wound Measurements Length: (cm) 0 % Width: (cm) 0 % Depth: (cm) 0 Area: (cm) 0 Volume: (cm) 0 Reduction in Area: 100% Reduction in Volume: 100% Wound Description Classification: Category/Stage III Periwound Skin Texture Texture Color No Abnormalities Noted: No No Abnormalities Noted: No Moisture No Abnormalities Noted: No Electronic Signature(s) Signed: 02/04/2018 5:54:46 PM By: Montey Hora Entered By: Montey Hora on 02/04/2018 14:48:31 Burch, Ewing ([id number]) -------------------------------------------------------------------------------- Vitals Details Patient Name: SHANEYFELT, Rayan L. Date of Service: 02/04/2018 2:30 PM Medical Record Number: [id number] Patient Account Number: [id number] Date of Birth/Sex: 25-Sep-1925 (84 y.o. M) Treating RN: Montey Hora Primary Care Edin Skarda: Ramonita Lab Other Clinician: Referring Delany Steury: Ramonita Lab Treating Emya Picado/Extender: Melburn Hake, HOYT Weeks in Treatment: 24 Vital Signs Time Taken: 14:45 Temperature (F): 97.9 Height (in):  70 Pulse (bpm): 89 Weight (lbs): 190 Respiratory Rate (breaths/min): 16 Body Mass Index (BMI): 27.3 Blood Pressure (mmHg): 134/61 Reference Range: 80 - 120 mg / dl Electronic Signature(s) Signed: 02/04/2018 5:54:46 PM By: Montey Hora Entered By: Montey Hora on 02/04/2018 14:45:42

## 2018-02-19 NOTE — Progress Notes (Signed)
[name], [name] ([id number]) Visit Report for 02/04/2018 Chief Complaint Document Details Patient Name: [name], [name] Date of Service: 02/04/2018 2:30 PM Medical Record Number: [id number] Patient Account Number: [id number] Date of Birth/Sex: 1926/05/21 (84 y.o. M) Treating RN: Ahmed Prima Primary Care Provider: Ramonita Lab Other Clinician: Referring Provider: Ramonita Lab Treating Provider/Extender: Melburn Hake, Kendricks Reap Weeks in Treatment: 24 Information Obtained from: Patient Chief Complaint Bilateral Heel pressure ulcers and bilateral great toe DTIs Electronic Signature(s) Signed: 02/04/2018 9:24:47 PM By: Worthy Keeler PA-C Entered By: Worthy Keeler on 02/04/2018 15:23:36 Wadleigh, [name] ([id number]) -------------------------------------------------------------------------------- Debridement Details Patient Name: [name], [name] Date of Service: 02/04/2018 2:30 PM Medical Record Number: [id number] Patient Account Number: [id number] Date of Birth/Sex: June 18, 1926 (84 y.o. M) Treating RN: Cornell Barman Primary Care Provider: Ramonita Lab Other Clinician: Referring Provider: Ramonita Lab Treating Provider/Extender: Melburn Hake, Rinda Rollyson Weeks in Treatment: 24 Debridement Performed for Wound #1 Left Calcaneus Assessment: Performed By: Physician STONE III, Dereon Williamsen E., PA-C Debridement Type: Debridement Pre-procedure Verification/Time Yes - 14:57 Out Taken: Start Time: 14:57 Pain Control: Other : lidocaine 4% Total Area Debrided (L x W): 1 (cm) x 1.3 (cm) = 1.3 (cm) Tissue and other material Viable, Non-Viable, Slough, Subcutaneous, Slough debrided: Level: Skin/Subcutaneous Tissue Debridement Description: Excisional Instrument: Curette Bleeding: Moderate Hemostasis Achieved: Pressure End Time: 14:57 Response to Treatment: Procedure was tolerated well Level of Consciousness: Awake and Alert Post Debridement Measurements of Total Wound Length: (cm) 1 Stage: Category/Stage  III Width: (cm) 1.3 Depth: (cm) 0.4 Volume: (cm) 0.408 Character of Wound/Ulcer Post Stable Debridement: Post Procedure Diagnosis Same as Pre-procedure Electronic Signature(s) Signed: 02/04/2018 9:24:47 PM By: Worthy Keeler PA-C Signed: 02/07/2018 6:17:48 PM By: Gretta Cool, BSN, RN, CWS, Kim RN, BSN Entered By: Gretta Cool, BSN, RN, CWS, Kim on 02/04/2018 14:58:15 Delmundo, [name] ([id number]) -------------------------------------------------------------------------------- HPI Details Patient Name: [name], [name] Date of Service: 02/04/2018 2:30 PM Medical Record Number: [id number] Patient Account Number: [id number] Date of Birth/Sex: Nov 26, 1925 (84 y.o. M) Treating RN: Ahmed Prima Primary Care Provider: Ramonita Lab Other Clinician: Referring Provider: Ramonita Lab Treating Provider/Extender: Melburn Hake, Ahmani Prehn Weeks in Treatment: 24 History of Present Illness Associated Signs and Symptoms: Patient has a history of chronic atrophic relation, hypertension, and long-term use of anticoagulants. He also had a fall with pelvic fracture which occurred on January 2019. HPI Description: 08/19/17 patient presents today for initial evaluation and our clinic concerning issues he has been having with the bilateral heels as well as the lateral great toe was since he was admitted to the nursing facility following a pelvic fracture. Initially he was spending a significant amount of time in the bed although he is now up more often since the healing process has progressed along the way obviously. When he is up he's spending most of the time sitting in a wheelchair at this point. When he is in bed he lays on his back and the covers/sheets are pushing down on his toes bilaterally which I think it may be what's causing the issues with his deep tissue injury to the bilateral great toes. With that being said the hills also appear to be pressure in nature and I think that laying in the bed getting pressure to the  sites is what has led to the formation of these on stage will pressure ulcers. Fortunately there does not appear to be any infection in both areas of ulceration of the heel appear to be stable at this point. Patient does not have any significant pain  at the site she does have some of the eschar along the edges which is starting to lift up and come all hopefully slowly but surely this will occur. Patient did have arterial studies performed April 2018 which showed a normal TBI on the right knee slightly depressed TBI on the left but this does not appear to have changed significantly at that point. Specifically this was 0.75 on the right and 0.61 on the left. 09/13/17 on evaluation today patient presents for a follow-up evaluation regarding his bilateral heal ulcers. He has been tolerating the dressing changes fairly well although he did have some bleeding earlier today when it appears some of the skin on the surrounding portion of the wound got pulled where this is starting to flake off. Subsequently there does not appear to be any bleeding right now. Since I last saw him he has been discharged from the facility and is now back at home. His wife is having a very difficult time being able to get him up and in the car in order to come for an appointment which is why they missed the last appointment have not been able to come back sooner. Fortunately the he does not have any signs of infection. No fevers chills noted. He does have difficulty walking due to pain in the bilateral heels. 10/11/17 on evaluation today patient presents for follow-up evaluation concerning his ongoing bilateral heal ulcers. He has been tolerating the dressing changes which were Betadine paints daily without complication. I have not seen him since September 13, 2017. Pressure that I saw him August 19, 2017. Subsequently those are the only prior visits before today. His left heel actually seems to have a significant amount of eschar that  is starting to lift up at this point and seems to be a little more loose as far as drainage building up underneath the eschar. The right eschar though there are some parts lifting up seems to still be fairly stable which is good news. His toe ulcer is almost completely healed a lot of that eschar came off and it looks very well u 01/06/18 on evaluation today patient actually appears to be doing excellent in regard to his bilateral heal ulcers. The right heel is very close to completely closing and overall this is doing excellent. Left heel #2 great improvement as it has been for the past several weeks. I'm extremely pleased in this regard. Overall the patient is also happy to his wife seems to be doing an excellent job caring for him in my pinion. 01/14/18 on evaluation today patient actually appears to be doing better in regard to both heal ulcers. The right heel is almost completely healed there was some callous buildup around the edge of the wound but overall I feel like things are progressing quite nicely. In regard to the left heel also feel like there's good epithelialization however this has been obviously somewhat slower it was also much larger initially when the eschar was removed so again I'm not really too worried about it I think it's progressing in a good fashion. Overall I feel like the patient is doing excellent. 01/21/18 on evaluation today patient appears to actually be doing rather well in regard to his lower extremity heels. His right lower extremity actually was almost completely healed although there was a very small opening still remaining that was noted during debridement today. Nonetheless overall I feel like he is making good progress. No fevers, chills, nausea, or vomiting noted at this time.  [name], Coran L. ([id number]) 01/28/18 on evaluation today patient's right heel ulcer appears to be all but closed. There still just a very small almost pinpoint opening at this point  overall I'm very pleased with how things have progressed. The left ulcer is still more open although there is definitely signs of epithelialization even since last week I feel like things are improving. 02/04/18 on evaluation today patient appears to actually be doing very well in regard to his right field ulcer this appears to be completely healed in fact which is excellent news. In regard to left heel ulcer this is smaller although not completely close to yet. Nonetheless he seemed to be making great progress. Electronic Signature(s) Signed: 02/04/2018 9:24:47 PM By: Worthy Keeler PA-C Entered By: Worthy Keeler on 02/04/2018 15:38:59 [name], Zeddie L. ([id number]) -------------------------------------------------------------------------------- Physical Exam Details Patient Name: [name], [name] Date of Service: 02/04/2018 2:30 PM Medical Record Number: [id number] Patient Account Number: [id number] Date of Birth/Sex: 31-Jan-1926 (84 y.o. M) Treating RN: Ahmed Prima Primary Care Provider: Ramonita Lab Other Clinician: Referring Provider: Ramonita Lab Treating Provider/Extender: STONE III, Charlsie Fleeger Weeks in Treatment: 65 Constitutional Well-nourished and well-hydrated in no acute distress. Respiratory normal breathing without difficulty. Psychiatric this patient is able to make decisions and demonstrates good insight into disease process. Alert and Oriented x 3. pleasant and cooperative. Notes On evaluation patient's wound on the right heel again was completely closed which is excellent news the left heel though still open appears to be smaller than it was previous. In general he is making great progress at this time. Electronic Signature(s) Signed: 02/04/2018 9:24:47 PM By: Worthy Keeler PA-C Entered By: Worthy Keeler on 02/04/2018 15:39:47 [name], Marquez L. ([id number]) -------------------------------------------------------------------------------- Physician Orders Details Patient  Name: KACYN, SOUDER L. Date of Service: 02/04/2018 2:30 PM Medical Record Number: [id number] Patient Account Number: [id number] Date of Birth/Sex: 1925-08-31 (84 y.o. M) Treating RN: Cornell Barman Primary Care Provider: Ramonita Lab Other Clinician: Referring Provider: Ramonita Lab Treating Provider/Extender: Melburn Hake, Delanna Blacketer Weeks in Treatment: 67 Verbal / Phone Orders: No Diagnosis Coding Wound Cleansing Wound #1 Left Calcaneus o Clean wound with Normal Saline. o Cleanse wound with mild soap and water o May Shower, gently pat wound dry prior to applying new dressing. Anesthetic (add to Medication List) Wound #1 Left Calcaneus o Topical Lidocaine 4% cream applied to wound bed prior to debridement (In Clinic Only). Skin Barriers/Peri-Wound Care Wound #1 Left Calcaneus o Skin Prep Primary Wound Dressing Wound #1 Left Calcaneus o Silver Collagen - place in wound dry o Plain packing gauze Secondary Dressing Wound #1 Left Calcaneus o Boardered Foam Dressing Dressing Change Frequency Wound #1 Left Calcaneus o Change dressing every other day. Follow-up Appointments Wound #1 Left Calcaneus o Return Appointment in 1 week. Off-Loading Wound #1 Left Calcaneus o Turn and reposition every 2 hours o Other: - float heels at bedtime Additional Orders / Instructions Wound #1 Left Calcaneus o Increase protein intake. [name], Carden L. ([id number]) Electronic Signature(s) Signed: 02/04/2018 9:24:47 PM By: Worthy Keeler PA-C Signed: 02/07/2018 6:17:48 PM By: Gretta Cool, BSN, RN, CWS, Kim RN, BSN Entered By: Gretta Cool, BSN, RN, CWS, Kim on 02/04/2018 14:59:30 [name], Tiler L. ([id number]) -------------------------------------------------------------------------------- Problem List Details Patient Name: [name], [name] Date of Service: 02/04/2018 2:30 PM Medical Record Number: [id number] Patient Account Number: [id number] Date of Birth/Sex: 1926-05-07 (84 y.o. M) Treating RN:  Ahmed Prima Primary Care Provider: Ramonita Lab Other Clinician: Referring Provider: Ramonita Lab Treating Provider/Extender: Melburn Hake, Ayen Viviano  Weeks in Treatment: 24 Active Problems ICD-10 Evaluated Encounter Code Description Active Date Today Diagnosis L89.623 Pressure ulcer of left heel, stage 3 08/19/2017 No Yes L89.613 Pressure ulcer of right heel, stage 3 08/19/2017 No Yes I48.2 Chronic atrial fibrillation 08/19/2017 No Yes I10 Essential (primary) hypertension 08/19/2017 No Yes Z79.01 Long term (current) use of anticoagulants 08/19/2017 No Yes Inactive Problems Resolved Problems Electronic Signature(s) Signed: 02/04/2018 9:24:47 PM By: Worthy Keeler PA-C Entered By: Worthy Keeler on 02/04/2018 15:23:19 [name], Taytum L. ([id number]) -------------------------------------------------------------------------------- Progress Note Details Patient Name: [name], [name] Date of Service: 02/04/2018 2:30 PM Medical Record Number: [id number] Patient Account Number: [id number] Date of Birth/Sex: 12-17-1925 (84 y.o. M) Treating RN: Ahmed Prima Primary Care Provider: Ramonita Lab Other Clinician: Referring Provider: Ramonita Lab Treating Provider/Extender: Melburn Hake, Ece [place] Weeks in Treatment: 24 Subjective Chief Complaint Information obtained from Patient Bilateral Heel pressure ulcers and bilateral great toe DTIs History of Present Illness (HPI) The following HPI elements were documented for the patient's wound: Associated Signs and Symptoms: Patient has a history of chronic atrophic relation, hypertension, and long-term use of anticoagulants. He also had a fall with pelvic fracture which occurred on January 2019. 08/19/17 patient presents today for initial evaluation and our clinic concerning issues he has been having with the bilateral heels as well as the lateral great toe was since he was admitted to the nursing facility following a pelvic fracture. Initially he was spending a  significant amount of time in the bed although he is now up more often since the healing process has progressed along the way obviously. When he is up he's spending most of the time sitting in a wheelchair at this point. When he is in bed he lays on his back and the covers/sheets are pushing down on his toes bilaterally which I think it may be what's causing the issues with his deep tissue injury to the bilateral great toes. With that being said the hills also appear to be pressure in nature and I think that laying in the bed getting pressure to the sites is what has led to the formation of these on stage will pressure ulcers. Fortunately there does not appear to be any infection in both areas of ulceration of the heel appear to be stable at this point. Patient does not have any significant pain at the site she does have some of the eschar along the edges which is starting to lift up and come all hopefully slowly but surely this will occur. Patient did have arterial studies performed April 2018 which showed a normal TBI on the right knee slightly depressed TBI on the left but this does not appear to have changed significantly at that point. Specifically this was 0.75 on the right and 0.61 on the left. 09/13/17 on evaluation today patient presents for a follow-up evaluation regarding his bilateral heal ulcers. He has been tolerating the dressing changes fairly well although he did have some bleeding earlier today when it appears some of the skin on the surrounding portion of the wound got pulled where this is starting to flake off. Subsequently there does not appear to be any bleeding right now. Since I last saw him he has been discharged from the facility and is now back at home. His wife is having a very difficult time being able to get him up and in the car in order to come for an appointment which is why they missed the last appointment have not been able to  come back sooner. Fortunately the he does  not have any signs of infection. No fevers chills noted. He does have difficulty walking due to pain in the bilateral heels. 10/11/17 on evaluation today patient presents for follow-up evaluation concerning his ongoing bilateral heal ulcers. He has been tolerating the dressing changes which were Betadine paints daily without complication. I have not seen him since September 13, 2017. Pressure that I saw him August 19, 2017. Subsequently those are the only prior visits before today. His left heel actually seems to have a significant amount of eschar that is starting to lift up at this point and seems to be a little more loose as far as drainage building up underneath the eschar. The right eschar though there are some parts lifting up seems to still be fairly stable which is good news. His toe ulcer is almost completely healed a lot of that eschar came off and it looks very well u 01/06/18 on evaluation today patient actually appears to be doing excellent in regard to his bilateral heal ulcers. The right heel is very close to completely closing and overall this is doing excellent. Left heel #2 great improvement as it has been for the past several weeks. I'm extremely pleased in this regard. Overall the patient is also happy to his wife seems to be doing an excellent job caring for him in my pinion. 01/14/18 on evaluation today patient actually appears to be doing better in regard to both heal ulcers. The right heel is almost completely healed there was some callous buildup around the edge of the wound but overall I feel like things are progressing [place], [place] ([id number]) quite nicely. In regard to the left heel also feel like there's good epithelialization however this has been obviously somewhat slower it was also much larger initially when the eschar was removed so again I'm not really too worried about it I think it's progressing in a good fashion. Overall I feel like the patient is doing  excellent. 01/21/18 on evaluation today patient appears to actually be doing rather well in regard to his lower extremity heels. His right lower extremity actually was almost completely healed although there was a very small opening still remaining that was noted during debridement today. Nonetheless overall I feel like he is making good progress. No fevers, chills, nausea, or vomiting noted at this time. 01/28/18 on evaluation today patient's right heel ulcer appears to be all but closed. There still just a very small almost pinpoint opening at this point overall I'm very pleased with how things have progressed. The left ulcer is still more open although there is definitely signs of epithelialization even since last week I feel like things are improving. 02/04/18 on evaluation today patient appears to actually be doing very well in regard to his right field ulcer this appears to be completely healed in fact which is excellent news. In regard to left heel ulcer this is smaller although not completely close to yet. Nonetheless he seemed to be making great progress. Patient History Information obtained from Patient. Family History No family history of Cancer, Diabetes, Heart Disease, Hereditary Spherocytosis, Hypertension, Kidney Disease, Lung Disease, Seizures, Stroke, Thyroid Problems, Tuberculosis. Social History Former smoker, Marital Status - Married, Alcohol Use - Rarely - 2oz day, Drug Use - No History, Caffeine Use - Daily. Review of Systems (ROS) Constitutional Symptoms (General Health) Denies complaints or symptoms of Fever, Chills. Respiratory The patient has no complaints or symptoms. Cardiovascular The patient has no  complaints or symptoms. Psychiatric The patient has no complaints or symptoms. Objective Constitutional Well-nourished and well-hydrated in no acute distress. Vitals Time Taken: 2:45 PM, Height: 70 in, Weight: 190 lbs, BMI: 27.3, Temperature: 97.9 F, Pulse: 89  bpm, Respiratory Rate: 16 breaths/min, Blood Pressure: 134/61 mmHg. Respiratory normal breathing without difficulty. Psychiatric [name], [place] ([id number]) this patient is able to make decisions and demonstrates good insight into disease process. Alert and Oriented x 3. pleasant and cooperative. General Notes: On evaluation patient's wound on the right heel again was completely closed which is excellent news the left heel though still open appears to be smaller than it was previous. In general he is making great progress at this time. Integumentary (Hair, Skin) Wound #1 status is Open. Original cause of wound was Pressure Injury. The wound is located on the Left Calcaneus. The wound measures 1cm length x 1.3cm width x 0.3cm depth; 1.021cm^2 area and 0.306cm^3 volume. There is Fat Layer (Subcutaneous Tissue) Exposed exposed. There is no tunneling or undermining noted. There is a large amount of purulent drainage noted. Foul odor after cleansing was noted. The wound margin is distinct with the outline attached to the wound base. There is large (67-100%) pink granulation within the wound bed. There is a small (1-33%) amount of necrotic tissue within the wound bed including Adherent Slough. The periwound skin appearance had no abnormalities noted for color. The periwound skin appearance exhibited: Excoriation. The periwound skin appearance did not exhibit: Callus, Crepitus, Induration, Rash, Scarring. Periwound temperature was noted as No Abnormality. The periwound has tenderness on palpation. Wound #2 status is Open. Original cause of wound was Pressure Injury. The wound is located on the Right Calcaneus. The wound measures 0cm length x 0cm width x 0cm depth; 0cm^2 area and 0cm^3 volume. Assessment Active Problems ICD-10 Pressure ulcer of left heel, stage 3 Pressure ulcer of right heel, stage 3 Chronic atrial fibrillation Essential (primary) hypertension Long term (current) use of  anticoagulants Procedures Wound #1 Pre-procedure diagnosis of Wound #1 is a Pressure Ulcer located on the Left Calcaneus . There was a Excisional Skin/Subcutaneous Tissue Debridement with a total area of 1.3 sq cm performed by STONE III, Alexandro Line E., PA-C. With the following instrument(s): Curette to remove Viable and Non-Viable tissue/material. Material removed includes Subcutaneous Tissue and Slough and after achieving pain control using Other (lidocaine 4%). No specimens were taken. A time out was conducted at 14:57, prior to the start of the procedure. A Moderate amount of bleeding was controlled with Pressure. The procedure was tolerated well. Patient s Level of Consciousness post procedure was recorded as Awake and Alert. Post Debridement Measurements: 1cm length x 1.3cm width x 0.4cm depth; 0.408cm^3 volume. Post debridement Stage noted as Category/Stage III. Character of Wound/Ulcer Post Debridement is stable. Post procedure Diagnosis Wound #1: Same as Pre-Procedure [name], [place] ([id number]) Plan Wound Cleansing: Wound #1 Left Calcaneus: Clean wound with Normal Saline. Cleanse wound with mild soap and water May Shower, gently pat wound dry prior to applying new dressing. Anesthetic (add to Medication List): Wound #1 Left Calcaneus: Topical Lidocaine 4% cream applied to wound bed prior to debridement (In Clinic Only). Skin Barriers/Peri-Wound Care: Wound #1 Left Calcaneus: Skin Prep Primary Wound Dressing: Wound #1 Left Calcaneus: Silver Collagen - place in wound dry Plain packing gauze Secondary Dressing: Wound #1 Left Calcaneus: Boardered Foam Dressing Dressing Change Frequency: Wound #1 Left Calcaneus: Change dressing every other day. Follow-up Appointments: Wound #1 Left Calcaneus: Return Appointment in 1 week.  Off-Loading: Wound #1 Left Calcaneus: Turn and reposition every 2 hours Other: - float heels at bedtime Additional Orders / Instructions: Wound #1 Left  Calcaneus: Increase protein intake. I am going to suggest currently that we actually go ahead and continue to treat this wound the same way as it seems to be making excellent progress at this time. We will continue to pack the Prisma into the areas of undermining in depth. We'll see how things appear next week. It's possible we may want to switch to Endoform just depending on how he is progressing but so far I've been seeing great progress week by week which has led me to continue with the Current wound care measures I think that is still appropriate. Please see above for specific wound care orders. We will see patient for re-evaluation in 1 week(s) here in the clinic. If anything worsens or changes patient will contact our office for additional recommendations. Electronic Signature(s) Signed: 02/04/2018 9:24:47 PM By: Worthy Keeler PA-C Entered By: Worthy Keeler on 02/04/2018 15:40:14 [name], [place] ([id number]) -------------------------------------------------------------------------------- ROS/PFSH Details Patient Name: [name], [name] Date of Service: 02/04/2018 2:30 PM Medical Record Number: [id number] Patient Account Number: [id number] Date of Birth/Sex: 1926/06/06 (84 y.o. M) Treating RN: Ahmed Prima Primary Care Provider: Ramonita Lab Other Clinician: Referring Provider: Ramonita Lab Treating Provider/Extender: Melburn Hake, Tayson Schnelle Weeks in Treatment: 24 Information Obtained From Patient Wound History Do you currently have one or more open woundso Yes How many open wounds do you currently haveo 2 Approximately how long have you had your woundso 1 week How have you been treating your wound(s) until nowo nothing Has your wound(s) ever healed and then re-openedo No Have you had any lab work done in the past montho No Have you tested positive for osteomyelitis (bone infection)o No Have you had any tests for circulation on your legso No Have you had other problems associated  with your woundso Swelling Constitutional Symptoms (General Health) Complaints and Symptoms: Negative for: Fever; Chills Respiratory Complaints and Symptoms: No Complaints or Symptoms Cardiovascular Complaints and Symptoms: No Complaints or Symptoms Medical History: Positive for: Arrhythmia - a-fib; Hypertension Oncologic Medical History: Positive for: Received Radiation Psychiatric Complaints and Symptoms: No Complaints or Symptoms Immunizations Pneumococcal Vaccine: Received Pneumococcal Vaccination: Yes Implantable Devices Family and Social History Toruno, Namon L. ([id number]) Cancer: No; Diabetes: No; Heart Disease: No; Hereditary Spherocytosis: No; Hypertension: No; Kidney Disease: No; Lung Disease: No; Seizures: No; Stroke: No; Thyroid Problems: No; Tuberculosis: No; Former smoker; Marital Status - Married; Alcohol Use: Rarely - 2oz day; Drug Use: No History; Caffeine Use: Daily; Financial Concerns: No; Food, Clothing or Shelter Needs: No; Support System Lacking: No; Transportation Concerns: No; Advanced Directives: Yes (Not Provided); Patient does not want information on Advanced Directives; Do not resuscitate: No; Living Will: Yes (Not Provided); Medical Power of Attorney: No Physician Affirmation I have reviewed and agree with the above information. Electronic Signature(s) Signed: 02/04/2018 9:24:47 PM By: Worthy Keeler PA-C Signed: 02/10/2018 4:59:33 PM By: Alric Quan Entered By: Worthy Keeler on 02/04/2018 15:39:20 [name], [name] ([id number]) -------------------------------------------------------------------------------- SuperBill Details Patient Name: [name], [name] Date of Service: 02/04/2018 Medical Record Number: [id number] Patient Account Number: [id number] Date of Birth/Sex: [birth year] (84 y.o. M) Treating RN: Ahmed Prima Primary Care Provider: Ramonita Lab Other Clinician: Referring Provider: Ramonita Lab Treating Provider/Extender: Melburn Hake, Makhiya Coburn Weeks in Treatment: 24 Diagnosis Coding ICD-10 Codes Code Description [phone] Pressure ulcer of left heel, stage 3 L89.613 Pressure ulcer of  right heel, stage 3 I48.2 Chronic atrial fibrillation I10 Essential (primary) hypertension Z79.01 Long term (current) use of anticoagulants Facility Procedures CPT4 Code: [id number] Description: 47829 - DEB SUBQ TISSUE 20 SQ CM/< ICD-10 Diagnosis Description L89.623 Pressure ulcer of left heel, stage 3 Modifier: Quantity: 1 Physician Procedures CPT4 Code: [id number] Description: 65784 - WC PHYS SUBQ TISS 20 SQ CM ICD-10 Diagnosis Description L89.623 Pressure ulcer of left heel, stage 3 Modifier: Quantity: 1 Electronic Signature(s) Signed: 02/04/2018 9:24:47 PM By: Worthy Keeler PA-C Entered By: Worthy Keeler on 02/04/2018 15:40:33

## 2018-02-20 ENCOUNTER — Emergency Department: Payer: PPO

## 2018-02-20 ENCOUNTER — Emergency Department
Admission: EM | Admit: 2018-02-20 | Discharge: 2018-02-20 | Disposition: A | Discharge status: Home / Self Care | Payer: PPO | Attending: Emergency Medicine | Admitting: Emergency Medicine

## 2018-02-20 DIAGNOSIS — Z87891 Personal history of nicotine dependence: Secondary | ICD-10-CM | POA: Diagnosis not present

## 2018-02-20 DIAGNOSIS — R531 Weakness: Secondary | ICD-10-CM | POA: Insufficient documentation

## 2018-02-20 DIAGNOSIS — I10 Essential (primary) hypertension: Secondary | ICD-10-CM | POA: Insufficient documentation

## 2018-02-20 DIAGNOSIS — Z7901 Long term (current) use of anticoagulants: Secondary | ICD-10-CM | POA: Insufficient documentation

## 2018-02-20 DIAGNOSIS — Z96651 Presence of right artificial knee joint: Secondary | ICD-10-CM | POA: Diagnosis not present

## 2018-02-20 DIAGNOSIS — R404 Transient alteration of awareness: Secondary | ICD-10-CM | POA: Diagnosis not present

## 2018-02-20 DIAGNOSIS — R410 Disorientation, unspecified: Secondary | ICD-10-CM | POA: Diagnosis not present

## 2018-02-20 DIAGNOSIS — Z8546 Personal history of malignant neoplasm of prostate: Secondary | ICD-10-CM | POA: Insufficient documentation

## 2018-02-20 DIAGNOSIS — Z8673 Personal history of transient ischemic attack (TIA), and cerebral infarction without residual deficits: Secondary | ICD-10-CM | POA: Insufficient documentation

## 2018-02-20 DIAGNOSIS — M6281 Muscle weakness (generalized): Secondary | ICD-10-CM | POA: Diagnosis not present

## 2018-02-20 DIAGNOSIS — Z79899 Other long term (current) drug therapy: Secondary | ICD-10-CM | POA: Insufficient documentation

## 2018-02-20 LAB — CBC WITH DIFFERENTIAL/PLATELET
Basophils Absolute: 0 10*3/uL (ref 0–0.1)
Basophils Relative: 0 %
Eosinophils Absolute: 0 10*3/uL (ref 0–0.7)
Eosinophils Relative: 0 %
HEMATOCRIT: 38.9 % — AB (ref 40.0–52.0)
HEMOGLOBIN: 13.1 g/dL (ref 13.0–18.0)
LYMPHS ABS: 1.7 10*3/uL (ref 1.0–3.6)
LYMPHS PCT: 13 %
MCH: 32.7 pg (ref 26.0–34.0)
MCHC: 33.7 g/dL (ref 32.0–36.0)
MCV: 97 fL (ref 80.0–100.0)
MONOS PCT: 3 %
Monocytes Absolute: 0.3 10*3/uL (ref 0.2–1.0)
NEUTROS PCT: 84 %
Neutro Abs: 11.3 10*3/uL — ABNORMAL HIGH (ref 1.4–6.5)
Platelets: 195 10*3/uL (ref 150–440)
RBC: 4 MIL/uL — AB (ref 4.40–5.90)
RDW: 15.6 % — ABNORMAL HIGH (ref 11.5–14.5)
WBC: 13.4 10*3/uL — ABNORMAL HIGH (ref 3.8–10.6)

## 2018-02-20 LAB — COMPREHENSIVE METABOLIC PANEL
ALBUMIN: 3.8 g/dL (ref 3.5–5.0)
ALK PHOS: 56 U/L (ref 38–126)
ALT: 15 U/L (ref 0–44)
ANION GAP: 6 (ref 5–15)
AST: 29 U/L (ref 15–41)
BILIRUBIN TOTAL: 0.8 mg/dL (ref 0.3–1.2)
BUN: 24 mg/dL — AB (ref 8–23)
CO2: 23 mmol/L (ref 22–32)
Calcium: 9 mg/dL (ref 8.9–10.3)
Chloride: 111 mmol/L (ref 98–111)
Creatinine, Ser: 1.06 mg/dL (ref 0.61–1.24)
GFR calc Af Amer: >60 mL/min (ref >60)
GFR calc non Af Amer: 60 mL/min — ABNORMAL LOW (ref >60)
GLUCOSE: 113 mg/dL — AB (ref 70–99)
POTASSIUM: 4.2 mmol/L (ref 3.5–5.1)
SODIUM: 140 mmol/L (ref 135–145)
Total Protein: 6.6 g/dL (ref 6.5–8.1)

## 2018-02-20 LAB — URINALYSIS, COMPLETE (UACMP) WITH MICROSCOPIC
BACTERIA UA: NONE SEEN
Bilirubin Urine: NEGATIVE
GLUCOSE, UA: NEGATIVE mg/dL
Hgb urine dipstick: NEGATIVE
Ketones, ur: NEGATIVE mg/dL
Leukocytes, UA: NEGATIVE
Nitrite: NEGATIVE
PROTEIN: NEGATIVE mg/dL
Specific Gravity, Urine: 1.023 (ref 1.005–1.030)
pH: 6 (ref 5.0–8.0)

## 2018-02-20 LAB — PROTIME-INR
INR: 1.81
PROTHROMBIN TIME: 20.8 s — AB (ref 11.4–15.2)

## 2018-02-20 LAB — BRAIN NATRIURETIC PEPTIDE: B Natriuretic Peptide: 179 pg/mL — ABNORMAL HIGH (ref 0.0–100.0)

## 2018-02-20 LAB — TROPONIN I: Troponin I: <0.03 ng/mL (ref <0.03)

## 2018-02-20 MED ORDER — SODIUM CHLORIDE 0.9 % IV SOLN
Freq: Once | INTRAVENOUS | Status: AC
Start: 2018-02-20 — End: 2018-02-20
  Administered 2018-02-20: 20:00:00 via INTRAVENOUS

## 2018-02-20 NOTE — ED Triage Notes (Signed)
Pt BIB [facility] from home (lives with wife) for weakness. EMS EKG showed third degree block. On warfarin. Walks with walker. Glucose 130. ABCs intact. NAD.

## 2018-02-20 NOTE — Discharge Instructions (Addendum)
You may have been a little bit dehydrated.  Since the weather so warm it might be a good idea to drink a little more water.  Please return if you have any further problems or feel worse at all.  Please have your regular doctor check on you later this coming week.

## 2018-02-20 NOTE — ED Provider Notes (Signed)
[facility] Emergency Department Provider Note   ____________________________________________   First MD Initiated Contact with Patient 02/20/18 1822     (approximate)  I have reviewed the triage vital signs and the nursing notes.   HISTORY  Chief Complaint Weakness    HPI [name] is a 84 y.o. male who reports he got weak 3 or 4 hours ago.  His wife comes in reports he got up that way in the morning.  He is kind of diffusely weak and a little woozy he is not overtly vertiginous is not lightheaded just does not feel good.  He denies any headache coughing shortness of breath chest tightness chest pain belly pain or anything else.  He has a history of atrial fibrillation and takes Coumadin for that he has had hypertension he has had a TIA in the past.   Past Medical History:  Diagnosis Date  . Atrial fibrillation ([place])   . Cancer ([place])    PROSTATE  . Dysrhythmia    AFIB  . GERD (gastroesophageal reflux disease)   . Hypertension     Patient Active Problem List   Diagnosis Date Noted  . Fracture closed, pubis ([place]) 07/15/2017  . Bilateral carotid artery stenosis 01/01/2017  . Peripheral artery disease ([place]) 01/01/2017  . Essential hypertension 01/01/2017  . Hyperlipidemia 01/01/2017    Past Surgical History:  Procedure Laterality Date  . CAROTID ENDARTERECTOMY Left   . CATARACT EXTRACTION W/PHACO Right 10/13/2015   Procedure: CATARACT EXTRACTION PHACO AND INTRAOCULAR LENS PLACEMENT (IOC);  Surgeon: Leandrew Koyanagi, MD;  Location: [facility] ORS;  Service: Ophthalmology;  Laterality: Right;  Lot # [id number] H US:01:23.3 AP:17.2 CDE:14.31  . CATARACT EXTRACTION W/PHACO Left 12/12/2015   Procedure: CATARACT EXTRACTION PHACO AND INTRAOCULAR LENS PLACEMENT (IOC);  Surgeon: Leandrew Koyanagi, MD;  Location: [facility] ORS;  Service: Ophthalmology;  Laterality: Left;  [place] 1.10 AP% 15.9 CDE 11.22 FLUID PACK LOT # [id number]  . JOINT REPLACEMENT Right      TKR    Prior to Admission medications   Medication Sig Start Date End Date Taking? Authorizing Provider  amLODipine (NORVASC) 5 MG tablet Take 5 mg by mouth daily.  07/23/16  Yes [provider]  Cholecalciferol (VITAMIN D-3 PO) Take 1 tablet by mouth daily.    Yes [provider]  lovastatin (MEVACOR) 40 MG tablet Take 80 mg by mouth at bedtime.    Yes [provider]  Multiple Vitamin (MULTIVITAMIN) capsule Take 1 capsule by mouth daily.   Yes [provider]  pantoprazole (PROTONIX) 40 MG tablet TAKE 1 TABLET BY MOUTH EVERY DAY 10/24/15  Yes [provider]  vitamin B-12 (CYANOCOBALAMIN) 1000 MCG tablet Take 1,000 mcg by mouth daily.   Yes [provider]  warfarin (COUMADIN) 2.5 MG tablet Take 2.5 mg by mouth daily. 2 TABS DAILY    Yes [provider]  bisacodyl (DULCOLAX) 5 MG EC tablet Take 1 tablet (5 mg total) by mouth daily as needed for moderate constipation. Patient not taking: Reported on 08/30/2017 07/17/17   [name], MD  HYDROcodone-acetaminophen (NORCO/VICODIN) 5-325 MG tablet Take 1-2 tablets by mouth every 4 (four) hours as needed for moderate pain. Patient not taking: Reported on 08/30/2017 07/17/17   [name], MD  methocarbamol (ROBAXIN) 500 MG tablet Take 1 tablet (500 mg total) by mouth every 6 (six) hours as needed for muscle spasms. Patient not taking: Reported on 08/30/2017 07/17/17   [name], MD    Allergies  Patient has no known allergies.  Family History  Problem Relation Age of Onset  . Heart attack Father     Social History Social History   Tobacco Use  . Smoking status: Former Research scientist (life sciences)  . Smokeless tobacco: Never Used  Substance Use Topics  . Alcohol use: Yes    Comment: 1.5 oz daily - 6/3  last use  . Drug use: No    Review of Systems  Constitutional: No fever/chills Eyes: No visual changes. ENT: No sore throat. Cardiovascular: Denies chest  pain. Respiratory: Denies shortness of breath. Gastrointestinal: No abdominal pain.  No nausea, no vomiting.  No diarrhea.  No constipation. Genitourinary: Negative for dysuria. Musculoskeletal: Negative for back pain. Skin: Negative for rash. Neurological: Negative for headaches, focal weakness   ____________________________________________   PHYSICAL EXAM:  VITAL SIGNS: ED Triage Vitals  Enc Vitals Group     BP 02/20/18 1820 139/65     Pulse Rate 02/20/18 1820 78     Resp --      Temp 02/20/18 1820 98.3 F (36.8 C)     Temp Source 02/20/18 1820 Oral     SpO2 02/20/18 1820 93 %     Weight 02/20/18 1822 176 lb 5.9 oz (80 kg)     Height 02/20/18 1822 6' (1.829 m)     Head Circumference --      Peak Flow --      Pain Score 02/20/18 1821 0     Pain Loc --      Pain Edu? --      Excl. in [place]? --     Constitutional: Alert and oriented. Well appearing and in no acute distress. Eyes: Conjunctivae are normal. PER. EOMI. Head: Atraumatic. Nose: No congestion/rhinnorhea. Mouth/Throat: Mucous membranes are moist.  Oropharynx non-erythematous. Neck: No stridor. Cardiovascular: Normal rate, regular rhythm. Grossly normal heart sounds.  Good peripheral circulation. Respiratory: Normal respiratory effort.  No retractions. Lungs CTAB. Gastrointestinal: Soft and nontender. No distention. No abdominal bruits. No CVA tenderness. Musculoskeletal: No lower extremity tenderness nor edema.   Neurologic:  Normal speech and language. No gross focal neurologic deficits are appreciated. Skin:  Skin is warm, dry and intact. No rash noted. Psychiatric: Mood and affect are normal. Speech and behavior are normal.  ____________________________________________   LABS (all labs ordered are listed, but only abnormal results are displayed)  Labs Reviewed  COMPREHENSIVE METABOLIC PANEL - Abnormal; Notable for the following components:      Result Value   Glucose, Bld 113 (*)    BUN 24 (*)    GFR  calc non Af Amer 60 (*)    All other components within normal limits  BRAIN NATRIURETIC PEPTIDE - Abnormal; Notable for the following components:   B Natriuretic Peptide 179.0 (*)    All other components within normal limits  CBC WITH DIFFERENTIAL/PLATELET - Abnormal; Notable for the following components:   WBC 13.4 (*)    RBC 4.00 (*)    HCT 38.9 (*)    RDW 15.6 (*)    Neutro Abs 11.3 (*)    All other components within normal limits  URINALYSIS, COMPLETE (UACMP) WITH MICROSCOPIC - Abnormal; Notable for the following components:   Color, Urine YELLOW (*)    APPearance HAZY (*)    All other components within normal limits  PROTIME-INR - Abnormal; Notable for the following components:   Prothrombin Time 20.8 (*)    All other components within normal limits  TROPONIN I   ____________________________________________  EKG  EKG read and interpreted by me shows atrial flutter at a rate of 78 normal axis no acute ST-T wave changes V2 EMS EKGs that I saw in the room did not look like third-degree AV block. ____________________________________________  RADIOLOGY  ED MD interpretation:    Official radiology report(s): Dg Chest 2 View  Result Date: 02/20/2018 CLINICAL DATA:  Weakness EXAM: CHEST - 2 VIEW COMPARISON:  07/15/2017 FINDINGS: No acute opacity or pleural effusion. Stable cardiomediastinal silhouette with aortic atherosclerosis. No pneumothorax. Degenerative changes of the spine. IMPRESSION: No active cardiopulmonary disease. Electronically Signed   By: Donavan Foil M.D.   On: 02/20/2018 19:21   Ct Head Wo Contrast  Result Date: 02/20/2018 CLINICAL DATA:  Weakness and confusion. EXAM: CT HEAD WITHOUT CONTRAST TECHNIQUE: Contiguous axial images were obtained from the base of the skull through the vertex without intravenous contrast. COMPARISON:  12/05/2011 FINDINGS: Brain: No evidence of acute infarction, hemorrhage, hydrocephalus, extra-axial collection or mass lesion/mass  effect. Moderate brain parenchymal volume loss and advanced deep white matter microangiopathy. Lacunar infarcts with chronic appearance in bilateral basal ganglia. Vascular: Calcific atherosclerotic disease of the intra cavernous carotid arteries. Skull: Normal. Negative for fracture or focal lesion. Sinuses/Orbits: No acute finding. Other: Bilateral cataract surgery. IMPRESSION: Probably chronic bilateral basal ganglia lacunar infarcts. Moderate brain parenchymal volume loss and advanced deep white matter microangiopathy. No evidence of acute intracranial hemorrhage. Electronically Signed   By: Fidela Salisbury M.D.   On: 02/20/2018 19:22    ____________________________________________   PROCEDURES  Procedure(s) performed:   Procedures  Criti  ____________________________________________   INITIAL IMPRESSION / ASSESSMENT AND PLAN / ED COURSE  ----------------------------------------- 8:45 PM on 02/20/2018 -----------------------------------------  Patient reports he is feeling better and wants to go home.  I have not been able to find anything particular I can treat.  I will have him drink a little bit of extra water as his BUN is a little high he will return if he has any further problems.        ____________________________________________   FINAL CLINICAL IMPRESSION(S) / ED DIAGNOSES  Final diagnoses:  Weakness     ED Discharge Orders    None       Note:  This document was prepared using Dragon voice recognition software and may include unintentional dictation errors.    Nena Polio, MD 02/20/18 2046

## 2018-02-20 NOTE — ED Notes (Signed)
Pt assisted to use urinal. ABCs intact. NAD. Wife at bedside. Pt and wife aware of POC.

## 2018-02-21 NOTE — Progress Notes (Signed)
Simcoe, Duard L. ([id number]) Visit Report for 02/11/2018 Chief Complaint Document Details Patient Name: [name], [name] Date of Service: 02/11/2018 1:00 PM Medical Record Number: [id number] Patient Account Number: [id number] Date of Birth/Sex: Feb 03, 1926 (84 y.o. M) Treating RN: Ahmed Prima Primary Care Provider: Ramonita Lab Other Clinician: Referring Provider: Ramonita Lab Treating Provider/Extender: Cathie Olden in Treatment: 25 Information Obtained from: Patient Chief Complaint Bilateral Heel pressure ulcers and bilateral great toe DTIs Electronic Signature(s) Signed: 02/11/2018 3:45:39 PM By: Lawanda Cousins Entered By: Lawanda Cousins on 02/11/2018 13:38:58 Langner, Matthe L. ([id number]) -------------------------------------------------------------------------------- Debridement Details Patient Name: [name], [name] Date of Service: 02/11/2018 1:00 PM Medical Record Number: [id number] Patient Account Number: [id number] Date of Birth/Sex: March 04, 1926 (84 y.o. M) Treating RN: Ahmed Prima Primary Care Provider: Ramonita Lab Other Clinician: Referring Provider: Ramonita Lab Treating Provider/Extender: Cathie Olden in Treatment: 25 Debridement Performed for Wound #1 Left Calcaneus Assessment: Performed By: Physician Lawanda Cousins, NP Debridement Type: Debridement Pre-procedure Verification/Time Yes - 13:29 Out Taken: Start Time: 13:29 Pain Control: Lidocaine 4% Topical Solution Total Area Debrided (L x W): 0.9 (cm) x 1.3 (cm) = 1.17 (cm) Tissue and other material Viable, Non-Viable, Slough, Subcutaneous, Fibrin/Exudate, Slough, Hyper-granulation debrided: Level: Skin/Subcutaneous Tissue Debridement Description: Excisional Instrument: Curette Bleeding: Minimum Hemostasis Achieved: Pressure End Time: 13:31 Procedural Pain: 0 Post Procedural Pain: 0 Response to Treatment: Procedure was tolerated well Level of Consciousness: Awake and Alert Post Debridement  Measurements of Total Wound Length: (cm) 0.9 Stage: Category/Stage III Width: (cm) 1.3 Depth: (cm) 0.3 Volume: (cm) 0.276 Character of Wound/Ulcer Post Requires Further Debridement Debridement: Post Procedure Diagnosis Same as Pre-procedure Electronic Signature(s) Signed: 02/11/2018 3:45:39 PM By: Lawanda Cousins Signed: 02/11/2018 3:52:40 PM By: Alric Quan Entered By: Lawanda Cousins on 02/11/2018 13:38:25 Smola, Eros L. ([id number]) -------------------------------------------------------------------------------- HPI Details Patient Name: [name], [name] Date of Service: 02/11/2018 1:00 PM Medical Record Number: [id number] Patient Account Number: [id number] Date of Birth/Sex: 02/28/1926 (84 y.o. M) Treating RN: Ahmed Prima Primary Care Provider: Ramonita Lab Other Clinician: Referring Provider: Ramonita Lab Treating Provider/Extender: Cathie Olden in Treatment: 25 History of Present Illness Associated Signs and Symptoms: Patient has a history of chronic atrophic relation, hypertension, and long-term use of anticoagulants. He also had a fall with pelvic fracture which occurred on January 2019. HPI Description: 08/19/17 patient presents today for initial evaluation and our clinic concerning issues he has been having with the bilateral heels as well as the lateral great toe was since he was admitted to the nursing facility following a pelvic fracture. Initially he was spending a significant amount of time in the bed although he is now up more often since the healing process has progressed along the way obviously. When he is up he's spending most of the time sitting in a wheelchair at this point. When he is in bed he lays on his back and the covers/sheets are pushing down on his toes bilaterally which I think it may be what's causing the issues with his deep tissue injury to the bilateral great toes. With that being said the hills also appear to be pressure in nature and I  think that laying in the bed getting pressure to the sites is what has led to the formation of these on stage will pressure ulcers. Fortunately there does not appear to be any infection in both areas of ulceration of the heel appear to be stable at this point. Patient does not have any significant pain at the site she does have some  of the eschar along the edges which is starting to lift up and come all hopefully slowly but surely this will occur. Patient did have arterial studies performed April 2018 which showed a normal TBI on the right knee slightly depressed TBI on the left but this does not appear to have changed significantly at that point. Specifically this was 0.75 on the right and 0.61 on the left. 09/13/17 on evaluation today patient presents for a follow-up evaluation regarding his bilateral heal ulcers. He has been tolerating the dressing changes fairly well although he did have some bleeding earlier today when it appears some of the skin on the surrounding portion of the wound got pulled where this is starting to flake off. Subsequently there does not appear to be any bleeding right now. Since I last saw him he has been discharged from the facility and is now back at home. His wife is having a very difficult time being able to get him up and in the car in order to come for an appointment which is why they missed the last appointment have not been able to come back sooner. Fortunately the he does not have any signs of infection. No fevers chills noted. He does have difficulty walking due to pain in the bilateral heels. 10/11/17 on evaluation today patient presents for follow-up evaluation concerning his ongoing bilateral heal ulcers. He has been tolerating the dressing changes which were Betadine paints daily without complication. I have not seen him since September 13, 2017. Pressure that I saw him August 19, 2017. Subsequently those are the only prior visits before today. His left  heel actually seems to have a significant amount of eschar that is starting to lift up at this point and seems to be a little more loose as far as drainage building up underneath the eschar. The right eschar though there are some parts lifting up seems to still be fairly stable which is good news. His toe ulcer is almost completely healed a lot of that eschar came off and it looks very well u 01/06/18 on evaluation today patient actually appears to be doing excellent in regard to his bilateral heal ulcers. The right heel is very close to completely closing and overall this is doing excellent. Left heel #2 great improvement as it has been for the past several weeks. I'm extremely pleased in this regard. Overall the patient is also happy to his wife seems to be doing an excellent job caring for him in my pinion. 01/14/18 on evaluation today patient actually appears to be doing better in regard to both heal ulcers. The right heel is almost completely healed there was some callous buildup around the edge of the wound but overall I feel like things are progressing quite nicely. In regard to the left heel also feel like there's good epithelialization however this has been obviously somewhat slower it was also much larger initially when the eschar was removed so again I'm not really too worried about it I think it's progressing in a good fashion. Overall I feel like the patient is doing excellent. 01/21/18 on evaluation today patient appears to actually be doing rather well in regard to his lower extremity heels. His right lower extremity actually was almost completely healed although there was a very small opening still remaining that was noted during debridement today. Nonetheless overall I feel like he is making good progress. No fevers, chills, nausea, or vomiting noted at this time. [name], [name] ([id number]) 01/28/18 on evaluation  today patient's right heel ulcer appears to be all but closed. There  still just a very small almost pinpoint opening at this point overall I'm very pleased with how things have progressed. The left ulcer is still more open although there is definitely signs of epithelialization even since last week I feel like things are improving. 02/04/18 on evaluation today patient appears to actually be doing very well in regard to his right field ulcer this appears to be completely healed in fact which is excellent news. In regard to left heel ulcer this is smaller although not completely close to yet. Nonetheless he seemed to be making great progress. 02/11/18 He is here for follow up evaluation for a left heel stage III pressure ulcer. It is stable. He continues with offloading with ambulation and with rest. We have instructed patient's wife to pack prisma into all areas and depth. they are requesting a two week follow up Electronic Signature(s) Signed: 02/11/2018 3:45:39 PM By: Lawanda Cousins Entered By: Lawanda Cousins on 02/11/2018 13:40:30 [name], [name] ([id number]) -------------------------------------------------------------------------------- Physician Orders Details Patient Name: [name], [name] Date of Service: 02/11/2018 1:00 PM Medical Record Number: [id number] Patient Account Number: [id number] Date of Birth/Sex: 17-May-1926 (84 y.o. M) Treating RN: Ahmed Prima Primary Care Provider: Ramonita Lab Other Clinician: Referring Provider: Ramonita Lab Treating Provider/Extender: Cathie Olden in Treatment: 25 Verbal / Phone Orders: Yes Clinician: Carolyne Fiscal, Debi Read Back and Verified: Yes Diagnosis Coding Wound Cleansing Wound #1 Left Calcaneus o Clean wound with Normal Saline. o Cleanse wound with mild soap and water o May Shower, gently pat wound dry prior to applying new dressing. Anesthetic (add to Medication List) Wound #1 Left Calcaneus o Topical Lidocaine 4% cream applied to wound bed prior to debridement (In Clinic Only). Skin  Barriers/Peri-Wound Care Wound #1 Left Calcaneus o Skin Prep Primary Wound Dressing Wound #1 Left Calcaneus o Silver Collagen - lightly moisten with saline Secondary Dressing Wound #1 Left Calcaneus o Boardered Foam Dressing Dressing Change Frequency Wound #1 Left Calcaneus o Change dressing every other day. Follow-up Appointments Wound #1 Left Calcaneus o Return Appointment in 2 weeks. Off-Loading Wound #1 Left Calcaneus o Turn and reposition every 2 hours o Other: - float heels at bedtime Additional Orders / Instructions Wound #1 Left Calcaneus o Increase protein intake. Patient Medications [place], Salathiel L. ([id number]) Allergies: No Known Allergies Notifications Medication Indication Start End lidocaine DOSE 1 - topical 4 % cream - 1 cream topical Electronic Signature(s) Signed: 02/11/2018 3:45:39 PM By: Lawanda Cousins Signed: 02/11/2018 3:52:40 PM By: Alric Quan Entered By: Alric Quan on 02/11/2018 13:34:31 [name], [name] L. ([id number]) -------------------------------------------------------------------------------- Prescription 02/11/2018 Patient Name: TAMAS, SUEN L. Provider: Lawanda Cousins NP Date of Birth: Oct 17, 1925 NPI#: [id number] Sex: Jerilynn Mages DEA#: [id number] Phone #: [phone] License #: Patient Address: [facility] [place], [place] [address] [address] [place],  [address] [phone] Allergies No Known Allergies Medication Medication: Route: Strength: Form: lidocaine topical 4% cream Class: TOPICAL LOCAL ANESTHETICS Dose: Frequency / Time: Indication: 1 1 cream topical Number of Refills: Number of Units: 0 Generic Substitution: Start Date: End Date: Administered at [place]: Yes Time Administered: Time Discontinued: Note to Pharmacy: Signature(s): Date(s): Electronic Signature(s) Signed:  02/11/2018 3:45:39 PM By: Lawanda Cousins Signed: 02/11/2018 3:52:40 PM By: Alric Quan Entered By: Alric Quan on 02/11/2018 13:34:31 [name], [name] ([id number]) [place], [place] ([id number]) --------------------------------------------------------------------------------  Problem List Details Patient Name: ZACHAREE, GADDIE  L. Date of Service: 02/11/2018 1:00 PM Medical Record Number: [id number] Patient Account Number: [id number] Date of Birth/Sex: 1925/08/29 (84 y.o. M) Treating RN: Ahmed Prima Primary Care Provider: Ramonita Lab Other Clinician: Referring Provider: Ramonita Lab Treating Provider/Extender: Cathie Olden in Treatment: 25 Active Problems ICD-10 Evaluated Encounter Code Description Active Date Today Diagnosis L89.623 Pressure ulcer of left heel, stage 3 08/19/2017 No Yes I48.2 Chronic atrial fibrillation 08/19/2017 No Yes I10 Essential (primary) hypertension 08/19/2017 No Yes Z79.01 Long term (current) use of anticoagulants 08/19/2017 No Yes Inactive Problems Resolved Problems ICD-10 Code Description Active Date Resolved Date L89.613 Pressure ulcer of right heel, stage 3 08/19/2017 08/19/2017 Electronic Signature(s) Signed: 02/11/2018 3:45:39 PM By: Lawanda Cousins Entered By: Lawanda Cousins on 02/11/2018 13:36:45 Khader, Aurelius L. ([id number]) -------------------------------------------------------------------------------- Progress Note Details Patient Name: [name], [name] Date of Service: 02/11/2018 1:00 PM Medical Record Number: [id number] Patient Account Number: [id number] Date of Birth/Sex: 09-24-1925 (84 y.o. M) Treating RN: Ahmed Prima Primary Care Provider: Ramonita Lab Other Clinician: Referring Provider: Ramonita Lab Treating Provider/Extender: Cathie Olden in Treatment: 25 Subjective Chief Complaint Information obtained from Patient Bilateral Heel pressure ulcers and bilateral great toe DTIs History of Present Illness (HPI) The following  HPI elements were documented for the patient's wound: Associated Signs and Symptoms: Patient has a history of chronic atrophic relation, hypertension, and long-term use of anticoagulants. He also had a fall with pelvic fracture which occurred on January 2019. 08/19/17 patient presents today for initial evaluation and our clinic concerning issues he has been having with the bilateral heels as well as the lateral great toe was since he was admitted to the nursing facility following a pelvic fracture. Initially he was spending a significant amount of time in the bed although he is now up more often since the healing process has progressed along the way obviously. When he is up he's spending most of the time sitting in a wheelchair at this point. When he is in bed he lays on his back and the covers/sheets are pushing down on his toes bilaterally which I think it may be what's causing the issues with his deep tissue injury to the bilateral great toes. With that being said the hills also appear to be pressure in nature and I think that laying in the bed getting pressure to the sites is what has led to the formation of these on stage will pressure ulcers. Fortunately there does not appear to be any infection in both areas of ulceration of the heel appear to be stable at this point. Patient does not have any significant pain at the site she does have some of the eschar along the edges which is starting to lift up and come all hopefully slowly but surely this will occur. Patient did have arterial studies performed April 2018 which showed a normal TBI on the right knee slightly depressed TBI on the left but this does not appear to have changed significantly at that point. Specifically this was 0.75 on the right and 0.61 on the left. 09/13/17 on evaluation today patient presents for a follow-up evaluation regarding his bilateral heal ulcers. He has been tolerating the dressing changes fairly well although he did  have some bleeding earlier today when it appears some of the skin on the surrounding portion of the wound got pulled where this is starting to flake off. Subsequently there does not appear to be any bleeding right now. Since I last saw him he has been discharged from the facility  and is now back at home. His wife is having a very difficult time being able to get him up and in the car in order to come for an appointment which is why they missed the last appointment have not been able to come back sooner. Fortunately the he does not have any signs of infection. No fevers chills noted. He does have difficulty walking due to pain in the bilateral heels. 10/11/17 on evaluation today patient presents for follow-up evaluation concerning his ongoing bilateral heal ulcers. He has been tolerating the dressing changes which were Betadine paints daily without complication. I have not seen him since September 13, 2017. Pressure that I saw him August 19, 2017. Subsequently those are the only prior visits before today. His left heel actually seems to have a significant amount of eschar that is starting to lift up at this point and seems to be a little more loose as far as drainage building up underneath the eschar. The right eschar though there are some parts lifting up seems to still be fairly stable which is good news. His toe ulcer is almost completely healed a lot of that eschar came off and it looks very well u 01/06/18 on evaluation today patient actually appears to be doing excellent in regard to his bilateral heal ulcers. The right heel is very close to completely closing and overall this is doing excellent. Left heel #2 great improvement as it has been for the past several weeks. I'm extremely pleased in this regard. Overall the patient is also happy to his wife seems to be doing an excellent job caring for him in my pinion. 01/14/18 on evaluation today patient actually appears to be doing better in regard to both  heal ulcers. The right heel is almost completely healed there was some callous buildup around the edge of the wound but overall I feel like things are progressing [place], [place] ([id number]) quite nicely. In regard to the left heel also feel like there's good epithelialization however this has been obviously somewhat slower it was also much larger initially when the eschar was removed so again I'm not really too worried about it I think it's progressing in a good fashion. Overall I feel like the patient is doing excellent. 01/21/18 on evaluation today patient appears to actually be doing rather well in regard to his lower extremity heels. His right lower extremity actually was almost completely healed although there was a very small opening still remaining that was noted during debridement today. Nonetheless overall I feel like he is making good progress. No fevers, chills, nausea, or vomiting noted at this time. 01/28/18 on evaluation today patient's right heel ulcer appears to be all but closed. There still just a very small almost pinpoint opening at this point overall I'm very pleased with how things have progressed. The left ulcer is still more open although there is definitely signs of epithelialization even since last week I feel like things are improving. 02/04/18 on evaluation today patient appears to actually be doing very well in regard to his right field ulcer this appears to be completely healed in fact which is excellent news. In regard to left heel ulcer this is smaller although not completely close to yet. Nonetheless he seemed to be making great progress. 02/11/18 He is here for follow up evaluation for a left heel stage III pressure ulcer. It is stable. He continues with offloading with ambulation and with rest. We have instructed patient's wife to pack prisma into  all areas and depth. they are requesting a two week follow up Objective Constitutional Vitals Time Taken: 1:15 PM,  Height: 70 in, Weight: 190 lbs, BMI: 27.3, Temperature: 97.8 F, Pulse: 104 bpm, Respiratory Rate: 16 breaths/min, Blood Pressure: 128/70 mmHg. Integumentary (Hair, Skin) Wound #1 status is Open. Original cause of wound was Pressure Injury. The wound is located on the Left Calcaneus. The wound measures 0.9cm length x 1.3cm width x 0.2cm depth; 0.919cm^2 area and 0.184cm^3 volume. There is Fat Layer (Subcutaneous Tissue) Exposed exposed. There is no tunneling or undermining noted. There is a large amount of purulent drainage noted. Foul odor after cleansing was noted. The wound margin is distinct with the outline attached to the wound base. There is medium (34-66%) pink granulation within the wound bed. There is a medium (34-66%) amount of necrotic tissue within the wound bed including Adherent Slough. The periwound skin appearance had no abnormalities noted for color. The periwound skin appearance exhibited: Excoriation. The periwound skin appearance did not exhibit: Callus, Crepitus, Induration, Rash, Scarring. Periwound temperature was noted as No Abnormality. The periwound has tenderness on palpation. Assessment Active Problems ICD-10 Pressure ulcer of left heel, stage 3 Chronic atrial fibrillation Essential (primary) hypertension Long term (current) use of anticoagulants [name], [name] ([id number]) Procedures Wound #1 Pre-procedure diagnosis of Wound #1 is a Pressure Ulcer located on the Left Calcaneus . There was a Excisional Skin/Subcutaneous Tissue Debridement with a total area of 1.17 sq cm performed by Lawanda Cousins, NP. With the following instrument(s): Curette to remove Viable and Non-Viable tissue/material. Material removed includes Subcutaneous Tissue, Slough, Fibrin/Exudate, and Hyper-granulation after achieving pain control using Lidocaine 4% Topical Solution. No specimens were taken. A time out was conducted at 13:29, prior to the start of the procedure. A Minimum amount  of bleeding was controlled with Pressure. The procedure was tolerated well with a pain level of 0 throughout and a pain level of 0 following the procedure. Patient s Level of Consciousness post procedure was recorded as Awake and Alert. Post Debridement Measurements: 0.9cm length x 1.3cm width x 0.3cm depth; 0.276cm^3 volume. Post debridement Stage noted as Category/Stage III. Character of Wound/Ulcer Post Debridement requires further debridement. Post procedure Diagnosis Wound #1: Same as Pre-Procedure Plan Wound Cleansing: Wound #1 Left Calcaneus: Clean wound with Normal Saline. Cleanse wound with mild soap and water May Shower, gently pat wound dry prior to applying new dressing. Anesthetic (add to Medication List): Wound #1 Left Calcaneus: Topical Lidocaine 4% cream applied to wound bed prior to debridement (In Clinic Only). Skin Barriers/Peri-Wound Care: Wound #1 Left Calcaneus: Skin Prep Primary Wound Dressing: Wound #1 Left Calcaneus: Silver Collagen - lightly moisten with saline Secondary Dressing: Wound #1 Left Calcaneus: Boardered Foam Dressing Dressing Change Frequency: Wound #1 Left Calcaneus: Change dressing every other day. Follow-up Appointments: Wound #1 Left Calcaneus: Return Appointment in 2 weeks. Off-Loading: Wound #1 Left Calcaneus: Turn and reposition every 2 hours Other: - float heels at bedtime Additional Orders / Instructions: Wound #1 Left Calcaneus: Increase protein intake. [name], Yoshua L. ([id number]) The following medication(s) was prescribed: lidocaine topical 4 % cream 1 1 cream topical was prescribed at facility Electronic Signature(s) Signed: 02/11/2018 3:45:39 PM By: Lawanda Cousins Entered By: Lawanda Cousins on 02/11/2018 13:40:48 Quirarte, Trevionne L. ([id number]) -------------------------------------------------------------------------------- SuperBill Details Patient Name: DORITY, Starsky L. Date of Service: 02/11/2018 Medical Record Number:  [id number] Patient Account Number: [id number] Date of Birth/Sex: 12/27/1925 (84 y.o. M) Treating RN: Ahmed Prima Primary Care Provider: Ramonita Lab Other Clinician:  Referring Provider: Ramonita Lab Treating Provider/Extender: Lawanda Cousins Weeks in Treatment: 25 Diagnosis Coding ICD-10 Codes Code Description [phone] Pressure ulcer of left heel, stage 3 I48.2 Chronic atrial fibrillation I10 Essential (primary) hypertension Z79.01 Long term (current) use of anticoagulants Facility Procedures CPT4 Code: [id number] Description: 14970 - DEB SUBQ TISSUE 20 SQ CM/< ICD-10 Diagnosis Description L89.623 Pressure ulcer of left heel, stage 3 Modifier: Quantity: 1 Physician Procedures CPT4 Code: [id number] Description: 85027 - WC PHYS SUBQ TISS 20 SQ CM ICD-10 Diagnosis Description L89.623 Pressure ulcer of left heel, stage 3 Modifier: Quantity: 1 Electronic Signature(s) Signed: 02/11/2018 3:45:39 PM By: Lawanda Cousins Entered By: Lawanda Cousins on 02/11/2018 13:40:58

## 2018-02-21 NOTE — Progress Notes (Signed)
[name] ([id number]) Visit Report for 02/11/2018 Arrival Information Details Patient Name: [name], [name] Date of Service: 02/11/2018 1:00 PM Medical Record Number: [id number] Patient Account Number: [id number] Date of Birth/Sex: 24-Jul-1925 (84 y.o. M) Treating RN: Montey Hora Primary Care Ly Wass: Ramonita Lab Other Clinician: Referring Jakiah Bienaime: Ramonita Lab Treating Davionne Dowty/Extender: Cathie Olden in Treatment: 25 Visit Information History Since Last Visit Added or deleted any medications: No Patient Arrived: Walker Any new allergies or adverse reactions: No Arrival Time: 13:10 Had a fall or experienced change in No Accompanied By: spouse activities of daily living that may affect Transfer Assistance: None risk of falls: Patient Identification Verified: Yes Signs or symptoms of abuse/neglect since last visito No Secondary Verification Process Yes Hospitalized since last visit: No Completed: Implantable device outside of the clinic excluding No Patient Requires Transmission-Based No cellular tissue based products placed in the center Precautions: since last visit: Patient Has Alerts: Yes Has Dressing in Place as Prescribed: Yes Patient Alerts: Patient on Blood Pain Present Now: No Thinner Warfarin Electronic Signature(s) Signed: 02/11/2018 4:05:36 PM By: Montey Hora Entered By: Montey Hora on 02/11/2018 13:11:06 [name] ([id number]) -------------------------------------------------------------------------------- Encounter Discharge Information Details Patient Name: [name], [name] Date of Service: 02/11/2018 1:00 PM Medical Record Number: [id number] Patient Account Number: [id number] Date of Birth/Sex: 1926-03-31 (84 y.o. M) Treating RN: Roger Shelter Primary Care Sat[place] Parco: Ramonita Lab Other Clinician: Referring Karder Goodin: Ramonita Lab Treating Ngina Royer/Extender: Cathie Olden in Treatment: 25 Encounter Discharge Information  Items Discharge Condition: Stable Ambulatory Status: Walker Discharge Destination: Home Transportation: Private Auto Schedule Follow-up Appointment: Yes Clinical Summary of Care: Electronic Signature(s) Signed: 02/11/2018 3:57:43 PM By: Roger Shelter Entered By: Roger Shelter on 02/11/2018 13:41:53 [name] ([id number]) -------------------------------------------------------------------------------- Lower Extremity Assessment Details Patient Name: [name], [name] Date of Service: 02/11/2018 1:00 PM Medical Record Number: [id number] Patient Account Number: [id number] Date of Birth/Sex: Mar 28, 1926 (84 y.o. M) Treating RN: Montey Hora Primary Care Coulson Wehner: Ramonita Lab Other Clinician: Referring Nashid Pellum: Ramonita Lab Treating Malyk Girouard/Extender: Cathie Olden in Treatment: 25 Vascular Assessment Pulses: Dorsalis Pedis Palpable: [Left:Yes] Posterior Tibial Extremity colors, hair growth, and conditions: Extremity Color: [Left:Normal] Hair Growth on Extremity: [Left:No] Temperature of Extremity: [Left:Warm] Capillary Refill: [Left:< 3 seconds] Toe Nail Assessment Left: Right: Thick: Yes Discolored: Yes Deformed: No Improper Length and Hygiene: Yes Electronic Signature(s) Signed: 02/11/2018 4:05:36 PM By: Montey Hora Entered By: Montey Hora on 02/11/2018 13:19:29 [name] ([id number]) -------------------------------------------------------------------------------- Multi Wound Chart Details Patient Name: [name], [name] Date of Service: 02/11/2018 1:00 PM Medical Record Number: [id number] Patient Account Number: [id number] Date of Birth/Sex: 10-14-1925 (84 y.o. M) Treating RN: Ahmed Prima Primary Care Kimberl Vig: Ramonita Lab Other Clinician: Referring Adea Geisel: Ramonita Lab Treating Tinzley Dalia/Extender: Cathie Olden in Treatment: 25 Vital Signs Height(in): 70 Pulse(bpm): 104 Weight(lbs): 190 Blood Pressure(mmHg): 128/70 Body Mass  Index(BMI): 27 Temperature(F): 97.8 Respiratory Rate 16 (breaths/min): Photos: [1:No Photos] [N/A:N/A] Wound Location: [1:Left Calcaneus] [N/A:N/A] Wounding Event: [1:Pressure Injury] [N/A:N/A] Primary Etiology: [1:Pressure Ulcer] [N/A:N/A] Comorbid History: [1:Arrhythmia, Hypertension, Received Radiation] [N/A:N/A] Date Acquired: [1:08/12/2017] [N/A:N/A] Weeks of Treatment: [1:25] [N/A:N/A] Wound Status: [1:Open] [N/A:N/A] Measurements [name] [1:0.9x1.3x0.2] [N/A:N/A] (cm) Area (cm) : [1:0.919] [N/A:N/A] Volume (cm) : [1:0.184] [N/A:N/A] % Reduction in Area: [1:96.00%] [N/A:N/A] % Reduction in Volume: [1:96.00%] [N/A:N/A] Classification: [1:Category/Stage III] [N/A:N/A] Exudate Amount: [1:Large] [N/A:N/A] Exudate Type: [1:Purulent] [N/A:N/A] Exudate Color: [1:yellow, brown, green] [N/A:N/A] Foul Odor After Cleansing: [1:Yes] [N/A:N/A] Odor Anticipated Due to [1:No] [N/A:N/A] Product Use: Wound Margin: [1:Distinct,  outline attached] [N/A:N/A] Granulation Amount: [1:Medium (34-66%)] [N/A:N/A] Granulation Quality: [1:Pink] [N/A:N/A] Necrotic Amount: [1:Medium (34-66%)] [N/A:N/A] Exposed Structures: [1:Fat Layer (Subcutaneous Tissue) Exposed: Yes Fascia: No Tendon: No Muscle: No Joint: No Bone: No] [N/A:N/A] Epithelialization: [1:None] [N/A:N/A] Debridement: [1:Debridement - Excisional] [N/A:N/A] Pre-procedure [1:13:29] [N/A:N/A] Verification/Time Out Taken: [name], [name] ([id number]) Pain Control: Lidocaine 4% Topical Solution N/A N/A Tissue Debrided: Subcutaneous, Slough N/A N/A Level: Skin/Subcutaneous Tissue N/A N/A Debridement Area (sq cm): 1.17 N/A N/A Instrument: Curette N/A N/A Bleeding: Minimum N/A N/A Hemostasis Achieved: Pressure N/A N/A Procedural Pain: 0 N/A N/A Post Procedural Pain: 0 N/A N/A Debridement Treatment Procedure was tolerated well N/A N/A Response: Post Debridement 0.9x1.3x0.3 N/A N/A Measurements [name] (cm) Post Debridement  Volume: 0.276 N/A N/A (cm) Post Debridement Stage: Category/Stage III N/A N/A Periwound Skin Texture: Excoriation: Yes N/A N/A Induration: No Callus: No Crepitus: No Rash: No Scarring: No Periwound Skin Moisture: No Abnormalities Noted N/A N/A Periwound Skin Color: No Abnormalities Noted N/A N/A Temperature: No Abnormality N/A N/A Tenderness on Palpation: Yes N/A N/A Wound Preparation: Ulcer Cleansing: N/A N/A Rinsed/Irrigated with Saline Topical Anesthetic Applied: Other: lidocaine 4% Procedures Performed: Debridement N/A N/A Treatment Notes Electronic Signature(s) Signed: 02/11/2018 3:45:39 PM By: Lawanda Cousins Entered By: Lawanda Cousins on 02/11/2018 13:37:02 [name], [name] ([id number]) -------------------------------------------------------------------------------- [place] Details Patient Name: [name], [name] Date of Service: 02/11/2018 1:00 PM Medical Record Number: [id number] Patient Account Number: [id number] Date of Birth/Sex: Aug 01, 1925 (84 y.o. M) Treating RN: Ahmed Prima Primary Care Danyl Deems: Ramonita Lab Other Clinician: Referring Akia Montalban: Ramonita Lab Treating Andrell Bergeson/Extender: Cathie Olden in Treatment: 25 Active Inactive ` Abuse / Safety / Falls / Self Care Management Nursing Diagnoses: History of Falls Potential for falls Goals: Patient will not experience any injury related to falls Date Initiated: 08/19/2017 Target Resolution Date: 12/14/2017 Goal Status: Active Interventions: Assess Activities of Daily Living upon admission and as needed Assess fall risk on admission and as needed Assess: immobility, friction, shearing, incontinence upon admission and as needed Assess impairment of mobility on admission and as needed per policy Assess personal safety and home safety (as indicated) on admission and as needed Assess self care needs on admission and as needed Notes: ` Nutrition Nursing Diagnoses: Imbalanced  nutrition Potential for alteratiion in Nutrition/Potential for imbalanced nutrition Goals: Patient/caregiver agrees to and verbalizes understanding of need to use nutritional supplements and/or vitamins as prescribed Date Initiated: 08/19/2017 Target Resolution Date: 11/16/2017 Goal Status: Active Interventions: Assess patient nutrition upon admission and as needed per policy Notes: ` Orientation to the Wound Care Program Nursing Diagnoses: [name], [name] ([id number]) Knowledge deficit related to the wound healing center program Goals: Patient/caregiver will verbalize understanding of the [place] Date Initiated: 08/19/2017 Target Resolution Date: 09/14/2017 Goal Status: Active Interventions: Provide education on orientation to the wound center Notes: ` Pain, Acute or Chronic Nursing Diagnoses: Pain, acute or chronic: actual or potential Potential alteration in comfort, pain Goals: Patient/caregiver will verbalize adequate pain control between visits Date Initiated: 08/19/2017 Target Resolution Date: 12/14/2017 Goal Status: Active Interventions: Complete pain assessment as per visit requirements Notes: ` Pressure Nursing Diagnoses: Knowledge deficit related to causes and risk factors for pressure ulcer development Knowledge deficit related to management of pressures ulcers Potential for impaired tissue integrity related to pressure, friction, moisture, and shear Goals: Patient will remain free from development of additional pressure ulcers Date Initiated: 08/19/2017 Target Resolution Date: 12/14/2017 Goal Status: Active Interventions: Assess: immobility, friction, shearing, incontinence  upon admission and as needed Assess potential for pressure ulcer upon admission and as needed Notes: ` Wound/Skin Impairment Nursing Diagnoses: Impaired tissue integrity Knowledge deficit related to ulceration/compromised skin integrity [name], [name]  ([id number]) Goals: Ulcer/skin breakdown will have a volume reduction of 80% by week 12 Date Initiated: 08/19/2017 Target Resolution Date: 12/14/2017 Goal Status: Active Interventions: Assess patient/caregiver ability to perform ulcer/skin care regimen upon admission and as needed Assess ulceration(s) every visit Notes: Electronic Signature(s) Signed: 02/11/2018 3:52:40 PM By: Alric Quan Entered By: Alric Quan on 02/11/2018 13:28:36 [name], [name] ([id number]) -------------------------------------------------------------------------------- Pain Assessment Details Patient Name: [name], [name] Date of Service: 02/11/2018 1:00 PM Medical Record Number: [id number] Patient Account Number: [id number] Date of Birth/Sex: 05/21/1926 (84 y.o. M) Treating RN: Montey Hora Primary Care Itha Kroeker: Ramonita Lab Other Clinician: Referring Anhthu Perdew: Ramonita Lab Treating Romanda Turrubiates/Extender: Cathie Olden in Treatment: 25 Active Problems Location of Pain Severity and Description of Pain Patient Has Paino No Site Locations Pain Management and Medication Current Pain Management: Electronic Signature(s) Signed: 02/11/2018 4:05:36 PM By: Montey Hora Entered By: Montey Hora on 02/11/2018 13:11:12 [name], [name] ([id number]) -------------------------------------------------------------------------------- Patient/Caregiver Education Details Patient Name: [name], [name] Date of Service: 02/11/2018 1:00 PM Medical Record Number: [id number] Patient Account Number: [id number] Date of Birth/Gender: 09/06/1925 (84 y.o. M) Treating RN: Roger Shelter Primary Care Physician: Ramonita Lab Other Clinician: Referring Physician: Ramonita Lab Treating Physician/Extender: Cathie Olden in Treatment: 25 Education Assessment Education Provided To: Patient Education Topics Provided Wound Debridement: Handouts: Wound Debridement Methods: Explain/Verbal Responses: State content  correctly Wound/Skin Impairment: Handouts: Caring for Your Ulcer Methods: Explain/Verbal Responses: State content correctly Electronic Signature(s) Signed: 02/11/2018 3:57:43 PM By: Roger Shelter Entered By: Roger Shelter on 02/11/2018 13:42:07 Rivet, Kalyn [name]. ([id number]) -------------------------------------------------------------------------------- Wound Assessment Details Patient Name: HINDERLITER, Eliaz [name]. Date of Service: 02/11/2018 1:00 PM Medical Record Number: [id number] Patient Account Number: [id number] Date of Birth/Sex: 1926/06/24 (84 y.o. M) Treating RN: Montey Hora Primary Care Latajah Thuman: Ramonita Lab Other Clinician: Referring Bekah Igoe: Ramonita Lab Treating Vitalia Stough/Extender: Lawanda Cousins Weeks in Treatment: 25 Wound Status Wound Number: 1 Primary Etiology: Pressure Ulcer Wound Location: Left Calcaneus Wound Status: Open Wounding Event: Pressure Injury Comorbid Arrhythmia, Hypertension, Received History: Radiation Date Acquired: 08/12/2017 Weeks Of Treatment: 25 Clustered Wound: No Photos Photo Uploaded By: Montey Hora on 02/11/2018 14:27:00 Wound Measurements Length: (cm) 0.9 Width: (cm) 1.3 Depth: (cm) 0.2 Area: (cm) 0.919 Volume: (cm) 0.184 % Reduction in Area: 96% % Reduction in Volume: 96% Epithelialization: None Tunneling: No Undermining: No Wound Description Classification: Category/Stage III Wound Margin: Distinct, outline attached Exudate Amount: Large Exudate Type: Purulent Exudate Color: yellow, brown, green Foul Odor After Cleansing: Yes Due to Product Use: No Slough/Fibrino Yes Wound Bed Granulation Amount: Medium (34-66%) Exposed Structure Granulation Quality: Pink Fascia Exposed: No Necrotic Amount: Medium (34-66%) Fat Layer (Subcutaneous Tissue) Exposed: Yes Necrotic Quality: Adherent Slough Tendon Exposed: No Muscle Exposed: No Joint Exposed: No Bone Exposed: No Periwound Skin Texture Eutsler, Gualberto [name].  ([id number]) Texture Color No Abnormalities Noted: No No Abnormalities Noted: Yes Callus: No Temperature / Pain Crepitus: No Temperature: No Abnormality Excoriation: Yes Tenderness on Palpation: Yes Induration: No Rash: No Scarring: No Moisture No Abnormalities Noted: No Wound Preparation Ulcer Cleansing: Rinsed/Irrigated with Saline Topical Anesthetic Applied: Other: lidocaine 4%, Treatment Notes Wound #1 (Left Calcaneus) 1. Cleansed with: Clean wound with Normal Saline 2. Anesthetic Topical Lidocaine 4% cream to wound bed prior to debridement 4. Dressing Applied: Prisma Ag 5. Secondary Dressing Applied Bordered Foam  Dressing Electronic Signature(s) Signed: 02/11/2018 4:05:36 PM By: Montey Hora Entered By: Montey Hora on 02/11/2018 13:19:01 [place], [place] ([id number]) -------------------------------------------------------------------------------- Vitals Details Patient Name: MACAULEY, Lavoris [name]. Date of Service: 02/11/2018 1:00 PM Medical Record Number: [id number] Patient Account Number: [id number] Date of Birth/Sex: 11-16-1925 (84 y.o. M) Treating RN: Montey Hora Primary Care Radames Mejorado: Ramonita Lab Other Clinician: Referring Bekka Qian: Ramonita Lab Treating Decklyn Hornik/Extender: Cathie Olden in Treatment: 25 Vital Signs Time Taken: 13:15 Temperature (F): 97.8 Height (in): 70 Pulse (bpm): 104 Weight (lbs): 190 Respiratory Rate (breaths/min): 16 Body Mass Index (BMI): 27.3 Blood Pressure (mmHg): 128/70 Reference Range: 80 - 120 mg / dl Electronic Signature(s) Signed: 02/11/2018 4:05:36 PM By: Montey Hora Entered By: Montey Hora on 02/11/2018 13:15:34

## 2018-02-21 NOTE — Progress Notes (Signed)
Leblond, Bryer L. ([id number]) Visit Report for 01/28/2018 Arrival Information Details Patient Name: AKSHITH, MONCUS. Date of Service: 01/28/2018 1:30 PM Medical Record Number: [id number] Patient Account Number: [id number] Date of Birth/Sex: 1926-03-25 (84 y.o. M) Treating RN: Montey Hora Primary Care Yatziri Wainwright: Ramonita Lab Other Clinician: Referring Zaylin Pistilli: Ramonita Lab Treating Ryot Burrous/Extender: Melburn Hake, HOYT Weeks in Treatment: 23 Visit Information History Since Last Visit Added or deleted any medications: No Patient Arrived: Walker Any new allergies or adverse reactions: No Arrival Time: 13:40 Had a fall or experienced change in No Accompanied By: spouse activities of daily living that may affect Transfer Assistance: None risk of falls: Patient Identification Verified: Yes Signs or symptoms of abuse/neglect since last visito No Secondary Verification Process Yes Hospitalized since last visit: No Completed: Implantable device outside of the clinic excluding No Patient Requires Transmission-Based No cellular tissue based products placed in the center Precautions: since last visit: Patient Has Alerts: Yes Has Dressing in Place as Prescribed: Yes Patient Alerts: Patient on Blood Pain Present Now: No Thinner Warfarin Electronic Signature(s) Signed: 02/03/2018 5:45:45 PM By: Montey Hora Entered By: Montey Hora on 01/28/2018 13:41:19 Guimaraes, Kaiyden L. ([id number]) -------------------------------------------------------------------------------- Encounter Discharge Information Details Patient Name: EDDIE, PAYETTE. Date of Service: 01/28/2018 1:30 PM Medical Record Number: [id number] Patient Account Number: [id number] Date of Birth/Sex: [birth year] (84 y.o. M) Treating RN: Roger Shelter Primary Care Caitlen Worth: Ramonita Lab Other Clinician: Referring Timiko Offutt: Ramonita Lab Treating Emet Rafanan/Extender: Melburn Hake, HOYT Weeks in Treatment: 34 Encounter Discharge  Information Items Discharge Condition: Stable Ambulatory Status: Walker Discharge Destination: Home Transportation: Private Auto Schedule Follow-up Appointment: Yes Clinical Summary of Care: Electronic Signature(s) Signed: 02/11/2018 3:58:17 PM By: Roger Shelter Entered By: Roger Shelter on 01/28/2018 14:25:03 Gaba, Bentlie L. ([id number]) -------------------------------------------------------------------------------- Lower Extremity Assessment Details Patient Name: Sager, Khyre L. Date of Service: 01/28/2018 1:30 PM Medical Record Number: [id number] Patient Account Number: [id number] Date of Birth/Sex: Dec 07, 1925 (84 y.o. M) Treating RN: Montey Hora Primary Care Loni Delbridge: Ramonita Lab Other Clinician: Referring Kayin Osment: Ramonita Lab Treating Alphonsa Brickle/Extender: Melburn Hake, HOYT Weeks in Treatment: 23 Vascular Assessment Pulses: Dorsalis Pedis Palpable: [Left:Yes] [Right:Yes] Posterior Tibial Extremity colors, hair growth, and conditions: Extremity Color: [Left:Normal] [Right:Normal] Hair Growth on Extremity: [Left:No] [Right:No] Temperature of Extremity: [Left:Warm] [Right:Warm] Capillary Refill: [Left:< 3 seconds] [Right:< 3 seconds] Toe Nail Assessment Left: Right: Thick: Yes Yes Discolored: Yes Yes Deformed: No No Improper Length and Hygiene: Yes Yes Electronic Signature(s) Signed: 01/28/2018 1:50:17 PM By: Montey Hora Entered By: Montey Hora on 01/28/2018 13:50:17 Elrod, Rayen L. ([id number]) -------------------------------------------------------------------------------- Multi Wound Chart Details Patient Name: THIELEN, Brenden L. Date of Service: 01/28/2018 1:30 PM Medical Record Number: [id number] Patient Account Number: [id number] Date of Birth/Sex: [birth year] (84 y.o. M) Treating RN: Montey Hora Primary Care Sherri Levenhagen: Ramonita Lab Other Clinician: Referring Ginnette Gates: Ramonita Lab Treating Tylasia Fletchall/Extender: Melburn Hake, HOYT Weeks in Treatment: 23 Vital  Signs Height(in): 70 Pulse(bpm): 60 Weight(lbs): 190 Blood Pressure(mmHg): 128/63 Body Mass Index(BMI): 27 Temperature(F): 97.6 Respiratory Rate 16 (breaths/min): Photos: [1:No Photos] [2:No Photos] [N/A:N/A] Wound Location: [1:Left Calcaneus] [2:Right Calcaneus] [N/A:N/A] Wounding Event: [1:Pressure Injury] [2:Pressure Injury] [N/A:N/A] Primary Etiology: [1:Pressure Ulcer] [2:Pressure Ulcer] [N/A:N/A] Comorbid History: [1:Arrhythmia, Hypertension, Received Radiation] [2:Arrhythmia, Hypertension, Received Radiation] [N/A:N/A] Date Acquired: [1:08/12/2017] [2:08/12/2017] [N/A:N/A] Weeks of Treatment: [1:23] [2:23] [N/A:N/A] Wound Status: [1:Open] [2:Open] [N/A:N/A] Measurements L x W x D [1:1.1x1.8x0.1] [2:0.1x0.1x0.1] [N/A:N/A] (cm) Area (cm) : [1:1.555] [2:0.008] [N/A:N/A] Volume (cm) : [1:0.156] [2:0.001] [N/A:N/A] % Reduction in Area: [1:93.20%] [2:99.90%] [N/A:N/A] % Reduction in Volume: [1:96.60%] [2:100.00%] [N/A:N/A]  Classification: [1:Category/Stage III] [2:Category/Stage III] [N/A:N/A] Exudate Amount: [1:Large] [2:None Present] [N/A:N/A] Exudate Type: [1:Purulent] [2:N/A] [N/A:N/A] Exudate Color: [1:yellow, brown, green] [2:N/A] [N/A:N/A] Foul Odor After Cleansing: [1:Yes] [2:No] [N/A:N/A] Odor Anticipated Due to [1:No] [2:N/A] [N/A:N/A] Product Use: Wound Margin: [1:Distinct, outline attached] [2:Distinct, outline attached] [N/A:N/A] Granulation Amount: [1:Large (67-100%)] [2:None Present (0%)] [N/A:N/A] Granulation Quality: [1:Pink] [2:N/A] [N/A:N/A] Necrotic Amount: [1:Small (1-33%)] [2:None Present (0%)] [N/A:N/A] Exposed Structures: [1:Fat Layer (Subcutaneous Tissue) Exposed: Yes Fascia: No Tendon: No Muscle: No Joint: No Bone: No] [2:Fat Layer (Subcutaneous Tissue) Exposed: Yes Fascia: No Tendon: No Muscle: No Joint: No Bone: No] [N/A:N/A] Epithelialization: [1:None] [2:Large (67-100%)] [N/A:N/A] Periwound Skin Texture: [1:Excoriation: Yes Induration: No  Callus: No] [2:Excoriation: No Induration: No Callus: No] [N/A:N/A] Crepitus: No Crepitus: No Rash: No Rash: No Scarring: No Scarring: No Periwound Skin Moisture: No Abnormalities Noted Maceration: No N/A Dry/Scaly: No Periwound Skin Color: No Abnormalities Noted Atrophie Blanche: No N/A Cyanosis: No Ecchymosis: No Erythema: No Hemosiderin Staining: No Mottled: No Pallor: No Rubor: No Temperature: No Abnormality No Abnormality N/A Tenderness on Palpation: Yes Yes N/A Wound Preparation: Ulcer Cleansing: Ulcer Cleansing: N/A Rinsed/Irrigated with Saline Rinsed/Irrigated with Saline Topical Anesthetic Applied: Topical Anesthetic Applied: Other: lidocaine 4% Other: lidocaine 4% Treatment Notes Electronic Signature(s) Signed: 02/03/2018 5:45:45 PM By: Montey Hora Entered By: Montey Hora on 01/28/2018 14:05:05 Sedlar, Azarian L. ([id number]) -------------------------------------------------------------------------------- [place] Details Patient Name: SABINO, DENNING. Date of Service: 01/28/2018 1:30 PM Medical Record Number: [id number] Patient Account Number: [id number] Date of Birth/Sex: [birth year] (84 y.o. M) Treating RN: Montey Hora Primary Care Natash Berman: Ramonita Lab Other Clinician: Referring Courtez Twaddle: Ramonita Lab Treating Zalyn Amend/Extender: Melburn Hake, HOYT Weeks in Treatment: 95 Active Inactive ` Abuse / Safety / Falls / Self Care Management Nursing Diagnoses: History of Falls Potential for falls Goals: Patient will not experience any injury related to falls Date Initiated: 08/19/2017 Target Resolution Date: 12/14/2017 Goal Status: Active Interventions: Assess Activities of Daily Living upon admission and as needed Assess fall risk on admission and as needed Assess: immobility, friction, shearing, incontinence upon admission and as needed Assess impairment of mobility on admission and as needed per policy Assess personal safety and home  safety (as indicated) on admission and as needed Assess self care needs on admission and as needed Notes: ` Nutrition Nursing Diagnoses: Imbalanced nutrition Potential for alteratiion in Nutrition/Potential for imbalanced nutrition Goals: Patient/caregiver agrees to and verbalizes understanding of need to use nutritional supplements and/or vitamins as prescribed Date Initiated: 08/19/2017 Target Resolution Date: 11/16/2017 Goal Status: Active Interventions: Assess patient nutrition upon admission and as needed per policy Notes: ` Orientation to the Wound Care Program Nursing Diagnoses: Janusz, Evertte L. ([id number]) Knowledge deficit related to the wound healing center program Goals: Patient/caregiver will verbalize understanding of the [facility] Date Initiated: 08/19/2017 Target Resolution Date: 09/14/2017 Goal Status: Active Interventions: Provide education on orientation to the wound center Notes: ` Pain, Acute or Chronic Nursing Diagnoses: Pain, acute or chronic: actual or potential Potential alteration in comfort, pain Goals: Patient/caregiver will verbalize adequate pain control between visits Date Initiated: 08/19/2017 Target Resolution Date: 12/14/2017 Goal Status: Active Interventions: Complete pain assessment as per visit requirements Notes: ` Pressure Nursing Diagnoses: Knowledge deficit related to causes and risk factors for pressure ulcer development Knowledge deficit related to management of pressures ulcers Potential for impaired tissue integrity related to pressure, friction, moisture, and shear Goals: Patient will remain free from development of additional pressure ulcers Date Initiated: 08/19/2017 Target Resolution Date: 12/14/2017  Goal Status: Active Interventions: Assess: immobility, friction, shearing, incontinence upon admission and as needed Assess potential for pressure ulcer upon admission and as needed Notes: ` Wound/Skin  Impairment Nursing Diagnoses: Impaired tissue integrity Knowledge deficit related to ulceration/compromised skin integrity Driscoll, Aria L. ([id number]) Goals: Ulcer/skin breakdown will have a volume reduction of 80% by week 12 Date Initiated: 08/19/2017 Target Resolution Date: 12/14/2017 Goal Status: Active Interventions: Assess patient/caregiver ability to perform ulcer/skin care regimen upon admission and as needed Assess ulceration(s) every visit Notes: Electronic Signature(s) Signed: 02/03/2018 5:45:45 PM By: Montey Hora Entered By: Montey Hora on 01/28/2018 14:04:58 Lupien, Javier L. ([id number]) -------------------------------------------------------------------------------- Pain Assessment Details Patient Name: KROPP, Lasaro L. Date of Service: 01/28/2018 1:30 PM Medical Record Number: [id number] Patient Account Number: [id number] Date of Birth/Sex: [birth year] (84 y.o. M) Treating RN: Montey Hora Primary Care Janyra Barillas: Ramonita Lab Other Clinician: Referring Cristela Stalder: Ramonita Lab Treating Paxton Kanaan/Extender: Melburn Hake, HOYT Weeks in Treatment: 23 Active Problems Location of Pain Severity and Description of Pain Patient Has Paino No Site Locations Pain Management and Medication Current Pain Management: Electronic Signature(s) Signed: 02/03/2018 5:45:45 PM By: Montey Hora Entered By: Montey Hora on 01/28/2018 13:41:27 Mcauliff, Selby L. ([id number]) -------------------------------------------------------------------------------- Patient/Caregiver Education Details Patient Name: LATTIE, CERVI L. Date of Service: 01/28/2018 1:30 PM Medical Record Number: [id number] Patient Account Number: [id number] Date of Birth/Gender: 1926/04/02 (84 y.o. M) Treating RN: Roger Shelter Primary Care Physician: Ramonita Lab Other Clinician: Referring Physician: Ramonita Lab Treating Physician/Extender: Sharalyn Ink in Treatment: 34 Education Assessment Education Provided  To: Patient Education Topics Provided Wound Debridement: Handouts: Wound Debridement Methods: Explain/Verbal Responses: State content correctly Wound/Skin Impairment: Handouts: Caring for Your Ulcer Methods: Explain/Verbal Responses: State content correctly Electronic Signature(s) Signed: 02/11/2018 3:58:17 PM By: Roger Shelter Entered By: Roger Shelter on 01/28/2018 14:25:21 Kushner, Niam L. ([id number]) -------------------------------------------------------------------------------- Wound Assessment Details Patient Name: AGOSTINO, Tajay L. Date of Service: 01/28/2018 1:30 PM Medical Record Number: [id number] Patient Account Number: [id number] Date of Birth/Sex: 1926-06-21 (84 y.o. M) Treating RN: Montey Hora Primary Care Djuana Littleton: Ramonita Lab Other Clinician: Referring Carmon Brigandi: Ramonita Lab Treating Hamid Brookens/Extender: Melburn Hake, HOYT Weeks in Treatment: 23 Wound Status Wound Number: 1 Primary Etiology: Pressure Ulcer Wound Location: Left Calcaneus Wound Status: Open Wounding Event: Pressure Injury Comorbid Arrhythmia, Hypertension, Received History: Radiation Date Acquired: 08/12/2017 Weeks Of Treatment: 23 Clustered Wound: No Photos Photo Uploaded By: Montey Hora on 01/28/2018 16:13:02 Wound Measurements Length: (cm) 1.1 Width: (cm) 1.8 Depth: (cm) 0.1 Area: (cm) 1.555 Volume: (cm) 0.156 % Reduction in Area: 93.2% % Reduction in Volume: 96.6% Epithelialization: None Tunneling: No Undermining: No Wound Description Classification: Category/Stage III Wound Margin: Distinct, outline attached Exudate Amount: Large Exudate Type: Purulent Exudate Color: yellow, brown, green Foul Odor After Cleansing: Yes Due to Product Use: No Slough/Fibrino Yes Wound Bed Granulation Amount: Large (67-100%) Exposed Structure Granulation Quality: Pink Fascia Exposed: No Necrotic Amount: Small (1-33%) Fat Layer (Subcutaneous Tissue) Exposed: Yes Necrotic Quality:  Adherent Slough Tendon Exposed: No Muscle Exposed: No Joint Exposed: No Bone Exposed: No Periwound Skin Texture Andonian, Sephiroth L. ([id number]) Texture Color No Abnormalities Noted: No No Abnormalities Noted: Yes Callus: No Temperature / Pain Crepitus: No Temperature: No Abnormality Excoriation: Yes Tenderness on Palpation: Yes Induration: No Rash: No Scarring: No Moisture No Abnormalities Noted: No Wound Preparation Ulcer Cleansing: Rinsed/Irrigated with Saline Topical Anesthetic Applied: Other: lidocaine 4%, Electronic Signature(s) Signed: 02/03/2018 5:45:45 PM By: Montey Hora Entered By: Montey Hora on 01/28/2018 13:48:37 Otten, Decklan L. ([id number]) -------------------------------------------------------------------------------- Wound Assessment Details Patient  Name: Brenneman, Zachory L. Date of Service: 01/28/2018 1:30 PM Medical Record Number: [id number] Patient Account Number: [id number] Date of Birth/Sex: 06/28/1926 (84 y.o. M) Treating RN: Montey Hora Primary Care Bodhi Moradi: Ramonita Lab Other Clinician: Referring Sarita Hakanson: Ramonita Lab Treating Chaston Bradburn/Extender: Melburn Hake, HOYT Weeks in Treatment: 23 Wound Status Wound Number: 2 Primary Etiology: Pressure Ulcer Wound Location: Right Calcaneus Wound Status: Open Wounding Event: Pressure Injury Comorbid Arrhythmia, Hypertension, Received History: Radiation Date Acquired: 08/12/2017 Weeks Of Treatment: 23 Clustered Wound: No Photos Photo Uploaded By: Montey Hora on 01/28/2018 16:13:02 Wound Measurements Length: (cm) 0.1 Width: (cm) 0.1 Depth: (cm) 0.1 Area: (cm) 0.008 Volume: (cm) 0.001 % Reduction in Area: 99.9% % Reduction in Volume: 100% Epithelialization: Large (67-100%) Tunneling: No Undermining: No Wound Description Classification: Category/Stage III Wound Margin: Distinct, outline attached Exudate Amount: None Present Foul Odor After Cleansing: No Slough/Fibrino Yes Wound  Bed Granulation Amount: None Present (0%) Exposed Structure Necrotic Amount: None Present (0%) Fascia Exposed: No Fat Layer (Subcutaneous Tissue) Exposed: Yes Tendon Exposed: No Muscle Exposed: No Joint Exposed: No Bone Exposed: No Periwound Skin Texture Texture Color No Abnormalities Noted: No No Abnormalities Noted: No Strnad, [place] ([id number]) Callus: No Atrophie Blanche: No Crepitus: No Cyanosis: No Excoriation: No Ecchymosis: No Induration: No Erythema: No Rash: No Hemosiderin Staining: No Scarring: No Mottled: No Pallor: No Moisture Rubor: No No Abnormalities Noted: No Dry / Scaly: No Temperature / Pain Maceration: No Temperature: No Abnormality Tenderness on Palpation: Yes Wound Preparation Ulcer Cleansing: Rinsed/Irrigated with Saline Topical Anesthetic Applied: Other: lidocaine 4%, Electronic Signature(s) Signed: 01/28/2018 1:49:55 PM By: Montey Hora Entered By: Montey Hora on 01/28/2018 13:49:55 Yackley, Rolin L. ([id number]) -------------------------------------------------------------------------------- Vitals Details Patient Name: BARBIAN, Griffin L. Date of Service: 01/28/2018 1:30 PM Medical Record Number: [id number] Patient Account Number: [id number] Date of Birth/Sex: 04-Jul-1926 (84 y.o. M) Treating RN: Montey Hora Primary Care Linzey Ramser: Ramonita Lab Other Clinician: Referring Nayali Talerico: Ramonita Lab Treating Rosaleigh Brazzel/Extender: Melburn Hake, HOYT Weeks in Treatment: 23 Vital Signs Time Taken: 13:41 Temperature (F): 97.6 Height (in): 70 Pulse (bpm): 63 Weight (lbs): 190 Respiratory Rate (breaths/min): 16 Body Mass Index (BMI): 27.3 Blood Pressure (mmHg): 128/63 Reference Range: 80 - 120 mg / dl Electronic Signature(s) Signed: 02/03/2018 5:45:45 PM By: Montey Hora Entered By: Montey Hora on 01/28/2018 13:43:02

## 2018-02-25 ENCOUNTER — Encounter: Payer: PPO | Admitting: Physician Assistant

## 2018-02-25 DIAGNOSIS — L89623 Pressure ulcer of left heel, stage 3: Secondary | ICD-10-CM | POA: Diagnosis not present

## 2018-03-05 DIAGNOSIS — I10 Essential (primary) hypertension: Secondary | ICD-10-CM | POA: Diagnosis not present

## 2018-03-05 DIAGNOSIS — Z7901 Long term (current) use of anticoagulants: Secondary | ICD-10-CM | POA: Diagnosis not present

## 2018-03-05 DIAGNOSIS — L89613 Pressure ulcer of right heel, stage 3: Secondary | ICD-10-CM | POA: Diagnosis not present

## 2018-03-05 DIAGNOSIS — I482 Chronic atrial fibrillation: Secondary | ICD-10-CM | POA: Diagnosis not present

## 2018-03-05 DIAGNOSIS — L89623 Pressure ulcer of left heel, stage 3: Secondary | ICD-10-CM | POA: Diagnosis not present

## 2018-03-07 NOTE — Progress Notes (Signed)
Seydel, Koua L. ([id number]) Visit Report for 02/25/2018 Arrival Information Details Patient Name: RUXIN, RANSOME. Date of Service: 02/25/2018 1:00 PM Medical Record Number: [id number] Patient Account Number: [id number] Date of Birth/Sex: Dec 05, 1925 (84 y.o. M) Treating RN: Cornell Barman Primary Care Burke Terry: Ramonita Lab Other Clinician: Referring Tyaira Heward: Ramonita Lab Treating Rudene Poulsen/Extender: Melburn Hake, HOYT Weeks in Treatment: 54 Visit Information History Since Last Visit Added or deleted any medications: No Patient Arrived: Walker Any new allergies or adverse reactions: No Arrival Time: 12:56 Had a fall or experienced change in No Accompanied By: wife activities of daily living that may affect Transfer Assistance: Manual risk of falls: Patient Identification Verified: Yes Signs or symptoms of abuse/neglect since last visito No Secondary Verification Process Yes Hospitalized since last visit: No Completed: Implantable device outside of the clinic excluding No Patient Requires Transmission-Based No cellular tissue based products placed in the center Precautions: since last visit: Patient Has Alerts: Yes Has Dressing in Place as Prescribed: Yes Patient Alerts: Patient on Blood Pain Present Now: No Thinner Warfarin Electronic Signature(s) Signed: 02/25/2018 4:55:57 PM By: Gretta Cool, BSN, RN, CWS, Kim RN, BSN Entered By: Gretta Cool, BSN, RN, CWS, Kim on 02/25/2018 12:57:30 Nielson, Chisom L. ([id number]) -------------------------------------------------------------------------------- Encounter Discharge Information Details Patient Name: ALEXX, MCBURNEY. Date of Service: 02/25/2018 1:00 PM Medical Record Number: [id number] Patient Account Number: [id number] Date of Birth/Sex: [birth year] (84 y.o. M) Treating RN: Roger Shelter Primary Care Edythe Riches: Ramonita Lab Other Clinician: Referring Reign Dziuba: Ramonita Lab Treating Vertis Scheib/Extender: Melburn Hake, HOYT Weeks in Treatment:  62 Encounter Discharge Information Items Discharge Condition: Stable Ambulatory Status: Walker Discharge Destination: Home Transportation: Private Auto Accompanied By: wife Schedule Follow-up Appointment: Yes Clinical Summary of Care: Electronic Signature(s) Signed: 02/25/2018 4:53:55 PM By: Roger Shelter Entered By: Roger Shelter on 02/25/2018 13:31:49 Priore, Borna L. ([id number]) -------------------------------------------------------------------------------- Lower Extremity Assessment Details Patient Name: Shimmel, Rogers L. Date of Service: 02/25/2018 1:00 PM Medical Record Number: [id number] Patient Account Number: [id number] Date of Birth/Sex: Apr 04, 1926 (84 y.o. M) Treating RN: Cornell Barman Primary Care Eddison Searls: Ramonita Lab Other Clinician: Referring Gavan Nordby: Ramonita Lab Treating Rodney Yera/Extender: Melburn Hake, HOYT Weeks in Treatment: 27 Vascular Assessment Pulses: Dorsalis Pedis Palpable: [Left:Yes] Posterior Tibial Extremity colors, hair growth, and conditions: Extremity Color: [Left:Normal] Hair Growth on Extremity: [Left:No] Temperature of Extremity: [Left:Warm] Capillary Refill: [Left:< 3 seconds] Toe Nail Assessment Left: Right: Thick: Yes Discolored: Yes Deformed: Yes Improper Length and Hygiene: Yes Electronic Signature(s) Signed: 02/25/2018 4:55:57 PM By: Gretta Cool, BSN, RN, CWS, Kim RN, BSN Entered By: Gretta Cool, BSN, RN, CWS, Kim on 02/25/2018 13:05:09 Gsell, Layton L. ([id number]) -------------------------------------------------------------------------------- Multi Wound Chart Details Patient Name: DOVIDIO, Catrell L. Date of Service: 02/25/2018 1:00 PM Medical Record Number: [id number] Patient Account Number: [id number] Date of Birth/Sex: 06/06/1926 (84 y.o. M) Treating RN: Ahmed Prima Primary Care Xana Bradt: Ramonita Lab Other Clinician: Referring Ryon Layton: Ramonita Lab Treating Gaia Gullikson/Extender: Melburn Hake, HOYT Weeks in Treatment: 27 Vital  Signs Height(in): 70 Pulse(bpm): 98 Weight(lbs): 190 Blood Pressure(mmHg): 132/68 Body Mass Index(BMI): 27 Temperature(F): Respiratory Rate 16 (breaths/min): Photos: [1:No Photos] [N/A:N/A] Wound Location: [1:Left Calcaneus] [N/A:N/A] Wounding Event: [1:Pressure Injury] [N/A:N/A] Primary Etiology: [1:Pressure Ulcer] [N/A:N/A] Comorbid History: [1:Arrhythmia, Hypertension, Received Radiation] [N/A:N/A] Date Acquired: [1:08/12/2017] [N/A:N/A] Weeks of Treatment: [1:27] [N/A:N/A] Wound Status: [1:Open] [N/A:N/A] Measurements L x W x D [1:0.6x0.9x0.2] [N/A:N/A] (cm) Area (cm) : [1:0.424] [N/A:N/A] Volume (cm) : [1:0.085] [N/A:N/A] % Reduction in Area: [1:98.10%] [N/A:N/A] % Reduction in Volume: [1:98.10%] [N/A:N/A] Classification: [1:Category/Stage III] [N/A:N/A] Exudate Amount: [1:Large] [N/A:N/A] Exudate Type: [1:Purulent] [N/A:N/A] Exudate  Color: [1:yellow, brown, green] [N/A:N/A] Foul Odor After Cleansing: [1:Yes] [N/A:N/A] Odor Anticipated Due to [1:No] [N/A:N/A] Product Use: Wound Margin: [1:Distinct, outline attached] [N/A:N/A] Granulation Amount: [1:Medium (34-66%)] [N/A:N/A] Granulation Quality: [1:Pink] [N/A:N/A] Necrotic Amount: [1:Medium (34-66%)] [N/A:N/A] Exposed Structures: [1:Fat Layer (Subcutaneous Tissue) Exposed: Yes Fascia: No Tendon: No Muscle: No Joint: No Bone: No] [N/A:N/A] Epithelialization: [1:None] [N/A:N/A] Periwound Skin Texture: [1:Excoriation: Yes Induration: No Callus: No] [N/A:N/A] Crepitus: No Rash: No Scarring: No Periwound Skin Moisture: No Abnormalities Noted N/A N/A Periwound Skin Color: No Abnormalities Noted N/A N/A Temperature: No Abnormality N/A N/A Tenderness on Palpation: Yes N/A N/A Wound Preparation: Ulcer Cleansing: N/A N/A Rinsed/Irrigated with Saline Topical Anesthetic Applied: Other: lidocaine 4% Treatment Notes Electronic Signature(s) Signed: 02/26/2018 4:43:18 PM By: Alric Quan Entered By: Alric Quan  on 02/25/2018 13:17:55 Freeland, Lael L. ([id number]) -------------------------------------------------------------------------------- [place] Details Patient Name: ZEN, CEDILLOS. Date of Service: 02/25/2018 1:00 PM Medical Record Number: [id number] Patient Account Number: [id number] Date of Birth/Sex: [birth year] (84 y.o. M) Treating RN: Ahmed Prima Primary Care Bain Whichard: Ramonita Lab Other Clinician: Referring Caileb Rhue: Ramonita Lab Treating Boy Delamater/Extender: Melburn Hake, HOYT Weeks in Treatment: 85 Active Inactive ` Abuse / Safety / Falls / Self Care Management Nursing Diagnoses: History of Falls Potential for falls Goals: Patient will not experience any injury related to falls Date Initiated: 08/19/2017 Target Resolution Date: 12/14/2017 Goal Status: Active Interventions: Assess Activities of Daily Living upon admission and as needed Assess fall risk on admission and as needed Assess: immobility, friction, shearing, incontinence upon admission and as needed Assess impairment of mobility on admission and as needed per policy Assess personal safety and home safety (as indicated) on admission and as needed Assess self care needs on admission and as needed Notes: ` Nutrition Nursing Diagnoses: Imbalanced nutrition Potential for alteratiion in Nutrition/Potential for imbalanced nutrition Goals: Patient/caregiver agrees to and verbalizes understanding of need to use nutritional supplements and/or vitamins as prescribed Date Initiated: 08/19/2017 Target Resolution Date: 11/16/2017 Goal Status: Active Interventions: Assess patient nutrition upon admission and as needed per policy Notes: ` Orientation to the Wound Care Program Nursing Diagnoses: Gassen, Helmer L. ([id number]) Knowledge deficit related to the wound healing center program Goals: Patient/caregiver will verbalize understanding of the [place] Date Initiated:  08/19/2017 Target Resolution Date: 09/14/2017 Goal Status: Active Interventions: Provide education on orientation to the wound center Notes: ` Pain, Acute or Chronic Nursing Diagnoses: Pain, acute or chronic: actual or potential Potential alteration in comfort, pain Goals: Patient/caregiver will verbalize adequate pain control between visits Date Initiated: 08/19/2017 Target Resolution Date: 12/14/2017 Goal Status: Active Interventions: Complete pain assessment as per visit requirements Notes: ` Pressure Nursing Diagnoses: Knowledge deficit related to causes and risk factors for pressure ulcer development Knowledge deficit related to management of pressures ulcers Potential for impaired tissue integrity related to pressure, friction, moisture, and shear Goals: Patient will remain free from development of additional pressure ulcers Date Initiated: 08/19/2017 Target Resolution Date: 12/14/2017 Goal Status: Active Interventions: Assess: immobility, friction, shearing, incontinence upon admission and as needed Assess potential for pressure ulcer upon admission and as needed Notes: ` Wound/Skin Impairment Nursing Diagnoses: Impaired tissue integrity Knowledge deficit related to ulceration/compromised skin integrity Gutierres, Beren L. ([id number]) Goals: Ulcer/skin breakdown will have a volume reduction of 80% by week 12 Date Initiated: 08/19/2017 Target Resolution Date: 12/14/2017 Goal Status: Active Interventions: Assess patient/caregiver ability to perform ulcer/skin care regimen upon admission and as needed Assess ulceration(s) every visit Notes: Electronic Signature(s) Signed: 02/26/2018 4:43:18 PM  By: Alric Quan Entered By: Alric Quan on 02/25/2018 13:17:48 Akhtar, Kayla L. ([id number]) -------------------------------------------------------------------------------- Pain Assessment Details Patient Name: SIEFERT, Andie L. Date of Service: 02/25/2018 1:00 PM Medical  Record Number: [id number] Patient Account Number: [id number] Date of Birth/Sex: 12-Sep-1925 (84 y.o. M) Treating RN: Cornell Barman Primary Care Jonae Renshaw: Ramonita Lab Other Clinician: Referring Mava Suares: Ramonita Lab Treating Onaje Warne/Extender: Melburn Hake, HOYT Weeks in Treatment: 27 Active Problems Location of Pain Severity and Description of Pain Patient Has Paino No Site Locations Pain Management and Medication Current Pain Management: Electronic Signature(s) Signed: 02/25/2018 4:55:57 PM By: Gretta Cool, BSN, RN, CWS, Kim RN, BSN Entered By: Gretta Cool, BSN, RN, CWS, Kim on 02/25/2018 12:58:56 Swoboda, Illias L. ([id number]) -------------------------------------------------------------------------------- Patient/Caregiver Education Details Patient Name: HAYGEN, ZEBROWSKI L. Date of Service: 02/25/2018 1:00 PM Medical Record Number: [id number] Patient Account Number: [id number] Date of Birth/Gender: 1926/03/04 (84 y.o. M) Treating RN: Roger Shelter Primary Care Physician: Ramonita Lab Other Clinician: Referring Physician: Ramonita Lab Treating Physician/Extender: Sharalyn Ink in Treatment: 38 Education Assessment Education Provided To: Patient Education Topics Provided Wound Debridement: Handouts: Wound Debridement Methods: Explain/Verbal Responses: State content correctly Wound/Skin Impairment: Handouts: Caring for Your Ulcer Methods: Explain/Verbal Responses: State content correctly Electronic Signature(s) Signed: 02/25/2018 4:53:55 PM By: Roger Shelter Entered By: Roger Shelter on 02/25/2018 13:32:03 Headrick, Verdis L. ([id number]) -------------------------------------------------------------------------------- Wound Assessment Details Patient Name: KNOBLOCK, Sagan L. Date of Service: 02/25/2018 1:00 PM Medical Record Number: [id number] Patient Account Number: [id number] Date of Birth/Sex: 1925-07-31 (84 y.o. M) Treating RN: Cornell Barman Primary Care Reva Pinkley: Ramonita Lab Other  Clinician: Referring Alfreida Steffenhagen: Ramonita Lab Treating Javon Snee/Extender: Melburn Hake, HOYT Weeks in Treatment: 27 Wound Status Wound Number: 1 Primary Etiology: Pressure Ulcer Wound Location: Left Calcaneus Wound Status: Open Wounding Event: Pressure Injury Comorbid Arrhythmia, Hypertension, Received History: Radiation Date Acquired: 08/12/2017 Weeks Of Treatment: 27 Clustered Wound: No Photos Photo Uploaded By: Gretta Cool, BSN, RN, CWS, Kim on 02/25/2018 15:25:23 Wound Measurements Length: (cm) 0.6 Width: (cm) 0.9 Depth: (cm) 0.2 Area: (cm) 0.424 Volume: (cm) 0.085 % Reduction in Area: 98.1% % Reduction in Volume: 98.1% Epithelialization: None Tunneling: No Undermining: No Wound Description Classification: Category/Stage III Foul Odor Wound Margin: Distinct, outline attached Due to Pr Exudate Amount: Large Slough/Fi Exudate Type: Purulent Exudate Color: yellow, brown, green After Cleansing: Yes oduct Use: No brino Yes Wound Bed Granulation Amount: Medium (34-66%) Exposed Structure Granulation Quality: Pink Fascia Exposed: No Necrotic Amount: Medium (34-66%) Fat Layer (Subcutaneous Tissue) Exposed: Yes Necrotic Quality: Adherent Slough Tendon Exposed: No Muscle Exposed: No Joint Exposed: No Bone Exposed: No Periwound Skin Texture Reichardt, Rickardo L. ([id number]) Texture Color No Abnormalities Noted: No No Abnormalities Noted: Yes Callus: No Temperature / Pain Crepitus: No Temperature: No Abnormality Excoriation: Yes Tenderness on Palpation: Yes Induration: No Rash: No Scarring: No Moisture No Abnormalities Noted: No Wound Preparation Ulcer Cleansing: Rinsed/Irrigated with Saline Topical Anesthetic Applied: Other: lidocaine 4%, Treatment Notes Wound #1 (Left Calcaneus) 1. Cleansed with: Clean wound with Normal Saline 2. Anesthetic Topical Lidocaine 4% cream to wound bed prior to debridement 3. Peri-wound Care: Skin Prep 4. Dressing Applied: Prisma  Ag 5. Secondary Dressing Applied Bordered Foam Dressing Electronic Signature(s) Signed: 02/25/2018 4:55:57 PM By: Gretta Cool, BSN, RN, CWS, Kim RN, BSN Entered By: Gretta Cool, BSN, RN, CWS, Kim on 02/25/2018 13:04:38 Conry, Treyshawn L. ([id number]) -------------------------------------------------------------------------------- Vitals Details Patient Name: YADAV, Kaylan L. Date of Service: 02/25/2018 1:00 PM Medical Record Number: [id number] Patient Account Number: [id number] Date of Birth/Sex: 12-03-1925 (84 y.o. M) Treating RN: Cornell Barman  Primary Care Sims Laday: Ramonita Lab Other Clinician: Referring Eleesha Purkey: Ramonita Lab Treating Dee Maday/Extender: Melburn Hake, HOYT Weeks in Treatment: 27 Vital Signs Time Taken: 12:59 Pulse (bpm): 98 Height (in): 70 Respiratory Rate (breaths/min): 16 Weight (lbs): 190 Blood Pressure (mmHg): 132/68 Body Mass Index (BMI): 27.3 Reference Range: 80 - 120 mg / dl Electronic Signature(s) Signed: 02/25/2018 4:55:57 PM By: Gretta Cool, BSN, RN, CWS, Kim RN, BSN Entered By: Gretta Cool, BSN, RN, CWS, Kim on 02/25/2018 12:59:22

## 2018-03-07 NOTE — Progress Notes (Signed)
Manzo, Wacey L. ([id number]) Visit Report for 02/25/2018 Chief Complaint Document Details Patient Name: [name], [name] Date of Service: 02/25/2018 1:00 PM Medical Record Number: [id number] Patient Account Number: [id number] Date of Birth/Sex: Jul 20, 1925 (84 y.o. M) Treating RN: Ahmed Prima Primary Care Provider: Ramonita Lab Other Clinician: Referring Provider: Ramonita Lab Treating Provider/Extender: Melburn Hake, Mackayla Mullins Weeks in Treatment: 16 Information Obtained from: Patient Chief Complaint Bilateral Heel pressure ulcers and bilateral great toe DTIs Electronic Signature(s) Signed: 02/26/2018 12:11:53 PM By: Worthy Keeler PA-C Entered By: Worthy Keeler on 02/25/2018 14:02:26 Morriss, [name] ([id number]) -------------------------------------------------------------------------------- Debridement Details Patient Name: [name], [name] Date of Service: 02/25/2018 1:00 PM Medical Record Number: [id number] Patient Account Number: [id number] Date of Birth/Sex: [birth year] (84 y.o. M) Treating RN: Ahmed Prima Primary Care Provider: Ramonita Lab Other Clinician: Referring Provider: Ramonita Lab Treating Provider/Extender: Melburn Hake, Avett Reineck Weeks in Treatment: 27 Debridement Performed for Wound #1 Left Calcaneus Assessment: Performed By: Physician STONE III, Dymin Dingledine [name]., PA-C Debridement Type: Debridement Pre-procedure Verification/Time Yes - 13:19 Out Taken: Start Time: 13:19 Pain Control: Lidocaine 4% Topical Solution Total Area Debrided (L x W): 0.6 (cm) x 0.9 (cm) = 0.54 (cm) Tissue and other material Viable, Non-Viable, Slough, Subcutaneous, Fibrin/Exudate, Slough debrided: Level: Skin/Subcutaneous Tissue Debridement Description: Excisional Instrument: Curette Bleeding: Minimum Hemostasis Achieved: Pressure End Time: 13:21 Procedural Pain: 0 Post Procedural Pain: 0 Response to Treatment: Procedure was tolerated well Level of Consciousness: Awake and Alert Post  Debridement Measurements of Total Wound Length: (cm) 0.6 Stage: Category/Stage III Width: (cm) 0.9 Depth: (cm) 0.3 Volume: (cm) 0.127 Character of Wound/Ulcer Post Stable Debridement: Post Procedure Diagnosis Same as Pre-procedure Electronic Signature(s) Signed: 02/26/2018 12:11:53 PM By: Worthy Keeler PA-C Signed: 02/26/2018 4:43:18 PM By: Alric Quan Entered By: Alric Quan on 02/25/2018 13:22:05 [name], [name] ([id number]) -------------------------------------------------------------------------------- HPI Details Patient Name: [name], [name] Date of Service: 02/25/2018 1:00 PM Medical Record Number: [id number] Patient Account Number: [id number] Date of Birth/Sex: 1926-01-27 (84 y.o. M) Treating RN: Ahmed Prima Primary Care Provider: Ramonita Lab Other Clinician: Referring Provider: Ramonita Lab Treating Provider/Extender: Melburn Hake, Tilly Pernice Weeks in Treatment: 27 History of Present Illness Associated Signs and Symptoms: Patient has a history of chronic atrophic relation, hypertension, and long-term use of anticoagulants. He also had a fall with pelvic fracture which occurred on January 2019. HPI Description: 08/19/17 patient presents today for initial evaluation and our clinic concerning issues he has been having with the bilateral heels as well as the lateral great toe was since he was admitted to the nursing facility following a pelvic fracture. Initially he was spending a significant amount of time in the bed although he is now up more often since the healing process has progressed along the way obviously. When he is up he's spending most of the time sitting in a wheelchair at this point. When he is in bed he lays on his back and the covers/sheets are pushing down on his toes bilaterally which I think it may be what's causing the issues with his deep tissue injury to the bilateral great toes. With that being said the hills also appear to be pressure in nature and  I think that laying in the bed getting pressure to the sites is what has led to the formation of these on stage will pressure ulcers. Fortunately there does not appear to be any infection in both areas of ulceration of the heel appear to be stable at this point. Patient does not have any significant pain  at the site she does have some of the eschar along the edges which is starting to lift up and come all hopefully slowly but surely this will occur. Patient did have arterial studies performed April 2018 which showed a normal TBI on the right knee slightly depressed TBI on the left but this does not appear to have changed significantly at that point. Specifically this was 0.75 on the right and 0.61 on the left. 09/13/17 on evaluation today patient presents for a follow-up evaluation regarding his bilateral heal ulcers. He has been tolerating the dressing changes fairly well although he did have some bleeding earlier today when it appears some of the skin on the surrounding portion of the wound got pulled where this is starting to flake off. Subsequently there does not appear to be any bleeding right now. Since I last saw him he has been discharged from the facility and is now back at home. His wife is having a very difficult time being able to get him up and in the car in order to come for an appointment which is why they missed the last appointment have not been able to come back sooner. Fortunately the he does not have any signs of infection. No fevers chills noted. He does have difficulty walking due to pain in the bilateral heels. 10/11/17 on evaluation today patient presents for follow-up evaluation concerning his ongoing bilateral heal ulcers. He has been tolerating the dressing changes which were Betadine paints daily without complication. I have not seen him since September 13, 2017. Pressure that I saw him August 19, 2017. Subsequently those are the only prior visits before today. His left  heel actually seems to have a significant amount of eschar that is starting to lift up at this point and seems to be a little more loose as far as drainage building up underneath the eschar. The right eschar though there are some parts lifting up seems to still be fairly stable which is good news. His toe ulcer is almost completely healed a lot of that eschar came off and it looks very well u 01/06/18 on evaluation today patient actually appears to be doing excellent in regard to his bilateral heal ulcers. The right heel is very close to completely closing and overall this is doing excellent. Left heel #2 great improvement as it has been for the past several weeks. I'm extremely pleased in this regard. Overall the patient is also happy to his wife seems to be doing an excellent job caring for him in my pinion. 01/14/18 on evaluation today patient actually appears to be doing better in regard to both heal ulcers. The right heel is almost completely healed there was some callous buildup around the edge of the wound but overall I feel like things are progressing quite nicely. In regard to the left heel also feel like there's good epithelialization however this has been obviously somewhat slower it was also much larger initially when the eschar was removed so again I'm not really too worried about it I think it's progressing in a good fashion. Overall I feel like the patient is doing excellent. 01/21/18 on evaluation today patient appears to actually be doing rather well in regard to his lower extremity heels. His right lower extremity actually was almost completely healed although there was a very small opening still remaining that was noted during debridement today. Nonetheless overall I feel like he is making good progress. No fevers, chills, nausea, or vomiting noted at this time.  Schwarting, [name] ([id number]) 01/28/18 on evaluation today patient's right heel ulcer appears to be all but closed. There  still just a very small almost pinpoint opening at this point overall I'm very pleased with how things have progressed. The left ulcer is still more open although there is definitely signs of epithelialization even since last week I feel like things are improving. 02/04/18 on evaluation today patient appears to actually be doing very well in regard to his right field ulcer this appears to be completely healed in fact which is excellent news. In regard to left heel ulcer this is smaller although not completely close to yet. Nonetheless he seemed to be making great progress. 02/11/18 He is here for follow up evaluation for a left heel stage III pressure ulcer. It is stable. He continues with offloading with ambulation and with rest. We have instructed patient's wife to pack prisma into all areas and depth. they are requesting a two week follow up 02/25/18 on evaluation today patient appears to be doing much better in regard to his left heel ulcer. The right heel still completely close and appears to be doing excellent. The patient has been tolerating the dressing changes without complication his wife still continues to perform these for him. Overall I think he is doing excellent and has made wonderful progress since we began taking care of his heels. Nonetheless it has obviously been quite some time we are now at the 27 week mark. Electronic Signature(s) Signed: 02/26/2018 12:11:53 PM By: Worthy Keeler PA-C Entered By: Worthy Keeler on 02/25/2018 14:02:47 Roane, [name] ([id number]) -------------------------------------------------------------------------------- Physical Exam Details Patient Name: Kings, [name] Date of Service: 02/25/2018 1:00 PM Medical Record Number: [id number] Patient Account Number: [id number] Date of Birth/Sex: Aug 06, 1925 (84 y.o. M) Treating RN: Ahmed Prima Primary Care Provider: Ramonita Lab Other Clinician: Referring Provider: Ramonita Lab Treating  Provider/Extender: STONE III, Keelen Quevedo Weeks in Treatment: 60 Constitutional Well-nourished and well-hydrated in no acute distress. Respiratory normal breathing without difficulty. Psychiatric this patient is able to make decisions and demonstrates good insight into disease process. Alert and Oriented x 3. pleasant and cooperative. Notes Patient's wound bed at this point actually did have some dried dressing/callous overlying the surface of a portion of the wound and when revealed one of the small openings was actually completely sealed and close the other still had a little bit of a depth to it and will need to be addressed and packed. Fortunately there does not appear to be any evidence of infection and post debridement the wound in general appears to be doing much better overall I'm extremely pleased with the progress. Electronic Signature(s) Signed: 02/26/2018 12:11:53 PM By: Worthy Keeler PA-C Entered By: Worthy Keeler on 02/25/2018 14:03:20 Coste, [name] ([id number]) -------------------------------------------------------------------------------- Physician Orders Details Patient Name: OLIVIER, [name] Date of Service: 02/25/2018 1:00 PM Medical Record Number: [id number] Patient Account Number: [id number] Date of Birth/Sex: [birth year] (84 y.o. M) Treating RN: Ahmed Prima Primary Care Provider: Ramonita Lab Other Clinician: Referring Provider: Ramonita Lab Treating Provider/Extender: Melburn Hake, Alontae Chaloux Weeks in Treatment: 72 Verbal / Phone Orders: Yes Clinician: Carolyne Fiscal, Debi Read Back and Verified: Yes Diagnosis Coding Wound Cleansing Wound #1 Left Calcaneus o Clean wound with Normal Saline. o Cleanse wound with mild soap and water o May Shower, gently pat wound dry prior to applying new dressing. Anesthetic (add to Medication List) Wound #1 Left Calcaneus o Topical Lidocaine 4% cream applied to wound bed prior to debridement (In Clinic  Only). Skin  Barriers/Peri-Wound Care Wound #1 Left Calcaneus o Skin Prep Primary Wound Dressing Wound #1 Left Calcaneus o Silver Alginate - place over the Prisma Ag o Silver Collagen - lightly moisten with saline Secondary Dressing Wound #1 Left Calcaneus o Boardered Foam Dressing Dressing Change Frequency Wound #1 Left Calcaneus o Change dressing every other day. Follow-up Appointments Wound #1 Left Calcaneus o Return Appointment in 2 weeks. Off-Loading Wound #1 Left Calcaneus o Turn and reposition every 2 hours o Other: - float heels at bedtime Additional Orders / Instructions Wound #1 Left Calcaneus o Increase protein intake. Soberano, [name] ([id number]) Patient Medications Allergies: No Known Allergies Notifications Medication Indication Start End lidocaine DOSE 1 - topical 4 % cream - 1 cream topical Electronic Signature(s) Signed: 02/26/2018 12:11:53 PM By: Worthy Keeler PA-C Signed: 02/26/2018 4:43:18 PM By: Alric Quan Entered By: Alric Quan on 02/25/2018 13:27:29 Anderle, [name] ([id number]) -------------------------------------------------------------------------------- Prescription 02/25/2018 Patient Name: ZIYAN, SCHOON L. Provider: Worthy Keeler PA-C Date of Birth: 1925/10/16 NPI#: [id number] Sex: Jerilynn Mages DEA#: [id number] Phone #: [phone] License #: Patient Address: [facility] [place], [place] [address] [address] [place], [place] [address] [phone] Allergies No Known Allergies Medication Medication: Route: Strength: Form: lidocaine 4 % topical cream topical 4% cream Class: TOPICAL LOCAL ANESTHETICS Dose: Frequency / Time: Indication: 1 1 cream topical Number of Refills: Number of Units: 0 Generic Substitution: Start Date: End Date: One Time Use: Substitution Permitted No Note to  Pharmacy: Signature(s): Date(s): Electronic Signature(s) Signed: 02/26/2018 12:11:53 PM By: Worthy Keeler PA-C Signed: 02/26/2018 4:43:18 PM By: Alric Quan Entered By: Alric Quan on 02/25/2018 13:27:29 Sollenberger, Kallen L. ([id number]) --------------------------------------------------------------------------------  Problem List Details Patient Name: REASON, [name] Date of Service: 02/25/2018 1:00 PM Medical Record Number: [id number] Patient Account Number: [id number] Date of Birth/Sex: 25-Nov-1925 (84 y.o. M) Treating RN: Ahmed Prima Primary Care Provider: Ramonita Lab Other Clinician: Referring Provider: Ramonita Lab Treating Provider/Extender: Melburn Hake, Michelle Vanhise Weeks in Treatment: 27 Active Problems ICD-10 Evaluated Encounter Code Description Active Date Today Diagnosis L89.623 Pressure ulcer of left heel, stage 3 08/19/2017 No Yes I48.2 Chronic atrial fibrillation 08/19/2017 No Yes I10 Essential (primary) hypertension 08/19/2017 No Yes Z79.01 Long term (current) use of anticoagulants 08/19/2017 No Yes Inactive Problems Resolved Problems ICD-10 Code Description Active Date Resolved Date L89.613 Pressure ulcer of right heel, stage 3 08/19/2017 08/19/2017 Electronic Signature(s) Signed: 02/26/2018 12:11:53 PM By: Worthy Keeler PA-C Entered By: Worthy Keeler on 02/25/2018 14:02:20 Majer, [name] ([id number]) -------------------------------------------------------------------------------- Progress Note Details Patient Name: Lazaro, [name] Date of Service: 02/25/2018 1:00 PM Medical Record Number: [id number] Patient Account Number: [id number] Date of Birth/Sex: 07/17/1925 (84 y.o. M) Treating RN: Ahmed Prima Primary Care Provider: Ramonita Lab Other Clinician: Referring Provider: Ramonita Lab Treating Provider/Extender: Melburn Hake, Elin Seats Weeks in Treatment: 27 Subjective Chief Complaint Information obtained from Patient Bilateral Heel pressure ulcers and  bilateral great toe DTIs History of Present Illness (HPI) The following HPI elements were documented for the patient's wound: Associated Signs and Symptoms: Patient has a history of chronic atrophic relation, hypertension, and long-term use of anticoagulants. He also had a fall with pelvic fracture which occurred on January 2019. 08/19/17 patient presents today for initial evaluation and our clinic concerning issues he has been having with the bilateral heels as well as the lateral great toe was since he was admitted to the nursing facility following a pelvic fracture.  Initially he was spending a significant amount of time in the bed although he is now up more often since the healing process has progressed along the way obviously. When he is up he's spending most of the time sitting in a wheelchair at this point. When he is in bed he lays on his back and the covers/sheets are pushing down on his toes bilaterally which I think it may be what's causing the issues with his deep tissue injury to the bilateral great toes. With that being said the hills also appear to be pressure in nature and I think that laying in the bed getting pressure to the sites is what has led to the formation of these on stage will pressure ulcers. Fortunately there does not appear to be any infection in both areas of ulceration of the heel appear to be stable at this point. Patient does not have any significant pain at the site she does have some of the eschar along the edges which is starting to lift up and come all hopefully slowly but surely this will occur. Patient did have arterial studies performed April 2018 which showed a normal TBI on the right knee slightly depressed TBI on the left but this does not appear to have changed significantly at that point. Specifically this was 0.75 on the right and 0.61 on the left. 09/13/17 on evaluation today patient presents for a follow-up evaluation regarding his bilateral heal ulcers.  He has been tolerating the dressing changes fairly well although he did have some bleeding earlier today when it appears some of the skin on the surrounding portion of the wound got pulled where this is starting to flake off. Subsequently there does not appear to be any bleeding right now. Since I last saw him he has been discharged from the facility and is now back at home. His wife is having a very difficult time being able to get him up and in the car in order to come for an appointment which is why they missed the last appointment have not been able to come back sooner. Fortunately the he does not have any signs of infection. No fevers chills noted. He does have difficulty walking due to pain in the bilateral heels. 10/11/17 on evaluation today patient presents for follow-up evaluation concerning his ongoing bilateral heal ulcers. He has been tolerating the dressing changes which were Betadine paints daily without complication. I have not seen him since September 13, 2017. Pressure that I saw him August 19, 2017. Subsequently those are the only prior visits before today. His left heel actually seems to have a significant amount of eschar that is starting to lift up at this point and seems to be a little more loose as far as drainage building up underneath the eschar. The right eschar though there are some parts lifting up seems to still be fairly stable which is good news. His toe ulcer is almost completely healed a lot of that eschar came off and it looks very well u 01/06/18 on evaluation today patient actually appears to be doing excellent in regard to his bilateral heal ulcers. The right heel is very close to completely closing and overall this is doing excellent. Left heel #2 great improvement as it has been for the past several weeks. I'm extremely pleased in this regard. Overall the patient is also happy to his wife seems to be doing an excellent job caring for him in my pinion. 01/14/18 on  evaluation today patient actually  appears to be doing better in regard to both heal ulcers. The right heel is almost completely healed there was some callous buildup around the edge of the wound but overall I feel like things are progressing Sliva, [name] ([id number]) quite nicely. In regard to the left heel also feel like there's good epithelialization however this has been obviously somewhat slower it was also much larger initially when the eschar was removed so again I'm not really too worried about it I think it's progressing in a good fashion. Overall I feel like the patient is doing excellent. 01/21/18 on evaluation today patient appears to actually be doing rather well in regard to his lower extremity heels. His right lower extremity actually was almost completely healed although there was a very small opening still remaining that was noted during debridement today. Nonetheless overall I feel like he is making good progress. No fevers, chills, nausea, or vomiting noted at this time. 01/28/18 on evaluation today patient's right heel ulcer appears to be all but closed. There still just a very small almost pinpoint opening at this point overall I'm very pleased with how things have progressed. The left ulcer is still more open although there is definitely signs of epithelialization even since last week I feel like things are improving. 02/04/18 on evaluation today patient appears to actually be doing very well in regard to his right field ulcer this appears to be completely healed in fact which is excellent news. In regard to left heel ulcer this is smaller although not completely close to yet. Nonetheless he seemed to be making great progress. 02/11/18 He is here for follow up evaluation for a left heel stage III pressure ulcer. It is stable. He continues with offloading with ambulation and with rest. We have instructed patient's wife to pack prisma into all areas and depth. they are requesting a  two week follow up 02/25/18 on evaluation today patient appears to be doing much better in regard to his left heel ulcer. The right heel still completely close and appears to be doing excellent. The patient has been tolerating the dressing changes without complication his wife still continues to perform these for him. Overall I think he is doing excellent and has made wonderful progress since we began taking care of his heels. Nonetheless it has obviously been quite some time we are now at the 27 week mark. Patient History Information obtained from Patient. Family History No family history of Cancer, Diabetes, Heart Disease, Hereditary Spherocytosis, Hypertension, Kidney Disease, Lung Disease, Seizures, Stroke, Thyroid Problems, Tuberculosis. Social History Former smoker, Marital Status - Married, Alcohol Use - Rarely - 2oz day, Drug Use - No History, Caffeine Use - Daily. Review of Systems (ROS) Constitutional Symptoms (General Health) Denies complaints or symptoms of Fever, Chills. Respiratory The patient has no complaints or symptoms. Cardiovascular The patient has no complaints or symptoms. Psychiatric The patient has no complaints or symptoms. Objective Constitutional Well-nourished and well-hydrated in no acute distress. Full, [name] ([id number]) Vitals Time Taken: 12:59 PM, Height: 70 in, Weight: 190 lbs, BMI: 27.3, Pulse: 98 bpm, Respiratory Rate: 16 breaths/min, Blood Pressure: 132/68 mmHg. Respiratory normal breathing without difficulty. Psychiatric this patient is able to make decisions and demonstrates good insight into disease process. Alert and Oriented x 3. pleasant and cooperative. General Notes: Patient's wound bed at this point actually did have some dried dressing/callous overlying the surface of a portion of the wound and when revealed one of the small openings was actually completely  sealed and close the other still had a little bit of a depth to it and  will need to be addressed and packed. Fortunately there does not appear to be any evidence of infection and post debridement the wound in general appears to be doing much better overall I'm extremely pleased with the progress. Integumentary (Hair, Skin) Wound #1 status is Open. Original cause of wound was Pressure Injury. The wound is located on the Left Calcaneus. The wound measures 0.6cm length x 0.9cm width x 0.2cm depth; 0.424cm^2 area and 0.085cm^3 volume. There is Fat Layer (Subcutaneous Tissue) Exposed exposed. There is no tunneling or undermining noted. There is a large amount of purulent drainage noted. Foul odor after cleansing was noted. The wound margin is distinct with the outline attached to the wound base. There is medium (34-66%) pink granulation within the wound bed. There is a medium (34-66%) amount of necrotic tissue within the wound bed including Adherent Slough. The periwound skin appearance had no abnormalities noted for color. The periwound skin appearance exhibited: Excoriation. The periwound skin appearance did not exhibit: Callus, Crepitus, Induration, Rash, Scarring. Periwound temperature was noted as No Abnormality. The periwound has tenderness on palpation. Assessment Active Problems ICD-10 Pressure ulcer of left heel, stage 3 Chronic atrial fibrillation Essential (primary) hypertension Long term (current) use of anticoagulants Procedures Wound #1 Pre-procedure diagnosis of Wound #1 is a Pressure Ulcer located on the Left Calcaneus . There was a Excisional Skin/Subcutaneous Tissue Debridement with a total area of 0.54 sq cm performed by STONE III, [name], PA-C. With the following instrument(s): Curette to remove Viable and Non-Viable tissue/material. Material removed includes Subcutaneous Tissue, Slough, and Fibrin/Exudate after achieving pain control using Lidocaine 4% Topical Solution. No specimens were taken. A time out was conducted at 13:19, prior to the  start of the procedure. A Minimum amount of bleeding was controlled with Pressure. The procedure was tolerated well with a pain level of 0 throughout and a pain level of 0 following the procedure. Patient s Level of Consciousness post procedure was recorded as Awake and Alert. Post Debridement Durocher, [name] ([id number]) Measurements: 0.6cm length x 0.9cm width x 0.3cm depth; 0.127cm^3 volume. Post debridement Stage noted as Category/Stage III. Character of Wound/Ulcer Post Debridement is stable. Post procedure Diagnosis Wound #1: Same as Pre-Procedure Plan Wound Cleansing: Wound #1 Left Calcaneus: Clean wound with Normal Saline. Cleanse wound with mild soap and water May Shower, gently pat wound dry prior to applying new dressing. Anesthetic (add to Medication List): Wound #1 Left Calcaneus: Topical Lidocaine 4% cream applied to wound bed prior to debridement (In Clinic Only). Skin Barriers/Peri-Wound Care: Wound #1 Left Calcaneus: Skin Prep Primary Wound Dressing: Wound #1 Left Calcaneus: Silver Alginate - place over the Prisma Ag Silver Collagen - lightly moisten with saline Secondary Dressing: Wound #1 Left Calcaneus: Boardered Foam Dressing Dressing Change Frequency: Wound #1 Left Calcaneus: Change dressing every other day. Follow-up Appointments: Wound #1 Left Calcaneus: Return Appointment in 2 weeks. Off-Loading: Wound #1 Left Calcaneus: Turn and reposition every 2 hours Other: - float heels at bedtime Additional Orders / Instructions: Wound #1 Left Calcaneus: Increase protein intake. The following medication(s) was prescribed: lidocaine topical 4 % cream 1 1 cream topical was prescribed at facility At this time my suggestion is going to be that we go ahead and continue with the above wound care measures for the next week. The patient is in agreement the plan. We will subsequently see were things stand at follow-up. Please  see above for specific wound care orders.  We will see patient for re-evaluation in 2 week(s) here in the clinic. If anything worsens or changes patient will contact our office for additional recommendations. Electronic Signature(s) Signed: 02/26/2018 12:11:53 PM By: Worthy Keeler PA-C Muston, [name] ([id number]) Entered By: Worthy Keeler on 02/25/2018 14:03:59 [place] ([id number]) -------------------------------------------------------------------------------- ROS/PFSH Details Patient Name: GROPP, [name] Date of Service: 02/25/2018 1:00 PM Medical Record Number: [id number] Patient Account Number: [id number] Date of Birth/Sex: Dec 03, 1925 (84 y.o. M) Treating RN: Ahmed Prima Primary Care Provider: Ramonita Lab Other Clinician: Referring Provider: Ramonita Lab Treating Provider/Extender: Melburn Hake, Amarii Amy Weeks in Treatment: 61 Information Obtained From Patient Wound History Do you currently have one or more open woundso Yes How many open wounds do you currently haveo 2 Approximately how long have you had your woundso 1 week How have you been treating your wound(s) until nowo nothing Has your wound(s) ever healed and then re-openedo No Have you had any lab work done in the past montho No Have you tested positive for osteomyelitis (bone infection)o No Have you had any tests for circulation on your legso No Have you had other problems associated with your woundso Swelling Constitutional Symptoms (General Health) Complaints and Symptoms: Negative for: Fever; Chills Respiratory Complaints and Symptoms: No Complaints or Symptoms Cardiovascular Complaints and Symptoms: No Complaints or Symptoms Medical History: Positive for: Arrhythmia - a-fib; Hypertension Oncologic Medical History: Positive for: Received Radiation Psychiatric Complaints and Symptoms: No Complaints or Symptoms Immunizations Pneumococcal Vaccine: Received Pneumococcal Vaccination: Yes Implantable Devices Family and Social  History Noda, Lenford L. ([id number]) Cancer: No; Diabetes: No; Heart Disease: No; Hereditary Spherocytosis: No; Hypertension: No; Kidney Disease: No; Lung Disease: No; Seizures: No; Stroke: No; Thyroid Problems: No; Tuberculosis: No; Former smoker; Marital Status - Married; Alcohol Use: Rarely - 2oz day; Drug Use: No History; Caffeine Use: Daily; Financial Concerns: No; Food, Clothing or Shelter Needs: No; Support System Lacking: No; Transportation Concerns: No; Advanced Directives: Yes (Not Provided); Patient does not want information on Advanced Directives; Do not resuscitate: No; Living Will: Yes (Not Provided); Medical Power of Attorney: No Physician Affirmation I have reviewed and agree with the above information. Electronic Signature(s) Signed: 02/26/2018 12:11:53 PM By: Worthy Keeler PA-C Signed: 02/26/2018 4:43:18 PM By: Alric Quan Entered By: Worthy Keeler on 02/25/2018 14:03:04 [place], [place] ([id number]) -------------------------------------------------------------------------------- SuperBill Details Patient Name: FRANCISCO, [name] Date of Service: 02/25/2018 Medical Record Number: [id number] Patient Account Number: [id number] Date of Birth/Sex: 06-Apr-1926 (84 y.o. M) Treating RN: Ahmed Prima Primary Care Provider: Ramonita Lab Other Clinician: Referring Provider: Ramonita Lab Treating Provider/Extender: Melburn Hake, Naomi Castrogiovanni Weeks in Treatment: 27 Diagnosis Coding ICD-10 Codes Code Description [phone] Pressure ulcer of left heel, stage 3 I48.2 Chronic atrial fibrillation I10 Essential (primary) hypertension Z79.01 Long term (current) use of anticoagulants Facility Procedures CPT4 Code: [id number] Description: 23536 - DEB SUBQ TISSUE 20 SQ CM/< ICD-10 Diagnosis Description L89.623 Pressure ulcer of left heel, stage 3 Modifier: Quantity: 1 Physician Procedures CPT4 Code: [id number] Description: 00867 - WC PHYS SUBQ TISS 20 SQ CM ICD-10 Diagnosis Description L89.623  Pressure ulcer of left heel, stage 3 Modifier: Quantity: 1 Electronic Signature(s) Signed: 02/26/2018 12:11:53 PM By: Worthy Keeler PA-C Entered By: Worthy Keeler on 02/25/2018 14:04:11

## 2018-03-14 ENCOUNTER — Encounter: Payer: PPO | Attending: Physician Assistant | Admitting: Physician Assistant

## 2018-03-14 DIAGNOSIS — L89613 Pressure ulcer of right heel, stage 3: Secondary | ICD-10-CM | POA: Insufficient documentation

## 2018-03-14 DIAGNOSIS — Z87891 Personal history of nicotine dependence: Secondary | ICD-10-CM | POA: Diagnosis not present

## 2018-03-14 DIAGNOSIS — Z7901 Long term (current) use of anticoagulants: Secondary | ICD-10-CM | POA: Insufficient documentation

## 2018-03-14 DIAGNOSIS — L89623 Pressure ulcer of left heel, stage 3: Secondary | ICD-10-CM | POA: Insufficient documentation

## 2018-03-14 DIAGNOSIS — I10 Essential (primary) hypertension: Secondary | ICD-10-CM | POA: Diagnosis not present

## 2018-03-14 DIAGNOSIS — I482 Chronic atrial fibrillation: Secondary | ICD-10-CM | POA: Insufficient documentation

## 2018-03-16 NOTE — Progress Notes (Signed)
Bamberg, Dray L. ([id number]) Visit Report for 03/14/2018 Arrival Information Details Patient Name: [name], [name] Date of Service: 03/14/2018 2:00 PM Medical Record Number: [id number] Patient Account Number: [id number] Date of Birth/Sex: Dec 24, 1925 (84 y.o. M) Treating RN: Montey Hora Primary Care Azalee Weimer: Ramonita Lab Other Clinician: Referring Alannah Averhart: Ramonita Lab Treating Bexton Haak/Extender: Melburn Hake, HOYT Weeks in Treatment: 47 Visit Information History Since Last Visit Added or deleted any medications: No Patient Arrived: Walker Any new allergies or adverse reactions: No Arrival Time: 14:03 Had a fall or experienced change in No Accompanied By: wife activities of daily living that may affect Transfer Assistance: None risk of falls: Patient Identification Verified: Yes Signs or symptoms of abuse/neglect since last visito No Secondary Verification Process Yes Hospitalized since last visit: No Completed: Implantable device outside of the clinic excluding No Patient Requires Transmission-Based No cellular tissue based products placed in the center Precautions: since last visit: Patient Has Alerts: Yes Has Dressing in Place as Prescribed: Yes Patient Alerts: Patient on Blood Pain Present Now: No Thinner Warfarin Electronic Signature(s) Signed: 03/14/2018 5:00:18 PM By: Lorine Bears RCP, RRT, CHT Entered By: Lorine Bears on 03/14/2018 14:04:39 [name], [name] ([id number]) -------------------------------------------------------------------------------- Lower Extremity Assessment Details Patient Name: [name], [name] Date of Service: 03/14/2018 2:00 PM Medical Record Number: [id number] Patient Account Number: [id number] Date of Birth/Sex: April 06, 1926 (84 y.o. M) Treating RN: Secundino Ginger Primary Care Nikol Lemar: Ramonita Lab Other Clinician: Referring Jeren Dufrane: Ramonita Lab Treating Kentavious Michele/Extender: Melburn Hake, HOYT Weeks in Treatment:  29 Electronic Signature(s) Signed: 03/14/2018 4:32:43 PM By: Secundino Ginger Entered By: Secundino Ginger on 03/14/2018 14:11:18 Rendell, Kenedy ([id number]) -------------------------------------------------------------------------------- Multi Wound Chart Details Patient Name: [name], [name] Date of Service: 03/14/2018 2:00 PM Medical Record Number: [id number] Patient Account Number: [id number] Date of Birth/Sex: 11-13-1925 (84 y.o. M) Treating RN: Montey Hora Primary Care Marek Nghiem: Ramonita Lab Other Clinician: Referring Britanee Vanblarcom: Ramonita Lab Treating Maxwell Martorano/Extender: Melburn Hake, HOYT Weeks in Treatment: 29 Vital Signs Height(in): 70 Pulse(bpm): 91 Weight(lbs): 190 Blood Pressure(mmHg): 122/72 Body Mass Index(BMI): 27 Temperature(F): 97.6 Respiratory Rate 16 (breaths/min): Photos: [N/A:N/A] Wound Location: Left Calcaneus N/A N/A Wounding Event: Pressure Injury N/A N/A Primary Etiology: Pressure Ulcer N/A N/A Comorbid History: Arrhythmia, Hypertension, N/A N/A Received Radiation Date Acquired: 08/12/2017 N/A N/A Weeks of Treatment: 29 N/A N/A Wound Status: Open N/A N/A Measurements L x W x D 0.2x0.2x1 N/A N/A (cm) Area (cm) : 0.031 N/A N/A Volume (cm) : 0.031 N/A N/A % Reduction in Area: 99.90% N/A N/A % Reduction in Volume: 99.30% N/A N/A Classification: Category/Stage III N/A N/A Exudate Amount: Small N/A N/A Exudate Type: Serous N/A N/A Exudate Color: amber N/A N/A Wound Margin: Distinct, outline attached N/A N/A Granulation Amount: None Present (0%) N/A N/A Necrotic Amount: None Present (0%) N/A N/A Exposed Structures: Fat Layer (Subcutaneous N/A N/A Tissue) Exposed: Yes Fascia: No Tendon: No Muscle: No Joint: No Bone: No Epithelialization: None N/A N/A [name], [name] ([id number]) Periwound Skin Texture: Excoriation: Yes N/A N/A Callus: Yes Induration: No Crepitus: No Rash: No Scarring: No Periwound Skin Moisture: Maceration: No N/A N/A Dry/Scaly:  No Periwound Skin Color: No Abnormalities Noted N/A N/A Temperature: No Abnormality N/A N/A Tenderness on Palpation: Yes N/A N/A Wound Preparation: Ulcer Cleansing: N/A N/A Rinsed/Irrigated with Saline Topical Anesthetic Applied: Other: lidocaine 4% Treatment Notes Electronic Signature(s) Signed: 03/14/2018 5:28:07 PM By: Montey Hora Entered By: Montey Hora on 03/14/2018 14:53:40 [name], [name] ([id number]) -------------------------------------------------------------------------------- [name] Details Patient Name: [name], KRINKE. Date of  Service: 03/14/2018 2:00 PM Medical Record Number: [id number] Patient Account Number: [id number] Date of Birth/Sex: October 05, 1925 (84 y.o. M) Treating RN: Montey Hora Primary Care Ciarra Braddy: Ramonita Lab Other Clinician: Referring Sady Monaco: Ramonita Lab Treating Zaylei Mullane/Extender: Melburn Hake, HOYT Weeks in Treatment: 77 Active Inactive ` Abuse / Safety / Falls / Self Care Management Nursing Diagnoses: History of Falls Potential for falls Goals: Patient will not experience any injury related to falls Date Initiated: 08/19/2017 Target Resolution Date: 12/14/2017 Goal Status: Active Interventions: Assess Activities of Daily Living upon admission and as needed Assess fall risk on admission and as needed Assess: immobility, friction, shearing, incontinence upon admission and as needed Assess impairment of mobility on admission and as needed per policy Assess personal safety and home safety (as indicated) on admission and as needed Assess self care needs on admission and as needed Notes: ` Nutrition Nursing Diagnoses: Imbalanced nutrition Potential for alteratiion in Nutrition/Potential for imbalanced nutrition Goals: Patient/caregiver agrees to and verbalizes understanding of need to use nutritional supplements and/or vitamins as prescribed Date Initiated: 08/19/2017 Target Resolution Date: 11/16/2017 Goal Status:  Active Interventions: Assess patient nutrition upon admission and as needed per policy Notes: ` Orientation to the Wound Care Program Nursing Diagnoses: Morganti, Marquay L. ([id number]) Knowledge deficit related to the wound healing center program Goals: Patient/caregiver will verbalize understanding of the [place] Date Initiated: 08/19/2017 Target Resolution Date: 09/14/2017 Goal Status: Active Interventions: Provide education on orientation to the wound center Notes: ` Pain, Acute or Chronic Nursing Diagnoses: Pain, acute or chronic: actual or potential Potential alteration in comfort, pain Goals: Patient/caregiver will verbalize adequate pain control between visits Date Initiated: 08/19/2017 Target Resolution Date: 12/14/2017 Goal Status: Active Interventions: Complete pain assessment as per visit requirements Notes: ` Pressure Nursing Diagnoses: Knowledge deficit related to causes and risk factors for pressure ulcer development Knowledge deficit related to management of pressures ulcers Potential for impaired tissue integrity related to pressure, friction, moisture, and shear Goals: Patient will remain free from development of additional pressure ulcers Date Initiated: 08/19/2017 Target Resolution Date: 12/14/2017 Goal Status: Active Interventions: Assess: immobility, friction, shearing, incontinence upon admission and as needed Assess potential for pressure ulcer upon admission and as needed Notes: ` Wound/Skin Impairment Nursing Diagnoses: Impaired tissue integrity Knowledge deficit related to ulceration/compromised skin integrity [name], [name] ([id number]) Goals: Ulcer/skin breakdown will have a volume reduction of 80% by week 12 Date Initiated: 08/19/2017 Target Resolution Date: 12/14/2017 Goal Status: Active Interventions: Assess patient/caregiver ability to perform ulcer/skin care regimen upon admission and as needed Assess ulceration(s)  every visit Notes: Electronic Signature(s) Signed: 03/14/2018 5:28:07 PM By: Montey Hora Entered By: Montey Hora on 03/14/2018 14:53:34 [name], [name] ([id number]) -------------------------------------------------------------------------------- Pain Assessment Details Patient Name: [name], [name] Date of Service: 03/14/2018 2:00 PM Medical Record Number: [id number] Patient Account Number: [id number] Date of Birth/Sex: 12/21/1925 (84 y.o. M) Treating RN: Montey Hora Primary Care Secily Walthour: Ramonita Lab Other Clinician: Referring Slayton Lubitz: Ramonita Lab Treating Edith Lord/Extender: Melburn Hake, HOYT Weeks in Treatment: 29 Active Problems Location of Pain Severity and Description of Pain Patient Has Paino No Site Locations Pain Management and Medication Current Pain Management: Electronic Signature(s) Signed: 03/14/2018 5:00:18 PM By: Paulla Fore, RRT, CHT Signed: 03/14/2018 5:28:07 PM By: Montey Hora Entered By: Lorine Bears on 03/14/2018 14:04:48 [name], [name] ([id number]) -------------------------------------------------------------------------------- Wound Assessment Details Patient Name: [name], [name] Date of Service: 03/14/2018 2:00 PM Medical Record Number: [id number] Patient Account Number: [id number] Date of Birth/Sex: [birth year] (84 y.o. M)  Treating RN: Secundino Ginger Primary Care Romone Shaff: Ramonita Lab Other Clinician: Referring Vincie Linn: Ramonita Lab Treating Charlyne Robertshaw/Extender: Melburn Hake, HOYT Weeks in Treatment: 29 Wound Status Wound Number: 1 Primary Etiology: Pressure Ulcer Wound Location: Left Calcaneus Wound Status: Open Wounding Event: Pressure Injury Comorbid Arrhythmia, Hypertension, Received History: Radiation Date Acquired: 08/12/2017 Weeks Of Treatment: 29 Clustered Wound: No Photos Photo Uploaded By: Secundino Ginger on 03/14/2018 14:30:23 Wound Measurements Length: (cm) 0.2 % Reduction Width: (cm) 0.2 % Reduction Depth:  (cm) 1 Epitheliali Area: (cm) 0.031 Tunneling: Volume: (cm) 0.031 Underminin in Area: 99.9% in Volume: 99.3% zation: None No g: No Wound Description Classification: Category/Stage III Foul Odor A Wound Margin: Distinct, outline attached Slough/Fibr Exudate Amount: Small Exudate Type: Serous Exudate Color: amber fter Cleansing: No ino No Wound Bed Granulation Amount: None Present (0%) Exposed Structure Necrotic Amount: None Present (0%) Fascia Exposed: No Fat Layer (Subcutaneous Tissue) Exposed: Yes Tendon Exposed: No Muscle Exposed: No Joint Exposed: No Bone Exposed: No Periwound Skin Texture Alix, Killian L. ([id number]) Texture Color No Abnormalities Noted: No No Abnormalities Noted: Yes Callus: Yes Temperature / Pain Crepitus: No Temperature: No Abnormality Excoriation: Yes Tenderness on Palpation: Yes Induration: No Rash: No Scarring: No Moisture No Abnormalities Noted: No Dry / Scaly: No Maceration: No Wound Preparation Ulcer Cleansing: Rinsed/Irrigated with Saline Topical Anesthetic Applied: Other: lidocaine 4%, Electronic Signature(s) Signed: 03/14/2018 4:32:43 PM By: Secundino Ginger Entered By: Secundino Ginger on 03/14/2018 14:18:22 Lender, [place] ([id number]) -------------------------------------------------------------------------------- Vitals Details Patient Name: [name], [name] Date of Service: 03/14/2018 2:00 PM Medical Record Number: [id number] Patient Account Number: [id number] Date of Birth/Sex: 13-Jun-1926 (84 y.o. M) Treating RN: Montey Hora Primary Care Shoshannah Faubert: Ramonita Lab Other Clinician: Referring Jamien Casanova: Ramonita Lab Treating Diesha Rostad/Extender: Melburn Hake, HOYT Weeks in Treatment: 29 Vital Signs Time Taken: 14:04 Temperature (F): 97.6 Height (in): 70 Pulse (bpm): 91 Weight (lbs): 190 Respiratory Rate (breaths/min): 16 Body Mass Index (BMI): 27.3 Blood Pressure (mmHg): 122/72 Reference Range: 80 - 120 mg / dl Electronic  Signature(s) Signed: 03/14/2018 5:00:18 PM By: Lorine Bears RCP, RRT, CHT Entered By: Lorine Bears on 03/14/2018 14:06:51

## 2018-03-17 DIAGNOSIS — I482 Chronic atrial fibrillation: Secondary | ICD-10-CM | POA: Diagnosis not present

## 2018-03-17 DIAGNOSIS — Z79891 Long term (current) use of opiate analgesic: Secondary | ICD-10-CM | POA: Diagnosis not present

## 2018-03-17 DIAGNOSIS — Z9181 History of falling: Secondary | ICD-10-CM | POA: Diagnosis not present

## 2018-03-17 DIAGNOSIS — I10 Essential (primary) hypertension: Secondary | ICD-10-CM | POA: Diagnosis not present

## 2018-03-17 DIAGNOSIS — Z7901 Long term (current) use of anticoagulants: Secondary | ICD-10-CM | POA: Diagnosis not present

## 2018-03-17 DIAGNOSIS — L89623 Pressure ulcer of left heel, stage 3: Secondary | ICD-10-CM | POA: Diagnosis not present

## 2018-03-18 NOTE — Progress Notes (Signed)
[name], [name] ([id number]) Visit Report for 03/14/2018 Chief Complaint Document Details Patient Name: [name], [name] Date of Service: 03/14/2018 2:00 PM Medical Record Number: [id number] Patient Account Number: [id number] Date of Birth/Sex: Oct 17, 1925 (84 y.o. M) Treating RN: Montey Hora Primary Care Provider: Ramonita Lab Other Clinician: Referring Provider: Ramonita Lab Treating Provider/Extender: Melburn Hake, HOYT Weeks in Treatment: 35 Information Obtained from: Patient Chief Complaint Bilateral Heel pressure ulcers and bilateral great toe DTIs Electronic Signature(s) Signed: 03/16/2018 9:18:35 AM By: Worthy Keeler PA-[name] Entered By: Worthy Keeler on 03/14/2018 13:41:26 [name], [name] ([id number]) -------------------------------------------------------------------------------- Debridement Details Patient Name: [name], [name] Date of Service: 03/14/2018 2:00 PM Medical Record Number: [id number] Patient Account Number: [id number] Date of Birth/Sex: March 29, 1926 (84 y.o. M) Treating RN: Montey Hora Primary Care Provider: Ramonita Lab Other Clinician: Referring Provider: Ramonita Lab Treating Provider/Extender: Melburn Hake, HOYT Weeks in Treatment: 29 Debridement Performed for Wound #1 Left Calcaneus Assessment: Performed By: Physician STONE III, HOYT E., PA-[name] Debridement Type: Debridement Pre-procedure Verification/Time Yes - 14:54 Out Taken: Start Time: 14:54 Pain Control: Lidocaine 4% Topical Solution Total Area Debrided (L x W): 0.2 (cm) x 0.2 (cm) = 0.04 (cm) Tissue and other material Viable, Non-Viable, Callus, Slough, Subcutaneous, Slough debrided: Level: Skin/Subcutaneous Tissue Debridement Description: Excisional Instrument: Curette Bleeding: Minimum Hemostasis Achieved: Pressure End Time: 14:57 Procedural Pain: 0 Post Procedural Pain: 0 Response to Treatment: Procedure was tolerated well Level of Consciousness: Awake and Alert Post Debridement Measurements  of Total Wound Length: (cm) 0.2 Stage: Category/Stage III Width: (cm) 0.2 Depth: (cm) 1.2 Volume: (cm) 0.038 Character of Wound/Ulcer Post Improved Debridement: Post Procedure Diagnosis Same as Pre-procedure Electronic Signature(s) Signed: 03/14/2018 5:28:07 PM By: Montey Hora Signed: 03/16/2018 9:18:35 AM By: Worthy Keeler PA-[name] Entered By: Montey Hora on 03/14/2018 14:57:06 [name], [name] ([id number]) -------------------------------------------------------------------------------- HPI Details Patient Name: [name], [name] Date of Service: 03/14/2018 2:00 PM Medical Record Number: [id number] Patient Account Number: [id number] Date of Birth/Sex: April 18, 1926 (84 y.o. M) Treating RN: Montey Hora Primary Care Provider: Ramonita Lab Other Clinician: Referring Provider: Ramonita Lab Treating Provider/Extender: Melburn Hake, HOYT Weeks in Treatment: 29 History of Present Illness Associated Signs and Symptoms: Patient has a history of chronic atrophic relation, hypertension, and long-term use of anticoagulants. He also had a fall with pelvic fracture which occurred on January 2019. HPI Description: 08/19/17 patient presents today for initial evaluation and our clinic concerning issues he has been having with the bilateral heels as well as the lateral great toe was since he was admitted to the nursing facility following a pelvic fracture. Initially he was spending a significant amount of time in the bed although he is now up more often since the healing process has progressed along the way obviously. When he is up he's spending most of the time sitting in a wheelchair at this point. When he is in bed he lays on his back and the covers/sheets are pushing down on his toes bilaterally which I think it may be what's causing the issues with his deep tissue injury to the bilateral great toes. With that being said the hills also appear to be pressure in nature and I think that laying in the bed  getting pressure to the sites is what has led to the formation of these on stage will pressure ulcers. Fortunately there does not appear to be any infection in both areas of ulceration of the heel appear to be stable at this point. Patient does not have any significant pain  at the site she does have some of the eschar along the edges which is starting to lift up and come all hopefully slowly but surely this will occur. Patient did have arterial studies performed April 2018 which showed a normal TBI on the right knee slightly depressed TBI on the left but this does not appear to have changed significantly at that point. Specifically this was 0.75 on the right and 0.61 on the left. 09/13/17 on evaluation today patient presents for a follow-up evaluation regarding his bilateral heal ulcers. He has been tolerating the dressing changes fairly well although he did have some bleeding earlier today when it appears some of the skin on the surrounding portion of the wound got pulled where this is starting to flake off. Subsequently there does not appear to be any bleeding right now. Since I last saw him he has been discharged from the facility and is now back at home. His wife is having a very difficult time being able to get him up and in the car in order to come for an appointment which is why they missed the last appointment have not been able to come back sooner. Fortunately the he does not have any signs of infection. No fevers chills noted. He does have difficulty walking due to pain in the bilateral heels. 10/11/17 on evaluation today patient presents for follow-up evaluation concerning his ongoing bilateral heal ulcers. He has been tolerating the dressing changes which were Betadine paints daily without complication. I have not seen him since September 13, 2017. Pressure that I saw him August 19, 2017. Subsequently those are the only prior visits before today. His left heel actually seems to have a  significant amount of eschar that is starting to lift up at this point and seems to be a little more loose as far as drainage building up underneath the eschar. The right eschar though there are some parts lifting up seems to still be fairly stable which is good news. His toe ulcer is almost completely healed a lot of that eschar came off and it looks very well u 01/06/18 on evaluation today patient actually appears to be doing excellent in regard to his bilateral heal ulcers. The right heel is very close to completely closing and overall this is doing excellent. Left heel #2 great improvement as it has been for the past several weeks. I'm extremely pleased in this regard. Overall the patient is also happy to his wife seems to be doing an excellent job caring for him in my pinion. 01/14/18 on evaluation today patient actually appears to be doing better in regard to both heal ulcers. The right heel is almost completely healed there was some callous buildup around the edge of the wound but overall I feel like things are progressing quite nicely. In regard to the left heel also feel like there's good epithelialization however this has been obviously somewhat slower it was also much larger initially when the eschar was removed so again I'm not really too worried about it I think it's progressing in a good fashion. Overall I feel like the patient is doing excellent. 01/21/18 on evaluation today patient appears to actually be doing rather well in regard to his lower extremity heels. His right lower extremity actually was almost completely healed although there was a very small opening still remaining that was noted during debridement today. Nonetheless overall I feel like he is making good progress. No fevers, chills, nausea, or vomiting noted at this time.  Goette, Harjot L. ([id number]) 01/28/18 on evaluation today patient's right heel ulcer appears to be all but closed. There still just a very small almost  pinpoint opening at this point overall I'm very pleased with how things have progressed. The left ulcer is still more open although there is definitely signs of epithelialization even since last week I feel like things are improving. 02/04/18 on evaluation today patient appears to actually be doing very well in regard to his right field ulcer this appears to be completely healed in fact which is excellent news. In regard to left heel ulcer this is smaller although not completely close to yet. Nonetheless he seemed to be making great progress. 02/11/18 He is here for follow up evaluation for a left heel stage III pressure ulcer. It is stable. He continues with offloading with ambulation and with rest. We have instructed patient's wife to pack prisma into all areas and depth. they are requesting a two week follow up 02/25/18 on evaluation today patient appears to be doing much better in regard to his left heel ulcer. The right heel still completely close and appears to be doing excellent. The patient has been tolerating the dressing changes without complication his wife still continues to perform these for him. Overall I think he is doing excellent and has made wonderful progress since we began taking care of his heels. Nonetheless it has obviously been quite some time we are now at the 27 week mark. 03/14/18 on evaluation today patient's left heel which is the only remaining ulceration at this point seems to be doing very well. There is significant improvement and new epithelialization since last time I seen him. With that being said he continues to have some opening at what was the deepest portion of the wound all along fortunately though this seems to be closing in and healing up quite nicely. I still believe is gonna take a little bit of time longer before this completely closes. Electronic Signature(s) Signed: 03/16/2018 9:18:35 AM By: Worthy Keeler PA-[name] Entered By: Worthy Keeler on 03/15/2018  01:00:52 [name], [name] ([id number]) -------------------------------------------------------------------------------- Physical Exam Details Patient Name: Antos, Albaraa L. Date of Service: 03/14/2018 2:00 PM Medical Record Number: [id number] Patient Account Number: [id number] Date of Birth/Sex: 1925/12/29 (84 y.o. M) Treating RN: Montey Hora Primary Care Provider: Ramonita Lab Other Clinician: Referring Provider: Ramonita Lab Treating Provider/Extender: Melburn Hake, HOYT Weeks in Treatment: 61 Constitutional Well-nourished and well-hydrated in no acute distress. Respiratory normal breathing without difficulty. Psychiatric this patient is able to make decisions and demonstrates good insight into disease process. Alert and Oriented x 3. pleasant and cooperative. Notes Patient's wound bed actually showed signs of some Slough noted but in general there was a lot of really good granulation which is excellent news. He continues to tolerate the dressing changes his wife continues to perform these for him. Electronic Signature(s) Signed: 03/16/2018 9:18:35 AM By: Worthy Keeler PA-[name] Entered By: Worthy Keeler on 03/15/2018 01:01:29 [place]ck[place]ie, [place]on ([id number]) -------------------------------------------------------------------------------- Physician Orders Details Patient Name: LUCAN, RINER L. Date of Service: 03/14/2018 2:00 PM Medical Record Number: [id number] Patient Account Number: [id number] Date of Birth/Sex: 03-08-1926 (84 y.o. M) Treating RN: Montey Hora Primary Care Provider: Ramonita Lab Other Clinician: Referring Provider: Ramonita Lab Treating Provider/Extender: Melburn Hake, HOYT Weeks in Treatment: 31 Verbal / Phone Orders: No Diagnosis Coding ICD-10 Coding Code Description L89.623 Pressure ulcer of left heel, stage 3 I48.2 Chronic atrial fibrillation I10 Essential (primary) hypertension Z79.01 Long term (current)  use of anticoagulants Wound Cleansing Wound #1 Left  Calcaneus o Clean wound with Normal Saline. o Cleanse wound with mild soap and water o May Shower, gently pat wound dry prior to applying new dressing. Anesthetic (add to Medication List) Wound #1 Left Calcaneus o Topical Lidocaine 4% cream applied to wound bed prior to debridement (In Clinic Only). Skin Barriers/Peri-Wound Care Wound #1 Left Calcaneus o Skin Prep Primary Wound Dressing Wound #1 Left Calcaneus o Silver Alginate - place over the Prisma Ag o Silver Collagen - lightly moisten with saline Secondary Dressing Wound #1 Left Calcaneus o Boardered Foam Dressing Dressing Change Frequency Wound #1 Left Calcaneus o Change dressing every other day. Follow-up Appointments Wound #1 Left Calcaneus o Return Appointment in 3 weeks. Off-Loading Wound #1 Left Calcaneus [name], [name] ([id number]) o Turn and reposition every 2 hours o Other: - float heels at bedtime Additional Orders / Instructions Wound #1 Left Calcaneus o Increase protein intake. Electronic Signature(s) Signed: 03/14/2018 5:28:07 PM By: Montey Hora Signed: 03/16/2018 9:18:35 AM By: Worthy Keeler PA-[name] Entered By: Montey Hora on 03/14/2018 14:58:32 [name], [name] ([id number]) -------------------------------------------------------------------------------- Problem List Details Patient Name: [name], [name] Date of Service: 03/14/2018 2:00 PM Medical Record Number: [id number] Patient Account Number: [id number] Date of Birth/Sex: 1925-08-03 (84 y.o. M) Treating RN: Montey Hora Primary Care Provider: Ramonita Lab Other Clinician: Referring Provider: Ramonita Lab Treating Provider/Extender: Melburn Hake, HOYT Weeks in Treatment: 29 Active Problems ICD-10 Evaluated Encounter Code Description Active Date Today Diagnosis L89.623 Pressure ulcer of left heel, stage 3 08/19/2017 No Yes I48.2 Chronic atrial fibrillation 08/19/2017 No Yes I10 Essential (primary) hypertension 08/19/2017  No Yes Z79.01 Long term (current) use of anticoagulants 08/19/2017 No Yes Inactive Problems Resolved Problems ICD-10 Code Description Active Date Resolved Date L89.613 Pressure ulcer of right heel, stage 3 08/19/2017 08/19/2017 Electronic Signature(s) Signed: 03/16/2018 9:18:35 AM By: Worthy Keeler PA-[name] Entered By: Worthy Keeler on 03/14/2018 13:41:20 [name], [name] ([id number]) -------------------------------------------------------------------------------- Progress Note Details Patient Name: [name], [name] Date of Service: 03/14/2018 2:00 PM Medical Record Number: [id number] Patient Account Number: [id number] Date of Birth/Sex: 1926/01/13 (84 y.o. M) Treating RN: Montey Hora Primary Care Provider: Ramonita Lab Other Clinician: Referring Provider: Ramonita Lab Treating Provider/Extender: Melburn Hake, HOYT Weeks in Treatment: 29 Subjective Chief Complaint Information obtained from Patient Bilateral Heel pressure ulcers and bilateral great toe DTIs History of Present Illness (HPI) The following HPI elements were documented for the patient's wound: Associated Signs and Symptoms: Patient has a history of chronic atrophic relation, hypertension, and long-term use of anticoagulants. He also had a fall with pelvic fracture which occurred on January 2019. 08/19/17 patient presents today for initial evaluation and our clinic concerning issues he has been having with the bilateral heels as well as the lateral great toe was since he was admitted to the nursing facility following a pelvic fracture. Initially he was spending a significant amount of time in the bed although he is now up more often since the healing process has progressed along the way obviously. When he is up he's spending most of the time sitting in a wheelchair at this point. When he is in bed he lays on his back and the covers/sheets are pushing down on his toes bilaterally which I think it may be what's causing the issues with  his deep tissue injury to the bilateral great toes. With that being said the hills also appear to be pressure in nature and I think that laying in  the bed getting pressure to the sites is what has led to the formation of these on stage will pressure ulcers. Fortunately there does not appear to be any infection in both areas of ulceration of the heel appear to be stable at this point. Patient does not have any significant pain at the site she does have some of the eschar along the edges which is starting to lift up and come all hopefully slowly but surely this will occur. Patient did have arterial studies performed April 2018 which showed a normal TBI on the right knee slightly depressed TBI on the left but this does not appear to have changed significantly at that point. Specifically this was 0.75 on the right and 0.61 on the left. 09/13/17 on evaluation today patient presents for a follow-up evaluation regarding his bilateral heal ulcers. He has been tolerating the dressing changes fairly well although he did have some bleeding earlier today when it appears some of the skin on the surrounding portion of the wound got pulled where this is starting to flake off. Subsequently there does not appear to be any bleeding right now. Since I last saw him he has been discharged from the facility and is now back at home. His wife is having a very difficult time being able to get him up and in the car in order to come for an appointment which is why they missed the last appointment have not been able to come back sooner. Fortunately the he does not have any signs of infection. No fevers chills noted. He does have difficulty walking due to pain in the bilateral heels. 10/11/17 on evaluation today patient presents for follow-up evaluation concerning his ongoing bilateral heal ulcers. He has been tolerating the dressing changes which were Betadine paints daily without complication. I have not seen him since September 13, 2017. Pressure that I saw him August 19, 2017. Subsequently those are the only prior visits before today. His left heel actually seems to have a significant amount of eschar that is starting to lift up at this point and seems to be a little more loose as far as drainage building up underneath the eschar. The right eschar though there are some parts lifting up seems to still be fairly stable which is good news. His toe ulcer is almost completely healed a lot of that eschar came off and it looks very well u 01/06/18 on evaluation today patient actually appears to be doing excellent in regard to his bilateral heal ulcers. The right heel is very close to completely closing and overall this is doing excellent. Left heel #2 great improvement as it has been for the past several weeks. I'm extremely pleased in this regard. Overall the patient is also happy to his wife seems to be doing an excellent job caring for him in my pinion. 01/14/18 on evaluation today patient actually appears to be doing better in regard to both heal ulcers. The right heel is almost completely healed there was some callous buildup around the edge of the wound but overall I feel like things are progressing Rase, [place] ([id number]) quite nicely. In regard to the left heel also feel like there's good epithelialization however this has been obviously somewhat slower it was also much larger initially when the eschar was removed so again I'm not really too worried about it I think it's progressing in a good fashion. Overall I feel like the patient is doing excellent. 01/21/18 on evaluation today patient appears to  actually be doing rather well in regard to his lower extremity heels. His right lower extremity actually was almost completely healed although there was a very small opening still remaining that was noted during debridement today. Nonetheless overall I feel like he is making good progress. No fevers, chills, nausea, or  vomiting noted at this time. 01/28/18 on evaluation today patient's right heel ulcer appears to be all but closed. There still just a very small almost pinpoint opening at this point overall I'm very pleased with how things have progressed. The left ulcer is still more open although there is definitely signs of epithelialization even since last week I feel like things are improving. 02/04/18 on evaluation today patient appears to actually be doing very well in regard to his right field ulcer this appears to be completely healed in fact which is excellent news. In regard to left heel ulcer this is smaller although not completely close to yet. Nonetheless he seemed to be making great progress. 02/11/18 He is here for follow up evaluation for a left heel stage III pressure ulcer. It is stable. He continues with offloading with ambulation and with rest. We have instructed patient's wife to pack prisma into all areas and depth. they are requesting a two week follow up 02/25/18 on evaluation today patient appears to be doing much better in regard to his left heel ulcer. The right heel still completely close and appears to be doing excellent. The patient has been tolerating the dressing changes without complication his wife still continues to perform these for him. Overall I think he is doing excellent and has made wonderful progress since we began taking care of his heels. Nonetheless it has obviously been quite some time we are now at the 27 week mark. 03/14/18 on evaluation today patient's left heel which is the only remaining ulceration at this point seems to be doing very well. There is significant improvement and new epithelialization since last time I seen him. With that being said he continues to have some opening at what was the deepest portion of the wound all along fortunately though this seems to be closing in and healing up quite nicely. I still believe is gonna take a little bit of time longer  before this completely closes. Patient History Information obtained from Patient. Family History No family history of Cancer, Diabetes, Heart Disease, Hereditary Spherocytosis, Hypertension, Kidney Disease, Lung Disease, Seizures, Stroke, Thyroid Problems, Tuberculosis. Social History Former smoker, Marital Status - Married, Alcohol Use - Rarely - 2oz day, Drug Use - No History, Caffeine Use - Daily. Review of Systems (ROS) Constitutional Symptoms (General Health) Denies complaints or symptoms of Fever, Chills. Respiratory The patient has no complaints or symptoms. Cardiovascular The patient has no complaints or symptoms. Psychiatric The patient has no complaints or symptoms. [name], [name] ([id number]) Objective Constitutional Well-nourished and well-hydrated in no acute distress. Vitals Time Taken: 2:04 PM, Height: 70 in, Weight: 190 lbs, BMI: 27.3, Temperature: 97.6 F, Pulse: 91 bpm, Respiratory Rate: 16 breaths/min, Blood Pressure: 122/72 mmHg. Respiratory normal breathing without difficulty. Psychiatric this patient is able to make decisions and demonstrates good insight into disease process. Alert and Oriented x 3. pleasant and cooperative. General Notes: Patient's wound bed actually showed signs of some Slough noted but in general there was a lot of really good granulation which is excellent news. He continues to tolerate the dressing changes his wife continues to perform these for him. Integumentary (Hair, Skin) Wound #1 status is Open. Original  cause of wound was Pressure Injury. The wound is located on the Left Calcaneus. The wound measures 0.2cm length x 0.2cm width x 1cm depth; 0.031cm^2 area and 0.031cm^3 volume. There is Fat Layer (Subcutaneous Tissue) Exposed exposed. There is no tunneling or undermining noted. There is a small amount of serous drainage noted. The wound margin is distinct with the outline attached to the wound base. There is no granulation within  the wound bed. There is no necrotic tissue within the wound bed. The periwound skin appearance had no abnormalities noted for color. The periwound skin appearance exhibited: Callus, Excoriation. The periwound skin appearance did not exhibit: Crepitus, Induration, Rash, Scarring, Dry/Scaly, Maceration. Periwound temperature was noted as No Abnormality. The periwound has tenderness on palpation. Assessment Active Problems ICD-10 Pressure ulcer of left heel, stage 3 Chronic atrial fibrillation Essential (primary) hypertension Long term (current) use of anticoagulants Procedures Wound #1 Pre-procedure diagnosis of Wound #1 is a Pressure Ulcer located on the Left Calcaneus . There was a Excisional Skin/Subcutaneous Tissue Debridement with a total area of 0.04 sq cm performed by STONE III, HOYT E., PA-[name]. With the following instrument(s): Curette to remove Viable and Non-Viable tissue/material. Material removed includes Callus, Subcutaneous Tissue, and Slough after achieving pain control using Lidocaine 4% Topical Solution. No specimens were taken. A time out was conducted at 14:54, prior to the start of the procedure. A Minimum amount of bleeding was controlled [name], [name] ([id number]) with Pressure. The procedure was tolerated well with a pain level of 0 throughout and a pain level of 0 following the procedure. Patient s Level of Consciousness post procedure was recorded as Awake and Alert. Post Debridement Measurements: 0.2cm length x 0.2cm width x 1.2cm depth; 0.038cm^3 volume. Post debridement Stage noted as Category/Stage III. Character of Wound/Ulcer Post Debridement is improved. Post procedure Diagnosis Wound #1: Same as Pre-Procedure Plan Wound Cleansing: Wound #1 Left Calcaneus: Clean wound with Normal Saline. Cleanse wound with mild soap and water May Shower, gently pat wound dry prior to applying new dressing. Anesthetic (add to Medication List): Wound #1 Left  Calcaneus: Topical Lidocaine 4% cream applied to wound bed prior to debridement (In Clinic Only). Skin Barriers/Peri-Wound Care: Wound #1 Left Calcaneus: Skin Prep Primary Wound Dressing: Wound #1 Left Calcaneus: Silver Alginate - place over the Prisma Ag Silver Collagen - lightly moisten with saline Secondary Dressing: Wound #1 Left Calcaneus: Boardered Foam Dressing Dressing Change Frequency: Wound #1 Left Calcaneus: Change dressing every other day. Follow-up Appointments: Wound #1 Left Calcaneus: Return Appointment in 3 weeks. Off-Loading: Wound #1 Left Calcaneus: Turn and reposition every 2 hours Other: - float heels at bedtime Additional Orders / Instructions: Wound #1 Left Calcaneus: Increase protein intake. Currently I did perform sharp debridement in regard to the left heel ulcer he tolerated this without complication post debridement the wound bed appears to be much better. We will see were things stand at follow-up. Please see above for specific wound care orders. We will see patient for re-evaluation in 3 week(s) here in the clinic. If anything worsens or changes patient will contact our office for additional recommendations. Electronic Signature(s) Signed: 03/16/2018 9:18:35 AM By: Worthy Keeler PA-[name] Soledad, [place] ([id number]) Entered By: Worthy Keeler on 03/15/2018 01:02:08 [place], [place] ([id number]) -------------------------------------------------------------------------------- ROS/PFSH Details Patient Name: CEN, [name] Date of Service: 03/14/2018 2:00 PM Medical Record Number: [id number] Patient Account Number: [id number] Date of Birth/Sex: [birth year] (84 y.o. M) Treating RN: Montey Hora Primary Care Provider: Ramonita Lab Other  Clinician: Referring Provider: Ramonita Lab Treating Provider/Extender: Melburn Hake, HOYT Weeks in Treatment: 72 Information Obtained From Patient Wound History Do you currently have one or more open woundso Yes How  many open wounds do you currently haveo 2 Approximately how long have you had your woundso 1 week How have you been treating your wound(s) until nowo nothing Has your wound(s) ever healed and then re-openedo No Have you had any lab work done in the past montho No Have you tested positive for osteomyelitis (bone infection)o No Have you had any tests for circulation on your legso No Have you had other problems associated with your woundso Swelling Constitutional Symptoms (General Health) Complaints and Symptoms: Negative for: Fever; Chills Respiratory Complaints and Symptoms: No Complaints or Symptoms Cardiovascular Complaints and Symptoms: No Complaints or Symptoms Medical History: Positive for: Arrhythmia - a-fib; Hypertension Oncologic Medical History: Positive for: Received Radiation Psychiatric Complaints and Symptoms: No Complaints or Symptoms Immunizations Pneumococcal Vaccine: Received Pneumococcal Vaccination: Yes Implantable Devices Family and Social History Sedivy, Chanze L. ([id number]) Cancer: No; Diabetes: No; Heart Disease: No; Hereditary Spherocytosis: No; Hypertension: No; Kidney Disease: No; Lung Disease: No; Seizures: No; Stroke: No; Thyroid Problems: No; Tuberculosis: No; Former smoker; Marital Status - Married; Alcohol Use: Rarely - 2oz day; Drug Use: No History; Caffeine Use: Daily; Financial Concerns: No; Food, Clothing or Shelter Needs: No; Support System Lacking: No; Transportation Concerns: No; Advanced Directives: Yes (Not Provided); Patient does not want information on Advanced Directives; Do not resuscitate: No; Living Will: Yes (Not Provided); Medical Power of Attorney: No Physician Affirmation I have reviewed and agree with the above information. Electronic Signature(s) Signed: 03/16/2018 9:18:35 AM By: Worthy Keeler PA-[name] Signed: 03/17/2018 5:04:31 PM By: Montey Hora Entered By: Worthy Keeler on 03/15/2018 01:01:11 Stjames, Divine L.  ([id number]) -------------------------------------------------------------------------------- SuperBill Details Patient Name: KIRALY, Teryn L. Date of Service: 03/14/2018 Medical Record Number: [id number] Patient Account Number: [id number] Date of Birth/Sex: 07-Sep-1925 (84 y.o. M) Treating RN: Montey Hora Primary Care Provider: Ramonita Lab Other Clinician: Referring Provider: Ramonita Lab Treating Provider/Extender: Melburn Hake, HOYT Weeks in Treatment: 29 Diagnosis Coding ICD-10 Codes Code Description 6506074598 Pressure ulcer of left heel, stage 3 I48.2 Chronic atrial fibrillation I10 Essential (primary) hypertension Z79.01 Long term (current) use of anticoagulants Facility Procedures CPT4 Code: 76811572 Description: 62035 - DEB SUBQ TISSUE 20 SQ CM/< ICD-10 Diagnosis Description L89.623 Pressure ulcer of left heel, stage 3 Modifier: Quantity: 1 Physician Procedures CPT4 Code: [id number] Description: 84536 - WC PHYS SUBQ TISS 20 SQ CM ICD-10 Diagnosis Description L89.623 Pressure ulcer of left heel, stage 3 Modifier: Quantity: 1 Electronic Signature(s) Signed: 03/16/2018 9:18:35 AM By: Worthy Keeler PA-[name] Entered By: Worthy Keeler on 03/15/2018 01:02:23

## 2018-03-23 ENCOUNTER — Inpatient Hospital Stay
Admission: EM | Admit: 2018-03-23 | Discharge: 2018-03-25 | DRG: 872 | Disposition: A | Discharge status: Home Health | Payer: PPO | Attending: Internal Medicine | Admitting: Internal Medicine

## 2018-03-23 ENCOUNTER — Inpatient Hospital Stay: Payer: PPO

## 2018-03-23 ENCOUNTER — Emergency Department: Payer: PPO

## 2018-03-23 ENCOUNTER — Other Ambulatory Visit: Payer: Self-pay

## 2018-03-23 DIAGNOSIS — Z961 Presence of intraocular lens: Secondary | ICD-10-CM | POA: Diagnosis not present

## 2018-03-23 DIAGNOSIS — K810 Acute cholecystitis: Secondary | ICD-10-CM | POA: Diagnosis not present

## 2018-03-23 DIAGNOSIS — K8020 Calculus of gallbladder without cholecystitis without obstruction: Secondary | ICD-10-CM | POA: Diagnosis not present

## 2018-03-23 DIAGNOSIS — Z8546 Personal history of malignant neoplasm of prostate: Secondary | ICD-10-CM | POA: Diagnosis not present

## 2018-03-23 DIAGNOSIS — R739 Hyperglycemia, unspecified: Secondary | ICD-10-CM | POA: Diagnosis not present

## 2018-03-23 DIAGNOSIS — I739 Peripheral vascular disease, unspecified: Secondary | ICD-10-CM | POA: Diagnosis present

## 2018-03-23 DIAGNOSIS — K219 Gastro-esophageal reflux disease without esophagitis: Secondary | ICD-10-CM | POA: Diagnosis present

## 2018-03-23 DIAGNOSIS — R112 Nausea with vomiting, unspecified: Secondary | ICD-10-CM | POA: Diagnosis not present

## 2018-03-23 DIAGNOSIS — Z9079 Acquired absence of other genital organ(s): Secondary | ICD-10-CM | POA: Diagnosis not present

## 2018-03-23 DIAGNOSIS — R1110 Vomiting, unspecified: Secondary | ICD-10-CM | POA: Diagnosis not present

## 2018-03-23 DIAGNOSIS — Z8249 Family history of ischemic heart disease and other diseases of the circulatory system: Secondary | ICD-10-CM

## 2018-03-23 DIAGNOSIS — R0602 Shortness of breath: Secondary | ICD-10-CM | POA: Diagnosis not present

## 2018-03-23 DIAGNOSIS — Z9841 Cataract extraction status, right eye: Secondary | ICD-10-CM | POA: Diagnosis not present

## 2018-03-23 DIAGNOSIS — L89629 Pressure ulcer of left heel, unspecified stage: Secondary | ICD-10-CM | POA: Diagnosis present

## 2018-03-23 DIAGNOSIS — K819 Cholecystitis, unspecified: Secondary | ICD-10-CM

## 2018-03-23 DIAGNOSIS — E785 Hyperlipidemia, unspecified: Secondary | ICD-10-CM | POA: Diagnosis present

## 2018-03-23 DIAGNOSIS — Z66 Do not resuscitate: Secondary | ICD-10-CM | POA: Diagnosis present

## 2018-03-23 DIAGNOSIS — Z79899 Other long term (current) drug therapy: Secondary | ICD-10-CM

## 2018-03-23 DIAGNOSIS — I480 Paroxysmal atrial fibrillation: Secondary | ICD-10-CM | POA: Diagnosis not present

## 2018-03-23 DIAGNOSIS — Z9842 Cataract extraction status, left eye: Secondary | ICD-10-CM

## 2018-03-23 DIAGNOSIS — I10 Essential (primary) hypertension: Secondary | ICD-10-CM | POA: Diagnosis not present

## 2018-03-23 DIAGNOSIS — N179 Acute kidney failure, unspecified: Secondary | ICD-10-CM | POA: Diagnosis present

## 2018-03-23 DIAGNOSIS — Z87891 Personal history of nicotine dependence: Secondary | ICD-10-CM

## 2018-03-23 DIAGNOSIS — Z96651 Presence of right artificial knee joint: Secondary | ICD-10-CM | POA: Diagnosis not present

## 2018-03-23 DIAGNOSIS — Z23 Encounter for immunization: Secondary | ICD-10-CM | POA: Diagnosis not present

## 2018-03-23 DIAGNOSIS — A419 Sepsis, unspecified organism: Principal | ICD-10-CM | POA: Diagnosis present

## 2018-03-23 DIAGNOSIS — Z7901 Long term (current) use of anticoagulants: Secondary | ICD-10-CM

## 2018-03-23 DIAGNOSIS — J9811 Atelectasis: Secondary | ICD-10-CM | POA: Diagnosis not present

## 2018-03-23 HISTORY — PX: IR PERC CHOLECYSTOSTOMY: IMG2326

## 2018-03-23 LAB — COMPREHENSIVE METABOLIC PANEL
ALBUMIN: 3.5 g/dL (ref 3.5–5.0)
ALT: 12 U/L (ref 0–44)
AST: 24 U/L (ref 15–41)
Alkaline Phosphatase: 50 U/L (ref 38–126)
Anion gap: 8 (ref 5–15)
BUN: 25 mg/dL — AB (ref 8–23)
CHLORIDE: 108 mmol/L (ref 98–111)
CO2: 24 mmol/L (ref 22–32)
Calcium: 8.8 mg/dL — ABNORMAL LOW (ref 8.9–10.3)
Creatinine, Ser: 1.14 mg/dL (ref 0.61–1.24)
GFR calc Af Amer: >60 mL/min (ref >60)
GFR calc non Af Amer: 55 mL/min — ABNORMAL LOW (ref >60)
GLUCOSE: 120 mg/dL — AB (ref 70–99)
Potassium: 3.9 mmol/L (ref 3.5–5.1)
Sodium: 140 mmol/L (ref 135–145)
Total Bilirubin: 1.1 mg/dL (ref 0.3–1.2)
Total Protein: 6.5 g/dL (ref 6.5–8.1)

## 2018-03-23 LAB — URINALYSIS, COMPLETE (UACMP) WITH MICROSCOPIC
BILIRUBIN URINE: NEGATIVE
Bacteria, UA: NONE SEEN
GLUCOSE, UA: NEGATIVE mg/dL
Hgb urine dipstick: NEGATIVE
KETONES UR: NEGATIVE mg/dL
LEUKOCYTES UA: NEGATIVE
NITRITE: NEGATIVE
PH: 6 (ref 5.0–8.0)
Protein, ur: 100 mg/dL — AB
Specific Gravity, Urine: 1.019 (ref 1.005–1.030)

## 2018-03-23 LAB — ACETAMINOPHEN LEVEL

## 2018-03-23 LAB — CBC WITH DIFFERENTIAL/PLATELET
BASOS ABS: 0 10*3/uL (ref 0–0.1)
BASOS PCT: 0 %
EOS ABS: 0 10*3/uL (ref 0–0.7)
EOS PCT: 0 %
HCT: 36.3 % — ABNORMAL LOW (ref 40.0–52.0)
Hemoglobin: 12.7 g/dL — ABNORMAL LOW (ref 13.0–18.0)
Lymphocytes Relative: 4 %
Lymphs Abs: 0.8 10*3/uL — ABNORMAL LOW (ref 1.0–3.6)
MCH: 34 pg (ref 26.0–34.0)
MCHC: 34.9 g/dL (ref 32.0–36.0)
MCV: 97.4 fL (ref 80.0–100.0)
MONO ABS: 0.8 10*3/uL (ref 0.2–1.0)
Monocytes Relative: 4 %
NEUTROS PCT: 92 %
Neutro Abs: 19.1 10*3/uL — ABNORMAL HIGH (ref 1.4–6.5)
PLATELETS: 185 10*3/uL (ref 150–440)
RBC: 3.72 MIL/uL — ABNORMAL LOW (ref 4.40–5.90)
RDW: 16 % — AB (ref 11.5–14.5)
WBC: 20.7 10*3/uL — ABNORMAL HIGH (ref 3.8–10.6)

## 2018-03-23 LAB — ETHANOL: Alcohol, Ethyl (B): <10 mg/dL (ref <10)

## 2018-03-23 LAB — LACTIC ACID, PLASMA
LACTIC ACID, VENOUS: 1.3 mmol/L (ref 0.5–1.9)
Lactic Acid, Venous: 2.5 mmol/L (ref 0.5–1.9)
Lactic Acid, Venous: 1.9 mmol/L (ref 0.5–1.9)

## 2018-03-23 LAB — PROTIME-INR
INR: 1.51
PROTHROMBIN TIME: 18.1 s — AB (ref 11.4–15.2)

## 2018-03-23 LAB — SALICYLATE LEVEL: Salicylate Lvl: <7 mg/dL (ref 2.8–30.0)

## 2018-03-23 LAB — PROCALCITONIN: Procalcitonin: 0.97 ng/mL

## 2018-03-23 LAB — LIPASE, BLOOD: LIPASE: 19 U/L (ref 11–51)

## 2018-03-23 MED ORDER — VITAMIN D 1000 UNITS PO TABS
1000.0000 [IU] | ORAL_TABLET | Freq: Every day | ORAL | Status: DC
  Administered 2018-03-24 – 2018-03-25 (×2): 1000 [IU] via ORAL
  Filled 2018-03-23 (×4): qty 1

## 2018-03-23 MED ORDER — INFLUENZA VAC SPLIT HIGH-DOSE 0.5 ML IM SUSY
0.5000 mL | PREFILLED_SYRINGE | INTRAMUSCULAR | Status: AC
  Administered 2018-03-25: 0.5 mL via INTRAMUSCULAR
  Filled 2018-03-23 (×2): qty 0.5

## 2018-03-23 MED ORDER — PRAVASTATIN SODIUM 40 MG PO TABS
40.0000 mg | ORAL_TABLET | Freq: Every day | ORAL | Status: DC
  Administered 2018-03-23 – 2018-03-24 (×2): 40 mg via ORAL
  Filled 2018-03-23: qty 2
  Filled 2018-03-23 (×3): qty 1

## 2018-03-23 MED ORDER — MIDAZOLAM HCL 2 MG/2ML IJ SOLN
INTRAMUSCULAR | Status: AC
  Filled 2018-03-23: qty 2

## 2018-03-23 MED ORDER — DEXAMETHASONE SODIUM PHOSPHATE 10 MG/ML IJ SOLN
10.0000 mg | Freq: Once | INTRAMUSCULAR | Status: AC
  Administered 2018-03-23: 10 mg via INTRAVENOUS
  Filled 2018-03-23: qty 1

## 2018-03-23 MED ORDER — TERAZOSIN HCL 2 MG PO CAPS
2.0000 mg | ORAL_CAPSULE | Freq: Every day | ORAL | Status: DC
  Administered 2018-03-23 – 2018-03-24 (×2): 2 mg via ORAL
  Filled 2018-03-23 (×3): qty 1

## 2018-03-23 MED ORDER — SODIUM CHLORIDE 0.9 % IV BOLUS
1000.0000 mL | Freq: Once | INTRAVENOUS | Status: AC
  Administered 2018-03-23: 1000 mL via INTRAVENOUS

## 2018-03-23 MED ORDER — PANTOPRAZOLE SODIUM 40 MG PO TBEC
40.0000 mg | DELAYED_RELEASE_TABLET | Freq: Every day | ORAL | Status: DC
  Administered 2018-03-23 – 2018-03-24 (×2): 40 mg via ORAL
  Filled 2018-03-23 (×2): qty 1

## 2018-03-23 MED ORDER — LIDOCAINE HCL (PF) 1 % IJ SOLN
INTRAMUSCULAR | Status: AC | PRN
  Administered 2018-03-23: 10 mL

## 2018-03-23 MED ORDER — SODIUM CHLORIDE 0.9 % IV BOLUS: 1000.0000 mL | Freq: Once | INTRAVENOUS | Status: DC

## 2018-03-23 MED ORDER — METRONIDAZOLE IN NACL 5-0.79 MG/ML-% IV SOLN
500.0000 mg | Freq: Three times a day (TID) | INTRAVENOUS | Status: DC
  Administered 2018-03-23 – 2018-03-25 (×6): 500 mg via INTRAVENOUS
  Filled 2018-03-23 (×8): qty 100

## 2018-03-23 MED ORDER — FENTANYL CITRATE (PF) 100 MCG/2ML IJ SOLN
INTRAMUSCULAR | Status: AC
  Filled 2018-03-23: qty 2

## 2018-03-23 MED ORDER — ONDANSETRON HCL 4 MG PO TABS: 4.0000 mg | ORAL_TABLET | Freq: Four times a day (QID) | ORAL | Status: DC | PRN

## 2018-03-23 MED ORDER — WARFARIN SODIUM 3 MG PO TABS
3.5000 mg | ORAL_TABLET | Freq: Every day | ORAL | Status: DC
  Administered 2018-03-23: 3.5 mg via ORAL
  Filled 2018-03-23 (×2): qty 1

## 2018-03-23 MED ORDER — SODIUM CHLORIDE 0.9 % IV SOLN
2.0000 g | Freq: Once | INTRAVENOUS | Status: DC
  Filled 2018-03-23: qty 2000

## 2018-03-23 MED ORDER — DEXTROSE 5 % IV SOLN
10.0000 mg/kg | Freq: Once | INTRAVENOUS | Status: DC
  Filled 2018-03-23: qty 16

## 2018-03-23 MED ORDER — IOPAMIDOL (ISOVUE-300) INJECTION 61%
100.0000 mL | Freq: Once | INTRAVENOUS | Status: AC | PRN
  Administered 2018-03-23: 100 mL via INTRAVENOUS

## 2018-03-23 MED ORDER — ACETAMINOPHEN 650 MG RE SUPP: 650.0000 mg | Freq: Four times a day (QID) | RECTAL | Status: DC | PRN

## 2018-03-23 MED ORDER — SODIUM CHLORIDE 0.9 % IV SOLN
2.0000 g | Freq: Two times a day (BID) | INTRAVENOUS | Status: DC
  Administered 2018-03-24: 2 g via INTRAVENOUS
  Filled 2018-03-23 (×3): qty 2

## 2018-03-23 MED ORDER — POLYETHYLENE GLYCOL 3350 17 G PO PACK
17.0000 g | PACK | Freq: Every day | ORAL | Status: DC | PRN
  Filled 2018-03-23: qty 1

## 2018-03-23 MED ORDER — ACETAMINOPHEN 325 MG PO TABS: 650.0000 mg | ORAL_TABLET | Freq: Four times a day (QID) | ORAL | Status: DC | PRN

## 2018-03-23 MED ORDER — SODIUM CHLORIDE 0.9 % IV SOLN
INTRAVENOUS | Status: AC
  Administered 2018-03-23 – 2018-03-24 (×2): via INTRAVENOUS

## 2018-03-23 MED ORDER — SODIUM CHLORIDE 0.9 % IV SOLN: 2.0000 g | Freq: Two times a day (BID) | INTRAVENOUS | Status: DC

## 2018-03-23 MED ORDER — FENTANYL CITRATE (PF) 100 MCG/2ML IJ SOLN
INTRAMUSCULAR | Status: AC | PRN
  Administered 2018-03-23 (×2): 25 ug via INTRAVENOUS

## 2018-03-23 MED ORDER — ADULT MULTIVITAMIN W/MINERALS CH
1.0000 | ORAL_TABLET | Freq: Every day | ORAL | Status: DC
  Administered 2018-03-24 – 2018-03-25 (×2): 1 via ORAL
  Filled 2018-03-23 (×2): qty 1

## 2018-03-23 MED ORDER — VANCOMYCIN HCL IN DEXTROSE 1-5 GM/200ML-% IV SOLN
1000.0000 mg | Freq: Once | INTRAVENOUS | Status: AC
  Administered 2018-03-23: 1000 mg via INTRAVENOUS
  Filled 2018-03-23: qty 200

## 2018-03-23 MED ORDER — LIDOCAINE-EPINEPHRINE (PF) 1 %-1:200000 IJ SOLN
INTRAMUSCULAR | Status: AC
  Filled 2018-03-23: qty 30

## 2018-03-23 MED ORDER — WARFARIN - PHARMACIST DOSING INPATIENT: Freq: Every day | Status: DC

## 2018-03-23 MED ORDER — SODIUM CHLORIDE 0.9% FLUSH
5.0000 mL | Freq: Three times a day (TID) | INTRAVENOUS | Status: DC
  Administered 2018-03-23 – 2018-03-25 (×5): 5 mL

## 2018-03-23 MED ORDER — ONDANSETRON HCL 4 MG/2ML IJ SOLN: 4.0000 mg | Freq: Four times a day (QID) | INTRAMUSCULAR | Status: DC | PRN

## 2018-03-23 MED ORDER — VANCOMYCIN HCL IN DEXTROSE 750-5 MG/150ML-% IV SOLN
750.0000 mg | Freq: Two times a day (BID) | INTRAVENOUS | Status: DC
  Administered 2018-03-23 – 2018-03-24 (×2): 750 mg via INTRAVENOUS
  Filled 2018-03-23 (×3): qty 150

## 2018-03-23 MED ORDER — MIDAZOLAM HCL 2 MG/2ML IJ SOLN
INTRAMUSCULAR | Status: AC | PRN
  Administered 2018-03-23 (×2): 0.5 mg via INTRAVENOUS

## 2018-03-23 MED ORDER — SODIUM CHLORIDE 0.9 % IV SOLN
2.0000 g | Freq: Once | INTRAVENOUS | Status: AC
  Administered 2018-03-23: 2 g via INTRAVENOUS
  Filled 2018-03-23: qty 2

## 2018-03-23 MED ORDER — VITAMIN B-12 1000 MCG PO TABS
1000.0000 ug | ORAL_TABLET | Freq: Every day | ORAL | Status: DC
  Administered 2018-03-24 – 2018-03-25 (×2): 1000 ug via ORAL
  Filled 2018-03-23 (×2): qty 1

## 2018-03-23 MED ORDER — IOPAMIDOL (ISOVUE-300) INJECTION 61%
30.0000 mL | Freq: Once | INTRAVENOUS | Status: AC | PRN
  Administered 2018-03-23: 10 mL

## 2018-03-23 MED ORDER — MORPHINE SULFATE (PF) 2 MG/ML IV SOLN: 2.0000 mg | INTRAVENOUS | Status: DC | PRN

## 2018-03-23 MED ORDER — SODIUM CHLORIDE 0.9 % IV SOLN
2.0000 g | Freq: Once | INTRAVENOUS | Status: DC
  Filled 2018-03-23: qty 20

## 2018-03-23 NOTE — ED Notes (Signed)
Report received. Pt will need his green top blood tube redrawn but otherwise all bloods previously drawn. Lactic acid elevated and dr is aware.

## 2018-03-23 NOTE — ED Triage Notes (Signed)
Pt is from home via [facility]: Vomiting fever Temp  vss CBG 147 pt is confused with no hx Dark urine color wife reports

## 2018-03-23 NOTE — Progress Notes (Signed)
ANTICOAGULATION CONSULT NOTE - Initial Consult  Pharmacy Consult for Warfarin Indication: atrial fibrillation  No Known Allergies  Patient Measurements: Height: 6' (182.9 cm) Weight: 170 lb (77.1 kg) IBW/kg (Calculated) : 77.6 Heparin Dosing Weight:   Vital Signs: Temp: 97.6 F (36.4 C) (09/15 1904) Temp Source: Oral (09/15 1904) BP: 146/51 (09/15 1904) Pulse Rate: 76 (09/15 1904)  Labs: Recent Labs    03/23/18 1025 03/23/18 1215  HGB 12.7*  --   HCT 36.3*  --   PLT 185  --   LABPROT 18.1*  --   INR 1.51  --   CREATININE  --  1.14    Estimated Creatinine Clearance: 47 mL/min (by C-G formula based on SCr of 1.14 mg/dL).   Medical History: Past Medical History:  Diagnosis Date  . Atrial fibrillation ([place])   . Cancer ([place])    PROSTATE  . Dysrhythmia    AFIB  . GERD (gastroesophageal reflux disease)   . Hypertension     Assessment: Patient is a 84yo male admitted for acute cholecystitis, will have a cholecystomy tube placed by IR. Pharmacy consulted for warfarin dosing post IR. Patient's home dose is 3.5 mg daily, has not taken in 2-3 days.  Goal of Therapy:  INR 2-3   Plan:  Will resume home dose of Warfarin 3.5mg  daily post IR tube placement. Daily INR checks to guide dosing.  Paulina Fusi, PharmD, BCPS 03/23/2018 8:12 PM

## 2018-03-23 NOTE — ED Notes (Signed)
Pt in ct 

## 2018-03-23 NOTE — Procedures (Signed)
Pre procedural Dx: Acute cholecysitis Post procedural Dx: Same  Technically successful [place] and Fluoro guided placement of a 10 Fr drainage catheter placement into the gallbladder lumen. Small sample of aspirated bile sent to lab for analysis.  Chole tube connected to gravity bag.  EBL: None  Complications: None immediate  Ronny Bacon, MD Pager #: [phone]

## 2018-03-23 NOTE — ED Provider Notes (Addendum)
[facility] Emergency Department Provider Note  ____________________________________________  Time seen: Approximately 2:15 PM  I have reviewed the triage vital signs and the nursing notes.   HISTORY  Chief Complaint Weakness  Level 5 Caveat: Portions of the History and Physical including HPI and review of systems are unable to be completely obtained due to patient being a poor historian    HPI [name] is a 84 y.o. male with a history of atrial fibrillation and hypertension who comes to the ED today complaining of generalized weakness.  Spouse thought maybe he was confused earlier as well.  EMS report normal vital signs including afebrile.   Patient denies any pain or shortness of breath.  No nausea or vomiting.  Patient denies fever.     Past Medical History:  Diagnosis Date  . Atrial fibrillation ([place])   . Cancer ([place])    PROSTATE  . Dysrhythmia    AFIB  . GERD (gastroesophageal reflux disease)   . Hypertension      Patient Active Problem List   Diagnosis Date Noted  . Fracture closed, pubis ([place]) 07/15/2017  . Bilateral carotid artery stenosis 01/01/2017  . Peripheral artery disease ([place]) 01/01/2017  . Essential hypertension 01/01/2017  . Hyperlipidemia 01/01/2017     Past Surgical History:  Procedure Laterality Date  . CAROTID ENDARTERECTOMY Left   . CATARACT EXTRACTION W/PHACO Right 10/13/2015   Procedure: CATARACT EXTRACTION PHACO AND INTRAOCULAR LENS PLACEMENT (IOC);  Surgeon: Leandrew Koyanagi, MD;  Location: [facility] ORS;  Service: Ophthalmology;  Laterality: Right;  Lot # [id number] H US:01:23.3 AP:17.2 CDE:14.31  . CATARACT EXTRACTION W/PHACO Left 12/12/2015   Procedure: CATARACT EXTRACTION PHACO AND INTRAOCULAR LENS PLACEMENT (IOC);  Surgeon: Leandrew Koyanagi, MD;  Location: [facility] ORS;  Service: Ophthalmology;  Laterality: Left;  [place] 1.10 AP% 15.9 CDE 11.22 FLUID PACK LOT # [id number]  . JOINT REPLACEMENT Right    TKR      Prior to Admission medications   Medication Sig Start Date End Date Taking? Authorizing Provider  amLODipine (NORVASC) 5 MG tablet Take 5 mg by mouth daily.  07/23/16  Yes [provider]  Cholecalciferol (VITAMIN D-3) 1000 units CAPS Take 1,000 Units by mouth daily.    Yes [provider]  lovastatin (MEVACOR) 40 MG tablet Take 80 mg by mouth at bedtime.    Yes [provider]  Multiple Vitamin (MULTIVITAMIN) capsule Take 1 capsule by mouth daily.   Yes [provider]  pantoprazole (PROTONIX) 40 MG tablet TAKE 1 TABLET BY MOUTH EVERY DAY 10/24/15  Yes [provider]  terazosin (HYTRIN) 2 MG capsule Take 2 mg by mouth at bedtime. 01/10/17  Yes [provider]  vitamin B-12 (CYANOCOBALAMIN) 1000 MCG tablet Take 1,000 mcg by mouth daily.   Yes [provider]  warfarin (COUMADIN) 1 MG tablet Take 1 mg by mouth daily. 02/06/18  Yes [provider]  warfarin (COUMADIN) 2.5 MG tablet Take 2.5 mg by mouth daily.    Yes [provider]  bisacodyl (DULCOLAX) 5 MG EC tablet Take 1 tablet (5 mg total) by mouth daily as needed for moderate constipation. Patient not taking: Reported on 08/30/2017 07/17/17   [name], MD  HYDROcodone-acetaminophen (NORCO/VICODIN) 5-325 MG tablet Take 1-2 tablets by mouth every 4 (four) hours as needed for moderate pain. Patient not taking: Reported on 08/30/2017 07/17/17   [name], MD  methocarbamol (ROBAXIN) 500 MG tablet Take 1 tablet (500 mg total) by mouth every 6 (six)  hours as needed for muscle spasms. Patient not taking: Reported on 08/30/2017 07/17/17   [name], MD     Allergies Patient has no known allergies.   Family History  Problem Relation Age of Onset  . Heart attack Father     Social History Social History   Tobacco Use  . Smoking status: Former Research scientist (life sciences)  . Smokeless tobacco: Never Used  Substance Use Topics  . Alcohol use: Yes     Comment: 1.5 oz daily - 6/3  last use  . Drug use: No    Review of Systems  Constitutional:   No fever or chills.  ENT:   No sore throat. No rhinorrhea. Cardiovascular:   No chest pain or syncope. Respiratory:   No dyspnea or cough. Gastrointestinal:   Negative for abdominal pain, vomiting and diarrhea.  Musculoskeletal:   Negative for focal pain or swelling.  Chronic soft tissue ulcerations on the lower extremities being managed by wound care. All other systems reviewed and are negative except as documented above in ROS and HPI.  ____________________________________________   PHYSICAL EXAM:  VITAL SIGNS: ED Triage Vitals  Enc Vitals Group     BP 03/23/18 1017 132/61     Pulse Rate 03/23/18 1013 82     Resp 03/23/18 1013 (!) 34     Temp 03/23/18 1010 98.1 F (36.7 C)     Temp Source 03/23/18 1010 Oral     SpO2 03/23/18 1030 99 %     Weight 03/23/18 1010 176 lb 5.9 oz (80 kg)     Height 03/23/18 1010 6' (1.829 m)     Head Circumference --      Peak Flow --      Pain Score 03/23/18 1010 0     Pain Loc --      Pain Edu? --      Excl. in [place]? --     Vital signs reviewed, nursing assessments reviewed.   Constitutional:   Alert and oriented to person and place.  Ill-appearing. Eyes:   Conjunctivae are normal. EOMI. PERRL. ENT      Head:   Normocephalic and atraumatic.      Nose:   No congestion/rhinnorhea.       Mouth/Throat:   MMM, no pharyngeal erythema. No peritonsillar mass.       Neck:   No meningismus. Full ROM. Hematological/Lymphatic/Immunilogical:   No cervical lymphadenopathy. Cardiovascular:   RRR. Symmetric bilateral radial and DP pulses.  No murmurs. Cap refill less than 2 seconds. Respiratory:   Normal work of breathing.  Tachypnea.  Normal breath sounds bilaterally without crackles.. Gastrointestinal:   Soft with mild right upper quadrant tenderness. Non distended. There is no CVA tenderness.  No rebound, rigidity, or guarding.  Musculoskeletal:   Normal  range of motion in all extremities. No joint effusions.  No lower extremity tenderness.  No edema. Neurologic:   Normal speech and language.  Motor grossly intact. No acute focal neurologic deficits are appreciated.  Skin:    Skin is warm, dry and intact. No rash noted.  No petechiae, purpura, or bullae.  ____________________________________________    LABS (pertinent positives/negatives) (all labs ordered are listed, but only abnormal results are displayed) Labs Reviewed  LACTIC ACID, PLASMA - Abnormal; Notable for the following components:      Result Value   Lactic Acid, Venous 2.5 (*)    All other components within normal limits  URINALYSIS, COMPLETE (UACMP) WITH MICROSCOPIC - Abnormal; Notable for the following components:  Color, Urine YELLOW (*)    APPearance CLEAR (*)    Protein, ur 100 (*)    All other components within normal limits  CBC WITH DIFFERENTIAL/PLATELET - Abnormal; Notable for the following components:   WBC 20.7 (*)    RBC 3.72 (*)    Hemoglobin 12.7 (*)    HCT 36.3 (*)    RDW 16.0 (*)    Neutro Abs 19.1 (*)    Lymphs Abs 0.8 (*)    All other components within normal limits  PROTIME-INR - Abnormal; Notable for the following components:   Prothrombin Time 18.1 (*)    All other components within normal limits  COMPREHENSIVE METABOLIC PANEL - Abnormal; Notable for the following components:   Glucose, Bld 120 (*)    BUN 25 (*)    Calcium 8.8 (*)    GFR calc non Af Amer 55 (*)    All other components within normal limits  URINE CULTURE  LIPASE, BLOOD  LACTIC ACID, PLASMA  ACETAMINOPHEN LEVEL  ETHANOL  SALICYLATE LEVEL   ____________________________________________   EKG  Interpreted by me Atrial fibrillation, rate of 79.  Normal axis intervals QRS ST segments and T waves  ____________________________________________    RADIOLOGY  Ct Chest W Contrast  Result Date: 03/23/2018 CLINICAL DATA:  Nausea, vomiting, fever and cough. Shortness of  breath. EXAM: CT CHEST, ABDOMEN, AND PELVIS WITH CONTRAST TECHNIQUE: Multidetector CT imaging of the chest, abdomen and pelvis was performed following the standard protocol during bolus administration of intravenous contrast. CONTRAST:  119mL ISOVUE-300 IOPAMIDOL (ISOVUE-300) INJECTION 61% COMPARISON:  Chest CT angiogram dated 07/15/2017. CT abdomen dated 04/07/2012. FINDINGS: CT CHEST FINDINGS Cardiovascular: Heart size is upper normal. No pericardial effusion. No thoracic aortic aneurysm or evidence of aortic dissection. Aortic atherosclerosis. Diffuse coronary artery calcifications. No central obstructing pulmonary embolism seen. Mediastinum/Nodes: No mass or enlarged lymph nodes within the mediastinum or perihilar regions. Mildly prominent lymph nodes again noted within the RIGHT paratracheal space and subcarinal space. Esophagus appears normal. Trachea and central bronchi are unremarkable. Lungs/Pleura: Mild bibasilar atelectasis, most likely chronic. No new lung findings. No evidence of pneumonia. No pleural effusion or pneumothorax. Musculoskeletal: No acute or suspicious osseous finding. Degenerative changes throughout the slightly scoliotic thoracic spine, mild to moderate in degree. CT ABDOMEN PELVIS FINDINGS Hepatobiliary: Gallbladder is distended. Gallbladder walls are thickened and there is pericholecystic inflammation. Multiple stones in the gallbladder neck region, including 1 small stone that is likely in the cystic duct. No focal liver abnormality.  No bile duct dilatation. Pancreas: Unremarkable. No pancreatic ductal dilatation or surrounding inflammatory changes. Spleen: Normal in size without focal abnormality. Adrenals/Urinary Tract: Adrenal glands appear normal. Kidneys are unremarkable without suspicious mass, stone or hydronephrosis. No perinephric inflammation. No ureteral or bladder calculi identified. Bladder appears normal. Stomach/Bowel: No dilated large or small bowel loops. No evidence  of bowel wall inflammation. Incidental note is made of cecal displacement to the RIGHT upper quadrant. Extensive diverticulosis of the sigmoid and descending colon but no focal inflammatory change to suggest acute diverticulitis. Stomach appears normal, decompressed. Vascular/Lymphatic: Aortic atherosclerosis. No enlarged abdominal or pelvic lymph nodes. Reproductive: Prostate is unremarkable. Other: No abscess collection.  No free intraperitoneal air. Musculoskeletal: No acute or suspicious osseous finding. Degenerative spondylitic changes throughout the lumbar spine, moderate to severe in degree. Old healed fractures of the bilateral pubic rami. IMPRESSION: 1. Findings are consistent with ACUTE CHOLECYSTITIS. Multiple gallstones in the gallbladder neck region, including 1 small stone that is likely obstructing within  the cystic duct. 2. No other acute findings within the chest, abdomen or pelvis. 3. Colonic diverticulosis without evidence of acute diverticulitis. 4.  Aortic Atherosclerosis (ICD10-I70.0). 5. Additional chronic/incidental findings detailed above. Electronically Signed   By: Franki Cabot M.D.   On: 03/23/2018 13:29   Ct Abdomen Pelvis W Contrast  Result Date: 03/23/2018 CLINICAL DATA:  Nausea, vomiting, fever and cough. Shortness of breath. EXAM: CT CHEST, ABDOMEN, AND PELVIS WITH CONTRAST TECHNIQUE: Multidetector CT imaging of the chest, abdomen and pelvis was performed following the standard protocol during bolus administration of intravenous contrast. CONTRAST:  129mL ISOVUE-300 IOPAMIDOL (ISOVUE-300) INJECTION 61% COMPARISON:  Chest CT angiogram dated 07/15/2017. CT abdomen dated 04/07/2012. FINDINGS: CT CHEST FINDINGS Cardiovascular: Heart size is upper normal. No pericardial effusion. No thoracic aortic aneurysm or evidence of aortic dissection. Aortic atherosclerosis. Diffuse coronary artery calcifications. No central obstructing pulmonary embolism seen. Mediastinum/Nodes: No mass or  enlarged lymph nodes within the mediastinum or perihilar regions. Mildly prominent lymph nodes again noted within the RIGHT paratracheal space and subcarinal space. Esophagus appears normal. Trachea and central bronchi are unremarkable. Lungs/Pleura: Mild bibasilar atelectasis, most likely chronic. No new lung findings. No evidence of pneumonia. No pleural effusion or pneumothorax. Musculoskeletal: No acute or suspicious osseous finding. Degenerative changes throughout the slightly scoliotic thoracic spine, mild to moderate in degree. CT ABDOMEN PELVIS FINDINGS Hepatobiliary: Gallbladder is distended. Gallbladder walls are thickened and there is pericholecystic inflammation. Multiple stones in the gallbladder neck region, including 1 small stone that is likely in the cystic duct. No focal liver abnormality.  No bile duct dilatation. Pancreas: Unremarkable. No pancreatic ductal dilatation or surrounding inflammatory changes. Spleen: Normal in size without focal abnormality. Adrenals/Urinary Tract: Adrenal glands appear normal. Kidneys are unremarkable without suspicious mass, stone or hydronephrosis. No perinephric inflammation. No ureteral or bladder calculi identified. Bladder appears normal. Stomach/Bowel: No dilated large or small bowel loops. No evidence of bowel wall inflammation. Incidental note is made of cecal displacement to the RIGHT upper quadrant. Extensive diverticulosis of the sigmoid and descending colon but no focal inflammatory change to suggest acute diverticulitis. Stomach appears normal, decompressed. Vascular/Lymphatic: Aortic atherosclerosis. No enlarged abdominal or pelvic lymph nodes. Reproductive: Prostate is unremarkable. Other: No abscess collection.  No free intraperitoneal air. Musculoskeletal: No acute or suspicious osseous finding. Degenerative spondylitic changes throughout the lumbar spine, moderate to severe in degree. Old healed fractures of the bilateral pubic rami. IMPRESSION: 1.  Findings are consistent with ACUTE CHOLECYSTITIS. Multiple gallstones in the gallbladder neck region, including 1 small stone that is likely obstructing within the cystic duct. 2. No other acute findings within the chest, abdomen or pelvis. 3. Colonic diverticulosis without evidence of acute diverticulitis. 4.  Aortic Atherosclerosis (ICD10-I70.0). 5. Additional chronic/incidental findings detailed above. Electronically Signed   By: Franki Cabot M.D.   On: 03/23/2018 13:29   Dg Chest Portable 1 View  Result Date: 03/23/2018 CLINICAL DATA:  Nausea and vomiting for several days EXAM: PORTABLE CHEST 1 VIEW COMPARISON:  02/20/2018 FINDINGS: Cardiac shadow is at the upper limits of normal in size. Aortic calcifications are again seen. The lungs are hypoinflated. Mild increased retrocardiac density is noted likely related atelectasis. No sizable effusion is seen. No bony abnormality is noted. IMPRESSION: Mild left retrocardiac atelectasis. Electronically Signed   By: Inez Catalina M.D.   On: 03/23/2018 10:43    ____________________________________________   PROCEDURES .Critical Care Performed by: Carrie Mew, MD Authorized by: Carrie Mew, MD   Critical care provider statement:  Critical care time (minutes):  35   Critical care time was exclusive of:  Separately billable procedures and treating other patients   Critical care was necessary to treat or prevent imminent or life-threatening deterioration of the following conditions:  Sepsis   Critical care was time spent personally by me on the following activities:  Development of treatment plan with patient or surrogate, discussions with consultants, evaluation of patient's response to treatment, examination of patient, obtaining history from patient or surrogate, ordering and performing treatments and interventions, ordering and review of laboratory studies, ordering and review of radiographic studies, pulse oximetry, re-evaluation of  patient's condition and review of old charts    ____________________________________________  DIFFERENTIAL DIAGNOSIS   Urinary tract infection, pneumonia, appendicitis, bowel obstruction, diverticulitis, soft tissue infection  CLINICAL IMPRESSION / ASSESSMENT AND PLAN / ED COURSE  Pertinent labs & imaging results that were available during my care of the patient were reviewed by me and considered in my medical decision making (see chart for details).      Clinical Course as of Mar 23 1414  Sun Mar 23, 2018  1013 Tachcypnea, mild ruq tenderness on exam. Will check labs, plan CT a/p and CT head given AMS   [PS]  1203 No sig. Coagululopathy despite warfarin use. Will proceed to CT a/p and chest.. Cxr and UA and otherwise elevated lactate and high WBC concerning for occult infection. Code sepsis intiiated. Vancomycin, cefepime, flagyl  INR: 1.51 [PS]  4158 CT is consistent with acute cholecystitis.  Surgery paged.  Will need to admit.   [PS]  3094 Discussed with surgery Dr. Burt Knack.  He recommends admission to the hospitalist and cholecystostomy tube placement for source control.   [PS]    Clinical Course User Index [PS] Carrie Mew, MD    ----------------------------------------- 2:19 PM on 03/23/2018 -----------------------------------------  Discussed with hospitalist for further management  ----------------------------------------- 2:25 PM on 03/23/2018 -----------------------------------------  Discussed with interventional radiology who plans to do the procedure this evening.Marland Kitchen  ----------------------------------------- 2:36 PM on 03/23/2018 -----------------------------------------  Due to the evolving clinical scenario and lack of clear diagnosis, patient was given empiric antibiotics before nursing had a chance to draw blood cultures.  Due to lack of hypotension and lactate less than 4 I have canceled the code  sepsis.   ____________________________________________   FINAL CLINICAL IMPRESSION(S) / ED DIAGNOSES    Final diagnoses:  Cholecystitis  Sepsis, due to unspecified organism [facility])     ED Discharge Orders    None      Portions of this note were generated with dragon dictation software. Dictation errors may occur despite best attempts at proofreading.    Carrie Mew, MD 03/23/18 [place]    Carrie Mew, MD 03/23/18 [id number]

## 2018-03-23 NOTE — H&P (Signed)
[place] [facility] [place] NAME: Jordi Lacko    MR#:  [id number]  DATE OF BIRTH:  [birth year]  DATE OF ADMISSION:  03/23/2018  PRIMARY CARE PHYSICIAN: Adin Hector, MD   REQUESTING/REFERRING PHYSICIAN: Carrie Mew, MD  CHIEF COMPLAINT:   Chief Complaint  Patient presents with  . Weakness    HISTORY OF PRESENT ILLNESS:  Arnet Hofferber  is a 84 y.o. male with a known history of paroxysmal atrial fibrillation, prostate cancer, and HTN who presented to the ED with fevers and chills for the last 2 days. He had a temperature to 101F at home. He also had a couple episodes of vomiting two days ago. No nausea or constipation. No abdominal pain. Per his wife, he has seemed a little "out of it". At baseline, he walks with a walker.  In the ED, code sepsis was called due to a temperature of 101.40F, RR 25-35, and WBC 20.7. Lactic acid was 2.5. CT abdomen was consistent with acute cholecystitis. He was given a 1L NS bolus and started on vancomycin, cefepime, and flagyl. Surgery was consulted and recommended cholecystostomy tube placed by IR. Hospitalist team was called for admission.  PAST MEDICAL HISTORY:   Past Medical History:  Diagnosis Date  . Atrial fibrillation ([place])   . Cancer ([place])    PROSTATE  . Dysrhythmia    AFIB  . GERD (gastroesophageal reflux disease)   . Hypertension     PAST SURGICAL HISTORY:   Past Surgical History:  Procedure Laterality Date  . CAROTID ENDARTERECTOMY Left   . CATARACT EXTRACTION W/PHACO Right 10/13/2015   Procedure: CATARACT EXTRACTION PHACO AND INTRAOCULAR LENS PLACEMENT (IOC);  Surgeon: Leandrew Koyanagi, MD;  Location: [facility] ORS;  Service: Ophthalmology;  Laterality: Right;  Lot # [id number] H US:01:23.3 AP:17.2 CDE:14.31  . CATARACT EXTRACTION W/PHACO Left 12/12/2015   Procedure: CATARACT EXTRACTION PHACO AND INTRAOCULAR LENS PLACEMENT (IOC);  Surgeon: Leandrew Koyanagi, MD;  Location: [facility] ORS;   Service: Ophthalmology;  Laterality: Left;  [place] 1.10 AP% 15.9 CDE 11.22 FLUID PACK LOT # [id number]  . JOINT REPLACEMENT Right    TKR    SOCIAL HISTORY:   Social History   Tobacco Use  . Smoking status: Former Research scientist (life sciences)  . Smokeless tobacco: Never Used  Substance Use Topics  . Alcohol use: Yes    Comment: 1.5 oz daily - 6/3  last use    FAMILY HISTORY:   Family History  Problem Relation Age of Onset  . Heart attack Father     DRUG ALLERGIES:  No Known Allergies  REVIEW OF SYSTEMS:   Review of Systems  Constitutional: Positive for chills and fever.  HENT: Negative for congestion and sore throat.   Eyes: Negative for blurred vision and double vision.  Respiratory: Negative for cough and shortness of breath.   Cardiovascular: Negative for chest pain, palpitations and leg swelling.  Gastrointestinal: Positive for vomiting. Negative for abdominal pain, constipation, diarrhea and nausea.  Genitourinary: Negative for dysuria and frequency.  Musculoskeletal: Negative for back pain and neck pain.  Neurological: Negative for dizziness and headaches.  Psychiatric/Behavioral: Negative for depression. The patient is not nervous/anxious.    MEDICATIONS AT HOME:   Prior to Admission medications   Medication Sig Start Date End Date Taking? Authorizing Provider  amLODipine (NORVASC) 5 MG tablet Take 5 mg by mouth daily.  07/23/16  Yes [provider]  Cholecalciferol (VITAMIN D-3) 1000 units CAPS Take 1,000 Units by mouth daily.  Yes [provider]  lovastatin (MEVACOR) 40 MG tablet Take 80 mg by mouth at bedtime.    Yes [provider]  Multiple Vitamin (MULTIVITAMIN) capsule Take 1 capsule by mouth daily.   Yes [provider]  pantoprazole (PROTONIX) 40 MG tablet TAKE 1 TABLET BY MOUTH EVERY DAY 10/24/15  Yes [provider]  terazosin (HYTRIN) 2 MG capsule Take 2 mg by mouth at bedtime. 01/10/17  Yes [provider]  vitamin B-12  (CYANOCOBALAMIN) 1000 MCG tablet Take 1,000 mcg by mouth daily.   Yes [provider]  warfarin (COUMADIN) 1 MG tablet Take 1 mg by mouth daily. 02/06/18  Yes [provider]  warfarin (COUMADIN) 2.5 MG tablet Take 2.5 mg by mouth daily.    Yes [provider]  bisacodyl (DULCOLAX) 5 MG EC tablet Take 1 tablet (5 mg total) by mouth daily as needed for moderate constipation. Patient not taking: Reported on 08/30/2017 07/17/17   [name], MD  HYDROcodone-acetaminophen (NORCO/VICODIN) 5-325 MG tablet Take 1-2 tablets by mouth every 4 (four) hours as needed for moderate pain. Patient not taking: Reported on 08/30/2017 07/17/17   [name], MD  methocarbamol (ROBAXIN) 500 MG tablet Take 1 tablet (500 mg total) by mouth every 6 (six) hours as needed for muscle spasms. Patient not taking: Reported on 08/30/2017 07/17/17   [name], MD      VITAL SIGNS:  Blood pressure (!) 132/111, pulse 74, temperature (!) 101.6 F (38.7 C), temperature source Axillary, resp. rate (!) 33, height 6' (1.829 m), weight 80 kg, SpO2 94 %.  PHYSICAL EXAMINATION:  Physical Exam  GENERAL:  84 y.o.-year-old patient lying in the bed with no acute distress.  EYES: Pupils equal, round, reactive to light and accommodation. No scleral icterus. Extraocular muscles intact.  HEENT: Head atraumatic, normocephalic. Oropharynx and nasopharynx clear. Dry mucous membranes. NECK:  Supple, no jugular venous distention. No thyroid enlargement, no tenderness.  LUNGS: Normal breath sounds bilaterally, no wheezing, rales, rhonchi or crepitation. No use of accessory muscles of respiration.  CARDIOVASCULAR: RRR, S1, S2 normal. II/VI systolic murmur present. ABDOMEN: +RUQ tenderness to palpation, +murphy's sign, soft, non-distended. Bowel sounds present. No organomegaly or mass. No rebound or guarding. EXTREMITIES: No pedal edema, cyanosis, or clubbing.  NEUROLOGIC: Cranial nerves II through  XII are intact. Muscle strength 5/5 in all extremities. Sensation intact. Gait not checked.  PSYCHIATRIC: The patient is alert. Does not answer questions of orientation SKIN: No obvious rashes. +superficial healing ulcer on the right heel, +dry bandage present over the left heel.  LABORATORY PANEL:   CBC Recent Labs  Lab 03/23/18 1025  WBC 20.7*  HGB 12.7*  HCT 36.3*  PLT 185   ------------------------------------------------------------------------------------------------------------------  Chemistries  Recent Labs  Lab 03/23/18 1215  NA 140  K 3.9  CL 108  CO2 24  GLUCOSE 120*  BUN 25*  CREATININE 1.14  CALCIUM 8.8*  AST 24  ALT 12  ALKPHOS 50  BILITOT 1.1   ------------------------------------------------------------------------------------------------------------------  Cardiac Enzymes No results for input(s): TROPONINI in the last 168 hours. ------------------------------------------------------------------------------------------------------------------  RADIOLOGY:  Ct Chest W Contrast  Result Date: 03/23/2018 CLINICAL DATA:  Nausea, vomiting, fever and cough. Shortness of breath. EXAM: CT CHEST, ABDOMEN, AND PELVIS WITH CONTRAST TECHNIQUE: Multidetector CT imaging of the chest, abdomen and pelvis was performed following the standard protocol during bolus administration of intravenous contrast. CONTRAST:  117mL ISOVUE-300 IOPAMIDOL (ISOVUE-300) INJECTION 61% COMPARISON:  Chest CT angiogram dated 07/15/2017. CT abdomen dated 04/07/2012.  FINDINGS: CT CHEST FINDINGS Cardiovascular: Heart size is upper normal. No pericardial effusion. No thoracic aortic aneurysm or evidence of aortic dissection. Aortic atherosclerosis. Diffuse coronary artery calcifications. No central obstructing pulmonary embolism seen. Mediastinum/Nodes: No mass or enlarged lymph nodes within the mediastinum or perihilar regions. Mildly prominent lymph nodes again noted within the RIGHT paratracheal  space and subcarinal space. Esophagus appears normal. Trachea and central bronchi are unremarkable. Lungs/Pleura: Mild bibasilar atelectasis, most likely chronic. No new lung findings. No evidence of pneumonia. No pleural effusion or pneumothorax. Musculoskeletal: No acute or suspicious osseous finding. Degenerative changes throughout the slightly scoliotic thoracic spine, mild to moderate in degree. CT ABDOMEN PELVIS FINDINGS Hepatobiliary: Gallbladder is distended. Gallbladder walls are thickened and there is pericholecystic inflammation. Multiple stones in the gallbladder neck region, including 1 small stone that is likely in the cystic duct. No focal liver abnormality.  No bile duct dilatation. Pancreas: Unremarkable. No pancreatic ductal dilatation or surrounding inflammatory changes. Spleen: Normal in size without focal abnormality. Adrenals/Urinary Tract: Adrenal glands appear normal. Kidneys are unremarkable without suspicious mass, stone or hydronephrosis. No perinephric inflammation. No ureteral or bladder calculi identified. Bladder appears normal. Stomach/Bowel: No dilated large or small bowel loops. No evidence of bowel wall inflammation. Incidental note is made of cecal displacement to the RIGHT upper quadrant. Extensive diverticulosis of the sigmoid and descending colon but no focal inflammatory change to suggest acute diverticulitis. Stomach appears normal, decompressed. Vascular/Lymphatic: Aortic atherosclerosis. No enlarged abdominal or pelvic lymph nodes. Reproductive: Prostate is unremarkable. Other: No abscess collection.  No free intraperitoneal air. Musculoskeletal: No acute or suspicious osseous finding. Degenerative spondylitic changes throughout the lumbar spine, moderate to severe in degree. Old healed fractures of the bilateral pubic rami. IMPRESSION: 1. Findings are consistent with ACUTE CHOLECYSTITIS. Multiple gallstones in the gallbladder neck region, including 1 small stone that is  likely obstructing within the cystic duct. 2. No other acute findings within the chest, abdomen or pelvis. 3. Colonic diverticulosis without evidence of acute diverticulitis. 4.  Aortic Atherosclerosis (ICD10-I70.0). 5. Additional chronic/incidental findings detailed above. Electronically Signed   By: Franki Cabot M.D.   On: 03/23/2018 13:29   Ct Abdomen Pelvis W Contrast  Result Date: 03/23/2018 CLINICAL DATA:  Nausea, vomiting, fever and cough. Shortness of breath. EXAM: CT CHEST, ABDOMEN, AND PELVIS WITH CONTRAST TECHNIQUE: Multidetector CT imaging of the chest, abdomen and pelvis was performed following the standard protocol during bolus administration of intravenous contrast. CONTRAST:  167mL ISOVUE-300 IOPAMIDOL (ISOVUE-300) INJECTION 61% COMPARISON:  Chest CT angiogram dated 07/15/2017. CT abdomen dated 04/07/2012. FINDINGS: CT CHEST FINDINGS Cardiovascular: Heart size is upper normal. No pericardial effusion. No thoracic aortic aneurysm or evidence of aortic dissection. Aortic atherosclerosis. Diffuse coronary artery calcifications. No central obstructing pulmonary embolism seen. Mediastinum/Nodes: No mass or enlarged lymph nodes within the mediastinum or perihilar regions. Mildly prominent lymph nodes again noted within the RIGHT paratracheal space and subcarinal space. Esophagus appears normal. Trachea and central bronchi are unremarkable. Lungs/Pleura: Mild bibasilar atelectasis, most likely chronic. No new lung findings. No evidence of pneumonia. No pleural effusion or pneumothorax. Musculoskeletal: No acute or suspicious osseous finding. Degenerative changes throughout the slightly scoliotic thoracic spine, mild to moderate in degree. CT ABDOMEN PELVIS FINDINGS Hepatobiliary: Gallbladder is distended. Gallbladder walls are thickened and there is pericholecystic inflammation. Multiple stones in the gallbladder neck region, including 1 small stone that is likely in the cystic duct. No focal liver  abnormality.  No bile duct dilatation. Pancreas: Unremarkable. No pancreatic ductal dilatation  or surrounding inflammatory changes. Spleen: Normal in size without focal abnormality. Adrenals/Urinary Tract: Adrenal glands appear normal. Kidneys are unremarkable without suspicious mass, stone or hydronephrosis. No perinephric inflammation. No ureteral or bladder calculi identified. Bladder appears normal. Stomach/Bowel: No dilated large or small bowel loops. No evidence of bowel wall inflammation. Incidental note is made of cecal displacement to the RIGHT upper quadrant. Extensive diverticulosis of the sigmoid and descending colon but no focal inflammatory change to suggest acute diverticulitis. Stomach appears normal, decompressed. Vascular/Lymphatic: Aortic atherosclerosis. No enlarged abdominal or pelvic lymph nodes. Reproductive: Prostate is unremarkable. Other: No abscess collection.  No free intraperitoneal air. Musculoskeletal: No acute or suspicious osseous finding. Degenerative spondylitic changes throughout the lumbar spine, moderate to severe in degree. Old healed fractures of the bilateral pubic rami. IMPRESSION: 1. Findings are consistent with ACUTE CHOLECYSTITIS. Multiple gallstones in the gallbladder neck region, including 1 small stone that is likely obstructing within the cystic duct. 2. No other acute findings within the chest, abdomen or pelvis. 3. Colonic diverticulosis without evidence of acute diverticulitis. 4.  Aortic Atherosclerosis (ICD10-I70.0). 5. Additional chronic/incidental findings detailed above. Electronically Signed   By: Franki Cabot M.D.   On: 03/23/2018 13:29   Dg Chest Portable 1 View  Result Date: 03/23/2018 CLINICAL DATA:  Nausea and vomiting for several days EXAM: PORTABLE CHEST 1 VIEW COMPARISON:  02/20/2018 FINDINGS: Cardiac shadow is at the upper limits of normal in size. Aortic calcifications are again seen. The lungs are hypoinflated. Mild increased retrocardiac  density is noted likely related atelectasis. No sizable effusion is seen. No bony abnormality is noted. IMPRESSION: Mild left retrocardiac atelectasis. Electronically Signed   By: Inez Catalina M.D.   On: 03/23/2018 10:43      IMPRESSION AND PLAN:   Sepsis secondary to acute cholecystitis- confirmed on CT abdomen. Patient with leukocytosis, fever, and lactic acidosis in the ED. - surgery consulted- recommended cholecystostomy tube to be placed by IR, which will happen today - continue vancomycin, cefepime, and flagyl - s/p 1L NS bolus, will give another bolus and then start on MIVFs - trend lactic acid - blood and urine cultures pending - tylenol and morphine prn pain  Paroxysmal atrial fibrillation- in NSR here. Has not taken his coumadin in 2-3 days.  - coumadin per pharm  Hyperglycemia- no history of diabetes - check a1c  Hypertension- normotensive in the ED. - hold norvasc due to sepsis, can likely restart tomorrow   Hyperlipidemia- stable - continue home statin  History of prostate cancer- stable - continue home terazosin  All the records are reviewed and case discussed with ED provider. Management plans discussed with the patient, family and they are in agreement.  CODE STATUS: DNR  TOTAL TIME TAKING CARE OF THIS PATIENT: 40 minutes.    Berna Spare Jerrelle Michelsen M.D on 03/23/2018 at 2:42 PM  Between 7am to 6pm - Pager - [phone]  After 6pm go to [email or web address] - password [email or web address] [facility]  [facility]  Office  [phone]  CC: Primary care physician; Adin Hector, MD   Note: This dictation was prepared with Dragon dictation along with smaller phrase technology. Any transcriptional errors that result from this process are unintentional.

## 2018-03-23 NOTE — Progress Notes (Signed)
Pharmacy Antibiotic Note  [name] is a 84 y.o. male admitted on 03/23/2018 with sepsis. Antibiotics/antiviral ordered for meningitis. Ampicillin, ceftriaxone and acyclovir discontinued prior to administration. Pharmacy has been consulted for vancomycin and cefepime dosing. Patient also started on metronidazole. Concern for intra-abdominal infection.  Plan: Start cefepime 2 g IV q12h (adjusted for CrCl)  Start vancomycin 750 mg IV q12h with first dose today @ 1900. Goal trough 15-20 mcg/mL. Will obtain trough prior to 5th dose.  Height: 6' (182.9 cm) Weight: 176 lb 5.9 oz (80 kg) IBW/kg (Calculated) : 77.6  Temp (24hrs), Avg:99.9 F (37.7 C), Min:98.1 F (36.7 C), Max:101.6 F (38.7 C)  Recent Labs  Lab 03/23/18 1025 03/23/18 1215  WBC 20.7*  --   CREATININE  --  1.14  LATICACIDVEN 2.5*  --     Estimated Creatinine Clearance: 47.3 mL/min (by C-G formula based on SCr of 1.14 mg/dL).    No Known Allergies  Antimicrobials this admission: Vancomycin 9/15 >> Cefepime 9/15 >> Metronidazole 9/15 >>  Dose adjustments this admission: N/A  Microbiology results: 9/15 UCx: pending    Thank you for allowing pharmacy to be a part of this patient's care.  Tawnya Crook, PharmD Pharmacy Resident  03/23/2018 1:37 PM

## 2018-03-23 NOTE — ED Notes (Signed)
Code sepsis cancelled per dr Joni Fears. Pt will be going to IR in the next few hours for his gallbladder.

## 2018-03-23 NOTE — Progress Notes (Signed)
CODE SEPSIS - PHARMACY COMMUNICATION  **Broad Spectrum Antibiotics should be administered within 1 hour of Sepsis diagnosis**  Time Code Sepsis Called/Page Received: 1200  Antibiotics Ordered: vancomycin/cefepime/ampicillin/acyclovir (ampicillin, acyclovir discontinued)  Time of 1st antibiotic administration: 1239  Additional action taken by pharmacy: none  If necessary, Name of Provider/Nurse Contacted: [place], PharmD Pharmacy Resident  03/23/2018 12:52 PM

## 2018-03-23 NOTE — Progress Notes (Signed)
Family Meeting Note  Advance Directive:yes  Today a meeting took place with the Patient and spouse.  Patient is able to participate.  The following clinical team members were present during this meeting:MD  The following were discussed:Patient's diagnosis: , Patient's progosis: Unable to determine and Goals for treatment: DNR  Additional follow-up to be provided: prn  Time spent during discussion:20 minutes  Eunice Oldaker D Kennede Lusk, MD  

## 2018-03-23 NOTE — Consult Note (Signed)
Surgical Consultation  03/23/2018  [name] is an 84 y.o. male.   Referring Physician: ER MD/PD  QM:[id number]  HPI: This 84 year old patient presented to the emergency room with nausea and vomiting for the last 2 to 3 days.  He has not taken his Coumadin in 2 to 3 days for his atrial fibrillation.  His multiple medical problems.  On work-up he was found to have acute cholecystitis and sepsis.  He definitely has signs of Sirs.  He denies any abdominal pain but his history is obtained mostly from his wife.  He is not able to give a coherent review of systems as his reliability and answering questions is poor.  Past Medical History:  Diagnosis Date  . Atrial fibrillation ([place])   . Cancer ([place])    PROSTATE  . Dysrhythmia    AFIB  . GERD (gastroesophageal reflux disease)   . Hypertension     Past Surgical History:  Procedure Laterality Date  . CAROTID ENDARTERECTOMY Left   . CATARACT EXTRACTION W/PHACO Right 10/13/2015   Procedure: CATARACT EXTRACTION PHACO AND INTRAOCULAR LENS PLACEMENT (IOC);  Surgeon: Leandrew Koyanagi, MD;  Location: [facility] ORS;  Service: Ophthalmology;  Laterality: Right;  Lot # [id number] H US:01:23.3 AP:17.2 CDE:14.31  . CATARACT EXTRACTION W/PHACO Left 12/12/2015   Procedure: CATARACT EXTRACTION PHACO AND INTRAOCULAR LENS PLACEMENT (IOC);  Surgeon: Leandrew Koyanagi, MD;  Location: [facility] ORS;  Service: Ophthalmology;  Laterality: Left;  [place] 1.10 AP% 15.9 CDE 11.22 FLUID PACK LOT # [id number]  . JOINT REPLACEMENT Right    TKR    Family History  Problem Relation Age of Onset  . Heart attack Father     Social History:  reports that he has quit smoking. He has never used smokeless tobacco. He reports that he drinks alcohol. He reports that he does not use drugs.  Allergies: No Known Allergies  Medications reviewed.   Review of Systems:   Review of Systems  Unable to perform ROS: Medical condition     Physical Exam:  BP (!) 131/54    Pulse 66   Temp (!) 101.6 F (38.7 C) (Axillary)   Resp (!) 27   Ht 6' (1.829 m)   Wt 80 kg   SpO2 95%   BMI 23.92 kg/m   Physical Exam  Constitutional: He appears distressed.  Pale and thin appearing male patient he appears uncomfortable  HENT:  Head: Normocephalic and atraumatic.  Eyes: Right eye exhibits discharge. Left eye exhibits discharge. No scleral icterus.  Dry mucous membranes and conjunctival exudates bilaterally.  No icterus  Neck: Normal range of motion. Neck supple.  Cardiovascular: Normal rate.  Irregularly irregular rhythm  Pulmonary/Chest: Effort normal. No stridor. No respiratory distress. He has no wheezes.  Increased respiratory rate at 26  Abdominal: Soft. He exhibits distension. There is tenderness. There is guarding.  Tenderness in both upper quadrants positive Murphy sign  Musculoskeletal: Normal range of motion. He exhibits edema.  Neurological: He is alert.  Patient does not answer questions appropriately but is oriented.  Skin: Skin is warm and dry. No erythema. There is pallor.  Vitals reviewed.     Results for orders placed or performed during the hospital encounter of 03/23/18 (from the past 48 hour(s))  Lactic acid, plasma     Status: Abnormal   Collection Time: 03/23/18 10:25 AM  Result Value Ref Range   Lactic Acid, Venous 2.5 (HH) 0.5 - 1.9 mmol/L    Comment: CRITICAL RESULT CALLED TO, READ BACK BY  AND VERIFIED WITH BRANDY DAVIS ON 03/23/18 AT 1115 QSD Performed at [facility], [address] [place], [place] [address]   Urinalysis, Complete w Microscopic     Status: Abnormal   Collection Time: 03/23/18 10:25 AM  Result Value Ref Range   Color, Urine YELLOW (A) YELLOW   APPearance CLEAR (A) CLEAR   Specific Gravity, Urine 1.019 1.005 - 1.030   pH 6.0 5.0 - 8.0   Glucose, UA NEGATIVE NEGATIVE mg/dL   Hgb urine dipstick NEGATIVE NEGATIVE   Bilirubin Urine NEGATIVE NEGATIVE   Ketones, ur NEGATIVE NEGATIVE mg/dL   Protein,  ur 100 (A) NEGATIVE mg/dL   Nitrite NEGATIVE NEGATIVE   Leukocytes, UA NEGATIVE NEGATIVE   RBC / HPF 0-5 0 - 5 RBC/hpf   WBC, UA 0-5 0 - 5 WBC/hpf   Bacteria, UA NONE SEEN NONE SEEN   Squamous Epithelial / LPF 0-5 0 - 5   Mucus PRESENT     Comment: Performed at [facility], [address] [place], [place] [address]  CBC with Differential     Status: Abnormal   Collection Time: 03/23/18 10:25 AM  Result Value Ref Range   WBC 20.7 (H) 3.8 - 10.6 K/uL   RBC 3.72 (L) 4.40 - 5.90 MIL/uL   Hemoglobin 12.7 (L) 13.0 - 18.0 g/dL   HCT 36.3 (L) 40.0 - 52.0 %   MCV 97.4 80.0 - 100.0 fL   MCH 34.0 26.0 - 34.0 pg   MCHC 34.9 32.0 - 36.0 g/dL   RDW 16.0 (H) 11.5 - 14.5 %   Platelets 185 150 - 440 K/uL   Neutrophils Relative % 92 %   Neutro Abs 19.1 (H) 1.4 - 6.5 K/uL   Lymphocytes Relative 4 %   Lymphs Abs 0.8 (L) 1.0 - 3.6 K/uL   Monocytes Relative 4 %   Monocytes Absolute 0.8 0.2 - 1.0 K/uL   Eosinophils Relative 0 %   Eosinophils Absolute 0.0 0 - 0.7 K/uL   Basophils Relative 0 %   Basophils Absolute 0.0 0 - 0.1 K/uL    Comment: Performed at [facility], [address] [place], [place] [address]  Protime-INR     Status: Abnormal   Collection Time: 03/23/18 10:25 AM  Result Value Ref Range   Prothrombin Time 18.1 (H) 11.4 - 15.2 seconds   INR 1.51     Comment: Performed at [facility], [address] [place], [place] [address]  Comprehensive metabolic panel     Status: Abnormal   Collection Time: 03/23/18 12:15 PM  Result Value Ref Range   Sodium 140 135 - 145 mmol/L   Potassium 3.9 3.5 - 5.1 mmol/L   Chloride 108 98 - 111 mmol/L   CO2 24 22 - 32 mmol/L   Glucose, Bld 120 (H) 70 - 99 mg/dL   BUN 25 (H) 8 - 23 mg/dL   Creatinine, Ser 1.14 0.61 - 1.24 mg/dL   Calcium 8.8 (L) 8.9 - 10.3 mg/dL   Total Protein 6.5 6.5 - 8.1 g/dL   Albumin 3.5 3.5 - 5.0 g/dL   AST 24 15 - 41 U/L   ALT 12 0 - 44 U/L   Alkaline Phosphatase 50 38 - 126 U/L   Total  Bilirubin 1.1 0.3 - 1.2 mg/dL   GFR calc non Af Amer 55 (L) >60 mL/min   GFR calc Af Amer >60 >60 mL/min    Comment: (NOTE) The eGFR has been calculated using the CKD  EPI equation. This calculation has not been validated in all clinical situations. eGFR's persistently <60 mL/min signify possible Chronic Kidney Disease.    Anion gap 8 5 - 15    Comment: Performed at [facility], [address] [place], [place] [address]  Lipase, blood     Status: None   Collection Time: 03/23/18 12:15 PM  Result Value Ref Range   Lipase 19 11 - 51 U/L    Comment: Performed at [name], [address] [place], [place] [address]   Ct Chest W Contrast  Result Date: 03/23/2018 CLINICAL DATA:  Nausea, vomiting, fever and cough. Shortness of breath. EXAM: CT CHEST, ABDOMEN, AND PELVIS WITH CONTRAST TECHNIQUE: Multidetector CT imaging of the chest, abdomen and pelvis was performed following the standard protocol during bolus administration of intravenous contrast. CONTRAST:  137m ISOVUE-300 IOPAMIDOL (ISOVUE-300) INJECTION 61% COMPARISON:  Chest CT angiogram dated 07/15/2017. CT abdomen dated 04/07/2012. FINDINGS: CT CHEST FINDINGS Cardiovascular: Heart size is upper normal. No pericardial effusion. No thoracic aortic aneurysm or evidence of aortic dissection. Aortic atherosclerosis. Diffuse coronary artery calcifications. No central obstructing pulmonary embolism seen. Mediastinum/Nodes: No mass or enlarged lymph nodes within the mediastinum or perihilar regions. Mildly prominent lymph nodes again noted within the RIGHT paratracheal space and subcarinal space. Esophagus appears normal. Trachea and central bronchi are unremarkable. Lungs/Pleura: Mild bibasilar atelectasis, most likely chronic. No new lung findings. No evidence of pneumonia. No pleural effusion or pneumothorax. Musculoskeletal: No acute or suspicious osseous finding. Degenerative changes throughout the slightly scoliotic  thoracic spine, mild to moderate in degree. CT ABDOMEN PELVIS FINDINGS Hepatobiliary: Gallbladder is distended. Gallbladder walls are thickened and there is pericholecystic inflammation. Multiple stones in the gallbladder neck region, including 1 small stone that is likely in the cystic duct. No focal liver abnormality.  No bile duct dilatation. Pancreas: Unremarkable. No pancreatic ductal dilatation or surrounding inflammatory changes. Spleen: Normal in size without focal abnormality. Adrenals/Urinary Tract: Adrenal glands appear normal. Kidneys are unremarkable without suspicious mass, stone or hydronephrosis. No perinephric inflammation. No ureteral or bladder calculi identified. Bladder appears normal. Stomach/Bowel: No dilated large or small bowel loops. No evidence of bowel wall inflammation. Incidental note is made of cecal displacement to the RIGHT upper quadrant. Extensive diverticulosis of the sigmoid and descending colon but no focal inflammatory change to suggest acute diverticulitis. Stomach appears normal, decompressed. Vascular/Lymphatic: Aortic atherosclerosis. No enlarged abdominal or pelvic lymph nodes. Reproductive: Prostate is unremarkable. Other: No abscess collection.  No free intraperitoneal air. Musculoskeletal: No acute or suspicious osseous finding. Degenerative spondylitic changes throughout the lumbar spine, moderate to severe in degree. Old healed fractures of the bilateral pubic rami. IMPRESSION: 1. Findings are consistent with ACUTE CHOLECYSTITIS. Multiple gallstones in the gallbladder neck region, including 1 small stone that is likely obstructing within the cystic duct. 2. No other acute findings within the chest, abdomen or pelvis. 3. Colonic diverticulosis without evidence of acute diverticulitis. 4.  Aortic Atherosclerosis (ICD10-I70.0). 5. Additional chronic/incidental findings detailed above. Electronically Signed   By: SFranki CabotM.D.   On: 03/23/2018 13:29   Ct Abdomen  Pelvis W Contrast  Result Date: 03/23/2018 CLINICAL DATA:  Nausea, vomiting, fever and cough. Shortness of breath. EXAM: CT CHEST, ABDOMEN, AND PELVIS WITH CONTRAST TECHNIQUE: Multidetector CT imaging of the chest, abdomen and pelvis was performed following the standard protocol during bolus administration of intravenous contrast. CONTRAST:  1043mISOVUE-300 IOPAMIDOL (ISOVUE-300) INJECTION 61% COMPARISON:  Chest CT angiogram dated 07/15/2017. CT abdomen dated 04/07/2012.  FINDINGS: CT CHEST FINDINGS Cardiovascular: Heart size is upper normal. No pericardial effusion. No thoracic aortic aneurysm or evidence of aortic dissection. Aortic atherosclerosis. Diffuse coronary artery calcifications. No central obstructing pulmonary embolism seen. Mediastinum/Nodes: No mass or enlarged lymph nodes within the mediastinum or perihilar regions. Mildly prominent lymph nodes again noted within the RIGHT paratracheal space and subcarinal space. Esophagus appears normal. Trachea and central bronchi are unremarkable. Lungs/Pleura: Mild bibasilar atelectasis, most likely chronic. No new lung findings. No evidence of pneumonia. No pleural effusion or pneumothorax. Musculoskeletal: No acute or suspicious osseous finding. Degenerative changes throughout the slightly scoliotic thoracic spine, mild to moderate in degree. CT ABDOMEN PELVIS FINDINGS Hepatobiliary: Gallbladder is distended. Gallbladder walls are thickened and there is pericholecystic inflammation. Multiple stones in the gallbladder neck region, including 1 small stone that is likely in the cystic duct. No focal liver abnormality.  No bile duct dilatation. Pancreas: Unremarkable. No pancreatic ductal dilatation or surrounding inflammatory changes. Spleen: Normal in size without focal abnormality. Adrenals/Urinary Tract: Adrenal glands appear normal. Kidneys are unremarkable without suspicious mass, stone or hydronephrosis. No perinephric inflammation. No ureteral or bladder  calculi identified. Bladder appears normal. Stomach/Bowel: No dilated large or small bowel loops. No evidence of bowel wall inflammation. Incidental note is made of cecal displacement to the RIGHT upper quadrant. Extensive diverticulosis of the sigmoid and descending colon but no focal inflammatory change to suggest acute diverticulitis. Stomach appears normal, decompressed. Vascular/Lymphatic: Aortic atherosclerosis. No enlarged abdominal or pelvic lymph nodes. Reproductive: Prostate is unremarkable. Other: No abscess collection.  No free intraperitoneal air. Musculoskeletal: No acute or suspicious osseous finding. Degenerative spondylitic changes throughout the lumbar spine, moderate to severe in degree. Old healed fractures of the bilateral pubic rami. IMPRESSION: 1. Findings are consistent with ACUTE CHOLECYSTITIS. Multiple gallstones in the gallbladder neck region, including 1 small stone that is likely obstructing within the cystic duct. 2. No other acute findings within the chest, abdomen or pelvis. 3. Colonic diverticulosis without evidence of acute diverticulitis. 4.  Aortic Atherosclerosis (ICD10-I70.0). 5. Additional chronic/incidental findings detailed above. Electronically Signed   By: Franki Cabot M.D.   On: 03/23/2018 13:29   Dg Chest Portable 1 View  Result Date: 03/23/2018 CLINICAL DATA:  Nausea and vomiting for several days EXAM: PORTABLE CHEST 1 VIEW COMPARISON:  02/20/2018 FINDINGS: Cardiac shadow is at the upper limits of normal in size. Aortic calcifications are again seen. The lungs are hypoinflated. Mild increased retrocardiac density is noted likely related atelectasis. No sizable effusion is seen. No bony abnormality is noted. IMPRESSION: Mild left retrocardiac atelectasis. Electronically Signed   By: Inez Catalina M.D.   On: 03/23/2018 10:43    Assessment/Plan:   CT scans personally reviewed suggesting acute cholecystitis Labs are reviewed suggesting sepsis and Sirs.  He has an  elevated white blood cell count. Elevated respiratory rate He also has signs of acute cholecystitis with Sirs.  He is not a surgical candidate at this time because of his Coumadin use and multiple medical problems and frail status as a 84 year old male.  He would benefit from cholecystostomy tube at this time with conservative management and IV antibiotics.  He may become a surgical candidate at a later date but for now he would not be given a general anesthetic under the circumstances.  This discussed with the emergency room physician and he is being admitted by prime doc.  I will follow the patient while in the hospital and have ordered a cholecystostomy tube to be placed by IR.  Florene Glen, MD, FACS

## 2018-03-23 NOTE — Progress Notes (Signed)
Patient clinically stable post choleangio tube placement per DR Pascal Lux, tolerated well, received fentanyl 50 mcg, and versed 1 mg iv for procedure. Report called to care nurse with questions answered.

## 2018-03-24 LAB — COMPREHENSIVE METABOLIC PANEL
ALT: 13 U/L (ref 0–44)
ANION GAP: 6 (ref 5–15)
AST: 20 U/L (ref 15–41)
Albumin: 2.9 g/dL — ABNORMAL LOW (ref 3.5–5.0)
Alkaline Phosphatase: 44 U/L (ref 38–126)
BUN: 31 mg/dL — ABNORMAL HIGH (ref 8–23)
CO2: 21 mmol/L — AB (ref 22–32)
Calcium: 8.7 mg/dL — ABNORMAL LOW (ref 8.9–10.3)
Chloride: 110 mmol/L (ref 98–111)
Creatinine, Ser: 1.05 mg/dL (ref 0.61–1.24)
GFR calc Af Amer: >60 mL/min (ref >60)
Glucose, Bld: 151 mg/dL — ABNORMAL HIGH (ref 70–99)
Potassium: 3.8 mmol/L (ref 3.5–5.1)
Sodium: 137 mmol/L (ref 135–145)
Total Bilirubin: 0.6 mg/dL (ref 0.3–1.2)
Total Protein: 6 g/dL — ABNORMAL LOW (ref 6.5–8.1)

## 2018-03-24 LAB — CBC
HEMATOCRIT: 29.9 % — AB (ref 40.0–52.0)
HEMOGLOBIN: 10.3 g/dL — AB (ref 13.0–18.0)
MCH: 34.1 pg — ABNORMAL HIGH (ref 26.0–34.0)
MCHC: 34.5 g/dL (ref 32.0–36.0)
MCV: 98.9 fL (ref 80.0–100.0)
Platelets: 162 10*3/uL (ref 150–440)
RBC: 3.02 MIL/uL — ABNORMAL LOW (ref 4.40–5.90)
RDW: 16.1 % — ABNORMAL HIGH (ref 11.5–14.5)
WBC: 16.7 10*3/uL — ABNORMAL HIGH (ref 3.8–10.6)

## 2018-03-24 LAB — URINE CULTURE: CULTURE: NO GROWTH

## 2018-03-24 LAB — PROTIME-INR
INR: 1.43
Prothrombin Time: 17.3 seconds — ABNORMAL HIGH (ref 11.4–15.2)

## 2018-03-24 MED ORDER — WARFARIN SODIUM 2 MG PO TABS
2.0000 mg | ORAL_TABLET | Freq: Once | ORAL | Status: AC
  Administered 2018-03-24: 2 mg via ORAL
  Filled 2018-03-24: qty 1

## 2018-03-24 MED ORDER — FLUCONAZOLE 100 MG PO TABS
200.0000 mg | ORAL_TABLET | Freq: Every day | ORAL | Status: DC
  Administered 2018-03-25: 200 mg via ORAL
  Filled 2018-03-24: qty 2

## 2018-03-24 MED ORDER — AMLODIPINE BESYLATE 5 MG PO TABS
5.0000 mg | ORAL_TABLET | Freq: Every day | ORAL | Status: DC
  Administered 2018-03-24 – 2018-03-25 (×2): 5 mg via ORAL
  Filled 2018-03-24 (×2): qty 1

## 2018-03-24 MED ORDER — SODIUM CHLORIDE 0.9 % IV SOLN
2.0000 g | INTRAVENOUS | Status: DC
  Administered 2018-03-24: 2 g via INTRAVENOUS
  Filled 2018-03-24: qty 20
  Filled 2018-03-24: qty 2

## 2018-03-24 MED ORDER — DOCUSATE SODIUM 100 MG PO CAPS
100.0000 mg | ORAL_CAPSULE | Freq: Two times a day (BID) | ORAL | Status: DC | PRN
  Administered 2018-03-24: 100 mg via ORAL
  Filled 2018-03-24: qty 1

## 2018-03-24 MED ORDER — SODIUM CHLORIDE 0.9 % IV SOLN
INTRAVENOUS | Status: DC
  Administered 2018-03-24 (×2): via INTRAVENOUS

## 2018-03-24 MED ORDER — FLUCONAZOLE 100 MG PO TABS
400.0000 mg | ORAL_TABLET | Freq: Every day | ORAL | Status: AC
  Administered 2018-03-24: 400 mg via ORAL
  Filled 2018-03-24: qty 4

## 2018-03-24 NOTE — Progress Notes (Signed)
[place] for Warfarin Indication: atrial fibrillation  No Known Allergies  Patient Measurements: Height: 6' (182.9 cm) Weight: 170 lb (77.1 kg) IBW/kg (Calculated) : 77.6  Vital Signs: Temp: 97.5 F (36.4 C) (09/16 1143) Temp Source: Oral (09/16 1143) BP: 121/59 (09/16 1143) Pulse Rate: 61 (09/16 1143)  Labs: Recent Labs    03/23/18 1025 03/23/18 1215 03/24/18 0428  HGB 12.7*  --  10.3*  HCT 36.3*  --  29.9*  PLT 185  --  162  LABPROT 18.1*  --  17.3*  INR 1.51  --  1.43  CREATININE  --  1.14 1.05    Estimated Creatinine Clearance: 51 mL/min (by C-G formula based on SCr of 1.05 mg/dL).   Medical History: Past Medical History:  Diagnosis Date  . Atrial fibrillation ([place])   . Cancer ([place])    PROSTATE  . Dysrhythmia    AFIB  . GERD (gastroesophageal reflux disease)   . Hypertension     Assessment: Patient is a 84yo male admitted for acute cholecystitis and had a cholecystomy tube placed by IR. Pharmacy consulted for warfarin dosing post IR. Patient's home dose is 3.5 mg daily but did not take for in 2-3 days PTA. There are 2 new DDIs introduced here: metronidazole and fluconazole.  Goal of Therapy:  INR 2-3   Plan:  Will decrease dose of Warfarin to 2mg  today. Daily INR checks to guide dosing.  Vallery Sa, PhamD 03/24/2018 1:24 PM

## 2018-03-24 NOTE — Progress Notes (Signed)
[facility] NAME: [name]    MR#:  [id number]  DATE OF BIRTH:  09-27-1925  SUBJECTIVE: Patient is seen at bedside, had nausea, vomiting, abdominal pain found to have acute cholecystitis with sepsis status post temporary gallbladder drain placed by IR yesterday, today he feels much better and denies any nausea or vomiting.  No abdominal pain eager to go home.  CHIEF COMPLAINT:   Chief Complaint  Patient presents with  . Weakness  Tolerating the diet.  REVIEW OF SYSTEMS:   ROS CONSTITUTIONAL: No fever, fatigue or weakness.  EYES: No blurred or double vision.  EARS, NOSE, AND THROAT: No tinnitus or ear pain.  RESPIRATORY: No cough, shortness of breath, wheezing or hemoptysis.  CARDIOVASCULAR: No chest pain, orthopnea, edema.  GASTROINTESTINAL: No nausea, vomiting, diarrhea or abdominal pain.  Patient has drainage  Bag. GENITOURINARY: No dysuria, hematuria.  ENDOCRINE: No polyuria, nocturia,  HEMATOLOGY: No anemia, easy bruising or bleeding SKIN: No rash or lesion. MUSCULOSKELETAL: No joint pain or arthritis.   NEUROLOGIC: No tingling, numbness, weakness.  PSYCHIATRY: No anxiety or depression.   DRUG ALLERGIES:  No Known Allergies  VITALS:  Blood pressure (!) 121/59, pulse 61, temperature (!) 97.5 F (36.4 C), temperature source Oral, resp. rate 18, height 6' (1.829 m), weight 77.1 kg, SpO2 95 %.  PHYSICAL EXAMINATION:  GENERAL:  84 y.o.-year-old patient lying in the bed with no acute distress.  EYES: Pupils equal, round, reactive to light and accommodation. No scleral icterus. Extraocular muscles intact.  HEENT: Head atraumatic, normocephalic. Oropharynx and nasopharynx clear.  NECK:  Supple, no jugular venous distention. No thyroid enlargement, no tenderness.  LUNGS: Normal breath sounds bilaterally, no wheezing, rales,rhonchi or crepitation. No use of accessory muscles of respiration.  CARDIOVASCULAR: S1, S2  normal. No murmurs, rubs, or gallops.  ABDOMEN: Soft, nontender, nondistended. Bowel sounds present.  Right upper quadrant cholecystostomy tube present with bilious fluid.  XTREMITIES: No pedal edema, cyanosis, or clubbing.  NEUROLOGIC: Cranial nerves II through XII are intact. Muscle strength 5/5 in all extremities. Sensation intact. Gait not checked.  PSYCHIATRIC: The patient is alert and oriented x 3.  SKIN: No obvious rash, lesion, or ulcer.    LABORATORY PANEL:   CBC Recent Labs  Lab 03/24/18 0428  WBC 16.7*  HGB 10.3*  HCT 29.9*  PLT 162   ------------------------------------------------------------------------------------------------------------------  Chemistries  Recent Labs  Lab 03/24/18 0428  NA 137  K 3.8  CL 110  CO2 21*  GLUCOSE 151*  BUN 31*  CREATININE 1.05  CALCIUM 8.7*  AST 20  ALT 13  ALKPHOS 44  BILITOT 0.6   ------------------------------------------------------------------------------------------------------------------  Cardiac Enzymes No results for input(s): TROPONINI in the last 168 hours. ------------------------------------------------------------------------------------------------------------------  RADIOLOGY:  Ct Chest W Contrast  Result Date: 03/23/2018 CLINICAL DATA:  Nausea, vomiting, fever and cough. Shortness of breath. EXAM: CT CHEST, ABDOMEN, AND PELVIS WITH CONTRAST TECHNIQUE: Multidetector CT imaging of the chest, abdomen and pelvis was performed following the standard protocol during bolus administration of intravenous contrast. CONTRAST:  135mL ISOVUE-300 IOPAMIDOL (ISOVUE-300) INJECTION 61% COMPARISON:  Chest CT angiogram dated 07/15/2017. CT abdomen dated 04/07/2012. FINDINGS: CT CHEST FINDINGS Cardiovascular: Heart size is upper normal. No pericardial effusion. No thoracic aortic aneurysm or evidence of aortic dissection. Aortic atherosclerosis. Diffuse coronary artery calcifications. No central obstructing pulmonary embolism  seen. Mediastinum/Nodes: No mass or enlarged lymph nodes within the mediastinum or perihilar regions. Mildly prominent lymph nodes again noted within the  RIGHT paratracheal space and subcarinal space. Esophagus appears normal. Trachea and central bronchi are unremarkable. Lungs/Pleura: Mild bibasilar atelectasis, most likely chronic. No new lung findings. No evidence of pneumonia. No pleural effusion or pneumothorax. Musculoskeletal: No acute or suspicious osseous finding. Degenerative changes throughout the slightly scoliotic thoracic spine, mild to moderate in degree. CT ABDOMEN PELVIS FINDINGS Hepatobiliary: Gallbladder is distended. Gallbladder walls are thickened and there is pericholecystic inflammation. Multiple stones in the gallbladder neck region, including 1 small stone that is likely in the cystic duct. No focal liver abnormality.  No bile duct dilatation. Pancreas: Unremarkable. No pancreatic ductal dilatation or surrounding inflammatory changes. Spleen: Normal in size without focal abnormality. Adrenals/Urinary Tract: Adrenal glands appear normal. Kidneys are unremarkable without suspicious mass, stone or hydronephrosis. No perinephric inflammation. No ureteral or bladder calculi identified. Bladder appears normal. Stomach/Bowel: No dilated large or small bowel loops. No evidence of bowel wall inflammation. Incidental note is made of cecal displacement to the RIGHT upper quadrant. Extensive diverticulosis of the sigmoid and descending colon but no focal inflammatory change to suggest acute diverticulitis. Stomach appears normal, decompressed. Vascular/Lymphatic: Aortic atherosclerosis. No enlarged abdominal or pelvic lymph nodes. Reproductive: Prostate is unremarkable. Other: No abscess collection.  No free intraperitoneal air. Musculoskeletal: No acute or suspicious osseous finding. Degenerative spondylitic changes throughout the lumbar spine, moderate to severe in degree. Old healed fractures of the  bilateral pubic rami. IMPRESSION: 1. Findings are consistent with ACUTE CHOLECYSTITIS. Multiple gallstones in the gallbladder neck region, including 1 small stone that is likely obstructing within the cystic duct. 2. No other acute findings within the chest, abdomen or pelvis. 3. Colonic diverticulosis without evidence of acute diverticulitis. 4.  Aortic Atherosclerosis (ICD10-I70.0). 5. Additional chronic/incidental findings detailed above. Electronically Signed   By: Franki Cabot M.D.   On: 03/23/2018 13:29   [place] Intraoperative  Result Date: 03/23/2018 INDICATION: Acute cholecystitis, poor surgical candidate. Request made for placement of image guided cholecystostomy tube for infection source control purposes. EXAM: ULTRASOUND AND FLUOROSCOPIC-GUIDED CHOLECYSTOSTOMY TUBE PLACEMENT COMPARISON:  CT abdomen and pelvis - earlier same day MEDICATIONS: The patient is currently admitted to the hospital and on intravenous antibiotics. Antibiotics were administered within an appropriate time frame prior to skin puncture. ANESTHESIA/SEDATION: Moderate (conscious) sedation was employed during this procedure. A total of Versed 1 mg and Fentanyl 50 mcg was administered intravenously. Moderate Sedation Time: 11 minutes. The patient's level of consciousness and vital signs were monitored continuously by radiology nursing throughout the procedure under my direct supervision. CONTRAST:  50mL ISOVUE-300 IOPAMIDOL (ISOVUE-300) INJECTION 61% - administered into the gallbladder fossa. FLUOROSCOPY TIME:  42 seconds (15 mGy) COMPLICATIONS: None immediate. PROCEDURE: Informed written consent was obtained from the patient after a discussion of the risks, benefits and alternatives to treatment. Questions regarding the procedure were encouraged and answered. A timeout was performed prior to the initiation of the procedure. The right upper abdominal quadrant was prepped and draped in the usual sterile fashion, and a sterile drape was  applied covering the operative field. Maximum barrier sterile technique with sterile gowns and gloves were used for the procedure. A timeout was performed prior to the initiation of the procedure. Local anesthesia was provided with 1% lidocaine with epinephrine. Ultrasound scanning of the right upper quadrant demonstrates a dilated gallbladder with minimal amount of pericholecystic fluid. Utilizing a transhepatic approach, a 22 gauge needle was advanced into the gallbladder under direct ultrasound guidance. An ultrasound image was saved for documentation purposes. Appropriate intraluminal puncture was confirmed with  the efflux of bile and advancement of an 0.018 wire into the gallbladder lumen. The needle was exchanged for an [place] set. A small amount of aspirated bile was capped and sent to the laboratory for analysis. A small amount of contrast was injected to confirm appropriate intraluminal positioning. Over a Benson wire, a 79.2-French Cook cholecystomy tube was advanced into the gallbladder fossa, coiled and locked. Bile was aspirated and a small amount of contrast was injected as several post procedural spot radiographic images were obtained in various obliquities. The catheter was secured to the skin with suture, connected to a drainage bag and a dressing was placed. The patient tolerated the procedure well without immediate post procedural complication. IMPRESSION: Successful ultrasound and fluoroscopic guided placement of a 10.2 French cholecystostomy tube. Small amount of aspirated bile was capped and sent to the laboratory for analysis. Electronically Signed   By: Sandi Mariscal M.D.   On: 03/23/2018 18:45   Ct Abdomen Pelvis W Contrast  Result Date: 03/23/2018 CLINICAL DATA:  Nausea, vomiting, fever and cough. Shortness of breath. EXAM: CT CHEST, ABDOMEN, AND PELVIS WITH CONTRAST TECHNIQUE: Multidetector CT imaging of the chest, abdomen and pelvis was performed following the standard protocol  during bolus administration of intravenous contrast. CONTRAST:  157mL ISOVUE-300 IOPAMIDOL (ISOVUE-300) INJECTION 61% COMPARISON:  Chest CT angiogram dated 07/15/2017. CT abdomen dated 04/07/2012. FINDINGS: CT CHEST FINDINGS Cardiovascular: Heart size is upper normal. No pericardial effusion. No thoracic aortic aneurysm or evidence of aortic dissection. Aortic atherosclerosis. Diffuse coronary artery calcifications. No central obstructing pulmonary embolism seen. Mediastinum/Nodes: No mass or enlarged lymph nodes within the mediastinum or perihilar regions. Mildly prominent lymph nodes again noted within the RIGHT paratracheal space and subcarinal space. Esophagus appears normal. Trachea and central bronchi are unremarkable. Lungs/Pleura: Mild bibasilar atelectasis, most likely chronic. No new lung findings. No evidence of pneumonia. No pleural effusion or pneumothorax. Musculoskeletal: No acute or suspicious osseous finding. Degenerative changes throughout the slightly scoliotic thoracic spine, mild to moderate in degree. CT ABDOMEN PELVIS FINDINGS Hepatobiliary: Gallbladder is distended. Gallbladder walls are thickened and there is pericholecystic inflammation. Multiple stones in the gallbladder neck region, including 1 small stone that is likely in the cystic duct. No focal liver abnormality.  No bile duct dilatation. Pancreas: Unremarkable. No pancreatic ductal dilatation or surrounding inflammatory changes. Spleen: Normal in size without focal abnormality. Adrenals/Urinary Tract: Adrenal glands appear normal. Kidneys are unremarkable without suspicious mass, stone or hydronephrosis. No perinephric inflammation. No ureteral or bladder calculi identified. Bladder appears normal. Stomach/Bowel: No dilated large or small bowel loops. No evidence of bowel wall inflammation. Incidental note is made of cecal displacement to the RIGHT upper quadrant. Extensive diverticulosis of the sigmoid and descending colon but no  focal inflammatory change to suggest acute diverticulitis. Stomach appears normal, decompressed. Vascular/Lymphatic: Aortic atherosclerosis. No enlarged abdominal or pelvic lymph nodes. Reproductive: Prostate is unremarkable. Other: No abscess collection.  No free intraperitoneal air. Musculoskeletal: No acute or suspicious osseous finding. Degenerative spondylitic changes throughout the lumbar spine, moderate to severe in degree. Old healed fractures of the bilateral pubic rami. IMPRESSION: 1. Findings are consistent with ACUTE CHOLECYSTITIS. Multiple gallstones in the gallbladder neck region, including 1 small stone that is likely obstructing within the cystic duct. 2. No other acute findings within the chest, abdomen or pelvis. 3. Colonic diverticulosis without evidence of acute diverticulitis. 4.  Aortic Atherosclerosis (ICD10-I70.0). 5. Additional chronic/incidental findings detailed above. Electronically Signed   By: Franki Cabot M.D.   On: 03/23/2018 13:29  Ir Perc Cholecystostomy  Result Date: 03/23/2018 INDICATION: Acute cholecystitis, poor surgical candidate. Request made for placement of image guided cholecystostomy tube for infection source control purposes. EXAM: ULTRASOUND AND FLUOROSCOPIC-GUIDED CHOLECYSTOSTOMY TUBE PLACEMENT COMPARISON:  CT abdomen and pelvis - earlier same day MEDICATIONS: The patient is currently admitted to the hospital and on intravenous antibiotics. Antibiotics were administered within an appropriate time frame prior to skin puncture. ANESTHESIA/SEDATION: Moderate (conscious) sedation was employed during this procedure. A total of Versed 1 mg and Fentanyl 50 mcg was administered intravenously. Moderate Sedation Time: 11 minutes. The patient's level of consciousness and vital signs were monitored continuously by radiology nursing throughout the procedure under my direct supervision. CONTRAST:  18mL ISOVUE-300 IOPAMIDOL (ISOVUE-300) INJECTION 61% - administered into the  gallbladder fossa. FLUOROSCOPY TIME:  42 seconds (15 mGy) COMPLICATIONS: None immediate. PROCEDURE: Informed written consent was obtained from the patient after a discussion of the risks, benefits and alternatives to treatment. Questions regarding the procedure were encouraged and answered. A timeout was performed prior to the initiation of the procedure. The right upper abdominal quadrant was prepped and draped in the usual sterile fashion, and a sterile drape was applied covering the operative field. Maximum barrier sterile technique with sterile gowns and gloves were used for the procedure. A timeout was performed prior to the initiation of the procedure. Local anesthesia was provided with 1% lidocaine with epinephrine. Ultrasound scanning of the right upper quadrant demonstrates a dilated gallbladder with minimal amount of pericholecystic fluid. Utilizing a transhepatic approach, a 22 gauge needle was advanced into the gallbladder under direct ultrasound guidance. An ultrasound image was saved for documentation purposes. Appropriate intraluminal puncture was confirmed with the efflux of bile and advancement of an 0.018 wire into the gallbladder lumen. The needle was exchanged for an [place] set. A small amount of aspirated bile was capped and sent to the laboratory for analysis. A small amount of contrast was injected to confirm appropriate intraluminal positioning. Over a Benson wire, a 66.2-French Cook cholecystomy tube was advanced into the gallbladder fossa, coiled and locked. Bile was aspirated and a small amount of contrast was injected as several post procedural spot radiographic images were obtained in various obliquities. The catheter was secured to the skin with suture, connected to a drainage bag and a dressing was placed. The patient tolerated the procedure well without immediate post procedural complication. IMPRESSION: Successful ultrasound and fluoroscopic guided placement of a 10.2 French  cholecystostomy tube. Small amount of aspirated bile was capped and sent to the laboratory for analysis. Electronically Signed   By: Sandi Mariscal M.D.   On: 03/23/2018 18:45   Dg Chest Portable 1 View  Result Date: 03/23/2018 CLINICAL DATA:  Nausea and vomiting for several days EXAM: PORTABLE CHEST 1 VIEW COMPARISON:  02/20/2018 FINDINGS: Cardiac shadow is at the upper limits of normal in size. Aortic calcifications are again seen. The lungs are hypoinflated. Mild increased retrocardiac density is noted likely related atelectasis. No sizable effusion is seen. No bony abnormality is noted. IMPRESSION: Mild left retrocardiac atelectasis. Electronically Signed   By: Inez Catalina M.D.   On: 03/23/2018 10:43    EKG:   Orders placed or performed during the hospital encounter of 03/23/18  . ED EKG  . ED EKG  . EKG 12-Lead  . EKG 12-Lead    ASSESSMENT AND PLAN:   Sepsis present on admission secondary to acute cholecystitis, status post cholecystostomy tube placement by IR yesterday, he feels better today, leukocytosis improved, antibiotics to  be continued today with Rocephin, Flagyl, discontinue cefepime.  Intraoperative cultures showing yeast . Patient is overall improving, long-term goals of cholecystostomy tube in place versus cholecystectomy as an outpatient discussed with the patient.  #2. proximal atrial fibrillation: Patient restarted Coumadin.  Continue. 3.  Hyperlipidemia: Continue statins 4 history of prostate cancer patient will continue 5.  Nonhealing pressure ulcer to left heel, resolved, epithelium to right heel, present on admission, seen by wound care nurse #6 acute renal failure due to sepsis: Improved with IV fluids..  #7 essential hypertension: Norvasc held on admission due to sepsis but no blood pressure is good restart Norvasc. All the records are reviewed and case discussed with Care Management/Social Workerr. Management plans discussed with the patient, family and they are  in agreement.  CODE STATUS: DNR  TOTAL TIME TAKING CARE OF THIS PATIENT: 35 minutes.   POSSIBLE D/C IN 1-2DAYS, DEPENDING ON CLINICAL CONDITION. More than 50% time spent in counseling, coordination of care  [name] M.D on 03/24/2018 at 11:54 AM  Between 7am to 6pm - Pager - [phone]  After 6pm go to [email or web address] - password [email or web address] [facility]  Office  [phone]  CC: Primary care physician; Adin Hector, MD   Note: This dictation was prepared with Dragon dictation along with smaller phrase technology. Any transcriptional errors that result from this process are unintentional.

## 2018-03-24 NOTE — Consult Note (Addendum)
[facility] Nurse wound consult note Reason for Consult:[place] pressure injury to left heel with resolved, fragile epithelium to right heel.   Wound type:pressure injury Pressure Injury POA: Yes Measurement:LEft : 0.2 cm x 0.2 cm unable to appreciate any depth today Right: 0.2 cm new epithelium Wound bed:new epithelial growth noted Drainage (amount, consistency, odor) none noted Periwound: intact Dressing procedure/placement/frequency:Left heel:  Cleanse with NS. Apply small square of Aquacel Ag. Cover with foam dressing. Change MOnday and Friday. Offload pressure to bilateral heels.  Will not follow at this time.  Please re-consult if needed.  Domenic Moras MSN, RN, FNP-BC [facility] Wound, Ostomy, Continence Nurse Pager [phone]

## 2018-03-24 NOTE — Evaluation (Signed)
Occupational Therapy Evaluation Patient Details Name: [name] MRN: [id number] DOB: 1926-03-16 Today's Date: 03/24/2018    History of Present Illness 84yo male pt admitted for sepsis present on admission secondary to acute cholecystitis, status post cholecystostomy tube placement by IR 03/23/2018.   Clinical Impression   Pt seen for OT evaluation this date. Prior to hospital admission, pt was ambulating with a rollator short distances, getting minimal assist with bathing, dressing, and toileting from spouse. Spouse does the med mgt, transportation, and cleaning as well. They live in a 1 story home with a ramped entrance. Currently pt demonstrates impairments in activity tolerance, strength, and balance, requiring assist for ADL tasks and mobility. Formal mobility and ADL assessment limited this date 2/2 nursing in to perform wound care.  Pt would benefit from skilled OT to address noted impairments and functional limitations (see below for any additional details) in order to maximize safety and independence while minimizing falls risk and caregiver burden.  Upon hospital discharge, recommend pt discharge to home with [facility] and assist from spouse based on limited functional assessment. Will further assess next session.      Follow Up Recommendations  Home health OT;Supervision/Assistance - 24 hour(initial recommendation based on limited ADL/mobility assessment - will further assess next date)    Equipment Recommendations  Other (comment)(TBD)    Recommendations for Other Services       Precautions / Restrictions Precautions Precautions: Fall Precaution Comments: per spouse, pt wears offloading boots on B feet (unclear how long but spouse states he's been wearing them since a hip fx in January) Restrictions Weight Bearing Restrictions: No      Mobility Bed Mobility               General bed mobility comments: unable to formally assess - nursing in to perform R heel wound  care and provide medications - will continue to assess  Transfers                 General transfer comment: unable to formally assess - nursing in to perform R heel wound care and provide medications - will continue to assess    Balance                                           ADL either performed or assessed with clinical judgement   ADL Overall ADL's : Needs assistance/impaired Eating/Feeding: Sitting;Independent   Grooming: Sitting;Independent   Upper Body Bathing: Sitting;Minimal assistance;With caregiver independent assisting   Lower Body Bathing: Sit to/from stand;Minimal assistance;With caregiver independent assisting   Upper Body Dressing : Sitting;Minimal assistance;With caregiver independent assisting   Lower Body Dressing: Sit to/from stand;Minimal assistance     Toilet Transfer Details (indicate cue type and reason): pt reports ambulating with RW and transfering to toilet in bathroom today with nursing; OT unable to assess 2/2 nursing in to do wound care; will continue to assess                 Vision Baseline Vision/History: Wears glasses Wears Glasses: Reading only Patient Visual Report: No change from baseline       Perception     Praxis      Pertinent Vitals/Pain Pain Assessment: No/denies pain     Hand Dominance Right   Extremity/Trunk Assessment Upper Extremity Assessment Upper Extremity Assessment: Overall WFL for tasks assessed(grossly [facility] )  Lower Extremity Assessment Lower Extremity Assessment: Defer to PT evaluation(wound on R heel)       Communication Communication Communication: No difficulties   Cognition Arousal/Alertness: Awake/alert Behavior During Therapy: WFL for tasks assessed/performed Overall Cognitive Status: Within Functional Limits for tasks assessed                                 General Comments: pt easily distracted but with cues will redirect easily   General  Comments  RN in room to change dressings to feet/heels    Exercises     Shoulder Instructions      Home Living Family/patient expects to be discharged to:: Private residence Living Arrangements: Spouse/significant other Available Help at Discharge: Family Type of Home: House Home Access: Stairs to enter;Ramped entrance   Entrance Stairs-Rails: Right;Left Home Layout: One level     Bathroom Shower/Tub: Walk-in shower;Tub/shower unit   Bathroom Toilet: Handicapped height     Home Equipment: Environmental consultant - 2 wheels;Cane - single point;Tub bench          Prior Functioning/Environment Level of Independence: Needs assistance  Gait / Transfers Assistance Needed: pt ambulating with a rollator per pt/spouse report ADL's / Homemaking Assistance Needed: Spouse performs cleaning tasks and assists pt with sponge baths and dressing as needed, pt able to perform tub transfers with TTB and spouse assist; spouse manages meds and transportation since pt's hip fx in January 2019            OT Problem List: Decreased knowledge of use of DME or AE;Decreased activity tolerance      OT Treatment/Interventions: Self-care/ADL training;Balance training;Therapeutic exercise;Therapeutic activities;Energy conservation;Cognitive remediation/compensation;DME and/or AE instruction;Patient/family education    OT Goals(Current goals can be found in the care plan section) Acute Rehab OT Goals Patient Stated Goal: to go home and get stronger OT Goal Formulation: With patient/family Time For Goal Achievement: 04/07/18 Potential to Achieve Goals: Good ADL Goals Pt Will Perform Lower Body Dressing: with min assist;with caregiver independent in assisting;sit to/from stand Pt Will Transfer to Toilet: with min guard assist;ambulating;with supervision(comfort height toilet, LRAD for amb) Additional ADL Goal #1: Pt/son will verbalize plan to implement at least 1 learned falls prevention strategy to maximize safety  in the home.  OT Frequency: Min 1X/week   Barriers to D/C:            Co-evaluation              AM-PAC PT "6 Clicks" Daily Activity     Outcome Measure Help from another person eating meals?: None Help from another person taking care of personal grooming?: None Help from another person toileting, which includes using toliet, bedpan, or urinal?: A Little Help from another person bathing (including washing, rinsing, drying)?: A Little Help from another person to put on and taking off regular upper body clothing?: A Little Help from another person to put on and taking off regular lower body clothing?: A Little 6 Click Score: 20   End of Session    Activity Tolerance: Patient tolerated treatment well Patient left: in bed;with call bell/phone within reach;with bed alarm set;with nursing/sitter in room;with family/visitor present  OT Visit Diagnosis: Other abnormalities of gait and mobility (R26.89)                Time: 9767-3419 OT Time Calculation (min): 20 min Charges:  OT General Charges $OT Visit: 1 Visit OT Evaluation $OT Eval Moderate Complexity:  [place], MPH, MS, OTR/L ascom [phone] 03/24/18, 4:08 PM

## 2018-03-24 NOTE — Progress Notes (Signed)
PT Cancellation Note  Patient Details Name: [name] MRN: [id number] DOB: 1926/06/03   Cancelled Treatment:    Reason Eval/Treat Not Completed: Other (comment).  Meeting with pastor and wife, will have to retry at another time as therapist availability and pt allow.   Ramond Dial 03/24/2018, 1:57 PM   Mee Hives, PT MS Acute Rehab Dept. Number: [place] [facility] [place]

## 2018-03-24 NOTE — Progress Notes (Signed)
OT Cancellation Note  Patient Details Name: [name] MRN: [id number] DOB: 1926-03-01   Cancelled Treatment:    Reason Eval/Treat Not Completed: Other (comment). Order received, chart reviewed. Pt with spouse and pastor in the room. Pt politely requesting OT come back in ~1 hour to allow time for visit. Will re-attempt at later time as pt is available and medically appropriate.   Jeni Salles, MPH, MS, OTR/L ascom [phone] 03/24/18, 1:52 PM

## 2018-03-24 NOTE — Progress Notes (Signed)
PT Cancellation Note  Patient Details Name: [name] MRN: [id number] DOB: February 05, 1926   Cancelled Treatment:    Reason Eval/Treat Not Completed: Other (comment). Pt was having nursing care with wound rebandaging about to begin, and OT was awaiting his availability.  Try again in the AM.   Ramond Dial 03/24/2018, 3:18 PM   Mee Hives, PT MS Acute Rehab Dept. Number: [place]

## 2018-03-24 NOTE — Progress Notes (Signed)
CC: Acute cholecystitis Subjective: She was admitted with Sirs and sepsis likely source is acute cholecystitis.  A drain was placed yesterday and his drain is bilious fluid. Patient denies abdominal pain and overall feels better.  No fevers overnight no nausea or vomiting.  Objective: Vital signs in last 24 hours: Temp:  [97.4 F (36.3 C)-101.6 F (38.7 C)] 97.4 F (36.3 C) (09/16 0335) Pulse Rate:  [58-93] 91 (09/16 0335) Resp:  [14-34] 18 (09/16 0335) BP: (123-161)/(51-111) 124/72 (09/16 0335) SpO2:  [91 %-99 %] 97 % (09/16 0335) Weight:  [77.1 kg-80 kg] 77.1 kg (09/15 1745)    Intake/Output from previous day: 09/15 0701 - 09/16 0700 In: 2460.8 [P.O.:180; I.V.:1467.6; IV Piggyback:803.1] Out: 480 [Urine:200; Drains:280] Intake/Output this shift: Total I/O In: 2028 [P.O.:180; I.V.:1429.2; Other:10; IV Piggyback:408.8] Out: 280 [Urine:200; Drains:80]  Physical exam: Vital signs reviewed, much improved afebrile No icterus no jaundice Abdomen is soft nontender Bilious fluid in drain  Lab Results: CBC  Recent Labs    03/23/18 1025 03/24/18 0428  WBC 20.7* 16.7*  HGB 12.7* 10.3*  HCT 36.3* 29.9*  PLT 185 162   BMET Recent Labs    03/23/18 1215 03/24/18 0428  NA 140 137  K 3.9 3.8  CL 108 110  CO2 24 21*  GLUCOSE 120* 151*  BUN 25* 31*  CREATININE 1.14 1.05  CALCIUM 8.8* 8.7*   PT/INR Recent Labs    03/23/18 1025 03/24/18 0428  LABPROT 18.1* 17.3*  INR 1.51 1.43   ABG No results for input(s): PHART, HCO3 in the last 72 hours.  Invalid input(s): PCO2, PO2  Studies/Results: Ct Chest W Contrast  Result Date: 03/23/2018 CLINICAL DATA:  Nausea, vomiting, fever and cough. Shortness of breath. EXAM: CT CHEST, ABDOMEN, AND PELVIS WITH CONTRAST TECHNIQUE: Multidetector CT imaging of the chest, abdomen and pelvis was performed following the standard protocol during bolus administration of intravenous contrast. CONTRAST:  120mL ISOVUE-300 IOPAMIDOL  (ISOVUE-300) INJECTION 61% COMPARISON:  Chest CT angiogram dated 07/15/2017. CT abdomen dated 04/07/2012. FINDINGS: CT CHEST FINDINGS Cardiovascular: Heart size is upper normal. No pericardial effusion. No thoracic aortic aneurysm or evidence of aortic dissection. Aortic atherosclerosis. Diffuse coronary artery calcifications. No central obstructing pulmonary embolism seen. Mediastinum/Nodes: No mass or enlarged lymph nodes within the mediastinum or perihilar regions. Mildly prominent lymph nodes again noted within the RIGHT paratracheal space and subcarinal space. Esophagus appears normal. Trachea and central bronchi are unremarkable. Lungs/Pleura: Mild bibasilar atelectasis, most likely chronic. No new lung findings. No evidence of pneumonia. No pleural effusion or pneumothorax. Musculoskeletal: No acute or suspicious osseous finding. Degenerative changes throughout the slightly scoliotic thoracic spine, mild to moderate in degree. CT ABDOMEN PELVIS FINDINGS Hepatobiliary: Gallbladder is distended. Gallbladder walls are thickened and there is pericholecystic inflammation. Multiple stones in the gallbladder neck region, including 1 small stone that is likely in the cystic duct. No focal liver abnormality.  No bile duct dilatation. Pancreas: Unremarkable. No pancreatic ductal dilatation or surrounding inflammatory changes. Spleen: Normal in size without focal abnormality. Adrenals/Urinary Tract: Adrenal glands appear normal. Kidneys are unremarkable without suspicious mass, stone or hydronephrosis. No perinephric inflammation. No ureteral or bladder calculi identified. Bladder appears normal. Stomach/Bowel: No dilated large or small bowel loops. No evidence of bowel wall inflammation. Incidental note is made of cecal displacement to the RIGHT upper quadrant. Extensive diverticulosis of the sigmoid and descending colon but no focal inflammatory change to suggest acute diverticulitis. Stomach appears normal,  decompressed. Vascular/Lymphatic: Aortic atherosclerosis. No enlarged abdominal or pelvic  lymph nodes. Reproductive: Prostate is unremarkable. Other: No abscess collection.  No free intraperitoneal air. Musculoskeletal: No acute or suspicious osseous finding. Degenerative spondylitic changes throughout the lumbar spine, moderate to severe in degree. Old healed fractures of the bilateral pubic rami. IMPRESSION: 1. Findings are consistent with ACUTE CHOLECYSTITIS. Multiple gallstones in the gallbladder neck region, including 1 small stone that is likely obstructing within the cystic duct. 2. No other acute findings within the chest, abdomen or pelvis. 3. Colonic diverticulosis without evidence of acute diverticulitis. 4.  Aortic Atherosclerosis (ICD10-I70.0). 5. Additional chronic/incidental findings detailed above. Electronically Signed   By: Franki Cabot M.D.   On: 03/23/2018 13:29   [place] Intraoperative  Result Date: 03/23/2018 INDICATION: Acute cholecystitis, poor surgical candidate. Request made for placement of image guided cholecystostomy tube for infection source control purposes. EXAM: ULTRASOUND AND FLUOROSCOPIC-GUIDED CHOLECYSTOSTOMY TUBE PLACEMENT COMPARISON:  CT abdomen and pelvis - earlier same day MEDICATIONS: The patient is currently admitted to the hospital and on intravenous antibiotics. Antibiotics were administered within an appropriate time frame prior to skin puncture. ANESTHESIA/SEDATION: Moderate (conscious) sedation was employed during this procedure. A total of Versed 1 mg and Fentanyl 50 mcg was administered intravenously. Moderate Sedation Time: 11 minutes. The patient's level of consciousness and vital signs were monitored continuously by radiology nursing throughout the procedure under my direct supervision. CONTRAST:  16mL ISOVUE-300 IOPAMIDOL (ISOVUE-300) INJECTION 61% - administered into the gallbladder fossa. FLUOROSCOPY TIME:  42 seconds (15 mGy) COMPLICATIONS: None immediate.  PROCEDURE: Informed written consent was obtained from the patient after a discussion of the risks, benefits and alternatives to treatment. Questions regarding the procedure were encouraged and answered. A timeout was performed prior to the initiation of the procedure. The right upper abdominal quadrant was prepped and draped in the usual sterile fashion, and a sterile drape was applied covering the operative field. Maximum barrier sterile technique with sterile gowns and gloves were used for the procedure. A timeout was performed prior to the initiation of the procedure. Local anesthesia was provided with 1% lidocaine with epinephrine. Ultrasound scanning of the right upper quadrant demonstrates a dilated gallbladder with minimal amount of pericholecystic fluid. Utilizing a transhepatic approach, a 22 gauge needle was advanced into the gallbladder under direct ultrasound guidance. An ultrasound image was saved for documentation purposes. Appropriate intraluminal puncture was confirmed with the efflux of bile and advancement of an 0.018 wire into the gallbladder lumen. The needle was exchanged for an [place] set. A small amount of aspirated bile was capped and sent to the laboratory for analysis. A small amount of contrast was injected to confirm appropriate intraluminal positioning. Over a Benson wire, a 46.2-French Cook cholecystomy tube was advanced into the gallbladder fossa, coiled and locked. Bile was aspirated and a small amount of contrast was injected as several post procedural spot radiographic images were obtained in various obliquities. The catheter was secured to the skin with suture, connected to a drainage bag and a dressing was placed. The patient tolerated the procedure well without immediate post procedural complication. IMPRESSION: Successful ultrasound and fluoroscopic guided placement of a 10.2 French cholecystostomy tube. Small amount of aspirated bile was capped and sent to the laboratory for  analysis. Electronically Signed   By: Sandi Mariscal M.D.   On: 03/23/2018 18:45   Ct Abdomen Pelvis W Contrast  Result Date: 03/23/2018 CLINICAL DATA:  Nausea, vomiting, fever and cough. Shortness of breath. EXAM: CT CHEST, ABDOMEN, AND PELVIS WITH CONTRAST TECHNIQUE: Multidetector CT imaging of the chest,  abdomen and pelvis was performed following the standard protocol during bolus administration of intravenous contrast. CONTRAST:  172mL ISOVUE-300 IOPAMIDOL (ISOVUE-300) INJECTION 61% COMPARISON:  Chest CT angiogram dated 07/15/2017. CT abdomen dated 04/07/2012. FINDINGS: CT CHEST FINDINGS Cardiovascular: Heart size is upper normal. No pericardial effusion. No thoracic aortic aneurysm or evidence of aortic dissection. Aortic atherosclerosis. Diffuse coronary artery calcifications. No central obstructing pulmonary embolism seen. Mediastinum/Nodes: No mass or enlarged lymph nodes within the mediastinum or perihilar regions. Mildly prominent lymph nodes again noted within the RIGHT paratracheal space and subcarinal space. Esophagus appears normal. Trachea and central bronchi are unremarkable. Lungs/Pleura: Mild bibasilar atelectasis, most likely chronic. No new lung findings. No evidence of pneumonia. No pleural effusion or pneumothorax. Musculoskeletal: No acute or suspicious osseous finding. Degenerative changes throughout the slightly scoliotic thoracic spine, mild to moderate in degree. CT ABDOMEN PELVIS FINDINGS Hepatobiliary: Gallbladder is distended. Gallbladder walls are thickened and there is pericholecystic inflammation. Multiple stones in the gallbladder neck region, including 1 small stone that is likely in the cystic duct. No focal liver abnormality.  No bile duct dilatation. Pancreas: Unremarkable. No pancreatic ductal dilatation or surrounding inflammatory changes. Spleen: Normal in size without focal abnormality. Adrenals/Urinary Tract: Adrenal glands appear normal. Kidneys are unremarkable without  suspicious mass, stone or hydronephrosis. No perinephric inflammation. No ureteral or bladder calculi identified. Bladder appears normal. Stomach/Bowel: No dilated large or small bowel loops. No evidence of bowel wall inflammation. Incidental note is made of cecal displacement to the RIGHT upper quadrant. Extensive diverticulosis of the sigmoid and descending colon but no focal inflammatory change to suggest acute diverticulitis. Stomach appears normal, decompressed. Vascular/Lymphatic: Aortic atherosclerosis. No enlarged abdominal or pelvic lymph nodes. Reproductive: Prostate is unremarkable. Other: No abscess collection.  No free intraperitoneal air. Musculoskeletal: No acute or suspicious osseous finding. Degenerative spondylitic changes throughout the lumbar spine, moderate to severe in degree. Old healed fractures of the bilateral pubic rami. IMPRESSION: 1. Findings are consistent with ACUTE CHOLECYSTITIS. Multiple gallstones in the gallbladder neck region, including 1 small stone that is likely obstructing within the cystic duct. 2. No other acute findings within the chest, abdomen or pelvis. 3. Colonic diverticulosis without evidence of acute diverticulitis. 4.  Aortic Atherosclerosis (ICD10-I70.0). 5. Additional chronic/incidental findings detailed above. Electronically Signed   By: Franki Cabot M.D.   On: 03/23/2018 13:29   Ir Perc Cholecystostomy  Result Date: 03/23/2018 INDICATION: Acute cholecystitis, poor surgical candidate. Request made for placement of image guided cholecystostomy tube for infection source control purposes. EXAM: ULTRASOUND AND FLUOROSCOPIC-GUIDED CHOLECYSTOSTOMY TUBE PLACEMENT COMPARISON:  CT abdomen and pelvis - earlier same day MEDICATIONS: The patient is currently admitted to the hospital and on intravenous antibiotics. Antibiotics were administered within an appropriate time frame prior to skin puncture. ANESTHESIA/SEDATION: Moderate (conscious) sedation was employed during  this procedure. A total of Versed 1 mg and Fentanyl 50 mcg was administered intravenously. Moderate Sedation Time: 11 minutes. The patient's level of consciousness and vital signs were monitored continuously by radiology nursing throughout the procedure under my direct supervision. CONTRAST:  14mL ISOVUE-300 IOPAMIDOL (ISOVUE-300) INJECTION 61% - administered into the gallbladder fossa. FLUOROSCOPY TIME:  42 seconds (15 mGy) COMPLICATIONS: None immediate. PROCEDURE: Informed written consent was obtained from the patient after a discussion of the risks, benefits and alternatives to treatment. Questions regarding the procedure were encouraged and answered. A timeout was performed prior to the initiation of the procedure. The right upper abdominal quadrant was prepped and draped in the usual sterile fashion, and a  sterile drape was applied covering the operative field. Maximum barrier sterile technique with sterile gowns and gloves were used for the procedure. A timeout was performed prior to the initiation of the procedure. Local anesthesia was provided with 1% lidocaine with epinephrine. Ultrasound scanning of the right upper quadrant demonstrates a dilated gallbladder with minimal amount of pericholecystic fluid. Utilizing a transhepatic approach, a 22 gauge needle was advanced into the gallbladder under direct ultrasound guidance. An ultrasound image was saved for documentation purposes. Appropriate intraluminal puncture was confirmed with the efflux of bile and advancement of an 0.018 wire into the gallbladder lumen. The needle was exchanged for an [place] set. A small amount of aspirated bile was capped and sent to the laboratory for analysis. A small amount of contrast was injected to confirm appropriate intraluminal positioning. Over a Benson wire, a 58.2-French Cook cholecystomy tube was advanced into the gallbladder fossa, coiled and locked. Bile was aspirated and a small amount of contrast was injected as  several post procedural spot radiographic images were obtained in various obliquities. The catheter was secured to the skin with suture, connected to a drainage bag and a dressing was placed. The patient tolerated the procedure well without immediate post procedural complication. IMPRESSION: Successful ultrasound and fluoroscopic guided placement of a 10.2 French cholecystostomy tube. Small amount of aspirated bile was capped and sent to the laboratory for analysis. Electronically Signed   By: Sandi Mariscal M.D.   On: 03/23/2018 18:45   Dg Chest Portable 1 View  Result Date: 03/23/2018 CLINICAL DATA:  Nausea and vomiting for several days EXAM: PORTABLE CHEST 1 VIEW COMPARISON:  02/20/2018 FINDINGS: Cardiac shadow is at the upper limits of normal in size. Aortic calcifications are again seen. The lungs are hypoinflated. Mild increased retrocardiac density is noted likely related atelectasis. No sizable effusion is seen. No bony abnormality is noted. IMPRESSION: Mild left retrocardiac atelectasis. Electronically Signed   By: Inez Catalina M.D.   On: 03/23/2018 10:43    Anti-infectives: Anti-infectives (From admission, onward)   Start     Dose/Rate Route Frequency Ordered Stop   03/24/18 0200  ceFEPIme (MAXIPIME) 2 g in sodium chloride 0.9 % 100 mL IVPB     2 g 200 mL/hr over 30 Minutes Intravenous Every 12 hours 03/23/18 1341     03/23/18 1900  vancomycin (VANCOCIN) IVPB 750 mg/150 ml premix     750 mg 150 mL/hr over 60 Minutes Intravenous Every 12 hours 03/23/18 1341     03/23/18 1245  ceFEPIme (MAXIPIME) 2 g in sodium chloride 0.9 % 100 mL IVPB     2 g 200 mL/hr over 30 Minutes Intravenous  Once 03/23/18 1242 03/23/18 1542   03/23/18 1245  metroNIDAZOLE (FLAGYL) IVPB 500 mg     500 mg 100 mL/hr over 60 Minutes Intravenous Every 8 hours 03/23/18 1242     03/23/18 1215  cefTRIAXone (ROCEPHIN) 2 g in sodium chloride 0.9 % 100 mL IVPB  Status:  Discontinued     2 g 200 mL/hr over 30 Minutes  Intravenous Every 12 hours 03/23/18 1202 03/23/18 1242   03/23/18 1215  vancomycin (VANCOCIN) IVPB 1000 mg/200 mL premix     1,000 mg 200 mL/hr over 60 Minutes Intravenous  Once 03/23/18 1202 03/23/18 1349   03/23/18 1215  ampicillin (OMNIPEN) 2 g in sodium chloride 0.9 % 100 mL IVPB  Status:  Discontinued     2 g 300 mL/hr over 20 Minutes Intravenous  Once 03/23/18 1202 03/23/18 1242  03/23/18 1215  acyclovir (ZOVIRAX) 800 mg in dextrose 5 % 150 mL IVPB  Status:  Discontinued     10 mg/kg  80 kg 166 mL/hr over 60 Minutes Intravenous  Once 03/23/18 1202 03/23/18 1242   03/23/18 1215  cefTRIAXone (ROCEPHIN) 2 g in sodium chloride 0.9 % 100 mL IVPB  Status:  Discontinued     2 g 200 mL/hr over 30 Minutes Intravenous  Once 03/23/18 1204 03/23/18 1242      Assessment/Plan:  Vision with acute cholecystitis sepsis and Sirs.  He is on IV antibiotics and a drain was placed into his gallbladder by IR yesterday.  He overall his vital signs of improved dramatically he is no longer acidotic.  His respiratory rate and heart rate are improved. Could advance diet today.  Plans for anticoagulation today. Discussed with patient and wife the long-term goals of therapy with a cholecystostomy tube in place.  Cholecystectomy could be performed at some point if he were to improve considerably otherwise it would stay in indefinitely.   Florene Glen, MD, FACS  03/24/2018

## 2018-03-25 ENCOUNTER — Ambulatory Visit: Payer: Medicare Other | Admitting: Physician Assistant

## 2018-03-25 LAB — CBC
HCT: 30.8 % — ABNORMAL LOW (ref 40.0–52.0)
Hemoglobin: 10.8 g/dL — ABNORMAL LOW (ref 13.0–18.0)
MCH: 34.5 pg — ABNORMAL HIGH (ref 26.0–34.0)
MCHC: 35 g/dL (ref 32.0–36.0)
MCV: 98.5 fL (ref 80.0–100.0)
Platelets: 168 10*3/uL (ref 150–440)
RBC: 3.13 MIL/uL — ABNORMAL LOW (ref 4.40–5.90)
RDW: 15.9 % — AB (ref 11.5–14.5)
WBC: 11.6 10*3/uL — ABNORMAL HIGH (ref 3.8–10.6)

## 2018-03-25 LAB — HEMOGLOBIN A1C
Hgb A1c MFr Bld: 5.5 % (ref 4.8–5.6)
Mean Plasma Glucose: 111 mg/dL

## 2018-03-25 LAB — PROTIME-INR
INR: 1.4
PROTHROMBIN TIME: 17 s — AB (ref 11.4–15.2)

## 2018-03-25 MED ORDER — AMOXICILLIN-POT CLAVULANATE 875-125 MG PO TABS: 1.0000 | ORAL_TABLET | Freq: Two times a day (BID) | ORAL | 0 refills | Status: AC

## 2018-03-25 MED ORDER — METRONIDAZOLE 500 MG PO TABS: 500.0000 mg | ORAL_TABLET | Freq: Three times a day (TID) | ORAL | 0 refills | Status: AC

## 2018-03-25 MED ORDER — FLUCONAZOLE 200 MG PO TABS: 200.0000 mg | ORAL_TABLET | Freq: Every day | ORAL | 0 refills | Status: DC

## 2018-03-25 MED ORDER — WARFARIN SODIUM 3 MG PO TABS
3.5000 mg | ORAL_TABLET | Freq: Once | ORAL | Status: AC
  Administered 2018-03-25: 3.5 mg via ORAL
  Filled 2018-03-25: qty 1

## 2018-03-25 NOTE — Progress Notes (Signed)
Pt alert and oriented to baseline, no acute distress. Discharge instructions given to patient and wife, verbalized understanding.

## 2018-03-25 NOTE — Progress Notes (Signed)
CC: Acute cholecystitis Subjective: Patient with multiple medical problems who had a cholecystostomy tube placed for acute cholecystitis.  Is no pain today.  Tolerating a regular diet.  Objective: Vital signs in last 24 hours: Temp:  [97.3 F (36.3 C)-97.9 F (36.6 C)] 97.9 F (36.6 C) (09/17 0418) Pulse Rate:  [58-70] 58 (09/17 0418) Resp:  [18-20] 20 (09/17 0418) BP: (121-146)/(54-76) 134/76 (09/17 0418) SpO2:  [93 %-96 %] 96 % (09/17 0418) Last BM Date: 03/24/18  Intake/Output from previous day: 09/16 0701 - 09/17 0700 In: 633.8 [I.V.:382; IV Piggyback:251.7] Out: 375 [Urine:300; Drains:75] Intake/Output this shift: Total I/O In: -  Out: 150 [Urine:150]  Physical exam:  Afebrile vital signs reviewed. Bile in drain no purulence soft nontender abdomen negative Murphy sign  Lab Results: CBC  Recent Labs    03/24/18 0428 03/25/18 0719  WBC 16.7* 11.6*  HGB 10.3* 10.8*  HCT 29.9* 30.8*  PLT 162 168   BMET Recent Labs    03/23/18 1215 03/24/18 0428  NA 140 137  K 3.9 3.8  CL 108 110  CO2 24 21*  GLUCOSE 120* 151*  BUN 25* 31*  CREATININE 1.14 1.05  CALCIUM 8.8* 8.7*   PT/INR Recent Labs    03/23/18 1025 03/24/18 0428  LABPROT 18.1* 17.3*  INR 1.51 1.43   ABG No results for input(s): PHART, HCO3 in the last 72 hours.  Invalid input(s): PCO2, PO2  Studies/Results: Ct Chest W Contrast  Result Date: 03/23/2018 CLINICAL DATA:  Nausea, vomiting, fever and cough. Shortness of breath. EXAM: CT CHEST, ABDOMEN, AND PELVIS WITH CONTRAST TECHNIQUE: Multidetector CT imaging of the chest, abdomen and pelvis was performed following the standard protocol during bolus administration of intravenous contrast. CONTRAST:  110mL ISOVUE-300 IOPAMIDOL (ISOVUE-300) INJECTION 61% COMPARISON:  Chest CT angiogram dated 07/15/2017. CT abdomen dated 04/07/2012. FINDINGS: CT CHEST FINDINGS Cardiovascular: Heart size is upper normal. No pericardial effusion. No thoracic aortic  aneurysm or evidence of aortic dissection. Aortic atherosclerosis. Diffuse coronary artery calcifications. No central obstructing pulmonary embolism seen. Mediastinum/Nodes: No mass or enlarged lymph nodes within the mediastinum or perihilar regions. Mildly prominent lymph nodes again noted within the RIGHT paratracheal space and subcarinal space. Esophagus appears normal. Trachea and central bronchi are unremarkable. Lungs/Pleura: Mild bibasilar atelectasis, most likely chronic. No new lung findings. No evidence of pneumonia. No pleural effusion or pneumothorax. Musculoskeletal: No acute or suspicious osseous finding. Degenerative changes throughout the slightly scoliotic thoracic spine, mild to moderate in degree. CT ABDOMEN PELVIS FINDINGS Hepatobiliary: Gallbladder is distended. Gallbladder walls are thickened and there is pericholecystic inflammation. Multiple stones in the gallbladder neck region, including 1 small stone that is likely in the cystic duct. No focal liver abnormality.  No bile duct dilatation. Pancreas: Unremarkable. No pancreatic ductal dilatation or surrounding inflammatory changes. Spleen: Normal in size without focal abnormality. Adrenals/Urinary Tract: Adrenal glands appear normal. Kidneys are unremarkable without suspicious mass, stone or hydronephrosis. No perinephric inflammation. No ureteral or bladder calculi identified. Bladder appears normal. Stomach/Bowel: No dilated large or small bowel loops. No evidence of bowel wall inflammation. Incidental note is made of cecal displacement to the RIGHT upper quadrant. Extensive diverticulosis of the sigmoid and descending colon but no focal inflammatory change to suggest acute diverticulitis. Stomach appears normal, decompressed. Vascular/Lymphatic: Aortic atherosclerosis. No enlarged abdominal or pelvic lymph nodes. Reproductive: Prostate is unremarkable. Other: No abscess collection.  No free intraperitoneal air. Musculoskeletal: No acute or  suspicious osseous finding. Degenerative spondylitic changes throughout the lumbar spine, moderate to  severe in degree. Old healed fractures of the bilateral pubic rami. IMPRESSION: 1. Findings are consistent with ACUTE CHOLECYSTITIS. Multiple gallstones in the gallbladder neck region, including 1 small stone that is likely obstructing within the cystic duct. 2. No other acute findings within the chest, abdomen or pelvis. 3. Colonic diverticulosis without evidence of acute diverticulitis. 4.  Aortic Atherosclerosis (ICD10-I70.0). 5. Additional chronic/incidental findings detailed above. Electronically Signed   By: Franki Cabot M.D.   On: 03/23/2018 13:29   [place] Intraoperative  Result Date: 03/23/2018 INDICATION: Acute cholecystitis, poor surgical candidate. Request made for placement of image guided cholecystostomy tube for infection source control purposes. EXAM: ULTRASOUND AND FLUOROSCOPIC-GUIDED CHOLECYSTOSTOMY TUBE PLACEMENT COMPARISON:  CT abdomen and pelvis - earlier same day MEDICATIONS: The patient is currently admitted to the hospital and on intravenous antibiotics. Antibiotics were administered within an appropriate time frame prior to skin puncture. ANESTHESIA/SEDATION: Moderate (conscious) sedation was employed during this procedure. A total of Versed 1 mg and Fentanyl 50 mcg was administered intravenously. Moderate Sedation Time: 11 minutes. The patient's level of consciousness and vital signs were monitored continuously by radiology nursing throughout the procedure under my direct supervision. CONTRAST:  42mL ISOVUE-300 IOPAMIDOL (ISOVUE-300) INJECTION 61% - administered into the gallbladder fossa. FLUOROSCOPY TIME:  42 seconds (15 mGy) COMPLICATIONS: None immediate. PROCEDURE: Informed written consent was obtained from the patient after a discussion of the risks, benefits and alternatives to treatment. Questions regarding the procedure were encouraged and answered. A timeout was performed prior to  the initiation of the procedure. The right upper abdominal quadrant was prepped and draped in the usual sterile fashion, and a sterile drape was applied covering the operative field. Maximum barrier sterile technique with sterile gowns and gloves were used for the procedure. A timeout was performed prior to the initiation of the procedure. Local anesthesia was provided with 1% lidocaine with epinephrine. Ultrasound scanning of the right upper quadrant demonstrates a dilated gallbladder with minimal amount of pericholecystic fluid. Utilizing a transhepatic approach, a 22 gauge needle was advanced into the gallbladder under direct ultrasound guidance. An ultrasound image was saved for documentation purposes. Appropriate intraluminal puncture was confirmed with the efflux of bile and advancement of an 0.018 wire into the gallbladder lumen. The needle was exchanged for an [place] set. A small amount of aspirated bile was capped and sent to the laboratory for analysis. A small amount of contrast was injected to confirm appropriate intraluminal positioning. Over a Benson wire, a 43.2-French Cook cholecystomy tube was advanced into the gallbladder fossa, coiled and locked. Bile was aspirated and a small amount of contrast was injected as several post procedural spot radiographic images were obtained in various obliquities. The catheter was secured to the skin with suture, connected to a drainage bag and a dressing was placed. The patient tolerated the procedure well without immediate post procedural complication. IMPRESSION: Successful ultrasound and fluoroscopic guided placement of a 10.2 French cholecystostomy tube. Small amount of aspirated bile was capped and sent to the laboratory for analysis. Electronically Signed   By: Sandi Mariscal M.D.   On: 03/23/2018 18:45   Ct Abdomen Pelvis W Contrast  Result Date: 03/23/2018 CLINICAL DATA:  Nausea, vomiting, fever and cough. Shortness of breath. EXAM: CT CHEST, ABDOMEN,  AND PELVIS WITH CONTRAST TECHNIQUE: Multidetector CT imaging of the chest, abdomen and pelvis was performed following the standard protocol during bolus administration of intravenous contrast. CONTRAST:  162mL ISOVUE-300 IOPAMIDOL (ISOVUE-300) INJECTION 61% COMPARISON:  Chest CT angiogram dated 07/15/2017. CT  abdomen dated 04/07/2012. FINDINGS: CT CHEST FINDINGS Cardiovascular: Heart size is upper normal. No pericardial effusion. No thoracic aortic aneurysm or evidence of aortic dissection. Aortic atherosclerosis. Diffuse coronary artery calcifications. No central obstructing pulmonary embolism seen. Mediastinum/Nodes: No mass or enlarged lymph nodes within the mediastinum or perihilar regions. Mildly prominent lymph nodes again noted within the RIGHT paratracheal space and subcarinal space. Esophagus appears normal. Trachea and central bronchi are unremarkable. Lungs/Pleura: Mild bibasilar atelectasis, most likely chronic. No new lung findings. No evidence of pneumonia. No pleural effusion or pneumothorax. Musculoskeletal: No acute or suspicious osseous finding. Degenerative changes throughout the slightly scoliotic thoracic spine, mild to moderate in degree. CT ABDOMEN PELVIS FINDINGS Hepatobiliary: Gallbladder is distended. Gallbladder walls are thickened and there is pericholecystic inflammation. Multiple stones in the gallbladder neck region, including 1 small stone that is likely in the cystic duct. No focal liver abnormality.  No bile duct dilatation. Pancreas: Unremarkable. No pancreatic ductal dilatation or surrounding inflammatory changes. Spleen: Normal in size without focal abnormality. Adrenals/Urinary Tract: Adrenal glands appear normal. Kidneys are unremarkable without suspicious mass, stone or hydronephrosis. No perinephric inflammation. No ureteral or bladder calculi identified. Bladder appears normal. Stomach/Bowel: No dilated large or small bowel loops. No evidence of bowel wall inflammation.  Incidental note is made of cecal displacement to the RIGHT upper quadrant. Extensive diverticulosis of the sigmoid and descending colon but no focal inflammatory change to suggest acute diverticulitis. Stomach appears normal, decompressed. Vascular/Lymphatic: Aortic atherosclerosis. No enlarged abdominal or pelvic lymph nodes. Reproductive: Prostate is unremarkable. Other: No abscess collection.  No free intraperitoneal air. Musculoskeletal: No acute or suspicious osseous finding. Degenerative spondylitic changes throughout the lumbar spine, moderate to severe in degree. Old healed fractures of the bilateral pubic rami. IMPRESSION: 1. Findings are consistent with ACUTE CHOLECYSTITIS. Multiple gallstones in the gallbladder neck region, including 1 small stone that is likely obstructing within the cystic duct. 2. No other acute findings within the chest, abdomen or pelvis. 3. Colonic diverticulosis without evidence of acute diverticulitis. 4.  Aortic Atherosclerosis (ICD10-I70.0). 5. Additional chronic/incidental findings detailed above. Electronically Signed   By: Franki Cabot M.D.   On: 03/23/2018 13:29   Ir Perc Cholecystostomy  Result Date: 03/23/2018 INDICATION: Acute cholecystitis, poor surgical candidate. Request made for placement of image guided cholecystostomy tube for infection source control purposes. EXAM: ULTRASOUND AND FLUOROSCOPIC-GUIDED CHOLECYSTOSTOMY TUBE PLACEMENT COMPARISON:  CT abdomen and pelvis - earlier same day MEDICATIONS: The patient is currently admitted to the hospital and on intravenous antibiotics. Antibiotics were administered within an appropriate time frame prior to skin puncture. ANESTHESIA/SEDATION: Moderate (conscious) sedation was employed during this procedure. A total of Versed 1 mg and Fentanyl 50 mcg was administered intravenously. Moderate Sedation Time: 11 minutes. The patient's level of consciousness and vital signs were monitored continuously by radiology nursing  throughout the procedure under my direct supervision. CONTRAST:  29mL ISOVUE-300 IOPAMIDOL (ISOVUE-300) INJECTION 61% - administered into the gallbladder fossa. FLUOROSCOPY TIME:  42 seconds (15 mGy) COMPLICATIONS: None immediate. PROCEDURE: Informed written consent was obtained from the patient after a discussion of the risks, benefits and alternatives to treatment. Questions regarding the procedure were encouraged and answered. A timeout was performed prior to the initiation of the procedure. The right upper abdominal quadrant was prepped and draped in the usual sterile fashion, and a sterile drape was applied covering the operative field. Maximum barrier sterile technique with sterile gowns and gloves were used for the procedure. A timeout was performed prior to the initiation of  the procedure. Local anesthesia was provided with 1% lidocaine with epinephrine. Ultrasound scanning of the right upper quadrant demonstrates a dilated gallbladder with minimal amount of pericholecystic fluid. Utilizing a transhepatic approach, a 22 gauge needle was advanced into the gallbladder under direct ultrasound guidance. An ultrasound image was saved for documentation purposes. Appropriate intraluminal puncture was confirmed with the efflux of bile and advancement of an 0.018 wire into the gallbladder lumen. The needle was exchanged for an [place] set. A small amount of aspirated bile was capped and sent to the laboratory for analysis. A small amount of contrast was injected to confirm appropriate intraluminal positioning. Over a Benson wire, a 70.2-French Cook cholecystomy tube was advanced into the gallbladder fossa, coiled and locked. Bile was aspirated and a small amount of contrast was injected as several post procedural spot radiographic images were obtained in various obliquities. The catheter was secured to the skin with suture, connected to a drainage bag and a dressing was placed. The patient tolerated the procedure  well without immediate post procedural complication. IMPRESSION: Successful ultrasound and fluoroscopic guided placement of a 10.2 French cholecystostomy tube. Small amount of aspirated bile was capped and sent to the laboratory for analysis. Electronically Signed   By: Sandi Mariscal M.D.   On: 03/23/2018 18:45   Dg Chest Portable 1 View  Result Date: 03/23/2018 CLINICAL DATA:  Nausea and vomiting for several days EXAM: PORTABLE CHEST 1 VIEW COMPARISON:  02/20/2018 FINDINGS: Cardiac shadow is at the upper limits of normal in size. Aortic calcifications are again seen. The lungs are hypoinflated. Mild increased retrocardiac density is noted likely related atelectasis. No sizable effusion is seen. No bony abnormality is noted. IMPRESSION: Mild left retrocardiac atelectasis. Electronically Signed   By: Inez Catalina M.D.   On: 03/23/2018 10:43    Anti-infectives: Anti-infectives (From admission, onward)   Start     Dose/Rate Route Frequency Ordered Stop   03/25/18 1000  fluconazole (DIFLUCAN) tablet 200 mg     200 mg Oral Daily 03/24/18 1257 03/31/18 0959   03/24/18 1400  cefTRIAXone (ROCEPHIN) 2 g in sodium chloride 0.9 % 100 mL IVPB     2 g 200 mL/hr over 30 Minutes Intravenous Every 24 hours 03/24/18 1202     03/24/18 1400  fluconazole (DIFLUCAN) tablet 400 mg     400 mg Oral Daily 03/24/18 1257 03/24/18 1723   03/24/18 0200  ceFEPIme (MAXIPIME) 2 g in sodium chloride 0.9 % 100 mL IVPB  Status:  Discontinued     2 g 200 mL/hr over 30 Minutes Intravenous Every 12 hours 03/23/18 1341 03/24/18 1153   03/23/18 1900  vancomycin (VANCOCIN) IVPB 750 mg/150 ml premix  Status:  Discontinued     750 mg 150 mL/hr over 60 Minutes Intravenous Every 12 hours 03/23/18 1341 03/24/18 1153   03/23/18 1245  ceFEPIme (MAXIPIME) 2 g in sodium chloride 0.9 % 100 mL IVPB     2 g 200 mL/hr over 30 Minutes Intravenous  Once 03/23/18 1242 03/23/18 1542   03/23/18 1245  metroNIDAZOLE (FLAGYL) IVPB 500 mg     500  mg 100 mL/hr over 60 Minutes Intravenous Every 8 hours 03/23/18 1242     03/23/18 1215  cefTRIAXone (ROCEPHIN) 2 g in sodium chloride 0.9 % 100 mL IVPB  Status:  Discontinued     2 g 200 mL/hr over 30 Minutes Intravenous Every 12 hours 03/23/18 1202 03/23/18 1242   03/23/18 1215  vancomycin (VANCOCIN) IVPB 1000 mg/200 mL premix  1,000 mg 200 mL/hr over 60 Minutes Intravenous  Once 03/23/18 1202 03/23/18 1349   03/23/18 1215  ampicillin (OMNIPEN) 2 g in sodium chloride 0.9 % 100 mL IVPB  Status:  Discontinued     2 g 300 mL/hr over 20 Minutes Intravenous  Once 03/23/18 1202 03/23/18 1242   03/23/18 1215  acyclovir (ZOVIRAX) 800 mg in dextrose 5 % 150 mL IVPB  Status:  Discontinued     10 mg/kg  80 kg 166 mL/hr over 60 Minutes Intravenous  Once 03/23/18 1202 03/23/18 1242   03/23/18 1215  cefTRIAXone (ROCEPHIN) 2 g in sodium chloride 0.9 % 100 mL IVPB  Status:  Discontinued     2 g 200 mL/hr over 30 Minutes Intravenous  Once 03/23/18 1204 03/23/18 1242      Assessment/Plan:  White count is improved to 11.  On IV antibiotics for acute cholecystitis.  Cholecystostomy tube in place.  Patient doing very well at this point he could go home soon on oral antibiotics with the drain in place and we could see him in 3 or 2 to 3 weeks to discuss further plans for either surgical intervention or removal of the drain at a later date.  Reviewed for he and his wife.  No surgical needs at this time will sign off  Florene Glen, MD, FACS  03/25/2018

## 2018-03-25 NOTE — Care Management Note (Signed)
Case Management Note  Patient Details  Name: [name] MRN: [id number] Date of Birth: [birth year]   Patient to discharge home today.  Patient is open with [facility] health.  PT has assessed patient and recommends home health PT.  PT and aide have been added to resumption order.  [place] with [facility] notified of discharge   Subjective/Objective:                    Action/Plan:   Expected Discharge Date:  03/25/18               Expected Discharge Plan:     In-House Referral:     Discharge planning Services     Post Acute Care Choice:    Choice offered to:     DME Arranged:    DME Agency:     HH Arranged:    HH Agency:     Status of Service:     If discussed at [employer], dates discussed:    Additional Comments:  Beverly Sessions, RN 03/25/2018, 1:34 PM

## 2018-03-25 NOTE — Progress Notes (Signed)
[place] for Warfarin Indication: atrial fibrillation  No Known Allergies  Patient Measurements: Height: 6' (182.9 cm) Weight: 170 lb (77.1 kg) IBW/kg (Calculated) : 77.6  Vital Signs: Temp: 97.9 F (36.6 C) (09/17 0418) Temp Source: Oral (09/17 0418) BP: 134/76 (09/17 0418) Pulse Rate: 58 (09/17 0418)  Labs: Recent Labs    03/23/18 1025 03/23/18 1215 03/24/18 0428 03/25/18 0719 03/25/18 0950  HGB 12.7*  --  10.3* 10.8*  --   HCT 36.3*  --  29.9* 30.8*  --   PLT 185  --  162 168  --   LABPROT 18.1*  --  17.3*  --  17.0*  INR 1.51  --  1.43  --  1.40  CREATININE  --  1.14 1.05  --   --     Estimated Creatinine Clearance: 51 mL/min (by C-G formula based on SCr of 1.05 mg/dL).   Medical History: Past Medical History:  Diagnosis Date  . Atrial fibrillation ([place])   . Cancer ([place])    PROSTATE  . Dysrhythmia    AFIB  . GERD (gastroesophageal reflux disease)   . Hypertension     Assessment: Patient is a 84yo male admitted for acute cholecystitis and had a cholecystomy tube placed by IR. Pharmacy consulted for warfarin dosing post IR. Patient's home dose is 3.5 mg daily but did not take for in 2-3 days PTA. There are 2 new DDIs introduced here: metronidazole and fluconazole, however, INR is not responding to lower doses and continues to decrease   INR Dose 9/15 1.51 3.5 mg + Flagyl 9/16 1.43 2 mg + Diflucan 9/17 1.40    Goal of Therapy:  INR 2-3   Plan:  Will attempt a single home dose of Warfarin 3.5 mg today. Daily INR checks to guide dosing.  Vallery Sa, PharmD 03/25/2018 11:21 AM

## 2018-03-25 NOTE — Evaluation (Signed)
Physical Therapy Evaluation Patient Details Name: [name] MRN: [id number] DOB: 07/01/1926 Today's Date: 03/25/2018   History of Present Illness  Pt is a pleasant 84 y.o male admitted for sepsis present on admission secondary to acute cholecystitis, status post cholecystostomy tube placement by IR 03/23/2018.   Clinical Impression  Pt ready to participate in PT with good effort and willingness to ambulate. Pt overall confident with basic bed mobility and transfers but requires increased time d/t general fatigue and weakness from recent hospital admission. Pt is functional for 100 feet of ambulation with RW, however demonstrated 1 instance of knee buckling that did not cause a LOB. Pt reports this happens frequently and does not typically limit his ability to ambulate. SpO2 during ambulation ranging between 90%-95% with pt reporting no change in symptoms. Pt would continue to benefit from skilled PT to improve strength, mobility, and safety to promote independence.     Follow Up Recommendations Home health PT    Equipment Recommendations  None recommended by PT    Recommendations for Other Services       Precautions / Restrictions Precautions Precautions: Fall Precaution Comments: pt reports wearing offloading boots on B feet for about 1 year whenever he walks Restrictions Weight Bearing Restrictions: No      Mobility  Bed Mobility Overal bed mobility: Needs Assistance Bed Mobility: Supine to Sit     Supine to sit: Supervision     General bed mobility comments: Pt showed good effort, heavy UE assistance and increased time required to perform transfer, no physical assist needed  Transfers Overall transfer level: Modified independent Equipment used: Rolling walker (2 wheeled)             General transfer comment: Pt able to stand upon first attempt, increased time required, VC for hand placement   Ambulation/Gait Ambulation/Gait assistance: Supervision Gait  Distance (Feet): 100 Feet Assistive device: Rolling walker (2 wheeled)       General Gait Details: Pt enthusiastic to walk, 1 rest break required to adjust walker, significant trunk flexion noted, SpO2 90%-95% during ambulation   Stairs            Wheelchair Mobility    Modified Rankin (Stroke Patients Only)       Balance Overall balance assessment: Modified Independent Sitting-balance support: Bilateral upper extremity supported Sitting balance-Leahy Scale: Fair Sitting balance - Comments: One instance of posterior lean requiring VC to maintain upright balance     Standing balance-Leahy Scale: Fair Standing balance comment: Unable to formally test, pt steady with reliance on walker                             Pertinent Vitals/Pain Pain Assessment: No/denies pain    Home Living Family/patient expects to be discharged to:: Private residence Living Arrangements: Spouse/significant other Available Help at Discharge: Family Type of Home: House Home Access: Stairs to enter;Ramped entrance     Home Layout: One level Home Equipment: Environmental consultant - 2 wheels;Cane - single point;Tub bench Additional Comments: Bathroom layout taken per OT evaluation    Prior Function Level of Independence: Needs assistance   Gait / Transfers Assistance Needed: Pt previously using a walker, able to do work in [employer], able to drive           Hand Dominance        Extremity/Trunk Assessment   Upper Extremity Assessment Upper Extremity Assessment: Overall WFL for tasks assessed  Lower Extremity Assessment Lower Extremity Assessment: Generalized weakness(muscular fatigue noted upon MMT of BLE, no pain noted)       Communication   Communication: No difficulties  Cognition Arousal/Alertness: Awake/alert Behavior During Therapy: WFL for tasks assessed/performed Overall Cognitive Status: Within Functional Limits for tasks assessed                                  General Comments: baseline cognitive status      General Comments      Exercises     Assessment/Plan    PT Assessment Patient needs continued PT services  PT Problem List Decreased strength;Decreased mobility;Decreased activity tolerance;Decreased balance;Decreased safety awareness       PT Treatment Interventions DME instruction;Functional mobility training;Balance training;Patient/family education;Neuromuscular re-education;Therapeutic activities;Gait training;Therapeutic exercise    PT Goals (Current goals can be found in the Care Plan section)  Acute Rehab PT Goals Patient Stated Goal: to return home and be independent  PT Goal Formulation: With patient Time For Goal Achievement: 04/08/18 Potential to Achieve Goals: Good    Frequency Min 2X/week   Barriers to discharge        Co-evaluation               AM-PAC PT "6 Clicks" Daily Activity  Outcome Measure Difficulty turning over in bed (including adjusting bedclothes, sheets and blankets)?: A Little Difficulty moving from lying on back to sitting on the side of the bed? : A Little Difficulty sitting down on and standing up from a chair with arms (e.g., wheelchair, bedside commode, etc,.)?: A Little Help needed moving to and from a bed to chair (including a wheelchair)?: A Little Help needed walking in hospital room?: A Little Help needed climbing 3-5 steps with a railing? : A Little 6 Click Score: 18    End of Session Equipment Utilized During Treatment: Gait belt Activity Tolerance: Patient tolerated treatment well Patient left: with call bell/phone within reach;with chair alarm set Nurse Communication: Mobility status PT Visit Diagnosis: Muscle weakness (generalized) (M62.81);Difficulty in walking, not elsewhere classified (R26.2)    Time: 0825-0903 PT Time Calculation (min) (ACUTE ONLY): 38 min   Charges:              Ernie Avena, SPT 03/25/2018, 10:46 AM

## 2018-03-25 NOTE — Discharge Summary (Signed)
[name] Vosler., is a 84 y.o. male  DOB 1925-07-12  MRN [id number].  Admission date:  03/23/2018  Admitting Physician  Sela Hua, MD  Discharge Date:  03/25/2018   Primary MD  Adin Hector, MD  Recommendations for primary care physician for things to follow:   Up with PCP in 1 week Follow-up with Dr. Burt Knack in 2 weeks.   Admission Diagnosis  Acute cholecystitis [K81.0] Cholecystitis [K81.9] Sepsis, due to unspecified organism [facility]) [A41.9]   Discharge Diagnosis  Acute cholecystitis [K81.0] Cholecystitis [K81.9] Sepsis, due to unspecified organism [facility]) [A41.9]    Active Problems:   Acute cholecystitis      Past Medical History:  Diagnosis Date  . Atrial fibrillation ([place])   . Cancer ([place])    PROSTATE  . Dysrhythmia    AFIB  . GERD (gastroesophageal reflux disease)   . Hypertension     Past Surgical History:  Procedure Laterality Date  . CAROTID ENDARTERECTOMY Left   . CATARACT EXTRACTION W/PHACO Right 10/13/2015   Procedure: CATARACT EXTRACTION PHACO AND INTRAOCULAR LENS PLACEMENT (IOC);  Surgeon: Leandrew Koyanagi, MD;  Location: [facility] ORS;  Service: Ophthalmology;  Laterality: Right;  Lot # [id number] H US:01:23.3 AP:17.2 CDE:14.31  . CATARACT EXTRACTION W/PHACO Left 12/12/2015   Procedure: CATARACT EXTRACTION PHACO AND INTRAOCULAR LENS PLACEMENT (IOC);  Surgeon: Leandrew Koyanagi, MD;  Location: [facility] ORS;  Service: Ophthalmology;  Laterality: Left;  [place] 1.10 AP% 15.9 CDE 11.22 FLUID PACK LOT # [id number]  . IR PERC CHOLECYSTOSTOMY  03/23/2018  . JOINT REPLACEMENT Right    TKR       History of present illness and  Hospital Course:     Kindly see H&P for history of present illness and admission details, please review complete Labs, Consult reports and Test reports for all details in  brief  HPI  from the history and physical done on the day of admission  84 year old male patient admitted for sepsis, patient had fever, nausea and vomiting at home brought by the wife.  Found to have sepsis, acute cholecystitis.  Hospital Course  1. sepsis present on admission secondary to acute cholecystitis, admitted to medical unit started on aggressive hydration, IV antibiotics, patient is seen by IR in the emergency room had a emergency cholecystostomy drain to help with symptoms, seen by surgery Dr. Burt Knack p CT scan abdomen on admission showed acute cholecystitis.  Patient not a surgical candidate because he was on Coumadin so emergency cholecystostomy drain placed by IR on admission.  After that patient patient's nausea improved, no further fever.  He is tolerating the diet.  Seen by Dr. Burt Knack today, patient stable for discharge home with home health physical therapy, nurse.  Patient will go home with cholecystostomy tube.  He needs care for that/she needs to follow-up with Dr. Burt Knack in 2 weeks regarding cholecystostomy tube, removal, and also possible cholecystectomy Sepsis present on admission, resolving, WBC decreased to 11.6 from 20.7 on admission.  Elevated lactic acid of 2.5 on admission.  Intraoperative culture showed yeast.  Started on Diflucan.  Patient needs Diflucan for 7 days, Augmentin for 7 days and Flagyl for 7 days.  Antibiotics as per discharge instructions.  Discussed with  patient and patient's wife.  #2 .deconditioning: Physical therapy recommended home health physical therapy. 3.  Paroxysmal  atrial fibrillation: Patient is on Coumadin. 4.  Hyperlipidemia: Continue statins 5.  Nonhealing pressure ulcer to the left heel, seen by wound care nurse. 6.  Acute renal failure:  Improved with fluids.     Discharge Condition: Stable   Follow UP  Follow-up Information    [name], MD. Schedule an appointment as soon as possible for a visit in 1 week(s).    Specialty:  Internal Medicine Contact information: [place] [address] [phone]        Florene Glen, MD. Schedule an appointment as soon as possible for a visit in 2 week(s).   Specialty:  General Surgery Contact information: [address] [place] [address] [phone]             Discharge Instructions  and  Discharge Medications      Allergies as of 03/25/2018   No Known Allergies     Medication List    STOP taking these medications   bisacodyl 5 MG EC tablet Commonly known as:  DULCOLAX   HYDROcodone-acetaminophen 5-325 MG tablet Commonly known as:  NORCO/VICODIN     TAKE these medications   amLODipine 5 MG tablet Commonly known as:  NORVASC Take 5 mg by mouth daily.   amoxicillin-clavulanate 875-125 MG tablet Commonly known as:  AUGMENTIN Take 1 tablet by mouth 2 (two) times daily for 7 days.   fluconazole 200 MG tablet Commonly known as:  DIFLUCAN Take 1 tablet (200 mg total) by mouth daily.   lovastatin 40 MG tablet Commonly known as:  MEVACOR Take 80 mg by mouth at bedtime.   methocarbamol 500 MG tablet Commonly known as:  ROBAXIN Take 1 tablet (500 mg total) by mouth every 6 (six) hours as needed for muscle spasms.   metroNIDAZOLE 500 MG tablet Commonly known as:  FLAGYL Take 1 tablet (500 mg total) by mouth 3 (three) times daily for 7 days.   multivitamin capsule Take 1 capsule by mouth daily.   pantoprazole 40 MG tablet Commonly known as:  PROTONIX TAKE 1 TABLET BY MOUTH EVERY DAY   terazosin 2 MG capsule Commonly known as:  HYTRIN Take 2 mg by mouth at bedtime.   vitamin B-12 1000 MCG tablet Commonly known as:  CYANOCOBALAMIN Take 1,000 mcg by mouth daily.   Vitamin D-3 1000 units Caps Take 1,000 Units by mouth daily.   warfarin 2.5 MG tablet Commonly known as:  COUMADIN Take 2.5 mg by mouth daily.   warfarin 1 MG tablet Commonly known as:   COUMADIN Take 1 mg by mouth daily.         Diet and Activity recommendation: See Discharge Instructions above   Consults obtained -surgery   Major procedures and Radiology Reports - PLEASE review detailed and final reports for all details, in brief -      Ct Chest W Contrast  Result Date: 03/23/2018 CLINICAL DATA:  Nausea, vomiting, fever and cough. Shortness of breath. EXAM: CT CHEST, ABDOMEN, AND PELVIS WITH CONTRAST TECHNIQUE: Multidetector CT imaging of the chest, abdomen and pelvis was performed following the standard protocol during bolus administration of intravenous contrast. CONTRAST:  188mL ISOVUE-300 IOPAMIDOL (ISOVUE-300) INJECTION 61% COMPARISON:  Chest CT angiogram dated 07/15/2017. CT abdomen dated 04/07/2012. FINDINGS: CT CHEST FINDINGS Cardiovascular: Heart size is upper normal. No pericardial effusion. No thoracic aortic aneurysm or evidence of aortic dissection. Aortic atherosclerosis. Diffuse coronary artery calcifications. No central obstructing pulmonary embolism seen. Mediastinum/Nodes: No mass or enlarged lymph nodes within the mediastinum or perihilar regions. Mildly prominent lymph nodes again noted within the RIGHT paratracheal space and subcarinal space. Esophagus appears normal. Trachea and central  bronchi are unremarkable. Lungs/Pleura: Mild bibasilar atelectasis, most likely chronic. No new lung findings. No evidence of pneumonia. No pleural effusion or pneumothorax. Musculoskeletal: No acute or suspicious osseous finding. Degenerative changes throughout the slightly scoliotic thoracic spine, mild to moderate in degree. CT ABDOMEN PELVIS FINDINGS Hepatobiliary: Gallbladder is distended. Gallbladder walls are thickened and there is pericholecystic inflammation. Multiple stones in the gallbladder neck region, including 1 small stone that is likely in the cystic duct. No focal liver abnormality.  No bile duct dilatation. Pancreas: Unremarkable. No pancreatic ductal  dilatation or surrounding inflammatory changes. Spleen: Normal in size without focal abnormality. Adrenals/Urinary Tract: Adrenal glands appear normal. Kidneys are unremarkable without suspicious mass, stone or hydronephrosis. No perinephric inflammation. No ureteral or bladder calculi identified. Bladder appears normal. Stomach/Bowel: No dilated large or small bowel loops. No evidence of bowel wall inflammation. Incidental note is made of cecal displacement to the RIGHT upper quadrant. Extensive diverticulosis of the sigmoid and descending colon but no focal inflammatory change to suggest acute diverticulitis. Stomach appears normal, decompressed. Vascular/Lymphatic: Aortic atherosclerosis. No enlarged abdominal or pelvic lymph nodes. Reproductive: Prostate is unremarkable. Other: No abscess collection.  No free intraperitoneal air. Musculoskeletal: No acute or suspicious osseous finding. Degenerative spondylitic changes throughout the lumbar spine, moderate to severe in degree. Old healed fractures of the bilateral pubic rami. IMPRESSION: 1. Findings are consistent with ACUTE CHOLECYSTITIS. Multiple gallstones in the gallbladder neck region, including 1 small stone that is likely obstructing within the cystic duct. 2. No other acute findings within the chest, abdomen or pelvis. 3. Colonic diverticulosis without evidence of acute diverticulitis. 4.  Aortic Atherosclerosis (ICD10-I70.0). 5. Additional chronic/incidental findings detailed above. Electronically Signed   By: Franki Cabot M.D.   On: 03/23/2018 13:29   [place] Intraoperative  Result Date: 03/23/2018 INDICATION: Acute cholecystitis, poor surgical candidate. Request made for placement of image guided cholecystostomy tube for infection source control purposes. EXAM: ULTRASOUND AND FLUOROSCOPIC-GUIDED CHOLECYSTOSTOMY TUBE PLACEMENT COMPARISON:  CT abdomen and pelvis - earlier same day MEDICATIONS: The patient is currently admitted to the hospital and on  intravenous antibiotics. Antibiotics were administered within an appropriate time frame prior to skin puncture. ANESTHESIA/SEDATION: Moderate (conscious) sedation was employed during this procedure. A total of Versed 1 mg and Fentanyl 50 mcg was administered intravenously. Moderate Sedation Time: 11 minutes. The patient's level of consciousness and vital signs were monitored continuously by radiology nursing throughout the procedure under my direct supervision. CONTRAST:  14mL ISOVUE-300 IOPAMIDOL (ISOVUE-300) INJECTION 61% - administered into the gallbladder fossa. FLUOROSCOPY TIME:  42 seconds (15 mGy) COMPLICATIONS: None immediate. PROCEDURE: Informed written consent was obtained from the patient after a discussion of the risks, benefits and alternatives to treatment. Questions regarding the procedure were encouraged and answered. A timeout was performed prior to the initiation of the procedure. The right upper abdominal quadrant was prepped and draped in the usual sterile fashion, and a sterile drape was applied covering the operative field. Maximum barrier sterile technique with sterile gowns and gloves were used for the procedure. A timeout was performed prior to the initiation of the procedure. Local anesthesia was provided with 1% lidocaine with epinephrine. Ultrasound scanning of the right upper quadrant demonstrates a dilated gallbladder with minimal amount of pericholecystic fluid. Utilizing a transhepatic approach, a 22 gauge needle was advanced into the gallbladder under direct ultrasound guidance. An ultrasound image was saved for documentation purposes. Appropriate intraluminal puncture was confirmed with the efflux of bile and advancement of an 0.018 wire into the  gallbladder lumen. The needle was exchanged for an [place] set. A small amount of aspirated bile was capped and sent to the laboratory for analysis. A small amount of contrast was injected to confirm appropriate intraluminal positioning.  Over a Benson wire, a 44.2-French Cook cholecystomy tube was advanced into the gallbladder fossa, coiled and locked. Bile was aspirated and a small amount of contrast was injected as several post procedural spot radiographic images were obtained in various obliquities. The catheter was secured to the skin with suture, connected to a drainage bag and a dressing was placed. The patient tolerated the procedure well without immediate post procedural complication. IMPRESSION: Successful ultrasound and fluoroscopic guided placement of a 10.2 French cholecystostomy tube. Small amount of aspirated bile was capped and sent to the laboratory for analysis. Electronically Signed   By: Sandi Mariscal M.D.   On: 03/23/2018 18:45   Ct Abdomen Pelvis W Contrast  Result Date: 03/23/2018 CLINICAL DATA:  Nausea, vomiting, fever and cough. Shortness of breath. EXAM: CT CHEST, ABDOMEN, AND PELVIS WITH CONTRAST TECHNIQUE: Multidetector CT imaging of the chest, abdomen and pelvis was performed following the standard protocol during bolus administration of intravenous contrast. CONTRAST:  129mL ISOVUE-300 IOPAMIDOL (ISOVUE-300) INJECTION 61% COMPARISON:  Chest CT angiogram dated 07/15/2017. CT abdomen dated 04/07/2012. FINDINGS: CT CHEST FINDINGS Cardiovascular: Heart size is upper normal. No pericardial effusion. No thoracic aortic aneurysm or evidence of aortic dissection. Aortic atherosclerosis. Diffuse coronary artery calcifications. No central obstructing pulmonary embolism seen. Mediastinum/Nodes: No mass or enlarged lymph nodes within the mediastinum or perihilar regions. Mildly prominent lymph nodes again noted within the RIGHT paratracheal space and subcarinal space. Esophagus appears normal. Trachea and central bronchi are unremarkable. Lungs/Pleura: Mild bibasilar atelectasis, most likely chronic. No new lung findings. No evidence of pneumonia. No pleural effusion or pneumothorax. Musculoskeletal: No acute or suspicious osseous  finding. Degenerative changes throughout the slightly scoliotic thoracic spine, mild to moderate in degree. CT ABDOMEN PELVIS FINDINGS Hepatobiliary: Gallbladder is distended. Gallbladder walls are thickened and there is pericholecystic inflammation. Multiple stones in the gallbladder neck region, including 1 small stone that is likely in the cystic duct. No focal liver abnormality.  No bile duct dilatation. Pancreas: Unremarkable. No pancreatic ductal dilatation or surrounding inflammatory changes. Spleen: Normal in size without focal abnormality. Adrenals/Urinary Tract: Adrenal glands appear normal. Kidneys are unremarkable without suspicious mass, stone or hydronephrosis. No perinephric inflammation. No ureteral or bladder calculi identified. Bladder appears normal. Stomach/Bowel: No dilated large or small bowel loops. No evidence of bowel wall inflammation. Incidental note is made of cecal displacement to the RIGHT upper quadrant. Extensive diverticulosis of the sigmoid and descending colon but no focal inflammatory change to suggest acute diverticulitis. Stomach appears normal, decompressed. Vascular/Lymphatic: Aortic atherosclerosis. No enlarged abdominal or pelvic lymph nodes. Reproductive: Prostate is unremarkable. Other: No abscess collection.  No free intraperitoneal air. Musculoskeletal: No acute or suspicious osseous finding. Degenerative spondylitic changes throughout the lumbar spine, moderate to severe in degree. Old healed fractures of the bilateral pubic rami. IMPRESSION: 1. Findings are consistent with ACUTE CHOLECYSTITIS. Multiple gallstones in the gallbladder neck region, including 1 small stone that is likely obstructing within the cystic duct. 2. No other acute findings within the chest, abdomen or pelvis. 3. Colonic diverticulosis without evidence of acute diverticulitis. 4.  Aortic Atherosclerosis (ICD10-I70.0). 5. Additional chronic/incidental findings detailed above. Electronically Signed    By: Franki Cabot M.D.   On: 03/23/2018 13:29   Ir Perc Cholecystostomy  Result Date: 03/23/2018 INDICATION: Acute  cholecystitis, poor surgical candidate. Request made for placement of image guided cholecystostomy tube for infection source control purposes. EXAM: ULTRASOUND AND FLUOROSCOPIC-GUIDED CHOLECYSTOSTOMY TUBE PLACEMENT COMPARISON:  CT abdomen and pelvis - earlier same day MEDICATIONS: The patient is currently admitted to the hospital and on intravenous antibiotics. Antibiotics were administered within an appropriate time frame prior to skin puncture. ANESTHESIA/SEDATION: Moderate (conscious) sedation was employed during this procedure. A total of Versed 1 mg and Fentanyl 50 mcg was administered intravenously. Moderate Sedation Time: 11 minutes. The patient's level of consciousness and vital signs were monitored continuously by radiology nursing throughout the procedure under my direct supervision. CONTRAST:  64mL ISOVUE-300 IOPAMIDOL (ISOVUE-300) INJECTION 61% - administered into the gallbladder fossa. FLUOROSCOPY TIME:  42 seconds (15 mGy) COMPLICATIONS: None immediate. PROCEDURE: Informed written consent was obtained from the patient after a discussion of the risks, benefits and alternatives to treatment. Questions regarding the procedure were encouraged and answered. A timeout was performed prior to the initiation of the procedure. The right upper abdominal quadrant was prepped and draped in the usual sterile fashion, and a sterile drape was applied covering the operative field. Maximum barrier sterile technique with sterile gowns and gloves were used for the procedure. A timeout was performed prior to the initiation of the procedure. Local anesthesia was provided with 1% lidocaine with epinephrine. Ultrasound scanning of the right upper quadrant demonstrates a dilated gallbladder with minimal amount of pericholecystic fluid. Utilizing a transhepatic approach, a 22 gauge needle was advanced into the  gallbladder under direct ultrasound guidance. An ultrasound image was saved for documentation purposes. Appropriate intraluminal puncture was confirmed with the efflux of bile and advancement of an 0.018 wire into the gallbladder lumen. The needle was exchanged for an [place] set. A small amount of aspirated bile was capped and sent to the laboratory for analysis. A small amount of contrast was injected to confirm appropriate intraluminal positioning. Over a Benson wire, a 61.2-French Cook cholecystomy tube was advanced into the gallbladder fossa, coiled and locked. Bile was aspirated and a small amount of contrast was injected as several post procedural spot radiographic images were obtained in various obliquities. The catheter was secured to the skin with suture, connected to a drainage bag and a dressing was placed. The patient tolerated the procedure well without immediate post procedural complication. IMPRESSION: Successful ultrasound and fluoroscopic guided placement of a 10.2 French cholecystostomy tube. Small amount of aspirated bile was capped and sent to the laboratory for analysis. Electronically Signed   By: Sandi Mariscal M.D.   On: 03/23/2018 18:45   Dg Chest Portable 1 View  Result Date: 03/23/2018 CLINICAL DATA:  Nausea and vomiting for several days EXAM: PORTABLE CHEST 1 VIEW COMPARISON:  02/20/2018 FINDINGS: Cardiac shadow is at the upper limits of normal in size. Aortic calcifications are again seen. The lungs are hypoinflated. Mild increased retrocardiac density is noted likely related atelectasis. No sizable effusion is seen. No bony abnormality is noted. IMPRESSION: Mild left retrocardiac atelectasis. Electronically Signed   By: Inez Catalina M.D.   On: 03/23/2018 10:43    Micro Results     Recent Results (from the past 240 hour(s))  Urine culture     Status: None   Collection Time: 03/23/18 10:25 AM  Result Value Ref Range Status   Specimen Description   Final    URINE,  RANDOM Performed at [facility], [address] [place], [place] [address]    Special Requests   Final  NONE Performed at [facility], [address] [place], [place] [address]    Culture   Final    NO GROWTH Performed at [facility], [place] [address], [place], [place] [address]    Report Status 03/24/2018 FINAL  Final  Body fluid culture     Status: None (Preliminary result)   Collection Time: 03/23/18  6:36 PM  Result Value Ref Range Status   Specimen Description   Final    GALL BLADDER Performed at [facility], [address], [place], [place] [address]    Special Requests   Final    NONE Performed at [facility], [address] [place], [place] [address]    Gram Stain   Final    RARE WBC PRESENT, PREDOMINANTLY PMN MODERATE YEAST Performed at [facility], [place] [address], [place], [place] [address]    Culture MODERATE YEAST  Final   Report Status PENDING  Incomplete       Today   Subjective:   [name] today has no headache,no chest abdominal pain,no new weakness tingling or numbness, feels much better wants to go home today.   Objective:   Blood pressure 134/76, pulse (!) 58, temperature 97.9 F (36.6 C), temperature source Oral, resp. rate 20, height 6' (1.829 m), weight 77.1 kg, SpO2 96 %.   Intake/Output Summary (Last 24 hours) at 03/25/2018 1106 Last data filed at 03/25/2018 1013 Gross per 24 hour  Intake 232.43 ml  Output 620 ml  Net -387.57 ml    Exam Awake Alert, Oriented x 3, No new F.N deficits, Normal affect .AT,PERRAL Supple Neck,No JVD, No cervical lymphadenopathy appriciated.  Symmetrical Chest wall movement, Good air movement bilaterally, CTAB RRR,No Gallops,Rubs or new Murmurs, No Parasternal Heave +ve B.Sounds, Abd Soft, Non tender, No organomegaly appriciated, No rebound -guarding or rigidity.  Patient has right upper quadrant cholecystostomy tube present No Cyanosis,  Clubbing or edema, No new Rash or bruise  Data Review   CBC w Diff:  Lab Results  Component Value Date   WBC 11.6 (H) 03/25/2018   HGB 10.8 (L) 03/25/2018   HGB 11.6 (L) 03/28/2014   HCT 30.8 (L) 03/25/2018   HCT 34.3 (L) 03/28/2014   PLT 168 03/25/2018   PLT 171 03/28/2014   LYMPHOPCT 4 03/23/2018   LYMPHOPCT 18.4 04/09/2012   MONOPCT 4 03/23/2018   MONOPCT 8.5 04/09/2012   EOSPCT 0 03/23/2018   EOSPCT 1.3 04/09/2012   BASOPCT 0 03/23/2018   BASOPCT 0.4 04/09/2012    CMP:  Lab Results  Component Value Date   NA 137 03/24/2018   NA 140 03/28/2014   K 3.8 03/24/2018   K 4.0 03/28/2014   CL 110 03/24/2018   CL 109 (H) 03/28/2014   CO2 21 (L) 03/24/2018   CO2 24 03/28/2014   BUN 31 (H) 03/24/2018   BUN 15 03/28/2014   CREATININE 1.05 03/24/2018   CREATININE 1.41 (H) 03/28/2014   PROT 6.0 (L) 03/24/2018   PROT 7.0 03/28/2014   ALBUMIN 2.9 (L) 03/24/2018   ALBUMIN 4.0 03/28/2014   BILITOT 0.6 03/24/2018   BILITOT 0.4 03/28/2014   ALKPHOS 44 03/24/2018   ALKPHOS 74 03/28/2014   AST 20 03/24/2018   AST 20 03/28/2014   ALT 13 03/24/2018   ALT 19 03/28/2014  .   Total Time in preparing paper work, data evaluation and todays exam - 18 minutes  [name] M.D on 03/25/2018 at 11:06 AM  Note: This dictation was prepared with Dragon dictation along with smaller phrase technology. Any transcriptional errors that result from this process are unintentional.

## 2018-03-25 NOTE — Progress Notes (Signed)
Pt. wife was educated on drainage catheter flushing and emptying. Wife verbalized understanding.

## 2018-03-27 LAB — BODY FLUID CULTURE

## 2018-04-01 ENCOUNTER — Ambulatory Visit: Payer: Medicare Other | Admitting: Physician Assistant

## 2018-04-01 DIAGNOSIS — R5383 Other fatigue: Secondary | ICD-10-CM | POA: Diagnosis not present

## 2018-04-01 DIAGNOSIS — I959 Hypotension, unspecified: Secondary | ICD-10-CM | POA: Diagnosis not present

## 2018-04-01 DIAGNOSIS — K810 Acute cholecystitis: Secondary | ICD-10-CM | POA: Diagnosis not present

## 2018-04-03 ENCOUNTER — Emergency Department: Payer: PPO

## 2018-04-03 ENCOUNTER — Other Ambulatory Visit: Payer: Self-pay

## 2018-04-03 ENCOUNTER — Encounter: Payer: Self-pay | Admitting: Emergency Medicine

## 2018-04-03 ENCOUNTER — Observation Stay
Admission: EM | Admit: 2018-04-03 | Discharge: 2018-04-05 | Disposition: A | Discharge status: Home / Self Care | Payer: PPO | Attending: Internal Medicine | Admitting: Internal Medicine

## 2018-04-03 DIAGNOSIS — I480 Paroxysmal atrial fibrillation: Secondary | ICD-10-CM | POA: Diagnosis not present

## 2018-04-03 DIAGNOSIS — Z7901 Long term (current) use of anticoagulants: Secondary | ICD-10-CM | POA: Diagnosis not present

## 2018-04-03 DIAGNOSIS — Z66 Do not resuscitate: Secondary | ICD-10-CM | POA: Insufficient documentation

## 2018-04-03 DIAGNOSIS — M6281 Muscle weakness (generalized): Secondary | ICD-10-CM | POA: Diagnosis not present

## 2018-04-03 DIAGNOSIS — R531 Weakness: Principal | ICD-10-CM | POA: Insufficient documentation

## 2018-04-03 DIAGNOSIS — K5730 Diverticulosis of large intestine without perforation or abscess without bleeding: Secondary | ICD-10-CM | POA: Insufficient documentation

## 2018-04-03 DIAGNOSIS — I119 Hypertensive heart disease without heart failure: Secondary | ICD-10-CM | POA: Diagnosis not present

## 2018-04-03 DIAGNOSIS — Z8546 Personal history of malignant neoplasm of prostate: Secondary | ICD-10-CM | POA: Diagnosis not present

## 2018-04-03 DIAGNOSIS — K219 Gastro-esophageal reflux disease without esophagitis: Secondary | ICD-10-CM | POA: Insufficient documentation

## 2018-04-03 DIAGNOSIS — Z79899 Other long term (current) drug therapy: Secondary | ICD-10-CM | POA: Diagnosis not present

## 2018-04-03 DIAGNOSIS — E785 Hyperlipidemia, unspecified: Secondary | ICD-10-CM | POA: Diagnosis not present

## 2018-04-03 DIAGNOSIS — I700 Atherosclerosis of aorta: Secondary | ICD-10-CM | POA: Insufficient documentation

## 2018-04-03 DIAGNOSIS — Z87891 Personal history of nicotine dependence: Secondary | ICD-10-CM | POA: Insufficient documentation

## 2018-04-03 DIAGNOSIS — D72829 Elevated white blood cell count, unspecified: Secondary | ICD-10-CM

## 2018-04-03 DIAGNOSIS — J9811 Atelectasis: Secondary | ICD-10-CM | POA: Diagnosis not present

## 2018-04-03 DIAGNOSIS — D689 Coagulation defect, unspecified: Secondary | ICD-10-CM | POA: Diagnosis not present

## 2018-04-03 DIAGNOSIS — T45515A Adverse effect of anticoagulants, initial encounter: Secondary | ICD-10-CM | POA: Diagnosis not present

## 2018-04-03 DIAGNOSIS — R4182 Altered mental status, unspecified: Secondary | ICD-10-CM | POA: Insufficient documentation

## 2018-04-03 DIAGNOSIS — I4891 Unspecified atrial fibrillation: Secondary | ICD-10-CM | POA: Diagnosis not present

## 2018-04-03 DIAGNOSIS — R6510 Systemic inflammatory response syndrome (SIRS) of non-infectious origin without acute organ dysfunction: Secondary | ICD-10-CM | POA: Diagnosis not present

## 2018-04-03 DIAGNOSIS — R791 Abnormal coagulation profile: Secondary | ICD-10-CM | POA: Diagnosis not present

## 2018-04-03 DIAGNOSIS — L8990 Pressure ulcer of unspecified site, unspecified stage: Secondary | ICD-10-CM

## 2018-04-03 DIAGNOSIS — Z8249 Family history of ischemic heart disease and other diseases of the circulatory system: Secondary | ICD-10-CM | POA: Insufficient documentation

## 2018-04-03 LAB — URINALYSIS, COMPLETE (UACMP) WITH MICROSCOPIC
BACTERIA UA: NONE SEEN
Glucose, UA: NEGATIVE mg/dL
Hgb urine dipstick: NEGATIVE
KETONES UR: NEGATIVE mg/dL
Nitrite: NEGATIVE
PH: 5 (ref 5.0–8.0)
Protein, ur: 30 mg/dL — AB
SQUAMOUS EPITHELIAL / LPF: NONE SEEN (ref 0–5)
Specific Gravity, Urine: 1.028 (ref 1.005–1.030)

## 2018-04-03 LAB — COMPREHENSIVE METABOLIC PANEL
ALT: 12 U/L (ref 0–44)
AST: 22 U/L (ref 15–41)
Albumin: 3.4 g/dL — ABNORMAL LOW (ref 3.5–5.0)
Alkaline Phosphatase: 51 U/L (ref 38–126)
Anion gap: 11 (ref 5–15)
BUN: 17 mg/dL (ref 8–23)
CHLORIDE: 101 mmol/L (ref 98–111)
CO2: 23 mmol/L (ref 22–32)
CREATININE: 1.19 mg/dL (ref 0.61–1.24)
Calcium: 8.6 mg/dL — ABNORMAL LOW (ref 8.9–10.3)
GFR calc Af Amer: >60 mL/min (ref >60)
GFR, EST NON AFRICAN AMERICAN: 52 mL/min — AB (ref >60)
Glucose, Bld: 114 mg/dL — ABNORMAL HIGH (ref 70–99)
Potassium: 3.7 mmol/L (ref 3.5–5.1)
Sodium: 135 mmol/L (ref 135–145)
Total Bilirubin: 0.6 mg/dL (ref 0.3–1.2)
Total Protein: 7.2 g/dL (ref 6.5–8.1)

## 2018-04-03 LAB — CBC WITH DIFFERENTIAL/PLATELET
Basophils Absolute: 0.1 10*3/uL (ref 0–0.1)
Basophils Relative: 0 %
Eosinophils Absolute: 0.1 10*3/uL (ref 0–0.7)
Eosinophils Relative: 0 %
HEMATOCRIT: 33.3 % — AB (ref 40.0–52.0)
HEMOGLOBIN: 11.5 g/dL — AB (ref 13.0–18.0)
LYMPHS ABS: 0.5 10*3/uL — AB (ref 1.0–3.6)
Lymphocytes Relative: 3 %
MCH: 33.1 pg (ref 26.0–34.0)
MCHC: 34.5 g/dL (ref 32.0–36.0)
MCV: 96 fL (ref 80.0–100.0)
MONOS PCT: 3 %
Monocytes Absolute: 0.4 10*3/uL (ref 0.2–1.0)
NEUTROS ABS: 15.8 10*3/uL — AB (ref 1.4–6.5)
Neutrophils Relative %: 94 %
Platelets: 298 10*3/uL (ref 150–440)
RBC: 3.46 MIL/uL — ABNORMAL LOW (ref 4.40–5.90)
RDW: 15.8 % — ABNORMAL HIGH (ref 11.5–14.5)
WBC: 16.8 10*3/uL — ABNORMAL HIGH (ref 3.8–10.6)

## 2018-04-03 LAB — BRAIN NATRIURETIC PEPTIDE: B Natriuretic Peptide: 354 pg/mL — ABNORMAL HIGH (ref 0.0–100.0)

## 2018-04-03 LAB — LACTIC ACID, PLASMA: Lactic Acid, Venous: 1.6 mmol/L (ref 0.5–1.9)

## 2018-04-03 LAB — TROPONIN I: Troponin I: <0.03 ng/mL (ref <0.03)

## 2018-04-03 LAB — PROTIME-INR
INR: 5.87
Prothrombin Time: 52.2 seconds — ABNORMAL HIGH (ref 11.4–15.2)

## 2018-04-03 MED ORDER — METHOCARBAMOL 500 MG PO TABS
500.0000 mg | ORAL_TABLET | Freq: Four times a day (QID) | ORAL | Status: DC | PRN
  Filled 2018-04-03: qty 1

## 2018-04-03 MED ORDER — SODIUM CHLORIDE 0.9% FLUSH: 3.0000 mL | INTRAVENOUS | Status: DC | PRN

## 2018-04-03 MED ORDER — SODIUM CHLORIDE 0.9 % IV SOLN
Freq: Once | INTRAVENOUS | Status: AC
  Administered 2018-04-03: 15:00:00 via INTRAVENOUS

## 2018-04-03 MED ORDER — TERAZOSIN HCL 2 MG PO CAPS
2.0000 mg | ORAL_CAPSULE | Freq: Every day | ORAL | Status: DC
  Administered 2018-04-03 – 2018-04-04 (×2): 2 mg via ORAL
  Filled 2018-04-03 (×3): qty 1

## 2018-04-03 MED ORDER — SENNOSIDES-DOCUSATE SODIUM 8.6-50 MG PO TABS: 1.0000 | ORAL_TABLET | Freq: Every evening | ORAL | Status: DC | PRN

## 2018-04-03 MED ORDER — ONDANSETRON HCL 4 MG/2ML IJ SOLN: 4.0000 mg | Freq: Four times a day (QID) | INTRAMUSCULAR | Status: DC | PRN

## 2018-04-03 MED ORDER — PRAVASTATIN SODIUM 20 MG PO TABS
10.0000 mg | ORAL_TABLET | Freq: Every day | ORAL | Status: DC
  Administered 2018-04-04: 18:00:00 10 mg via ORAL
  Filled 2018-04-03: qty 1

## 2018-04-03 MED ORDER — ACETAMINOPHEN 325 MG PO TABS: 650.0000 mg | ORAL_TABLET | Freq: Four times a day (QID) | ORAL | Status: DC | PRN

## 2018-04-03 MED ORDER — ACETAMINOPHEN 650 MG RE SUPP: 650.0000 mg | Freq: Four times a day (QID) | RECTAL | Status: DC | PRN

## 2018-04-03 MED ORDER — IOPAMIDOL (ISOVUE-300) INJECTION 61%
100.0000 mL | Freq: Once | INTRAVENOUS | Status: AC | PRN
  Administered 2018-04-03: 100 mL via INTRAVENOUS

## 2018-04-03 MED ORDER — SODIUM CHLORIDE 0.9 % IV SOLN
1.0000 g | Freq: Once | INTRAVENOUS | Status: AC
  Administered 2018-04-03: 1 g via INTRAVENOUS
  Filled 2018-04-03: qty 1

## 2018-04-03 MED ORDER — AMLODIPINE BESYLATE 5 MG PO TABS
2.5000 mg | ORAL_TABLET | Freq: Every day | ORAL | Status: DC
  Administered 2018-04-03 – 2018-04-05 (×3): 2.5 mg via ORAL
  Filled 2018-04-03 (×3): qty 1

## 2018-04-03 MED ORDER — VITAMIN D3 25 MCG (1000 UNIT) PO TABS
1000.0000 [IU] | ORAL_TABLET | Freq: Every day | ORAL | Status: DC
  Administered 2018-04-04 – 2018-04-05 (×2): 1000 [IU] via ORAL
  Filled 2018-04-03 (×4): qty 1

## 2018-04-03 MED ORDER — IOPAMIDOL (ISOVUE-300) INJECTION 61%
15.0000 mL | INTRAVENOUS | Status: AC
  Administered 2018-04-03 (×2): 15 mL via ORAL

## 2018-04-03 MED ORDER — VITAMIN B-12 1000 MCG PO TABS
1000.0000 ug | ORAL_TABLET | Freq: Every day | ORAL | Status: DC
  Administered 2018-04-04 – 2018-04-05 (×2): 1000 ug via ORAL
  Filled 2018-04-03 (×2): qty 1

## 2018-04-03 MED ORDER — ADULT MULTIVITAMIN W/MINERALS CH
1.0000 | ORAL_TABLET | Freq: Every day | ORAL | Status: DC
  Administered 2018-04-04 – 2018-04-05 (×2): 1 via ORAL
  Filled 2018-04-03 (×2): qty 1

## 2018-04-03 MED ORDER — SODIUM CHLORIDE 0.9% FLUSH
3.0000 mL | Freq: Two times a day (BID) | INTRAVENOUS | Status: DC
  Administered 2018-04-03 – 2018-04-04 (×3): 3 mL via INTRAVENOUS

## 2018-04-03 MED ORDER — ONDANSETRON HCL 4 MG PO TABS: 4.0000 mg | ORAL_TABLET | Freq: Four times a day (QID) | ORAL | Status: DC | PRN

## 2018-04-03 MED ORDER — PANTOPRAZOLE SODIUM 40 MG PO TBEC
40.0000 mg | DELAYED_RELEASE_TABLET | Freq: Every day | ORAL | Status: DC
  Administered 2018-04-04 – 2018-04-05 (×2): 40 mg via ORAL
  Filled 2018-04-03 (×2): qty 1

## 2018-04-03 MED ORDER — SODIUM CHLORIDE 0.9 % IV SOLN: 250.0000 mL | INTRAVENOUS | Status: DC | PRN

## 2018-04-03 MED ORDER — PIPERACILLIN-TAZOBACTAM 3.375 G IVPB
3.3750 g | Freq: Three times a day (TID) | INTRAVENOUS | Status: DC
  Administered 2018-04-03 – 2018-04-05 (×5): 3.375 g via INTRAVENOUS
  Filled 2018-04-03 (×5): qty 50

## 2018-04-03 NOTE — Progress Notes (Signed)
CODE SEPSIS - PHARMACY COMMUNICATION  **Broad Spectrum Antibiotics should be administered within 1 hour of Sepsis diagnosis**  Time Code Sepsis Called/Page Received: 1407  Antibiotics Ordered: none; cancelling code sepsis   Time of 1st antibiotic administration: N/A  Additional action taken by pharmacy: called nurse to check on antibiotic orders and administration informed code sepsis is being cancelled.   If necessary, Name of Provider/Nurse Contacted: Janith Lima ,PharmD Clinical Pharmacist  04/03/2018  2:44 PM

## 2018-04-03 NOTE — ED Notes (Signed)
Date and time results received: 04/03/18 3:31 PM    Test:PT 52.2 and INR 5.87   Name of Provider Notified: Dr. Cinda Quest  Orders Received? Or Actions Taken?:

## 2018-04-03 NOTE — Progress Notes (Signed)
Advanced care plan.  Purpose of the Encounter: CODE STATUS  Parties in Attendance: Patient and family  Patient's Decision Capacity: Good  Subjective/Patient's story: Presented to the emergency room for generalized weakness   Objective/Medical story Has ambulatory dysfunction Elevated WBC count Recently was treated for sepsis and cholecystitis has a biliary drain   Goals of care determination:  Advance care directives goals of care discussed with patient family They do not want any CPR, intubation ventilator if the need arises  CODE STATUS: DNR   Time spent discussing advanced care planning: 16 minutes

## 2018-04-03 NOTE — ED Notes (Signed)
Call to 1C 

## 2018-04-03 NOTE — ED Notes (Signed)
Report given to 1C 

## 2018-04-03 NOTE — ED Triage Notes (Signed)
Pt to ED via EMS from home c/o AMS for a couple days, per EMS pt baseline walking, conversing, and independent.  Pt recently treated at hospital for gallbladder infection with drainage tube in place upon arrival and currently on keflex.  Pt presents alert and oriented to person, place, and situation.

## 2018-04-03 NOTE — H&P (Signed)
[facility] [place] NAME: [name]    MR#:  [id number]  DATE OF BIRTH:  [birth year]  DATE OF ADMISSION:  04/03/2018  PRIMARY CARE PHYSICIAN: Adin Hector, MD   REQUESTING/REFERRING PHYSICIAN:   CHIEF COMPLAINT:   Chief Complaint  Patient presents with  . Altered Mental Status    HISTORY OF PRESENT ILLNESS: [name]  is a 84 y.o. male with a known history of atrial fibrillation, prostate cancer, GERD, hypertension, cholecystitis, sepsis with cholecystostomy tube presented to the emergency room for generalized weakness.  Patient unable to get up and move around at home.  He has home health services.  He was evaluated in the emergency room WBC count is elevated.  No abdominal pain.  No nausea and vomiting.  He was worked up with CT abdomen which showed well-positioned biliary drainage catheter.  Hospitalist service was consulted.  PAST MEDICAL HISTORY:   Past Medical History:  Diagnosis Date  . Atrial fibrillation ([place])   . Cancer ([place])    PROSTATE  . Dysrhythmia    AFIB  . GERD (gastroesophageal reflux disease)   . Hypertension     PAST SURGICAL HISTORY:  Past Surgical History:  Procedure Laterality Date  . CAROTID ENDARTERECTOMY Left   . CATARACT EXTRACTION W/PHACO Right 10/13/2015   Procedure: CATARACT EXTRACTION PHACO AND INTRAOCULAR LENS PLACEMENT (IOC);  Surgeon: Leandrew Koyanagi, MD;  Location: [facility] ORS;  Service: Ophthalmology;  Laterality: Right;  Lot # [id number] H US:01:23.3 AP:17.2 CDE:14.31  . CATARACT EXTRACTION W/PHACO Left 12/12/2015   Procedure: CATARACT EXTRACTION PHACO AND INTRAOCULAR LENS PLACEMENT (IOC);  Surgeon: Leandrew Koyanagi, MD;  Location: [facility] ORS;  Service: Ophthalmology;  Laterality: Left;  [place] 1.10 AP% 15.9 CDE 11.22 FLUID PACK LOT # [id number]  . IR PERC CHOLECYSTOSTOMY  03/23/2018  . JOINT REPLACEMENT Right    TKR    SOCIAL HISTORY:  Social History   Tobacco Use  . Smoking  status: Former Research scientist (life sciences)  . Smokeless tobacco: Never Used  Substance Use Topics  . Alcohol use: Not Currently    Comment: 1.5 oz daily - 6/3  last use    FAMILY HISTORY:  Family History  Problem Relation Age of Onset  . Heart attack Father     DRUG ALLERGIES: No Known Allergies  REVIEW OF SYSTEMS:   CONSTITUTIONAL: No fever,has fatigue and weakness.  EYES: No blurred or double vision.  EARS, NOSE, AND THROAT: No tinnitus or ear pain.  RESPIRATORY: No cough, shortness of breath, wheezing or hemoptysis.  CARDIOVASCULAR: No chest pain, orthopnea, edema.  GASTROINTESTINAL: No nausea, vomiting, diarrhea or abdominal pain.  GENITOURINARY: No dysuria, hematuria.  ENDOCRINE: No polyuria, nocturia,  HEMATOLOGY: No anemia, easy bruising or bleeding SKIN: No rash or lesion. MUSCULOSKELETAL: No joint pain or arthritis.   NEUROLOGIC: No tingling, numbness, weakness.  PSYCHIATRY: No anxiety or depression.   MEDICATIONS AT HOME:  Prior to Admission medications   Medication Sig Start Date End Date Taking? Authorizing Provider  amLODipine (NORVASC) 5 MG tablet Take 2.5 mg by mouth daily.  07/23/16  Yes [provider]  cephALEXin (KEFLEX) 500 MG capsule Take 1 capsule by mouth 3 (three) times daily. 04/01/18  Yes [provider]  Cholecalciferol (VITAMIN D-3) 1000 units CAPS Take 1,000 Units by mouth daily.    Yes [provider]  lovastatin (MEVACOR) 40 MG tablet Take 80 mg by mouth at bedtime.    Yes [provider]  metroNIDAZOLE (FLAGYL)  500 MG tablet Take 1 tablet by mouth 3 (three) times daily. 04/01/18  Yes [provider]  Multiple Vitamin (MULTIVITAMIN) capsule Take 1 capsule by mouth daily.   Yes [provider]  pantoprazole (PROTONIX) 40 MG tablet TAKE 1 TABLET BY MOUTH EVERY DAY 10/24/15  Yes [provider]  terazosin (HYTRIN) 2 MG capsule Take 2 mg by mouth at bedtime. 01/10/17  Yes [provider]  vitamin B-12  (CYANOCOBALAMIN) 1000 MCG tablet Take 1,000 mcg by mouth daily.   Yes [provider]  warfarin (COUMADIN) 1 MG tablet Take 3.5 mg by mouth daily.  02/06/18  Yes [provider]  fluconazole (DIFLUCAN) 200 MG tablet Take 1 tablet (200 mg total) by mouth daily. Patient not taking: Reported on 04/03/2018 03/25/18   [name], MD  methocarbamol (ROBAXIN) 500 MG tablet Take 1 tablet (500 mg total) by mouth every 6 (six) hours as needed for muscle spasms. Patient not taking: Reported on 08/30/2017 07/17/17   [name], MD      PHYSICAL EXAMINATION:   VITAL SIGNS: Blood pressure (!) 122/50, pulse 80, temperature 98.3 F (36.8 C), temperature source Oral, resp. rate (!) 30, height 5\' 10"  (1.778 m), weight 80.3 kg, SpO2 94 %.  GENERAL:  84 y.o.-year-old patient lying in the bed with no acute distress.  EYES: Pupils equal, round, reactive to light and accommodation. No scleral icterus. Extraocular muscles intact.  HEENT: Head atraumatic, normocephalic. Oropharynx and nasopharynx clear.  NECK:  Supple, no jugular venous distention. No thyroid enlargement, no tenderness.  LUNGS: Normal breath sounds bilaterally, no wheezing, rales,rhonchi or crepitation. No use of accessory muscles of respiration.  CARDIOVASCULAR: S1, S2 normal. No murmurs, rubs, or gallops.  ABDOMEN: Soft, nontender, nondistended. Bowel sounds present. No organomegaly or mass.  EXTREMITIES: No pedal edema, cyanosis, or clubbing.  Ecchymosis noted over the extremities NEUROLOGIC: Cranial nerves II through XII are intact. Muscle strength 5/5 in all extremities. Sensation intact. Gait not checked.  PSYCHIATRIC: The patient is alert and oriented x 3.  SKIN: No obvious rash, lesion, or ulcer.   LABORATORY PANEL:   CBC Recent Labs  Lab 04/03/18 1354  WBC 16.8*  HGB 11.5*  HCT 33.3*  PLT 298  MCV 96.0  MCH 33.1  MCHC 34.5  RDW 15.8*  LYMPHSABS 0.5*  MONOABS 0.4  EOSABS 0.1  BASOSABS 0.1    ------------------------------------------------------------------------------------------------------------------  Chemistries  Recent Labs  Lab 04/03/18 1354  NA 135  K 3.7  CL 101  CO2 23  GLUCOSE 114*  BUN 17  CREATININE 1.19  CALCIUM 8.6*  AST 22  ALT 12  ALKPHOS 51  BILITOT 0.6   ------------------------------------------------------------------------------------------------------------------ estimated creatinine clearance is 42.6 mL/min (by C-G formula based on SCr of 1.19 mg/dL). ------------------------------------------------------------------------------------------------------------------ No results for input(s): TSH, T4TOTAL, T3FREE, THYROIDAB in the last 72 hours.  Invalid input(s): FREET3   Coagulation profile Recent Labs  Lab 04/03/18 1354  INR 5.87*   ------------------------------------------------------------------------------------------------------------------- No results for input(s): DDIMER in the last 72 hours. -------------------------------------------------------------------------------------------------------------------  Cardiac Enzymes Recent Labs  Lab 04/03/18 1354  TROPONINI <0.03   ------------------------------------------------------------------------------------------------------------------ Invalid input(s): POCBNP  ---------------------------------------------------------------------------------------------------------------  Urinalysis    Component Value Date/Time   COLORURINE AMBER (A) 04/03/2018 1349   APPEARANCEUR CLEAR (A) 04/03/2018 1349   APPEARANCEUR Clear 04/07/2012 1815   LABSPEC 1.028 04/03/2018 1349   LABSPEC 1.042 04/07/2012 1815   PHURINE 5.0 04/03/2018 1349   GLUCOSEU NEGATIVE 04/03/2018 1349   GLUCOSEU Negative 04/07/2012 1815   HGBUR NEGATIVE  04/03/2018 1349   BILIRUBINUR SMALL (A) 04/03/2018 1349   BILIRUBINUR Negative 04/07/2012 1815   KETONESUR NEGATIVE 04/03/2018 1349   PROTEINUR 30 (A)  04/03/2018 1349   NITRITE NEGATIVE 04/03/2018 1349   LEUKOCYTESUR TRACE (A) 04/03/2018 1349   LEUKOCYTESUR Negative 04/07/2012 1815     RADIOLOGY: Ct Abdomen Pelvis W Contrast  Result Date: 04/03/2018 CLINICAL DATA:  Pt to ED via EMS from home c/o AMS and generalized abdomen pain for a couple days, per EMS pt baseline walking, conversing, and independent. Pt recently treated at hospital for gallbladder infection with drainage tube in place upon arrival and currently on keflex. EXAM: CT ABDOMEN AND PELVIS WITH CONTRAST TECHNIQUE: Multidetector CT imaging of the abdomen and pelvis was performed using the standard protocol following bolus administration of intravenous contrast. CONTRAST:  196mL ISOVUE-300 IOPAMIDOL (ISOVUE-300) INJECTION 61% COMPARISON:  03/23/2018. FINDINGS: Lower chest: Mild dependent subsegmental atelectasis. Trace right pleural effusion. No acute findings at the lung bases. Hepatobiliary: Biliary drainage catheter lies within the gallbladder. The gallbladder is decompressed. There are persistent gallstones. There is no adjacent inflammation. No extraluminal fluid collection is seen. Liver is normal in size and attenuation with no masses or focal lesions. No bile duct dilation. Pancreas: Unremarkable. No pancreatic ductal dilatation or surrounding inflammatory changes. Spleen: Normal in size without focal abnormality. Adrenals/Urinary Tract: No adrenal masses. Scarring along the posterior margin of the right kidney the midpole. Bilateral renal cortical thinning. 7 mm cyst arises from the left kidney lower pole. No convincing renal collecting system stone. No hydronephrosis. Ureters normal in course and in caliber. Bladder is unremarkable. Stomach/Bowel: Stomach and small bowel are within normal limits. Colon is normal in caliber. There are multiple left colon diverticula most prominent along the sigmoid. No diverticulitis or other colonic inflammatory process. No evidence of appendicitis.  Vascular/Lymphatic: Aortic atherosclerosis. No enlarged abdominal or pelvic lymph nodes. Reproductive: Unremarkable. Other: No abdominal wall hernia or abnormality. No abdominopelvic ascites. Musculoskeletal: No fracture or acute finding. No osteoblastic or osteolytic lesions. IMPRESSION: 1. No acute findings within the abdomen or pelvis. 2. Well-positioned biliary drainage catheter. Gallbladder is decompressed with no adjacent inflammatory changes. No pericholecystic fluid collection. 3. Multiple left colon diverticula. No diverticulitis or other bowel inflammatory process. 4. Aortic atherosclerosis. Electronically Signed   By: Lajean Manes M.D.   On: 04/03/2018 16:35   Dg Chest Portable 1 View  Result Date: 04/03/2018 CLINICAL DATA:  Altered mental status. EXAM: PORTABLE CHEST 1 VIEW COMPARISON:  Chest x-ray 03/23/2018. FINDINGS: Stable cardiomegaly. No pulmonary venous congestion. No focal infiltrate. Mild bibasilar subsegmental atelectasis. No pleural effusion or pneumothorax. IMPRESSION: 1.  Stable cardiomegaly. 2.  Mild bibasilar subsegmental atelectasis. Electronically Signed   By: Marcello Moores  Register   On: 04/03/2018 14:21    EKG: Orders placed or performed during the hospital encounter of 04/03/18  . ED EKG  . ED EKG  . EKG 12-Lead  . EKG 12-Lead  . EKG 12-Lead  . EKG 12-Lead  . ED EKG 12-Lead  . ED EKG 12-Lead    IMPRESSION AND PLAN:  84 year old male patient with history of cholecystitis, cholecystostomy tube, proximal atrial fibrillation, hyperlipidemia,  pressure ulcer to the left heel,GERD,hypertension presented to the emergency room for generalized weakness and difficulty ambulating tomorrow  -Leukocytosis tomorrow Probably systemic inflammatory response syndrome Start patient on IV Zosyn antibiotic Follow-up cultures and WBC count  - Ambulatory dysfunction Physical therapy evaluation  -History of for cholecystitis, cholecystostomy tube placement CT abdomen shows intact  position of biliary  drain catheter  -Coumadin coagulopathy Elevated INR Hold Coumadin tonight Follow-up INR in the morning  -Paroxysmal atrial fibrillation Patient is currently anticoagulated with Coumadin  -History of pressure ulcer left heel Wound care consultation  All the records are reviewed and case discussed with ED provider. Management plans discussed with the patient, family and they are in agreement.  CODE STATUS:DNR Code Status History    Date Active Date Inactive Code Status Order ID Comments User Context   03/23/2018 1607 03/25/2018 1807 DNR [id number]  Sela Hua, MD ED   07/15/2017 2300 07/17/2017 1903 Full Code [id number]  Amelia Jo, MD Inpatient    Questions for Most Recent Historical Code Status (Order [id number])    Question Answer Comment   In the event of cardiac or respiratory ARREST Do not call a "code blue"    In the event of cardiac or respiratory ARREST Do not perform Intubation, CPR, defibrillation or ACLS    In the event of cardiac or respiratory ARREST Use medication by any route, position, wound care, and other measures to relive pain and suffering. May use oxygen, suction and manual treatment of airway obstruction as needed for comfort.         Advance Directive Documentation     Most Recent Value  Type of Advance Directive  Healthcare Power of Attorney, Living will, Out of facility DNR (pink MOST or yellow form)  Pre-existing out of facility DNR order (yellow form or pink MOST form)  -  "MOST" Form in Place?  -       TOTAL TIME TAKING CARE OF THIS PATIENT: 51 minutes.    Saundra Shelling M.D on 04/03/2018 at 6:05 PM  Between 7am to 6pm - Pager - [phone]  After 6pm go to [email or web address] - password [email or web address] [facility]  [facility]  Office  [phone]  CC: Primary care physician; Adin Hector, MD

## 2018-04-03 NOTE — Progress Notes (Signed)
Pharmacy Antibiotic Note  [name] is a 84 y.o. male admitted on 04/03/2018 with sepsis.  Pharmacy has been consulted for Zosyn dosing.  Plan: Zosyn 3.375g IV q8h (4 hour infusion).  Height: 5\' 10"  (177.8 cm) Weight: 177 lb (80.3 kg) IBW/kg (Calculated) : 73  Temp (24hrs), Avg:98.3 F (36.8 C), Min:98.3 F (36.8 C), Max:98.3 F (36.8 C)  Recent Labs  Lab 04/03/18 1354  WBC 16.8*  CREATININE 1.19  LATICACIDVEN 1.6    Estimated Creatinine Clearance: 42.6 mL/min (by C-G formula based on SCr of 1.19 mg/dL).    No Known Allergies  Antimicrobials this admission: Cefepime 9/26 x 1 Zosyn 9/26 >>   Thank you for allowing pharmacy to be a part of this patient's care.  Paulina Fusi, PharmD, BCPS 04/03/2018 7:08 PM

## 2018-04-03 NOTE — ED Notes (Signed)
Call back to 1C as I have not heard back from last call

## 2018-04-03 NOTE — ED Notes (Signed)
Pt had some bleeding from 18g IV which was placed by EMS.  Redressed.

## 2018-04-03 NOTE — ED Provider Notes (Signed)
[facility] Emergency Department Provider Note   ____________________________________________   First MD Initiated Contact with Patient 04/03/18 1346     (approximate)  I have reviewed the triage vital signs and the nursing notes.   HISTORY  Chief Complaint Altered Mental Status    HPI [name] Gum. is a 84 y.o. male who comes from home.  His wife reports that he is very weak and could not get up out of the chair or bed.  Was recently admitted to the hospital with sepsis and cholecystitis.  He has a drain his gallbladder.  He is not complaining of any shortness of breath or belly pain or vomiting.  He does have a little bit of edema in his legs and he has a little bit of a wet cough.  He had a fever of 100.1 with EMS.  Patient was shivering violently when he got here.   Past Medical History:  Diagnosis Date  . Atrial fibrillation ([place])   . Cancer ([place])    PROSTATE  . Dysrhythmia    AFIB  . GERD (gastroesophageal reflux disease)   . Hypertension     Patient Active Problem List   Diagnosis Date Noted  . Acute cholecystitis 03/23/2018  . Fracture closed, pubis ([place]) 07/15/2017  . Bilateral carotid artery stenosis 01/01/2017  . Peripheral artery disease ([place]) 01/01/2017  . Essential hypertension 01/01/2017  . Hyperlipidemia 01/01/2017    Past Surgical History:  Procedure Laterality Date  . CAROTID ENDARTERECTOMY Left   . CATARACT EXTRACTION W/PHACO Right 10/13/2015   Procedure: CATARACT EXTRACTION PHACO AND INTRAOCULAR LENS PLACEMENT (IOC);  Surgeon: Leandrew Koyanagi, MD;  Location: [facility] ORS;  Service: Ophthalmology;  Laterality: Right;  Lot # [id number] H US:01:23.3 AP:17.2 CDE:14.31  . CATARACT EXTRACTION W/PHACO Left 12/12/2015   Procedure: CATARACT EXTRACTION PHACO AND INTRAOCULAR LENS PLACEMENT (IOC);  Surgeon: Leandrew Koyanagi, MD;  Location: [facility] ORS;  Service: Ophthalmology;  Laterality: Left;  [place] 1.10 AP% 15.9 CDE 11.22 FLUID  PACK LOT # [id number]  . IR PERC CHOLECYSTOSTOMY  03/23/2018  . JOINT REPLACEMENT Right    TKR    Prior to Admission medications   Medication Sig Start Date End Date Taking? Authorizing Provider  amLODipine (NORVASC) 5 MG tablet Take 2.5 mg by mouth daily.  07/23/16  Yes [provider]  cephALEXin (KEFLEX) 500 MG capsule Take 1 capsule by mouth 3 (three) times daily. 04/01/18  Yes [provider]  Cholecalciferol (VITAMIN D-3) 1000 units CAPS Take 1,000 Units by mouth daily.    Yes [provider]  lovastatin (MEVACOR) 40 MG tablet Take 80 mg by mouth at bedtime.    Yes [provider]  metroNIDAZOLE (FLAGYL) 500 MG tablet Take 1 tablet by mouth 3 (three) times daily. 04/01/18  Yes [provider]  Multiple Vitamin (MULTIVITAMIN) capsule Take 1 capsule by mouth daily.   Yes [provider]  pantoprazole (PROTONIX) 40 MG tablet TAKE 1 TABLET BY MOUTH EVERY DAY 10/24/15  Yes [provider]  terazosin (HYTRIN) 2 MG capsule Take 2 mg by mouth at bedtime. 01/10/17  Yes [provider]  vitamin B-12 (CYANOCOBALAMIN) 1000 MCG tablet Take 1,000 mcg by mouth daily.   Yes [provider]  warfarin (COUMADIN) 1 MG tablet Take 3.5 mg by mouth daily.  02/06/18  Yes [provider]  fluconazole (DIFLUCAN) 200 MG tablet Take 1 tablet (200 mg total) by mouth daily. Patient not taking: Reported on 04/03/2018 03/25/18   Vianne Bulls,  Snehalatha, MD  methocarbamol (ROBAXIN) 500 MG tablet Take 1 tablet (500 mg total) by mouth every 6 (six) hours as needed for muscle spasms. Patient not taking: Reported on 08/30/2017 07/17/17   [name], MD    Allergies Patient has no known allergies.  Family History  Problem Relation Age of Onset  . Heart attack Father     Social History Social History   Tobacco Use  . Smoking status: Former Research scientist (life sciences)  . Smokeless tobacco: Never Used  Substance Use Topics  . Alcohol use: Not Currently     Comment: 1.5 oz daily - 6/3  last use  . Drug use: No    Review of Systems  Constitutional: No fever/chills Eyes: No visual changes. ENT: No sore throat. Cardiovascular: Denies chest pain. Respiratory: Denies shortness of breath. Gastrointestinal: No abdominal pain.  No nausea, no vomiting.  No diarrhea.  No constipation. Genitourinary: Negative for dysuria. Musculoskeletal: Negative for back pain. Skin: Negative for rash. Neurological: Negative for headaches, focal weakness   ____________________________________________   PHYSICAL EXAM:  VITAL SIGNS: ED Triage Vitals  Enc Vitals Group     BP 04/03/18 1343 (!) 125/55     Pulse Rate 04/03/18 1343 91     Resp 04/03/18 1343 (!) 22     Temp 04/03/18 1343 98.3 F (36.8 C)     Temp Source 04/03/18 1343 Oral     SpO2 04/03/18 1343 95 %     Weight 04/03/18 1348 177 lb (80.3 kg)     Height 04/03/18 1348 5\' 10"  (1.778 m)     Head Circumference --      Peak Flow --      Pain Score 04/03/18 1348 0     Pain Loc --      Pain Edu? --      Excl. in [place]? --     Constitutional: Alert and oriented.  His breathing rapidly and looks very tired Eyes: Conjunctivae are normal. PERRL. EOMI. Head: Atraumatic. Nose: No congestion/rhinnorhea. Mouth/Throat: Mucous membranes are moist.  Oropharynx non-erythematous. Neck: No stridor. Cardiovascular: Normal rate, regular rhythm. Grossly normal heart sounds.  Good peripheral circulation. Respiratory: Normal respiratory effort.  No retractions. Lungs ankles and bases especially on the right base Gastrointestinal: Soft and nontender. No distention. No abdominal bruits. No CVA tenderness.  Drain in the right upper quadrant Musculoskeletal: No lower extremity tenderness trace bilateral edema.  Both arms are very bruised with redness surrounding the bruises.  They are not warm however neurologic:  Normal speech and language. No gross focal neurologic deficits are appreciated.  She is diffusely  weak Skin:  Skin is warm, dry and intact.  See upper extremities and musculoskeletal for description of skin Psychiatric: Mood and affect are normal. Speech and behavior are normal.  ____________________________________________   LABS (all labs ordered are listed, but only abnormal results are displayed)  Labs Reviewed  COMPREHENSIVE METABOLIC PANEL - Abnormal; Notable for the following components:      Result Value   Glucose, Bld 114 (*)    Calcium 8.6 (*)    Albumin 3.4 (*)    GFR calc non Af Amer 52 (*)    All other components within normal limits  BRAIN NATRIURETIC PEPTIDE - Abnormal; Notable for the following components:   B Natriuretic Peptide 354.0 (*)    All other components within normal limits  CBC WITH DIFFERENTIAL/PLATELET - Abnormal; Notable for the following components:   WBC 16.8 (*)    RBC 3.46 (*)  Hemoglobin 11.5 (*)    HCT 33.3 (*)    RDW 15.8 (*)    Neutro Abs 15.8 (*)    Lymphs Abs 0.5 (*)    All other components within normal limits  PROTIME-INR - Abnormal; Notable for the following components:   Prothrombin Time 52.2 (*)    INR 5.87 (*)    All other components within normal limits  URINALYSIS, COMPLETE (UACMP) WITH MICROSCOPIC - Abnormal; Notable for the following components:   Color, Urine AMBER (*)    APPearance CLEAR (*)    Bilirubin Urine SMALL (*)    Protein, ur 30 (*)    Leukocytes, UA TRACE (*)    All other components within normal limits  BLOOD GAS, VENOUS - Abnormal; Notable for the following components:   pH, Ven 7.48 (*)    pCO2, Ven 35 (*)    Acid-Base Excess 2.8 (*)    All other components within normal limits  CULTURE, BLOOD (ROUTINE X 2)  CULTURE, BLOOD (ROUTINE X 2)  TROPONIN I  LACTIC ACID, PLASMA   ____________________________________________  EKG  EKG is a very irregular baseline it is almost impossible to read it I do not see any ST elevation however.  I am uncertain if he has atrial fibrillation or  not. ____________________________________________  RADIOLOGY  ED MD interpretation: Chest x-ray and CT abdomen pelvis read by radiology reviewed by me show no cause of the patient's symptoms  Official radiology report(s): Ct Abdomen Pelvis W Contrast  Result Date: 04/03/2018 CLINICAL DATA:  Pt to ED via EMS from home c/o AMS and generalized abdomen pain for a couple days, per EMS pt baseline walking, conversing, and independent. Pt recently treated at hospital for gallbladder infection with drainage tube in place upon arrival and currently on keflex. EXAM: CT ABDOMEN AND PELVIS WITH CONTRAST TECHNIQUE: Multidetector CT imaging of the abdomen and pelvis was performed using the standard protocol following bolus administration of intravenous contrast. CONTRAST:  113mL ISOVUE-300 IOPAMIDOL (ISOVUE-300) INJECTION 61% COMPARISON:  03/23/2018. FINDINGS: Lower chest: Mild dependent subsegmental atelectasis. Trace right pleural effusion. No acute findings at the lung bases. Hepatobiliary: Biliary drainage catheter lies within the gallbladder. The gallbladder is decompressed. There are persistent gallstones. There is no adjacent inflammation. No extraluminal fluid collection is seen. Liver is normal in size and attenuation with no masses or focal lesions. No bile duct dilation. Pancreas: Unremarkable. No pancreatic ductal dilatation or surrounding inflammatory changes. Spleen: Normal in size without focal abnormality. Adrenals/Urinary Tract: No adrenal masses. Scarring along the posterior margin of the right kidney the midpole. Bilateral renal cortical thinning. 7 mm cyst arises from the left kidney lower pole. No convincing renal collecting system stone. No hydronephrosis. Ureters normal in course and in caliber. Bladder is unremarkable. Stomach/Bowel: Stomach and small bowel are within normal limits. Colon is normal in caliber. There are multiple left colon diverticula most prominent along the sigmoid. No  diverticulitis or other colonic inflammatory process. No evidence of appendicitis. Vascular/Lymphatic: Aortic atherosclerosis. No enlarged abdominal or pelvic lymph nodes. Reproductive: Unremarkable. Other: No abdominal wall hernia or abnormality. No abdominopelvic ascites. Musculoskeletal: No fracture or acute finding. No osteoblastic or osteolytic lesions. IMPRESSION: 1. No acute findings within the abdomen or pelvis. 2. Well-positioned biliary drainage catheter. Gallbladder is decompressed with no adjacent inflammatory changes. No pericholecystic fluid collection. 3. Multiple left colon diverticula. No diverticulitis or other bowel inflammatory process. 4. Aortic atherosclerosis. Electronically Signed   By: Lajean Manes M.D.   On: 04/03/2018 16:35  Dg Chest Portable 1 View  Result Date: 04/03/2018 CLINICAL DATA:  Altered mental status. EXAM: PORTABLE CHEST 1 VIEW COMPARISON:  Chest x-ray 03/23/2018. FINDINGS: Stable cardiomegaly. No pulmonary venous congestion. No focal infiltrate. Mild bibasilar subsegmental atelectasis. No pleural effusion or pneumothorax. IMPRESSION: 1.  Stable cardiomegaly. 2.  Mild bibasilar subsegmental atelectasis. Electronically Signed   By: Marcello Moores  Register   On: 04/03/2018 14:21    ____________________________________________   PROCEDURES  Procedure(s) performed:  Procedures  Critical Care performed:   ____________________________________________   INITIAL IMPRESSION / ASSESSMENT AND PLAN / ED COURSE  I cannot find a cause of the patient's elevated white blood count he still very weak and cannot sit up by himself.  He is still breathing fast.  I will give him to the hospitalist for observation to see if a focus of infection will declare itself.  He has had a chest x-ray CT of the abdomen and urinalysis and they are all negative.  He is INR is elevated but he is not having any acute bleeding.  Hold his Coumadin for that.         ____________________________________________   FINAL CLINICAL IMPRESSION(S) / ED DIAGNOSES  Final diagnoses:  Weakness  Leukocytosis, unspecified type     ED Discharge Orders    None       Note:  This document was prepared using Dragon voice recognition software and may include unintentional dictation errors.    Nena Polio, MD 04/03/18 [phone]

## 2018-04-04 DIAGNOSIS — D72829 Elevated white blood cell count, unspecified: Secondary | ICD-10-CM | POA: Diagnosis not present

## 2018-04-04 DIAGNOSIS — R6510 Systemic inflammatory response syndrome (SIRS) of non-infectious origin without acute organ dysfunction: Secondary | ICD-10-CM | POA: Diagnosis not present

## 2018-04-04 DIAGNOSIS — I4891 Unspecified atrial fibrillation: Secondary | ICD-10-CM | POA: Diagnosis not present

## 2018-04-04 DIAGNOSIS — D689 Coagulation defect, unspecified: Secondary | ICD-10-CM | POA: Diagnosis not present

## 2018-04-04 DIAGNOSIS — L8990 Pressure ulcer of unspecified site, unspecified stage: Secondary | ICD-10-CM

## 2018-04-04 LAB — BASIC METABOLIC PANEL
Anion gap: 8 (ref 5–15)
BUN: 18 mg/dL (ref 8–23)
CALCIUM: 8.2 mg/dL — AB (ref 8.9–10.3)
CO2: 22 mmol/L (ref 22–32)
CREATININE: 1.09 mg/dL (ref 0.61–1.24)
Chloride: 105 mmol/L (ref 98–111)
GFR, EST NON AFRICAN AMERICAN: 58 mL/min — AB (ref >60)
Glucose, Bld: 90 mg/dL (ref 70–99)
Potassium: 3.6 mmol/L (ref 3.5–5.1)
SODIUM: 135 mmol/L (ref 135–145)

## 2018-04-04 LAB — CBC
HCT: 28 % — ABNORMAL LOW (ref 40.0–52.0)
HEMOGLOBIN: 9.9 g/dL — AB (ref 13.0–18.0)
MCH: 34.4 pg — AB (ref 26.0–34.0)
MCHC: 35.3 g/dL (ref 32.0–36.0)
MCV: 97.2 fL (ref 80.0–100.0)
PLATELETS: 240 10*3/uL (ref 150–440)
RBC: 2.88 MIL/uL — ABNORMAL LOW (ref 4.40–5.90)
RDW: 16.2 % — AB (ref 11.5–14.5)
WBC: 10.7 10*3/uL — ABNORMAL HIGH (ref 3.8–10.6)

## 2018-04-04 LAB — PROTIME-INR
INR: 6.06 — AB
PROTHROMBIN TIME: 53.5 s — AB (ref 11.4–15.2)

## 2018-04-04 LAB — PROCALCITONIN: PROCALCITONIN: 0.13 ng/mL

## 2018-04-04 NOTE — Progress Notes (Signed)
ANTICOAGULATION CONSULT NOTE - Initial Consult  Pharmacy Consult for warfarin Indication: AF  No Known Allergies  Patient Measurements: Height: 5\' 11"  (180.3 cm) Weight: 179 lb 10.8 oz (81.5 kg) IBW/kg (Calculated) : 75.3 Heparin Dosing Weight:   Vital Signs: Temp: 97.5 F (36.4 C) (09/27 1244) Temp Source: Oral (09/27 1244) BP: 120/56 (09/27 1247) Pulse Rate: 69 (09/27 1247)  Labs: Recent Labs    04/03/18 1354 04/04/18 0258  HGB 11.5* 9.9*  HCT 33.3* 28.0*  PLT 298 240  LABPROT 52.2* 53.5*  INR 5.87* 6.06*  CREATININE 1.19 1.09  TROPONINI <0.03  --     Estimated Creatinine Clearance: 48 mL/min (by C-G formula based on SCr of 1.09 mg/dL).   Medical History: Past Medical History:  Diagnosis Date  . Atrial fibrillation ([place])   . Cancer ([place])    PROSTATE  . Dysrhythmia    AFIB  . GERD (gastroesophageal reflux disease)   . Hypertension     Medications:  Infusions:  . sodium chloride    . piperacillin-tazobactam (ZOSYN)  IV 3.375 g (04/04/18 0546)    Assessment: 89 yom cc SIRS vs sepsis presents with gallbladder drain in place. Had been on keflex and metronidazole PTA. Takes VKA for AF but dose does not seem to have been adjusted for DDI and patient presents with supratherapeutic INR. Pharmacy consulted to manage VKA.   DATE INR DOSE 9/26 5.87 Held 9/27 6.06  Goal of Therapy:  INR 2-3 Monitor platelets by anticoagulation protocol: Yes   Plan:  INR supratherapeutic. Continue to hold for today and re-evaluate daily.   Laural Benes, Pharm.D., BCPS Clinical Pharmacist 04/04/2018,12:58 PM

## 2018-04-04 NOTE — Care Management Obs Status (Signed)
[facility] NOTIFICATION   Patient Details  Name: [name] MRN: [id number] Date of Birth: [birth year]   Medicare Observation Status Notification Given:  Yes. Explained to Mr. [name], RN 04/04/2018, 8:40 AM

## 2018-04-04 NOTE — Progress Notes (Signed)
PT Cancellation Note  Patient Details Name: [name] MRN: [id number] DOB: July 28, 1925   Cancelled Treatment:    Reason Eval/Treat Not Completed: (Consult received and chart reviewed.  Noted with critically elevated INR (6.06), contraindicated for exertional activity at this time.  Will re-attempt at later time/date as medically appropriate.)   Harkirat Orozco H. Owens Shark, PT, DPT, NCS 04/04/18, 9:17 AM [phone]

## 2018-04-04 NOTE — Care Management Note (Signed)
Case Management Note  Patient Details  Name: [name] MRN: [id number] Date of Birth: [birth year]  Subjective/Objective:   Admitted to [facility] under observation status with the diagnosis of systemic inflammatory response syndrom. Lives with wife, Jeanett Schlein [phone]). In Dr. Olin Pia office 3 days ago. Prescriptions are filled at [facility]. Currently open to [facility]. [facility] in the past. Rolling walker, cane, and raised toilet seat in the home. Self feed, needs help with bathing and dressing. Last fall was a year ago. Fair-good diet.                 Action/Plan: Will continue to follow for discharge plans   Expected Discharge Date:                  Expected Discharge Plan:     In-House Referral:     Discharge planning Services     Post Acute Care Choice:    Choice offered to:     DME Arranged:    DME Agency:     HH Arranged:    HH Agency:     Status of Service:     If discussed at [employer] of Stay Meetings, dates discussed:    Additional Comments:  Shelbie Ammons, RN MSN CCM Care Management [phone] 04/04/2018, 8:52 AM

## 2018-04-04 NOTE — Progress Notes (Signed)
[place] [facility] [place] NAME: [name]    MR#:  [id number]  DATE OF BIRTH:  [birth year]  SUBJECTIVE:  CHIEF COMPLAINT:   Chief Complaint  Patient presents with  . Altered Mental Status   -Status post acute cholecystitis and cholecystostomy tube placed about 10 days ago.  Coming in with weakness. -WBC elevated on admission.  Improving with antibiotics  REVIEW OF SYSTEMS:  Review of Systems  Constitutional: Positive for malaise/fatigue. Negative for chills and fever.  HENT: Positive for hearing loss. Negative for congestion, ear discharge and nosebleeds.   Eyes: Negative for blurred vision and double vision.  Respiratory: Negative for cough, shortness of breath and wheezing.   Cardiovascular: Negative for chest pain and palpitations.  Gastrointestinal: Negative for abdominal pain, constipation, diarrhea, nausea and vomiting.  Genitourinary: Negative for dysuria.  Musculoskeletal: Negative for myalgias.  Neurological: Negative for dizziness, focal weakness, seizures, weakness and headaches.  Psychiatric/Behavioral: Negative for depression.    DRUG ALLERGIES:  No Known Allergies  VITALS:  Blood pressure (!) 119/57, pulse 75, temperature 98.9 F (37.2 C), temperature source Oral, resp. rate 19, height 5\' 11"  (1.803 m), weight 81.5 kg, SpO2 92 %.  PHYSICAL EXAMINATION:  Physical Exam  GENERAL:  84 y.o.-year-old elderly patient lying in the bed with no acute distress.  EYES: Pupils equal, round, reactive to light and accommodation. No scleral icterus. Extraocular muscles intact.  HEENT: Head atraumatic, normocephalic. Oropharynx and nasopharynx clear.  NECK:  Supple, no jugular venous distention. No thyroid enlargement, no tenderness.  LUNGS: Normal breath sounds bilaterally, no wheezing, rales,rhonchi or crepitation. No use of accessory muscles of respiration.  CARDIOVASCULAR: S1, S2 normal. No murmurs, rubs, or gallops.  ABDOMEN:  Soft, RUQ cholecystostomy drain in  place,  nontender, nondistended. Bowel sounds present. No organomegaly or mass.  EXTREMITIES: No pedal edema, cyanosis, or clubbing.  NEUROLOGIC: Cranial nerves II through XII are intact. Muscle strength 5/5 in all extremities. Sensation intact. Gait not checked.  Global weakness noted PSYCHIATRIC: The patient is alert and oriented x 2-3  SKIN: No obvious rash, lesion, or ulcer.    LABORATORY PANEL:   CBC Recent Labs  Lab 04/04/18 0258  WBC 10.7*  HGB 9.9*  HCT 28.0*  PLT 240   ------------------------------------------------------------------------------------------------------------------  Chemistries  Recent Labs  Lab 04/03/18 1354 04/04/18 0258  NA 135 135  K 3.7 3.6  CL 101 105  CO2 23 22  GLUCOSE 114* 90  BUN 17 18  CREATININE 1.19 1.09  CALCIUM 8.6* 8.2*  AST 22  --   ALT 12  --   ALKPHOS 51  --   BILITOT 0.6  --    ------------------------------------------------------------------------------------------------------------------  Cardiac Enzymes Recent Labs  Lab 04/03/18 1354  TROPONINI <0.03   ------------------------------------------------------------------------------------------------------------------  RADIOLOGY:  Ct Abdomen Pelvis W Contrast  Result Date: 04/03/2018 CLINICAL DATA:  Pt to ED via EMS from home c/o AMS and generalized abdomen pain for a couple days, per EMS pt baseline walking, conversing, and independent. Pt recently treated at hospital for gallbladder infection with drainage tube in place upon arrival and currently on keflex. EXAM: CT ABDOMEN AND PELVIS WITH CONTRAST TECHNIQUE: Multidetector CT imaging of the abdomen and pelvis was performed using the standard protocol following bolus administration of intravenous contrast. CONTRAST:  139mL ISOVUE-300 IOPAMIDOL (ISOVUE-300) INJECTION 61% COMPARISON:  03/23/2018. FINDINGS: Lower chest: Mild dependent subsegmental atelectasis. Trace right pleural  effusion. No acute findings at the lung bases. Hepatobiliary: Biliary drainage catheter  lies within the gallbladder. The gallbladder is decompressed. There are persistent gallstones. There is no adjacent inflammation. No extraluminal fluid collection is seen. Liver is normal in size and attenuation with no masses or focal lesions. No bile duct dilation. Pancreas: Unremarkable. No pancreatic ductal dilatation or surrounding inflammatory changes. Spleen: Normal in size without focal abnormality. Adrenals/Urinary Tract: No adrenal masses. Scarring along the posterior margin of the right kidney the midpole. Bilateral renal cortical thinning. 7 mm cyst arises from the left kidney lower pole. No convincing renal collecting system stone. No hydronephrosis. Ureters normal in course and in caliber. Bladder is unremarkable. Stomach/Bowel: Stomach and small bowel are within normal limits. Colon is normal in caliber. There are multiple left colon diverticula most prominent along the sigmoid. No diverticulitis or other colonic inflammatory process. No evidence of appendicitis. Vascular/Lymphatic: Aortic atherosclerosis. No enlarged abdominal or pelvic lymph nodes. Reproductive: Unremarkable. Other: No abdominal wall hernia or abnormality. No abdominopelvic ascites. Musculoskeletal: No fracture or acute finding. No osteoblastic or osteolytic lesions. IMPRESSION: 1. No acute findings within the abdomen or pelvis. 2. Well-positioned biliary drainage catheter. Gallbladder is decompressed with no adjacent inflammatory changes. No pericholecystic fluid collection. 3. Multiple left colon diverticula. No diverticulitis or other bowel inflammatory process. 4. Aortic atherosclerosis. Electronically Signed   By: Lajean Manes M.D.   On: 04/03/2018 16:35   Dg Chest Portable 1 View  Result Date: 04/03/2018 CLINICAL DATA:  Altered mental status. EXAM: PORTABLE CHEST 1 VIEW COMPARISON:  Chest x-ray 03/23/2018. FINDINGS: Stable  cardiomegaly. No pulmonary venous congestion. No focal infiltrate. Mild bibasilar subsegmental atelectasis. No pleural effusion or pneumothorax. IMPRESSION: 1.  Stable cardiomegaly. 2.  Mild bibasilar subsegmental atelectasis. Electronically Signed   By: Marcello Moores  Register   On: 04/03/2018 14:21    EKG:   Orders placed or performed during the hospital encounter of 04/03/18  . ED EKG  . ED EKG  . EKG 12-Lead  . EKG 12-Lead  . EKG 12-Lead  . EKG 12-Lead  . ED EKG 12-Lead  . ED EKG 12-Lead    ASSESSMENT AND PLAN:   84 year old male with past medical history significant for A. fib on Coumadin, prostate cancer, hypertension who had a recent cholecystostomy tube placed brought in secondary to weakness.  1.  Weakness-likely secondary to systemic inflammatory response syndrome and deconditioning -Physical therapy evaluation for the same  2.  Sirs-secondary to recent cholecystitis and procedure. -Cultures are pending.  Continue Zosyn for now -WBC is improving  3.  A. fib-rate controlled.  Supratherapeutic INR, Coumadin on hold -Pharmacy adjusting the dose.  4.  Hypertension-low-dose Norvasc  5.  GERD-Protonix  6.  DVT prophylaxis-Coumadin will be restarted once INR is therapeutic   PT consult is pending.  Updated wife at bedside.   All the records are reviewed and case discussed with Care Management/Social Workerr. Management plans discussed with the patient, family and they are in agreement.  CODE STATUS: DNR  TOTAL TIME TAKING CARE OF THIS PATIENT: 37 minutes.   POSSIBLE D/C IN 1-2 DAYS, DEPENDING ON CLINICAL CONDITION.   Gladstone Lighter M.D on 04/04/2018 at 12:44 PM  Between 7am to 6pm - Pager - [phone]  After 6pm go to [email or web address] - password [email or web address] [facility]  Office  [phone]  CC: Primary care physician; Adin Hector, MD

## 2018-04-05 DIAGNOSIS — I10 Essential (primary) hypertension: Secondary | ICD-10-CM | POA: Diagnosis not present

## 2018-04-05 DIAGNOSIS — Z7401 Bed confinement status: Secondary | ICD-10-CM | POA: Diagnosis not present

## 2018-04-05 DIAGNOSIS — R531 Weakness: Secondary | ICD-10-CM | POA: Diagnosis not present

## 2018-04-05 DIAGNOSIS — M6281 Muscle weakness (generalized): Secondary | ICD-10-CM | POA: Diagnosis not present

## 2018-04-05 DIAGNOSIS — I4891 Unspecified atrial fibrillation: Secondary | ICD-10-CM | POA: Diagnosis not present

## 2018-04-05 DIAGNOSIS — D689 Coagulation defect, unspecified: Secondary | ICD-10-CM | POA: Diagnosis not present

## 2018-04-05 LAB — BASIC METABOLIC PANEL
Anion gap: 8 (ref 5–15)
BUN: 21 mg/dL (ref 8–23)
CHLORIDE: 102 mmol/L (ref 98–111)
CO2: 23 mmol/L (ref 22–32)
CREATININE: 1.04 mg/dL (ref 0.61–1.24)
Calcium: 8.2 mg/dL — ABNORMAL LOW (ref 8.9–10.3)
GFR calc Af Amer: >60 mL/min (ref >60)
GFR calc non Af Amer: >60 mL/min (ref >60)
GLUCOSE: 98 mg/dL (ref 70–99)
Potassium: 3.7 mmol/L (ref 3.5–5.1)
SODIUM: 133 mmol/L — AB (ref 135–145)

## 2018-04-05 LAB — CBC
HCT: 27.6 % — ABNORMAL LOW (ref 40.0–52.0)
Hemoglobin: 9.6 g/dL — ABNORMAL LOW (ref 13.0–18.0)
MCH: 33.3 pg (ref 26.0–34.0)
MCHC: 34.7 g/dL (ref 32.0–36.0)
MCV: 96 fL (ref 80.0–100.0)
Platelets: 257 10*3/uL (ref 150–440)
RBC: 2.87 MIL/uL — ABNORMAL LOW (ref 4.40–5.90)
RDW: 16.1 % — ABNORMAL HIGH (ref 11.5–14.5)
WBC: 8.5 10*3/uL (ref 3.8–10.6)

## 2018-04-05 LAB — PROTIME-INR
INR: 4.7
PROTHROMBIN TIME: 43.9 s — AB (ref 11.4–15.2)

## 2018-04-05 LAB — PROCALCITONIN: Procalcitonin: <0.1 ng/mL

## 2018-04-05 MED ORDER — AMOXICILLIN-POT CLAVULANATE 875-125 MG PO TABS: 1.0000 | ORAL_TABLET | Freq: Two times a day (BID) | ORAL | 0 refills | Status: AC

## 2018-04-05 NOTE — Discharge Summary (Signed)
[facility] [place] NAME: Ramin Zoll    MR#:  [id number]  DATE OF BIRTH:  Nov 15, 1925  DATE OF ADMISSION:  04/03/2018 ADMITTING PHYSICIAN: Saundra Shelling, MD  DATE OF DISCHARGE: September 22,019  PRIMARY CARE PHYSICIAN: [name], MD    ADMISSION DIAGNOSIS:  Weakness [R53.1] Leukocytosis, unspecified type [D72.829] SIRS (systemic inflammatory response syndrome) (HCC) [R65.10]  DISCHARGE DIAGNOSIS:  Active Problems: Generalized weakness  PAF SECONDARY DIAGNOSIS:   Past Medical History:  Diagnosis Date  . Atrial fibrillation ([place])   . Cancer ([place])    PROSTATE  . Dysrhythmia    AFIB  . GERD (gastroesophageal reflux disease)   . Hypertension     HOSPITAL COURSE:   84 year old male with history of atrial fibrillation on Coumadin, prostate cancer and essential hypertension who had recent cholecystostomy tube placed who was brought into the hospital due to generalized weakness.  1.  Generalized weakness due to deconditioning  Patient met criteria for Sirs syndrome and was placed on broad-spectrum antibiotics.  He will be empirically discharged on Augmentin  2.  Atrial fibrillation: Coumadin level is 6 and therefore Coumadin has been discontinued for now.  INR needs to be checked on Monday and Coumadin should be adjusted accordingly. He is currently in normal sinus rhythm  3.  Essential hypertension: Continue Norvasc  DISCHARGE CONDITIONS AND DIET:   Stable for discharge on regular diet  CONSULTS OBTAINED:    DRUG ALLERGIES:  No Known Allergies  DISCHARGE MEDICATIONS:   Allergies as of 04/05/2018   No Known Allergies     Medication List    STOP taking these medications   cephALEXin 500 MG capsule Commonly known as:  KEFLEX   fluconazole 200 MG tablet Commonly known as:  DIFLUCAN   methocarbamol 500 MG tablet Commonly known as:  ROBAXIN   metroNIDAZOLE 500 MG tablet Commonly known as:  FLAGYL    warfarin 1 MG tablet Commonly known as:  COUMADIN     TAKE these medications   amLODipine 5 MG tablet Commonly known as:  NORVASC Take 2.5 mg by mouth daily.   amoxicillin-clavulanate 875-125 MG tablet Commonly known as:  AUGMENTIN Take 1 tablet by mouth 2 (two) times daily.   lovastatin 40 MG tablet Commonly known as:  MEVACOR Take 80 mg by mouth at bedtime.   multivitamin capsule Take 1 capsule by mouth daily.   pantoprazole 40 MG tablet Commonly known as:  PROTONIX TAKE 1 TABLET BY MOUTH EVERY DAY   terazosin 2 MG capsule Commonly known as:  HYTRIN Take 2 mg by mouth at bedtime.   vitamin B-12 1000 MCG tablet Commonly known as:  CYANOCOBALAMIN Take 1,000 mcg by mouth daily.   Vitamin D-3 1000 units Caps Take 1,000 Units by mouth daily.         Today   CHIEF COMPLAINT:  Patient doing well no acute issues overnight would like to go home today   VITAL SIGNS:  Blood pressure (!) 116/54, pulse 68, temperature 98 F (36.7 C), temperature source Oral, resp. rate 17, height _0  (1.803 m), weight 81.5 kg, SpO2 96 %.   REVIEW OF SYSTEMS:  Review of Systems  Constitutional: Negative.  Negative for chills, fever and malaise/fatigue.  HENT: Negative.  Negative for ear discharge, ear pain, hearing loss, nosebleeds and sore throat.   Eyes: Negative.  Negative for blurred vision and pain.  Respiratory: Negative.  Negative for cough, hemoptysis, shortness of breath and wheezing.  Cardiovascular: Negative.  Negative for chest pain, palpitations and leg swelling.  Gastrointestinal: Negative.  Negative for abdominal pain, blood in stool, diarrhea, nausea and vomiting.  Genitourinary: Negative.  Negative for dysuria.  Musculoskeletal: Negative.  Negative for back pain.  Skin: Negative.   Neurological: Negative for dizziness, tremors, speech change, focal weakness, seizures and headaches.  Endo/Heme/Allergies: Negative.  Does not bruise/bleed easily.   Psychiatric/Behavioral: Negative.  Negative for depression, hallucinations and suicidal ideas.     PHYSICAL EXAMINATION:  GENERAL:  84 y.o.-year-old patient lying in the bed with no acute distress.  NECK:  Supple, no jugular venous distention. No thyroid enlargement, no tenderness.  LUNGS: Normal breath sounds bilaterally, no wheezing, rales,rhonchi  No use of accessory muscles of respiration.  CARDIOVASCULAR: S1, S2 normal. No murmurs, rubs, or gallops.  ABDOMEN: Soft, non-tender, non-distended. Bowel sounds present. No organomegaly or mass.  EXTREMITIES: No pedal edema, cyanosis, or clubbing.  PSYCHIATRIC: The patient is alert and oriented x 3.  SKIN: No obvious rash, lesion, or ulcer.   DATA REVIEW:   CBC Recent Labs  Lab 04/05/18 0542  WBC 8.5  HGB 9.6*  HCT 27.6*  PLT 257    Chemistries  Recent Labs  Lab 04/03/18 1354  04/05/18 0542  NA 135   < > 133*  K 3.7   < > 3.7  CL 101   < > 102  CO2 23   < > 23  GLUCOSE 114*   < > 98  BUN 17   < > 21  CREATININE 1.19   < > 1.04  CALCIUM 8.6*   < > 8.2*  AST 22  --   --   ALT 12  --   --   ALKPHOS 51  --   --   BILITOT 0.6  --   --    < > = values in this interval not displayed.    Cardiac Enzymes Recent Labs  Lab 04/03/18 1354  TROPONINI <0.03    Microbiology Results  _0 @  RADIOLOGY:  Ct Abdomen Pelvis W Contrast  Result Date: 04/03/2018 CLINICAL DATA:  Pt to ED via EMS from home c/o AMS and generalized abdomen pain for a couple days, per EMS pt baseline walking, conversing, and independent. Pt recently treated at hospital for gallbladder infection with drainage tube in place upon arrival and currently on keflex. EXAM: CT ABDOMEN AND PELVIS WITH CONTRAST TECHNIQUE: Multidetector CT imaging of the abdomen and pelvis was performed using the standard protocol following bolus administration of intravenous contrast. CONTRAST:  157m ISOVUE-300 IOPAMIDOL (ISOVUE-300) INJECTION 61% COMPARISON:  03/23/2018.  FINDINGS: Lower chest: Mild dependent subsegmental atelectasis. Trace right pleural effusion. No acute findings at the lung bases. Hepatobiliary: Biliary drainage catheter lies within the gallbladder. The gallbladder is decompressed. There are persistent gallstones. There is no adjacent inflammation. No extraluminal fluid collection is seen. Liver is normal in size and attenuation with no masses or focal lesions. No bile duct dilation. Pancreas: Unremarkable. No pancreatic ductal dilatation or surrounding inflammatory changes. Spleen: Normal in size without focal abnormality. Adrenals/Urinary Tract: No adrenal masses. Scarring along the posterior margin of the right kidney the midpole. Bilateral renal cortical thinning. 7 mm cyst arises from the left kidney lower pole. No convincing renal collecting system stone. No hydronephrosis. Ureters normal in course and in caliber. Bladder is unremarkable. Stomach/Bowel: Stomach and small bowel are within normal limits. Colon is normal in caliber. There are multiple left colon diverticula most prominent along the sigmoid. No diverticulitis  or other colonic inflammatory process. No evidence of appendicitis. Vascular/Lymphatic: Aortic atherosclerosis. No enlarged abdominal or pelvic lymph nodes. Reproductive: Unremarkable. Other: No abdominal wall hernia or abnormality. No abdominopelvic ascites. Musculoskeletal: No fracture or acute finding. No osteoblastic or osteolytic lesions. IMPRESSION: 1. No acute findings within the abdomen or pelvis. 2. Well-positioned biliary drainage catheter. Gallbladder is decompressed with no adjacent inflammatory changes. No pericholecystic fluid collection. 3. Multiple left colon diverticula. No diverticulitis or other bowel inflammatory process. 4. Aortic atherosclerosis. Electronically Signed   By: Lajean Manes M.D.   On: 04/03/2018 16:35   Dg Chest Portable 1 View  Result Date: 04/03/2018 CLINICAL DATA:  Altered mental status. EXAM:  PORTABLE CHEST 1 VIEW COMPARISON:  Chest x-ray 03/23/2018. FINDINGS: Stable cardiomegaly. No pulmonary venous congestion. No focal infiltrate. Mild bibasilar subsegmental atelectasis. No pleural effusion or pneumothorax. IMPRESSION: 1.  Stable cardiomegaly. 2.  Mild bibasilar subsegmental atelectasis. Electronically Signed   By: Marcello Moores  Register   On: 04/03/2018 14:21      Allergies as of 04/05/2018   No Known Allergies     Medication List    STOP taking these medications   cephALEXin 500 MG capsule Commonly known as:  KEFLEX   fluconazole 200 MG tablet Commonly known as:  DIFLUCAN   methocarbamol 500 MG tablet Commonly known as:  ROBAXIN   metroNIDAZOLE 500 MG tablet Commonly known as:  FLAGYL   warfarin 1 MG tablet Commonly known as:  COUMADIN     TAKE these medications   amLODipine 5 MG tablet Commonly known as:  NORVASC Take 2.5 mg by mouth daily.   amoxicillin-clavulanate 875-125 MG tablet Commonly known as:  AUGMENTIN Take 1 tablet by mouth 2 (two) times daily.   lovastatin 40 MG tablet Commonly known as:  MEVACOR Take 80 mg by mouth at bedtime.   multivitamin capsule Take 1 capsule by mouth daily.   pantoprazole 40 MG tablet Commonly known as:  PROTONIX TAKE 1 TABLET BY MOUTH EVERY DAY   terazosin 2 MG capsule Commonly known as:  HYTRIN Take 2 mg by mouth at bedtime.   vitamin B-12 1000 MCG tablet Commonly known as:  CYANOCOBALAMIN Take 1,000 mcg by mouth daily.   Vitamin D-3 1000 units Caps Take 1,000 Units by mouth daily.          Management plans discussed with the patient and he is in agreement. Stable for discharge home with [facility]  Patient should follow up with pcp  CODE STATUS:     Code Status Orders  (From admission, onward)         Start     Ordered   04/03/18 2025  Do not attempt resuscitation (DNR)  Continuous    Question Answer Comment  In the event of cardiac or respiratory ARREST Do not call a "code blue"   In the event  of cardiac or respiratory ARREST Do not perform Intubation, CPR, defibrillation or ACLS   In the event of cardiac or respiratory ARREST Use medication by any route, position, wound care, and other measures to relive pain and suffering. May use oxygen, suction and manual treatment of airway obstruction as needed for comfort.      04/03/18 2024        Code Status History    Date Active Date Inactive Code Status Order ID Comments User Context   03/23/2018 1607 03/25/2018 1807 DNR [id number]  Sela Hua, MD ED   07/15/2017 2300 07/17/2017 1903 Full Code [id number]  Amelia Jo,  MD Inpatient    Advance Directive Documentation     Most Recent Value  Type of Advance Directive  Out of facility DNR (pink MOST or yellow form)  Pre-existing out of facility DNR order (yellow form or pink MOST form)  -  "MOST" Form in Place?  -      TOTAL TIME TAKING CARE OF THIS PATIENT: 38 minutes.    Note: This dictation was prepared with Dragon dictation along with smaller phrase technology. Any transcriptional errors that result from this process are unintentional.  Kasai Beltran M.D on 04/05/2018 at 8:39 AM  Between 7am to 6pm - Pager - [phone] After 6pm go to [email or web address] - password [email or web address] [facility]  Office  [phone]  CC: Primary care physician; Adin Hector, MD

## 2018-04-05 NOTE — Care Management Note (Signed)
Case Management Note  Patient Details  Name: [name] MRN: [id number] Date of Birth: Jul 01, 1926  Subjective/Objective:   Patient to be discharged per MD order. Orders in place for home health services. Patient previously open to [facility] for services. Patient agreeable to return home with home health. Multiple DME already in home and has no further equipment needs. Notified [facility] of discharge. Orders for PT,RN and aide. Family to provide transport.  Ines Bloomer RN BSN RNCM [phone]                    Action/Plan:   Expected Discharge Date:  04/05/18               Expected Discharge Plan:  [place]  In-House Referral:     Discharge planning Services  CM Consult  Post Acute Care Choice:  Resumption of Svcs/PTA Provider Choice offered to:  Patient  DME Arranged:    DME Agency:     HH Arranged:  RN, PT, Nurse's Aide [place] Agency:  [facility]  Status of Service:  Completed, signed off  If discussed at [place] of Stay Meetings, dates discussed:    Additional Comments:  Latanya Maudlin, RN 04/05/2018, 9:17 AM

## 2018-04-05 NOTE — Progress Notes (Signed)
Patient discharged via EMS to home. Madlyn Frankel, RN

## 2018-04-05 NOTE — Clinical Social Work Note (Signed)
The CSW met with the patient and his wife at bedside to discuss discharge planning. The patient and his wife have declined SNF due to past experience and patient's AD for no SNF care. The patient will discharge home today with resumption of his home health as arranged by the [facility]. The CSW has delivered a medical necessity form for EMS transport. CSW is signing off. Please consult should needs arise.  Santiago Bumpers, MSW, Latanya Presser [phone]

## 2018-04-05 NOTE — Progress Notes (Addendum)
[place] [place] NAME: [name]    MR#:  [id number]  DATE OF BIRTH:  1926-05-27  SUBJECTIVE:  Plan to go home but patient too weak pt recs snf  REVIEW OF SYSTEMS:    Review of Systems  Constitutional: Negative for fever, chills weight loss HENT: Negative for ear pain, nosebleeds, congestion, facial swelling, rhinorrhea, neck pain, neck stiffness and ear discharge.   Respiratory: Negative for cough, shortness of breath, wheezing  Cardiovascular: Negative for chest pain, palpitations and leg swelling.  Gastrointestinal: Negative for heartburn, abdominal pain, vomiting, diarrhea or consitpation Genitourinary: Negative for dysuria, urgency, frequency, hematuria Musculoskeletal: Negative for back pain or joint pain Neurological: Negative for dizziness, seizures, syncope, focal weakness,  numbness and headaches.  Hematological: Does not bruise/bleed easily.  Psychiatric/Behavioral: Negative for hallucinations, confusion, dysphoric mood    Tolerating Diet: yes      DRUG ALLERGIES:  No Known Allergies  VITALS:  Blood pressure (!) 116/54, pulse 68, temperature 98 F (36.7 C), temperature source Oral, resp. rate 17, height '5\' 11"'$  (1.803 m), weight 81.5 kg, SpO2 96 %.  PHYSICAL EXAMINATION:  Constitutional: Appears well-developed and well-nourished. No distress. HENT: Normocephalic. Marland Kitchen Oropharynx is clear and moist.  Eyes: Conjunctivae and EOM are normal. PERRLA, no scleral icterus.  Neck: Normal ROM. Neck supple. No JVD. No tracheal deviation. CVS: RRR, S1/S2 +, no murmurs, no gallops, no carotid bruit.  Pulmonary: Effort and breath sounds normal, no stridor, rhonchi, wheezes, rales.  Abdominal: Soft. BS +,  no distension, tenderness, rebound or guarding.  Musculoskeletal: Normal range of motion. No edema and no tenderness.  Neuro: Alert. CN 2-12 grossly intact. No focal deficits. Skin: Skin is warm and dry. No rash  noted. Psychiatric: Normal mood and affect.      LABORATORY PANEL:   CBC Recent Labs  Lab 04/05/18 0542  WBC 8.5  HGB 9.6*  HCT 27.6*  PLT 257   ------------------------------------------------------------------------------------------------------------------  Chemistries  Recent Labs  Lab 04/03/18 1354  04/05/18 0542  NA 135   < > 133*  K 3.7   < > 3.7  CL 101   < > 102  CO2 23   < > 23  GLUCOSE 114*   < > 98  BUN 17   < > 21  CREATININE 1.19   < > 1.04  CALCIUM 8.6*   < > 8.2*  AST 22  --   --   ALT 12  --   --   ALKPHOS 51  --   --   BILITOT 0.6  --   --    < > = values in this interval not displayed.   ------------------------------------------------------------------------------------------------------------------  Cardiac Enzymes Recent Labs  Lab 04/03/18 1354  TROPONINI <0.03   ------------------------------------------------------------------------------------------------------------------  RADIOLOGY:  Ct Abdomen Pelvis W Contrast  Result Date: 04/03/2018 CLINICAL DATA:  Pt to ED via EMS from home c/o AMS and generalized abdomen pain for a couple days, per EMS pt baseline walking, conversing, and independent. Pt recently treated at hospital for gallbladder infection with drainage tube in place upon arrival and currently on keflex. EXAM: CT ABDOMEN AND PELVIS WITH CONTRAST TECHNIQUE: Multidetector CT imaging of the abdomen and pelvis was performed using the standard protocol following bolus administration of intravenous contrast. CONTRAST:  171m ISOVUE-300 IOPAMIDOL (ISOVUE-300) INJECTION 61% COMPARISON:  03/23/2018. FINDINGS: Lower chest: Mild dependent subsegmental atelectasis. Trace right pleural effusion. No acute findings at the lung bases. Hepatobiliary: Biliary drainage catheter lies  within the gallbladder. The gallbladder is decompressed. There are persistent gallstones. There is no adjacent inflammation. No extraluminal fluid collection is seen.  Liver is normal in size and attenuation with no masses or focal lesions. No bile duct dilation. Pancreas: Unremarkable. No pancreatic ductal dilatation or surrounding inflammatory changes. Spleen: Normal in size without focal abnormality. Adrenals/Urinary Tract: No adrenal masses. Scarring along the posterior margin of the right kidney the midpole. Bilateral renal cortical thinning. 7 mm cyst arises from the left kidney lower pole. No convincing renal collecting system stone. No hydronephrosis. Ureters normal in course and in caliber. Bladder is unremarkable. Stomach/Bowel: Stomach and small bowel are within normal limits. Colon is normal in caliber. There are multiple left colon diverticula most prominent along the sigmoid. No diverticulitis or other colonic inflammatory process. No evidence of appendicitis. Vascular/Lymphatic: Aortic atherosclerosis. No enlarged abdominal or pelvic lymph nodes. Reproductive: Unremarkable. Other: No abdominal wall hernia or abnormality. No abdominopelvic ascites. Musculoskeletal: No fracture or acute finding. No osteoblastic or osteolytic lesions. IMPRESSION: 1. No acute findings within the abdomen or pelvis. 2. Well-positioned biliary drainage catheter. Gallbladder is decompressed with no adjacent inflammatory changes. No pericholecystic fluid collection. 3. Multiple left colon diverticula. No diverticulitis or other bowel inflammatory process. 4. Aortic atherosclerosis. Electronically Signed   By: Lajean Manes M.D.   On: 04/03/2018 16:35   Dg Chest Portable 1 View  Result Date: 04/03/2018 CLINICAL DATA:  Altered mental status. EXAM: PORTABLE CHEST 1 VIEW COMPARISON:  Chest x-ray 03/23/2018. FINDINGS: Stable cardiomegaly. No pulmonary venous congestion. No focal infiltrate. Mild bibasilar subsegmental atelectasis. No pleural effusion or pneumothorax. IMPRESSION: 1.  Stable cardiomegaly. 2.  Mild bibasilar subsegmental atelectasis. Electronically Signed   By: Marcello Moores  Register    On: 04/03/2018 14:21     ASSESSMENT AND PLAN:   84 year old male with history of atrial fibrillation on Coumadin, prostate cancer and essential hypertension who had recent cholecystostomy tube placed who was brought into the hospital due to generalized weakness.  1.  Generalized weakness due to deconditioning  Patient met criteria for Sirs syndrome and was placed on broad-spectrum antibiotics.  He will be empirically discharged on Augmentin  2.  Atrial fibrillation: Coumadin level is 6 and therefore Coumadin has been discontinued for now.  INR needs to be checked on Monday and Coumadin should be adjusted accordingly. He is currently in normal sinus rhythm  3.  Essential hypertension: Continue Norvasc      Management plans discussed with the patient and he is in agreement.  CODE STATUS: dnr  TOTAL TIME TAKING CARE OF THIS PATIENT: 25 minutes.     POSSIBLE D/C today, DEPENDING ON CLINICAL CONDITION.   Lilana Blasko M.D on 04/05/2018 at 11:26 AM  Between 7am to 6pm - Pager - [phone] After 6pm go to [email or web address] - password [email or web address] [facility] [facility]  Office  [phone]  CC: Primary care physician; Adin Hector, MD  Note: This dictation was prepared with Dragon dictation along with smaller phrase technology. Any transcriptional errors that result from this process are unintentional.

## 2018-04-05 NOTE — Evaluation (Signed)
Physical Therapy Evaluation Patient Details Name: [name] MRN: [id number] DOB: 03/15/1926 Today's Date: 04/05/2018   History of Present Illness  presented to ER secondary to generalized weakness, difficulty ambulating; admitted with SIRS.  Of note, patient with recent hospitalization due to sepsis, status post cholecystostomy tube placement (intact this admission).  Clinical Impression  Upon evaluation, patient awake and oriented (intermittently closes eyes due to fatigue); follows simple commands.  Increased time for processing and task initiation.  Globally weak and deconditioned, requiring extensive assist for all functional activities.  Unable to complete bed mobility, transfers or stepping attempts without RW and mod/max physical assist from therapist.  Very unsteady and unsafe; poor endurance/activity tolerance.  Significant functioanl decline evident, even from previous hospitalization approx 1 month prior.  Very high risk for fall and self/caregiver injury.  Anticipate need for +2 assist if OOB transfers or gait attempted. Would benefit from skilled PT to address above deficits and promote optimal return to PLOF; recommend transition to STR upon discharge from acute hospitalization.     Follow Up Recommendations SNF    Equipment Recommendations       Recommendations for Other Services       Precautions / Restrictions Precautions Precautions: Fall Precaution Comments: cholecystostomy tube Restrictions Weight Bearing Restrictions: No      Mobility  Bed Mobility Overal bed mobility: Needs Assistance Bed Mobility: Supine to Sit;Sit to Supine     Supine to sit: Mod assist Sit to supine: Mod assist;Max assist   General bed mobility comments: assist for LE management, truncal elevation  Transfers Overall transfer level: Needs assistance Equipment used: Rolling walker (2 wheeled) Transfers: Sit to/from Stand Sit to Stand: Mod assist;Max assist          General transfer comment: assist for lift off, postural extension and overall standing balance; fatigues quickly, unable to maintain >5-10 seconds per trial  Ambulation/Gait Ambulation/Gait assistance: Mod assist Gait Distance (Feet): 3 Feet Assistive device: Rolling walker (2 wheeled)       General Gait Details: lateral steps towards head of bed, heavy mod assist.  Very unsteady.  Short, shuffling steps, poor balance.  Unsafe to attempt ambulation away from bed; unsafe/unable to attempt without RW and skilled assist (wife unable)  Science writer    Modified Rankin (Stroke Patients Only)       Balance Overall balance assessment: Needs assistance Sitting-balance support: No upper extremity supported;Feet supported Sitting balance-Leahy Scale: Fair Sitting balance - Comments: min assist for postural extension and trunk control   Standing balance support: Bilateral upper extremity supported Standing balance-Leahy Scale: Zero                               Pertinent Vitals/Pain Pain Assessment: No/denies pain    Home Living Family/patient expects to be discharged to:: Private residence Living Arrangements: Spouse/significant other Available Help at Discharge: Family Type of Home: House Home Access: Ramped entrance     Home Layout: One level Home Equipment: Environmental consultant - 2 wheels;Cane - single point;Tub bench      Prior Function Level of Independence: Needs assistance      ADL's / Homemaking Assistance Needed: Spouse performs cleaning tasks and assists pt with sponge baths and dressing as needed, pt able to perform tub transfers with TTB and spouse assist; spouse manages meds and transportation since pt's hip fx in January 2019  Comments: Ambulatory with [facility] for household and limited community mobilization; denies fall history within previous six months     Hand Dominance        Extremity/Trunk Assessment   Upper Extremity  Assessment Upper Extremity Assessment: Generalized weakness(grossly 3-/5 throughout; significantly ecchymosis elbows distally)    Lower Extremity Assessment Lower Extremity Assessment: Generalized weakness(grossly 3-/5 throughout)       Communication   Communication: No difficulties  Cognition Arousal/Alertness: Awake/alert Behavior During Therapy: WFL for tasks assessed/performed;Flat affect Overall Cognitive Status: Within Functional Limits for tasks assessed                                        General Comments      Exercises Other Exercises Other Exercises: Sit/stand x1 with RW, mod/max assist from standard surface height; mod assist +1 from elevated bed surface height.  Extensive assist from standard (home) surface heights. Other Exercises: Attempted lateral scooting edge of bed; unable to lift/clear buttocks for lateral movement due ot generalized weakness   Assessment/Plan    PT Assessment Patient needs continued PT services  PT Problem List Decreased strength;Decreased mobility;Decreased activity tolerance;Decreased balance;Decreased safety awareness;Decreased cognition;Decreased knowledge of use of DME;Decreased skin integrity;Decreased knowledge of precautions       PT Treatment Interventions DME instruction;Functional mobility training;Balance training;Patient/family education;Neuromuscular re-education;Therapeutic activities;Gait training;Therapeutic exercise    PT Goals (Current goals can be found in the Care Plan section)  Acute Rehab PT Goals Patient Stated Goal: to go home today PT Goal Formulation: With patient/family Time For Goal Achievement: 04/25/2018 Potential to Achieve Goals: Fair    Frequency Min 2X/week   Barriers to discharge Decreased caregiver support      Co-evaluation               AM-PAC PT "6 Clicks" Daily Activity  Outcome Measure Difficulty turning over in bed (including adjusting bedclothes, sheets and  blankets)?: Unable Difficulty moving from lying on back to sitting on the side of the bed? : Unable Difficulty sitting down on and standing up from a chair with arms (e.g., wheelchair, bedside commode, etc,.)?: Unable Help needed moving to and from a bed to chair (including a wheelchair)?: Total Help needed walking in hospital room?: Total Help needed climbing 3-5 steps with a railing? : Total 6 Click Score: 6    End of Session Equipment Utilized During Treatment: Gait belt Activity Tolerance: Patient limited by fatigue Patient left: in bed;with call bell/phone within reach;with bed alarm set;with family/visitor present Nurse Communication: Mobility status PT Visit Diagnosis: Muscle weakness (generalized) (M62.81);Difficulty in walking, not elsewhere classified (R26.2)    Time: 1324-4010 PT Time Calculation (min) (ACUTE ONLY): 23 min   Charges:   PT Evaluation $PT Eval Moderate Complexity: 1 Mod PT Treatments $Therapeutic Activity: 8-22 mins       Jatasia Gundrum H. Owens Shark, PT, DPT, NCS 04/05/18, 12:55 PM [phone]

## 2018-04-05 NOTE — Progress Notes (Signed)
Family Meeting Note  Advance Directive:yes  Today a meeting took place with the spouse.and patient  The following clinical team members were present during this meeting:MD  The following were discussed:Patient's diagnosis: Recent cholecystectomy tube Atrial fibrillation on anticoagulation, Patient's progosis: > 12 months and Goals for treatment: DNR  Additional follow-up to be provided: Advanced directives are updated. Patient will be discharged with DNR sheet  Time spent during discussion:16 minutes  [name] Anstead, MD

## 2018-04-05 NOTE — Progress Notes (Signed)
ANTICOAGULATION CONSULT NOTE - Initial Consult  Pharmacy Consult for warfarin Indication: AF  No Known Allergies  Patient Measurements: Height: 5\' 11"  (180.3 cm) Weight: 179 lb 10.8 oz (81.5 kg) IBW/kg (Calculated) : 75.3 Heparin Dosing Weight:   Vital Signs: Temp: 98 F (36.7 C) (09/28 0551) Temp Source: Oral (09/28 0551) BP: 116/54 (09/28 0551) Pulse Rate: 68 (09/28 0551)  Labs: Recent Labs    04/03/18 1354 04/04/18 0258 04/05/18 0542 04/05/18 0830  HGB 11.5* 9.9* 9.6*  --   HCT 33.3* 28.0* 27.6*  --   PLT 298 240 257  --   LABPROT 52.2* 53.5*  --  43.9*  INR 5.87* 6.06*  --  4.70*  CREATININE 1.19 1.09 1.04  --   TROPONINI <0.03  --   --   --     Estimated Creatinine Clearance: 50.3 mL/min (by C-G formula based on SCr of 1.04 mg/dL).   Medical History: Past Medical History:  Diagnosis Date  . Atrial fibrillation ([place])   . Cancer ([place])    PROSTATE  . Dysrhythmia    AFIB  . GERD (gastroesophageal reflux disease)   . Hypertension     Medications:  Infusions:  . sodium chloride    . piperacillin-tazobactam (ZOSYN)  IV 3.375 g (04/05/18 0533)    Assessment: 89 yom cc SIRS vs sepsis presents with gallbladder drain in place. Had been on keflex and metronidazole PTA. Takes VKA for AF but dose does not seem to have been adjusted for DDI and patient presents with supratherapeutic INR. Pharmacy consulted to manage VKA.   DATE INR DOSE 9/26 5.87 Held 9/27 6.06 Held 9/28 4.7 Held  Goal of Therapy:  INR 2-3 Monitor platelets by anticoagulation protocol: Yes   Plan:  INR supratherapeutic. Continue to hold for today and re-evaluate daily.   Laural Benes, Pharm.D., BCPS Clinical Pharmacist 04/05/2018,11:14 AM

## 2018-04-05 NOTE — Progress Notes (Signed)
Discharge instructions given and went over with patient and patients wife at bedside. Prescription reviewed. All questions answered. Patient to discharge home with wife with home health. Awaiting EMS transportation. Madlyn Frankel, RN

## 2018-04-05 NOTE — Progress Notes (Signed)
EMS called for transport to patients home address. Maximiano Lott S, RN  

## 2018-04-05 NOTE — Progress Notes (Signed)
PT Cancellation Note  Patient Details Name: [name] MRN: [id number] DOB: 1926-03-09   Cancelled Treatment:    Reason Eval/Treat Not Completed: Medical issues which prohibited therapy(Patient pending STAT INR re-draw this AM; will hold exertional activity until results received and patient cleared for activity.)   Jadira Nierman H. Owens Shark, PT, DPT, NCS 04/05/18, 9:00 AM [phone]

## 2018-04-05 NOTE — Progress Notes (Signed)
Awaiting INR result for PT eval prior to discharge. Madlyn Frankel, RN

## 2018-04-07 ENCOUNTER — Inpatient Hospital Stay: Payer: PPO | Admitting: Surgery

## 2018-04-07 ENCOUNTER — Telehealth: Payer: Self-pay

## 2018-04-07 NOTE — Telephone Encounter (Signed)
Patient's wife called and says that he husband just got home from the hospital and was diagnosed with sepsis. She is concerned about his gallbladder tube that he still has in. He was supposed to be seen today here in the office but is non ambulatory and she would have to transport him via ambulance to come here. She is concerned that the gallbladder tube is what is causing the sepsis and wants to know when is should be coming out. She would like a call back about this.

## 2018-04-07 NOTE — Telephone Encounter (Signed)
Called patient's wife, [name] to let her know that I was able to speak to Dr. Dahlia Byes in reference to her question. I told her that he stated that her husband is/was sepsis due to the bilary drainage catheter. Dr. Dahlia Byes also wanted me to tell Mrs. [name] that unfortunately he did not recommend for her husband to have surgery done because of his state. Mrs. [name] was not happy to hear but understood Dr. Corlis Leak recommendations. Mrs. [name] had no further questions. Patient's appointment was cancelled per patient's wife request.

## 2018-04-08 LAB — BLOOD GAS, VENOUS
Acid-Base Excess: 2.8 mmol/L — ABNORMAL HIGH (ref 0.0–2.0)
Bicarbonate: 26.1 mmol/L (ref 20.0–28.0)
PCO2 VEN: 35 mmHg — AB (ref 44.0–60.0)
Patient temperature: 37
pH, Ven: 7.48 — ABNORMAL HIGH (ref 7.250–7.430)

## 2018-04-08 LAB — CULTURE, BLOOD (ROUTINE X 2)
Culture: NO GROWTH
Culture: NO GROWTH

## 2018-04-10 ENCOUNTER — Ambulatory Visit: Payer: PPO | Admitting: Physician Assistant

## 2018-04-11 DIAGNOSIS — Z515 Encounter for palliative care: Secondary | ICD-10-CM | POA: Diagnosis not present

## 2018-04-11 DIAGNOSIS — N183 Chronic kidney disease, stage 3 (moderate): Secondary | ICD-10-CM | POA: Diagnosis not present

## 2018-05-09 DEATH — deceased

## 2018-06-04 ENCOUNTER — Encounter: Payer: Self-pay | Admitting: *Deleted

## 2018-11-13 ENCOUNTER — Encounter (INDEPENDENT_AMBULATORY_CARE_PROVIDER_SITE_OTHER): Payer: PPO

## 2018-11-13 ENCOUNTER — Ambulatory Visit (INDEPENDENT_AMBULATORY_CARE_PROVIDER_SITE_OTHER): Payer: PPO | Admitting: Vascular Surgery

## 2020-05-14 IMAGING — CT CT HEAD W/O CM
3 series · 16 of 47 positions shown, 19 images · non-contrast
Comparison: 12/05/2011

CLINICAL DATA: Weakness and confusion.

EXAM:
CT HEAD WITHOUT CONTRAST
TECHNIQUE: Contiguous axial images were obtained from the base of the skull
through the vertex without intravenous contrast.

[Series 2: head wo · axial · 0.41mm/px · z∈[-60,+65]mm · 10 of 31 slices shown, 13 images]
[im 3/31  brain]
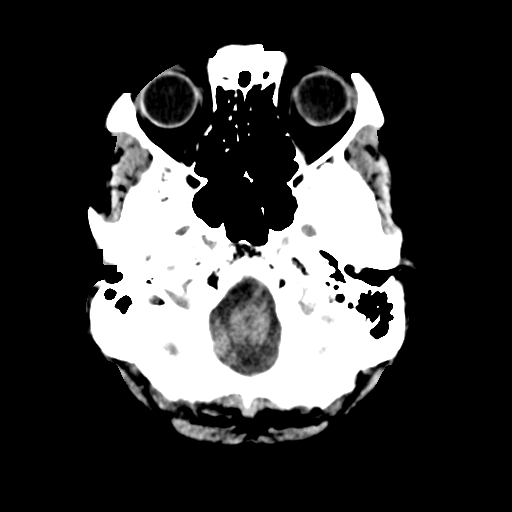
[im 3/31  bone]
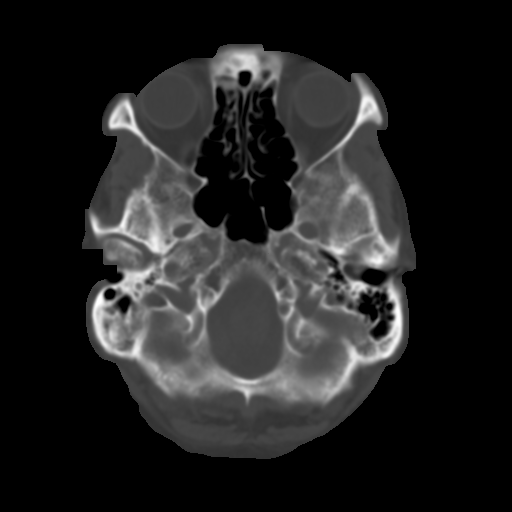
[im 6/31  brain]
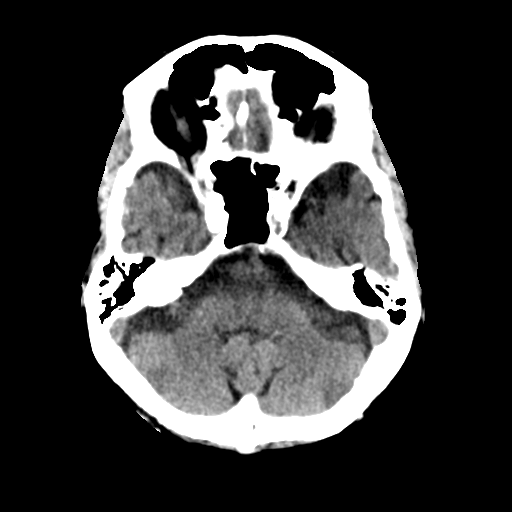
[im 9/31  brain]
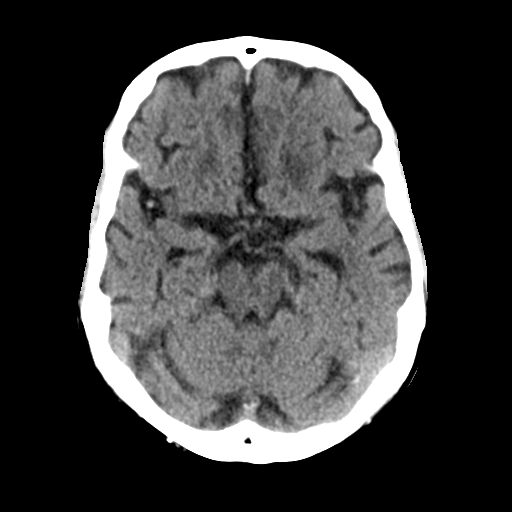
[im 11/31  brain]
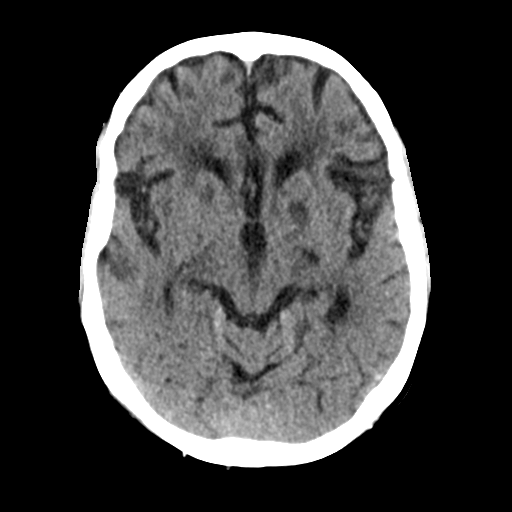
[im 14/31  brain]
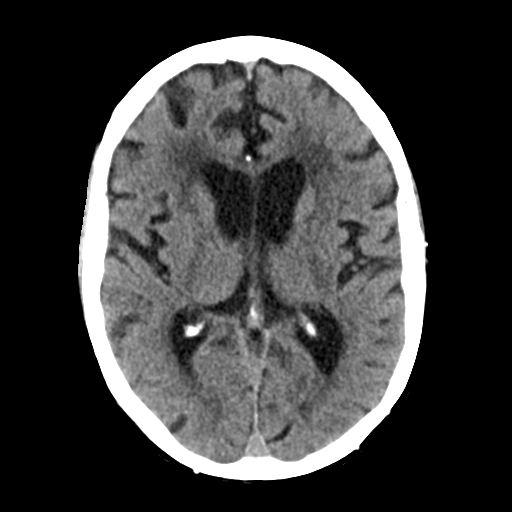
[im 14/31  bone]
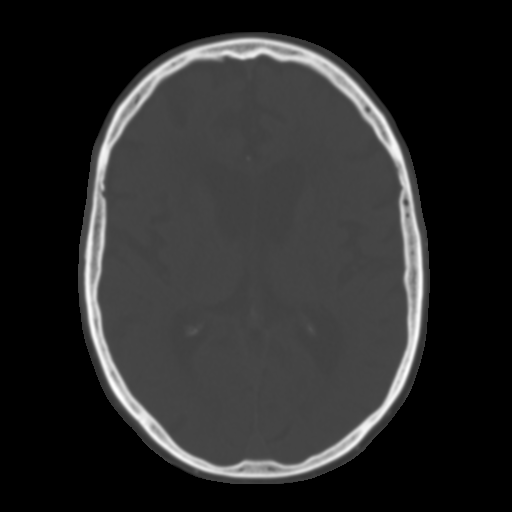
[im 17/31  brain]
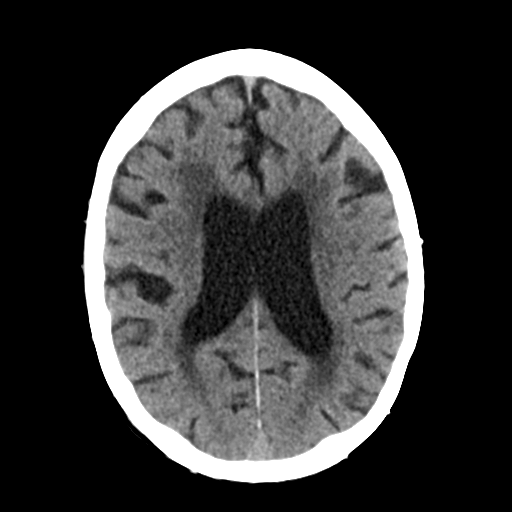
[im 20/31  brain]
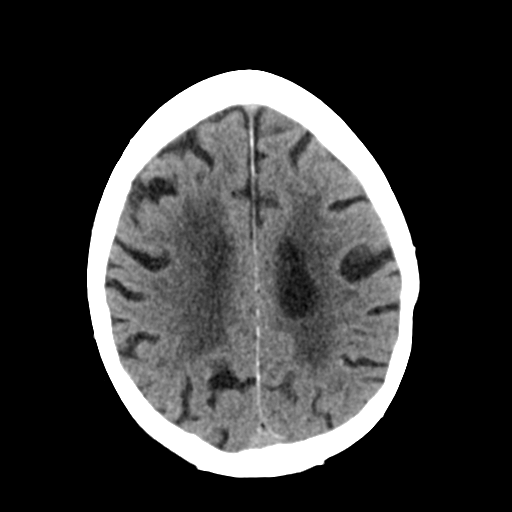
[im 23/31  brain]
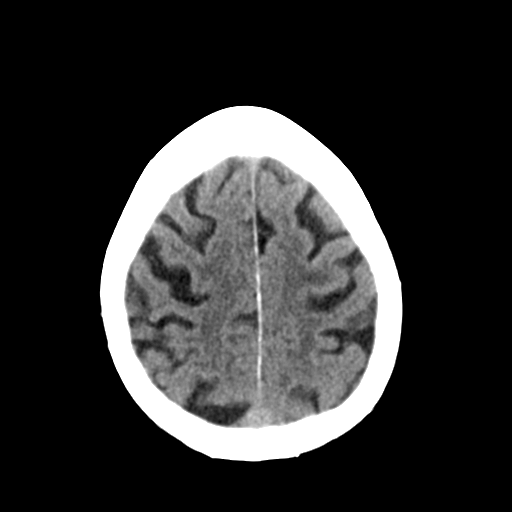
[im 25/31  brain]
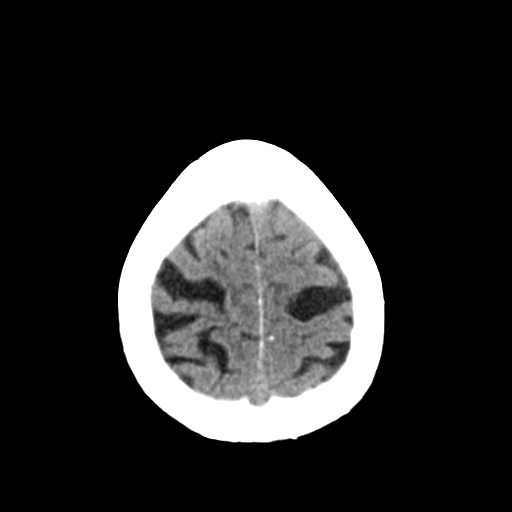
[im 25/31  bone]
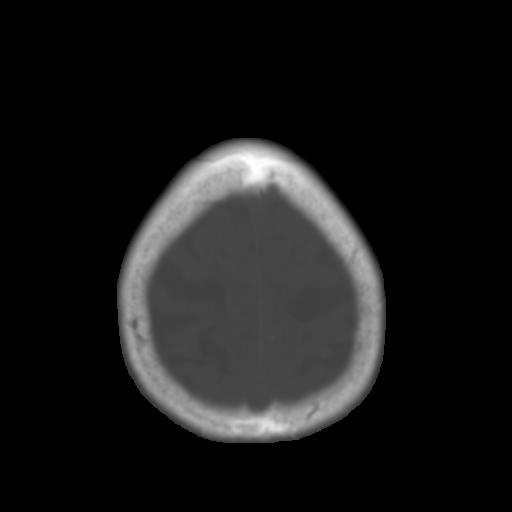
[im 28/31  brain]
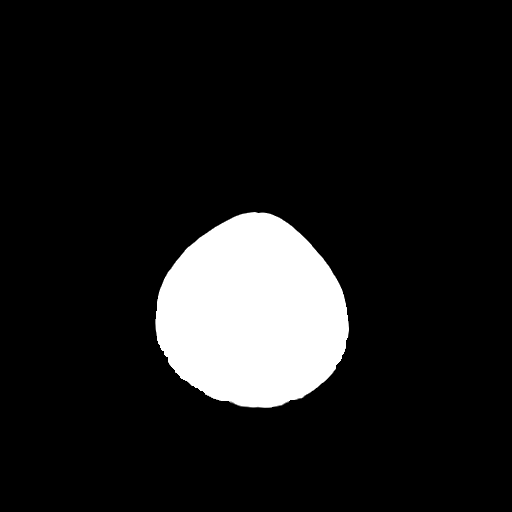

[Series 4: coronal soft tissue · coronal · 0.32mm/px · 3 of 62 slices shown]
[im 21/62  brain]
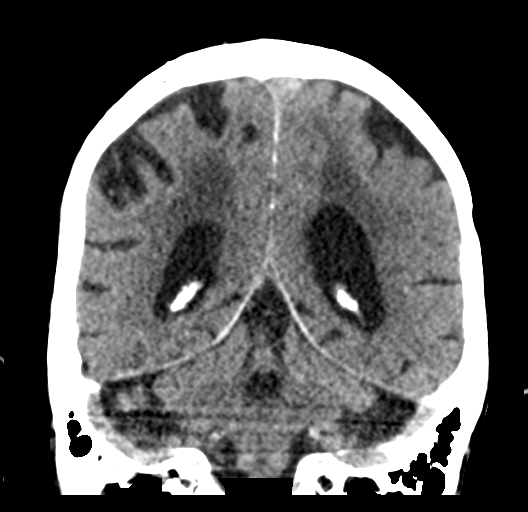
[im 28/62  brain]
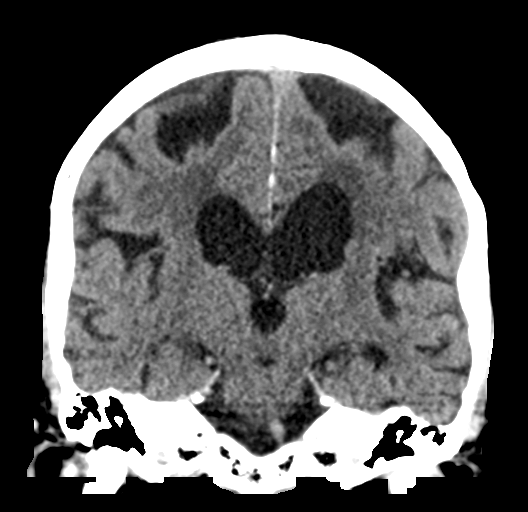
[im 34/62  brain]
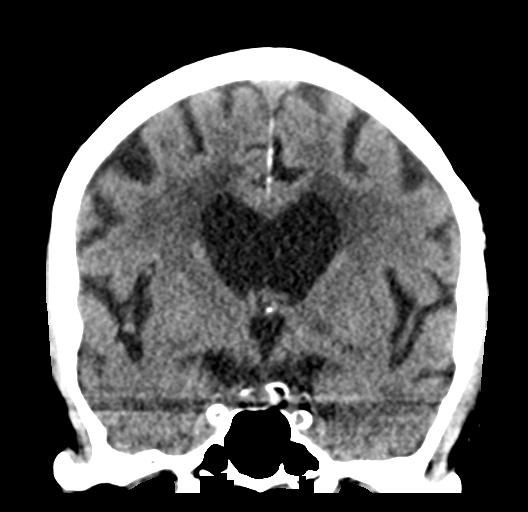

[Series 5: sagittal soft tissue · sagittal · 0.31mm/px · 3 of 49 slices shown]
[im 17/49  brain]
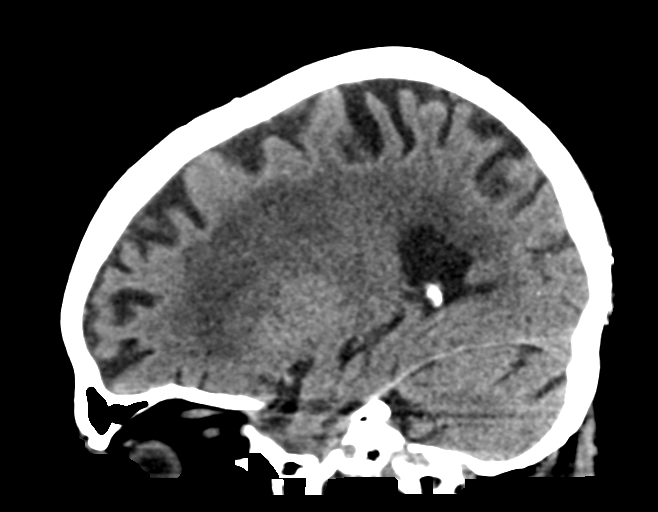
[im 25/49  brain]
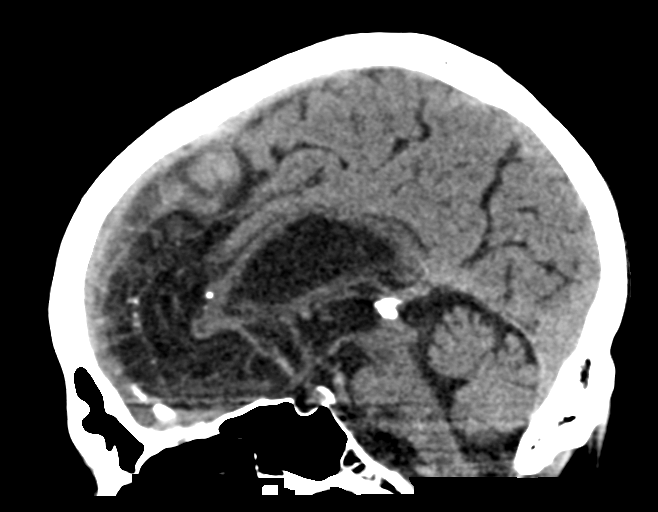
[im 33/49  brain]
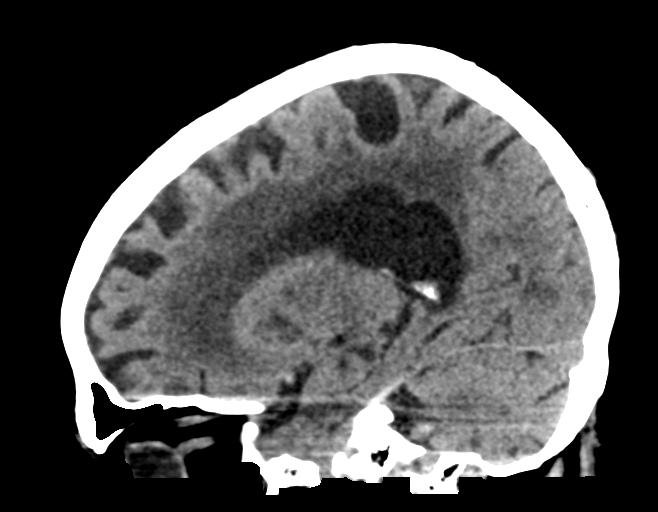

[16 of 47 positions shown; findings below may reference images not displayed]

FINDINGS: Brain: No evidence of acute infarction, hemorrhage, hydrocephalus,
extra-axial collection or mass lesion/mass effect. Moderate brain
parenchymal volume loss and advanced deep white matter
microangiopathy. Lacunar infarcts with chronic appearance in
bilateral basal ganglia.

Vascular: Calcific atherosclerotic disease of the intra cavernous
carotid arteries.

Skull: Normal. Negative for fracture or focal lesion.

Sinuses/Orbits: No acute finding.

Other: Bilateral cataract surgery.
IMPRESSION: Probably chronic bilateral basal ganglia lacunar infarcts.

Moderate brain parenchymal volume loss and advanced deep white
matter microangiopathy.

No evidence of acute intracranial hemorrhage.

## 2020-05-14 IMAGING — CR DG CHEST 2V
1 series · 2 of 2 positions shown · non-contrast
Comparison: 07/15/2017

CLINICAL DATA: Weakness

EXAM:
CHEST - 2 VIEW

[Series 1: w chest lat · 0.14mm/px · 2 of 2 slices shown]
[im 1/2]
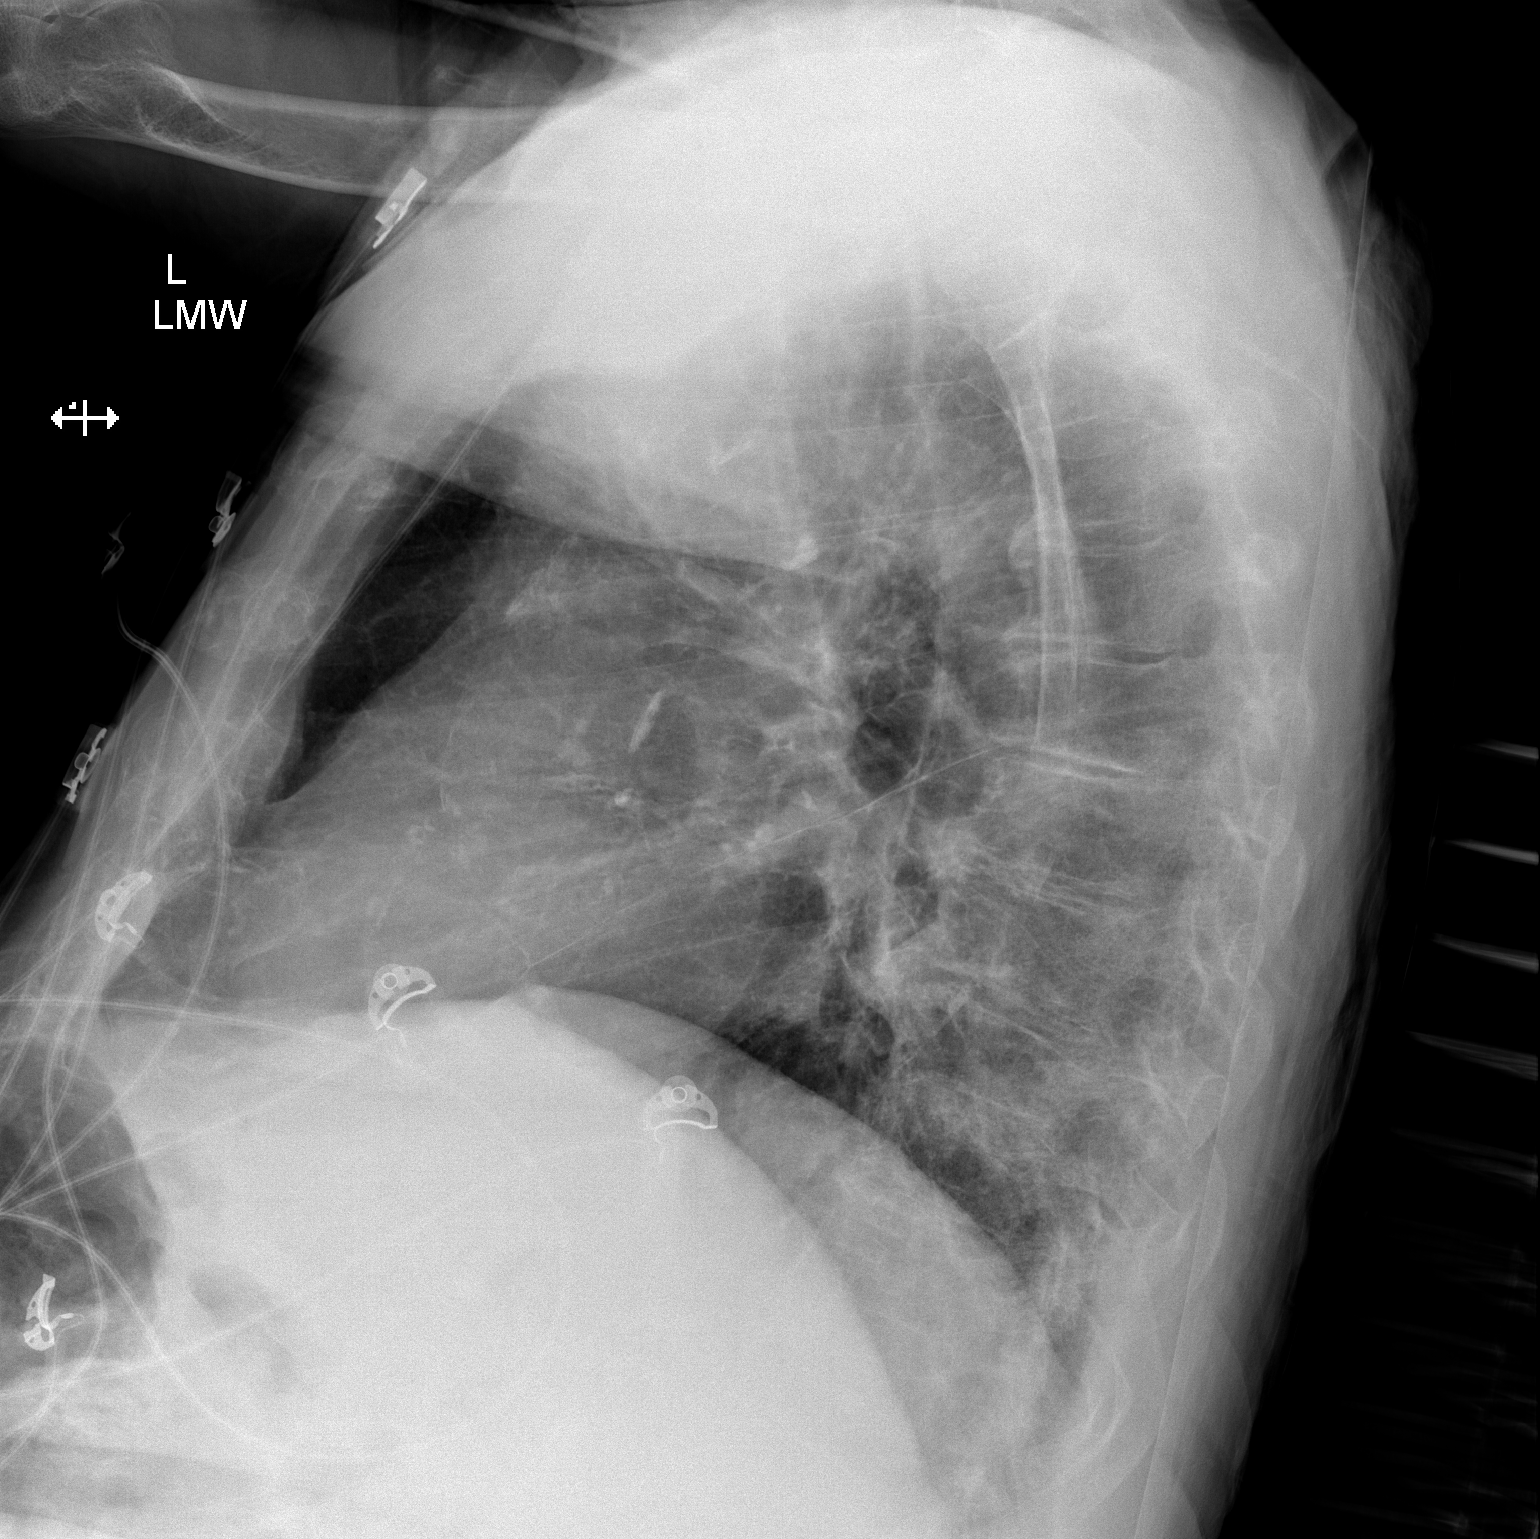
[im 2/2]
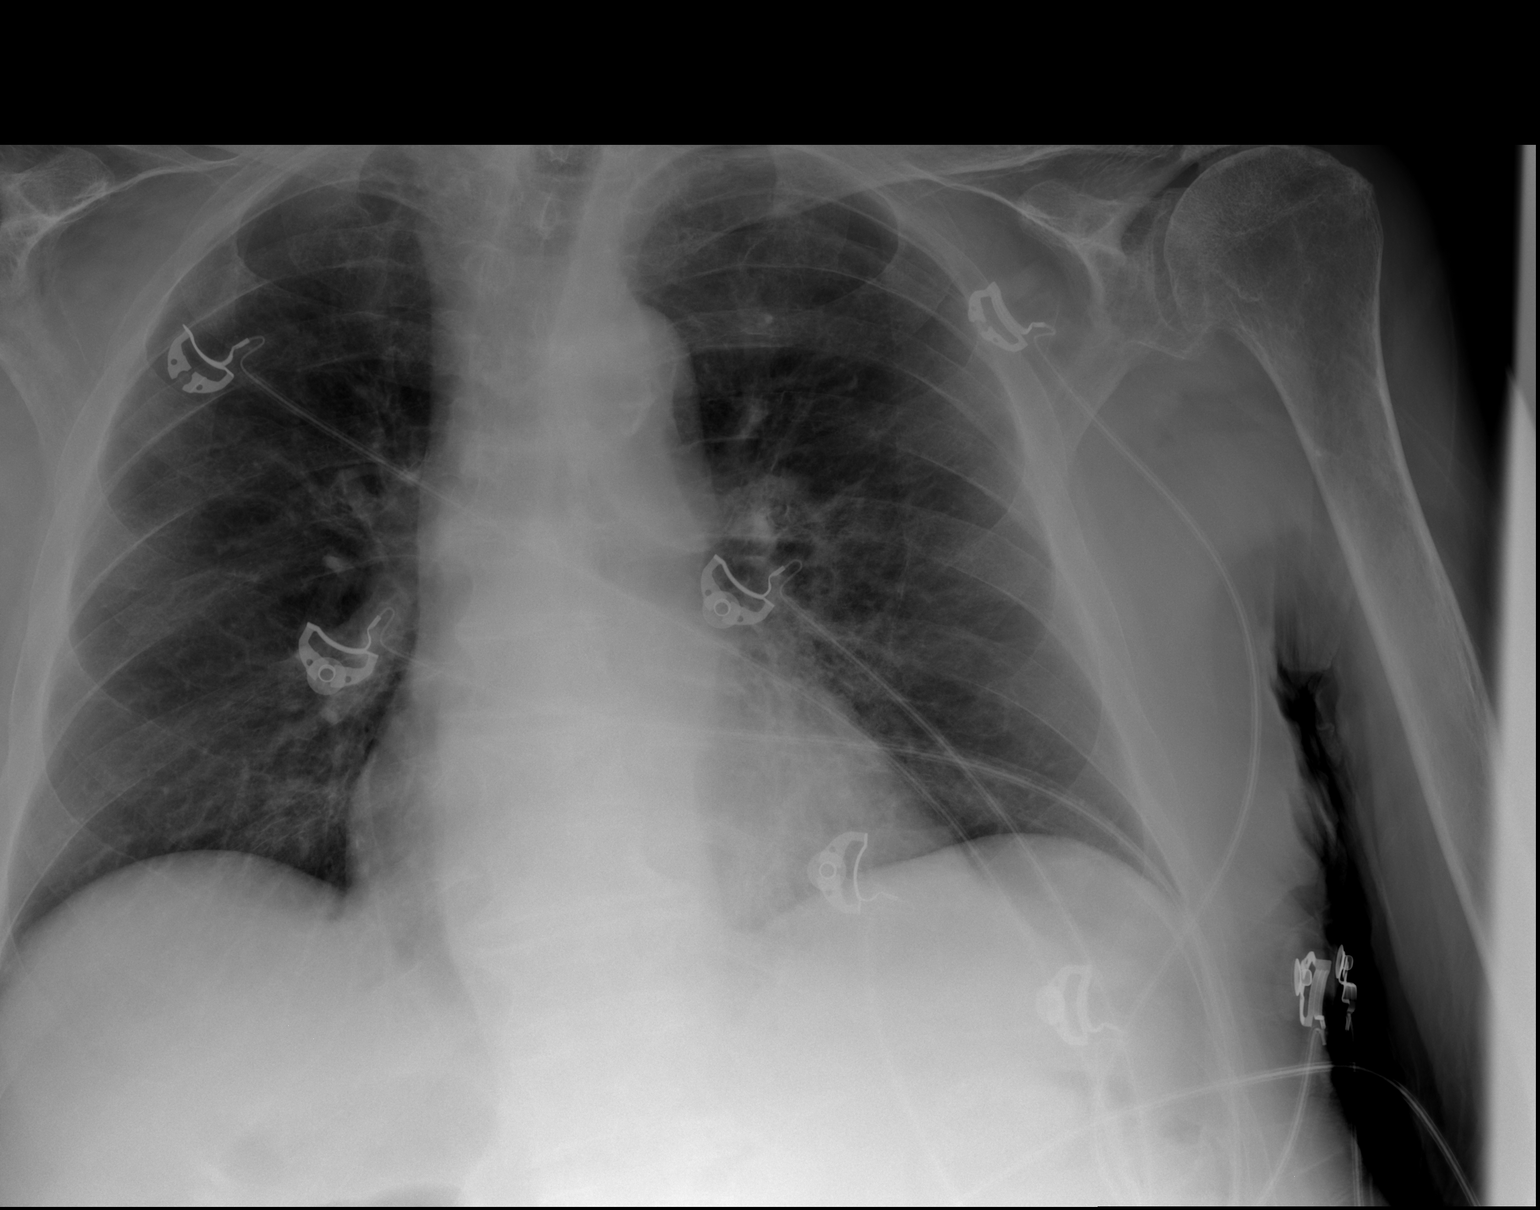

[2 of 2 positions shown; findings below may reference images not displayed]

FINDINGS: No acute opacity or pleural effusion. Stable cardiomediastinal
silhouette with aortic atherosclerosis. No pneumothorax.
Degenerative changes of the spine.
IMPRESSION: No active cardiopulmonary disease.
